# Patient Record
Sex: Female | Born: 1960 | Race: White | Hispanic: No | Marital: Married | Smoking: Former smoker
Health system: Southern US, Community
[De-identification: ages and names within clinical notes are randomized; demographics above are authoritative.]

## PROBLEM LIST (undated history)

## (undated) DIAGNOSIS — K219 Gastro-esophageal reflux disease without esophagitis: Secondary | ICD-10-CM

## (undated) DIAGNOSIS — F32A Depression, unspecified: Secondary | ICD-10-CM

## (undated) DIAGNOSIS — G250 Essential tremor: Secondary | ICD-10-CM

## (undated) DIAGNOSIS — R7303 Prediabetes: Secondary | ICD-10-CM

## (undated) DIAGNOSIS — G8921 Chronic pain due to trauma: Secondary | ICD-10-CM

## (undated) DIAGNOSIS — R569 Unspecified convulsions: Secondary | ICD-10-CM

## (undated) DIAGNOSIS — I10 Essential (primary) hypertension: Secondary | ICD-10-CM

## (undated) DIAGNOSIS — H53009 Unspecified amblyopia, unspecified eye: Secondary | ICD-10-CM

## (undated) DIAGNOSIS — D509 Iron deficiency anemia, unspecified: Secondary | ICD-10-CM

## (undated) DIAGNOSIS — R55 Syncope and collapse: Secondary | ICD-10-CM

## (undated) DIAGNOSIS — E785 Hyperlipidemia, unspecified: Secondary | ICD-10-CM

## (undated) DIAGNOSIS — R2689 Other abnormalities of gait and mobility: Secondary | ICD-10-CM

## (undated) DIAGNOSIS — Z8679 Personal history of other diseases of the circulatory system: Secondary | ICD-10-CM

## (undated) DIAGNOSIS — J101 Influenza due to other identified influenza virus with other respiratory manifestations: Secondary | ICD-10-CM

## (undated) DIAGNOSIS — E039 Hypothyroidism, unspecified: Secondary | ICD-10-CM

## (undated) DIAGNOSIS — M25571 Pain in right ankle and joints of right foot: Secondary | ICD-10-CM

## (undated) DIAGNOSIS — F329 Major depressive disorder, single episode, unspecified: Secondary | ICD-10-CM

## (undated) DIAGNOSIS — M12579 Traumatic arthropathy, unspecified ankle and foot: Secondary | ICD-10-CM

## (undated) DIAGNOSIS — F419 Anxiety disorder, unspecified: Secondary | ICD-10-CM

## (undated) DIAGNOSIS — R0600 Dyspnea, unspecified: Secondary | ICD-10-CM

## (undated) HISTORY — DX: Chronic pain due to trauma: G89.21

## (undated) HISTORY — DX: Essential tremor: G25.0

## (undated) HISTORY — DX: Unspecified convulsions: R56.9

## (undated) HISTORY — DX: Unspecified amblyopia, unspecified eye: H53.009

## (undated) HISTORY — DX: Personal history of other diseases of the circulatory system: Z86.79

## (undated) HISTORY — DX: Major depressive disorder, single episode, unspecified: F32.9

## (undated) HISTORY — DX: Other abnormalities of gait and mobility: R26.89

## (undated) HISTORY — DX: Depression, unspecified: F32.A

## (undated) HISTORY — DX: Hyperlipidemia, unspecified: E78.5

## (undated) HISTORY — DX: Pain in right ankle and joints of right foot: M25.571

---
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
## 1998-12-03 ENCOUNTER — Other Ambulatory Visit: Admission: RE | Admit: 1998-12-03 | Discharge: 1998-12-03 | Payer: Self-pay | Admitting: Family Medicine

## 2001-08-11 DIAGNOSIS — M25571 Pain in right ankle and joints of right foot: Secondary | ICD-10-CM

## 2001-08-11 HISTORY — DX: Pain in right ankle and joints of right foot: M25.571

## 2002-05-04 ENCOUNTER — Inpatient Hospital Stay (HOSPITAL_COMMUNITY): Admission: AC | Admit: 2002-05-04 | Discharge: 2002-05-09 | Payer: Self-pay

## 2002-05-04 ENCOUNTER — Encounter: Payer: Self-pay | Admitting: Emergency Medicine

## 2002-05-05 ENCOUNTER — Encounter: Payer: Self-pay | Admitting: Orthopedic Surgery

## 2003-06-12 HISTORY — PX: ORIF ANKLE FRACTURE: SUR919

## 2004-02-09 HISTORY — PX: ANKLE FUSION: SHX881

## 2004-09-10 ENCOUNTER — Ambulatory Visit: Payer: Self-pay | Admitting: Family Medicine

## 2004-09-19 ENCOUNTER — Ambulatory Visit: Payer: Self-pay | Admitting: Family Medicine

## 2004-09-19 ENCOUNTER — Other Ambulatory Visit: Admission: RE | Admit: 2004-09-19 | Discharge: 2004-09-19 | Payer: Self-pay | Admitting: Internal Medicine

## 2005-04-04 ENCOUNTER — Ambulatory Visit: Payer: Self-pay | Admitting: Family Medicine

## 2006-01-01 ENCOUNTER — Inpatient Hospital Stay (HOSPITAL_COMMUNITY): Admission: RE | Admit: 2006-01-01 | Discharge: 2006-01-02 | Payer: Self-pay | Admitting: Psychiatry

## 2006-01-02 ENCOUNTER — Ambulatory Visit: Payer: Self-pay | Admitting: Psychiatry

## 2006-03-24 ENCOUNTER — Ambulatory Visit: Payer: Self-pay | Admitting: Family Medicine

## 2006-05-04 ENCOUNTER — Encounter: Payer: Self-pay | Admitting: Family Medicine

## 2006-11-05 ENCOUNTER — Ambulatory Visit: Payer: Self-pay | Admitting: Family Medicine

## 2006-11-06 ENCOUNTER — Ambulatory Visit: Payer: Self-pay | Admitting: Family Medicine

## 2006-11-06 LAB — CONVERTED CEMR LAB
BUN: 3 mg/dL — ABNORMAL LOW (ref 6–23)
Basophils Relative: 0.4 % (ref 0.0–1.0)
Bilirubin, Direct: 0.1 mg/dL (ref 0.0–0.3)
CO2: 30 meq/L (ref 19–32)
Cholesterol: 243 mg/dL (ref 0–200)
Direct LDL: 171.8 mg/dL
Eosinophils Relative: 4 % (ref 0.0–5.0)
GFR calc Af Amer: 117 mL/min
Glucose, Bld: 54 mg/dL — ABNORMAL LOW (ref 70–99)
Hemoglobin: 12.7 g/dL (ref 12.0–15.0)
Lymphocytes Relative: 37.8 % (ref 12.0–46.0)
Monocytes Absolute: 0.4 10*3/uL (ref 0.2–0.7)
Monocytes Relative: 8 % (ref 3.0–11.0)
Neutro Abs: 2.4 10*3/uL (ref 1.4–7.7)
Neutrophils Relative %: 49.8 % (ref 43.0–77.0)
Potassium: 3.2 meq/L — ABNORMAL LOW (ref 3.5–5.1)
Sodium: 143 meq/L (ref 135–145)
TSH: 3.67 microintl units/mL (ref 0.35–5.50)
Total Protein: 6.3 g/dL (ref 6.0–8.3)
VLDL: 28 mg/dL (ref 0–40)

## 2006-12-18 ENCOUNTER — Ambulatory Visit: Payer: Self-pay | Admitting: Family Medicine

## 2006-12-18 DIAGNOSIS — E7800 Pure hypercholesterolemia, unspecified: Secondary | ICD-10-CM | POA: Insufficient documentation

## 2006-12-18 DIAGNOSIS — F331 Major depressive disorder, recurrent, moderate: Secondary | ICD-10-CM | POA: Insufficient documentation

## 2007-04-13 ENCOUNTER — Encounter (INDEPENDENT_AMBULATORY_CARE_PROVIDER_SITE_OTHER): Payer: Self-pay | Admitting: *Deleted

## 2007-08-23 ENCOUNTER — Ambulatory Visit: Payer: Self-pay | Admitting: Family Medicine

## 2008-02-17 ENCOUNTER — Ambulatory Visit: Payer: Self-pay | Admitting: Family Medicine

## 2008-02-17 DIAGNOSIS — N3000 Acute cystitis without hematuria: Secondary | ICD-10-CM | POA: Insufficient documentation

## 2008-02-17 LAB — CONVERTED CEMR LAB
Casts: 0 /lpf
Glucose, Urine, Semiquant: NEGATIVE
Mucus, UA: 0
Specific Gravity, Urine: 1.01
Urine crystals, microscopic: 0 /hpf
WBC Urine, dipstick: NEGATIVE
Yeast, UA: 0
pH: 6

## 2008-02-21 ENCOUNTER — Telehealth: Payer: Self-pay | Admitting: Family Medicine

## 2008-07-26 ENCOUNTER — Ambulatory Visit: Payer: Self-pay | Admitting: Family Medicine

## 2008-07-26 LAB — CONVERTED CEMR LAB
Glucose, Urine, Semiquant: NEGATIVE
Specific Gravity, Urine: 1.01
Urobilinogen, UA: 0.2
pH: 7.5

## 2009-06-28 ENCOUNTER — Ambulatory Visit: Payer: Self-pay | Admitting: Family Medicine

## 2009-06-28 ENCOUNTER — Other Ambulatory Visit: Admission: RE | Admit: 2009-06-28 | Discharge: 2009-06-28 | Payer: Self-pay | Admitting: Family Medicine

## 2009-07-04 ENCOUNTER — Encounter (INDEPENDENT_AMBULATORY_CARE_PROVIDER_SITE_OTHER): Payer: Self-pay | Admitting: *Deleted

## 2009-07-04 LAB — CONVERTED CEMR LAB
ALT: 15 units/L (ref 0–35)
AST: 20 units/L (ref 0–37)
Albumin: 4.4 g/dL (ref 3.5–5.2)
Alkaline Phosphatase: 89 units/L (ref 39–117)
Basophils Relative: 0.4 % (ref 0.0–3.0)
Bilirubin, Direct: 0 mg/dL (ref 0.0–0.3)
CO2: 29 meq/L (ref 19–32)
Calcium: 9.3 mg/dL (ref 8.4–10.5)
Creatinine, Ser: 0.7 mg/dL (ref 0.4–1.2)
Eosinophils Relative: 0.9 % (ref 0.0–5.0)
GFR calc non Af Amer: 95.12 mL/min (ref >60)
Hemoglobin: 12.2 g/dL (ref 12.0–15.0)
Lymphocytes Relative: 32.8 % (ref 12.0–46.0)
MCHC: 33.5 g/dL (ref 30.0–36.0)
Monocytes Relative: 6.6 % (ref 3.0–12.0)
Neutro Abs: 3.3 10*3/uL (ref 1.4–7.7)
Neutrophils Relative %: 59.3 % (ref 43.0–77.0)
RBC: 4.09 M/uL (ref 3.87–5.11)
Sodium: 141 meq/L (ref 135–145)
Total CHOL/HDL Ratio: 5
Total Protein: 6.9 g/dL (ref 6.0–8.3)
Triglycerides: 107 mg/dL (ref 0.0–149.0)
VLDL: 21.4 mg/dL (ref 0.0–40.0)
WBC: 5.5 10*3/uL (ref 4.5–10.5)

## 2009-07-09 ENCOUNTER — Encounter (INDEPENDENT_AMBULATORY_CARE_PROVIDER_SITE_OTHER): Payer: Self-pay | Admitting: Internal Medicine

## 2009-07-10 ENCOUNTER — Ambulatory Visit: Payer: Self-pay | Admitting: Family Medicine

## 2009-07-18 ENCOUNTER — Encounter (INDEPENDENT_AMBULATORY_CARE_PROVIDER_SITE_OTHER): Payer: Self-pay | Admitting: Internal Medicine

## 2009-07-18 ENCOUNTER — Encounter (INDEPENDENT_AMBULATORY_CARE_PROVIDER_SITE_OTHER): Payer: Self-pay | Admitting: *Deleted

## 2009-07-31 ENCOUNTER — Emergency Department (HOSPITAL_COMMUNITY): Admission: EM | Admit: 2009-07-31 | Discharge: 2009-07-31 | Payer: Self-pay | Admitting: Family Medicine

## 2009-08-07 DIAGNOSIS — M12579 Traumatic arthropathy, unspecified ankle and foot: Secondary | ICD-10-CM

## 2009-08-07 HISTORY — DX: Traumatic arthropathy, unspecified ankle and foot: M12.579

## 2009-08-09 ENCOUNTER — Ambulatory Visit: Payer: Self-pay | Admitting: Family Medicine

## 2009-08-14 LAB — CONVERTED CEMR LAB
AST: 30 units/L (ref 0–37)
Albumin: 3.7 g/dL (ref 3.5–5.2)
Alkaline Phosphatase: 74 units/L (ref 39–117)
Cholesterol: 224 mg/dL — ABNORMAL HIGH (ref 0–200)
Total Bilirubin: 0.5 mg/dL (ref 0.3–1.2)
Total CHOL/HDL Ratio: 3
VLDL: 21.6 mg/dL (ref 0.0–40.0)

## 2010-02-13 ENCOUNTER — Ambulatory Visit: Payer: Self-pay | Admitting: Family Medicine

## 2010-02-13 LAB — CONVERTED CEMR LAB
ALT: 12 units/L (ref 0–35)
HDL: 62.8 mg/dL (ref >39.00)
Total CHOL/HDL Ratio: 3
Triglycerides: 118 mg/dL (ref 0.0–149.0)
VLDL: 23.6 mg/dL (ref 0.0–40.0)

## 2010-06-25 ENCOUNTER — Ambulatory Visit: Payer: Self-pay | Admitting: Family Medicine

## 2010-06-25 DIAGNOSIS — R42 Dizziness and giddiness: Secondary | ICD-10-CM | POA: Insufficient documentation

## 2010-06-30 DIAGNOSIS — D509 Iron deficiency anemia, unspecified: Secondary | ICD-10-CM | POA: Insufficient documentation

## 2010-06-30 LAB — CONVERTED CEMR LAB
AST: 24 units/L (ref 0–37)
Albumin: 4.6 g/dL (ref 3.5–5.2)
Alkaline Phosphatase: 77 units/L (ref 39–117)
BUN: 8 mg/dL (ref 6–23)
Basophils Absolute: 0 10*3/uL (ref 0.0–0.1)
Chloride: 103 meq/L (ref 96–112)
Creatinine, Ser: 0.8 mg/dL (ref 0.4–1.2)
Eosinophils Relative: 1.4 % (ref 0.0–5.0)
Glucose, Bld: 73 mg/dL (ref 70–99)
Lymphocytes Relative: 35 % (ref 12.0–46.0)
Monocytes Relative: 9.7 % (ref 3.0–12.0)
Neutrophils Relative %: 53.5 % (ref 43.0–77.0)
Platelets: 281 10*3/uL (ref 150.0–400.0)
Potassium: 3.3 meq/L — ABNORMAL LOW (ref 3.5–5.1)
RDW: 18 % — ABNORMAL HIGH (ref 11.5–14.6)
Total Protein: 6.8 g/dL (ref 6.0–8.3)
WBC: 5.2 10*3/uL (ref 4.5–10.5)

## 2010-07-01 ENCOUNTER — Ambulatory Visit: Payer: Self-pay | Admitting: Internal Medicine

## 2010-07-01 LAB — CONVERTED CEMR LAB
Folate: 5.9 ng/mL
Iron: 17 ug/dL — ABNORMAL LOW (ref 42–145)
Potassium: 4.6 meq/L (ref 3.5–5.1)
Vitamin B-12: 210 pg/mL — ABNORMAL LOW (ref 211–911)

## 2010-07-12 ENCOUNTER — Encounter: Payer: Self-pay | Admitting: Family Medicine

## 2010-07-23 ENCOUNTER — Ambulatory Visit: Payer: Self-pay | Admitting: Family Medicine

## 2010-07-23 LAB — CONVERTED CEMR LAB
Ketones, urine, test strip: NEGATIVE
Nitrite: NEGATIVE
Urobilinogen, UA: 0.2

## 2010-07-24 ENCOUNTER — Encounter: Payer: Self-pay | Admitting: Family Medicine

## 2010-07-30 ENCOUNTER — Ambulatory Visit: Payer: Self-pay | Admitting: Family Medicine

## 2010-07-31 ENCOUNTER — Encounter: Payer: Self-pay | Admitting: Family Medicine

## 2010-07-31 LAB — CONVERTED CEMR LAB: Fecal Occult Bld: NEGATIVE

## 2010-08-28 ENCOUNTER — Encounter: Payer: Self-pay | Admitting: Family Medicine

## 2010-09-10 NOTE — Miscellaneous (Signed)
Summary: Plan/[facility]  Plan/[facility]   Imported By: Lester Haworth 07/18/2010 11:04:17  _____________________________________________________________________  External Attachment:    Type:   Image     Comment:   External Document

## 2010-09-10 NOTE — Consult Note (Signed)
Summary: Dr.Catherine [facility] Neurolog[facility],Note  Dr.Catherine [facility] Neurologic,Note   Imported By: Beau Fanny 07/01/2010 15:27:52  _____________________________________________________________________  External Attachment:    Type:   Image     Comment:   External Document

## 2010-09-10 NOTE — Assessment & Plan Note (Signed)
Summary: TRANSFER FROM BILLIE/DIZZY/CLE   Vital Signs:  Patient profile:   50 year old female Height:      65.5 inches Weight:      199.50 pounds BMI:     32.81 Temp:     98.3 degrees F oral Pulse rate:   84 / minute Pulse (ortho):   84 / minute Pulse rhythm:   regular BP sitting:   100 / 80  (left arm) BP standing:   100 / 80 Cuff size:   large  Vitals Entered By: Selena Batten Dance CMA Duncan Dull) (June 25, 2010 3:31 PM)  Serial Vital Signs/Assessments:  Time      Position  BP       Pulse  Resp  Temp     By 3:33 PM   Lying LA  110/82   84                    Kim Dance CMA (AAMA) 3:33 PM   Sitting   102/80   84                    Kim Dance CMA (AAMA) 3:33 PM   Standing  100/80   84                    Kim Dance CMA (AAMA)  CC: Balance issues Comments Falling and stumbling a lot per patient   History of Present Illness: CC: imbalance  dysequilibrium - leading to falls.  Losing balance.  has fallen 4 times in last week.  Today fell at home.  finished doing hair, turned around and lost balance.  no injury, no LOC.  No vertigo, lightheaded/passing out.  No HA.  No fevers/chills.  No abd pain, n/v/d, pain anywhere.  No CP/SOB.  h/o migraines, not anymore.  happened 4 years ago, similar episode, s/p MRIs which were normal at [facility] (not in [place].)  Saw neurologist and told all normal.  doesn't remember who.  h/o R trimalleolar fracture 2003, slightly unsteady since then.  Has been through physical therapy for ankle in past.  on disability for this.  psych - Dr. [name] in [place]. pain - Dr. Murray Hodgkins.  oxycontin 10mg  3 a day.  Current Medications (verified): 1)  Wellbutrin Sr 150 Mg Tb12 (Bupropion Hcl) .... Take 3 By Mouth Daily 2)  Valium 10 Mg Tabs (Diazepam) .... Up To 4  Per Day 3)  Oxycontin 10 Mg Tb12 (Oxycodone Hcl) .... 3 Per Day 4)  Lamictal 150 Mg Tabs (Lamotrigine) .... Take One By Mouth At Bedtime 5)  Calcium Carbonate 600 Mg Tabs (Calcium Carbonate) .Marland Kitchen.. 1 Twice A Day 6)  Aspirin  81 Mg Tbec (Aspirin) .Marland Kitchen.. 1 Daily 7)  Abilify 30 Mg Tabs (Aripiprazole) .... One Tablet At Bedtime 8)  Simvastatin 40 Mg Tabs (Simvastatin) .... Take 1 Each Night For Cholesterol 9)  Prozac 20 Mg Caps (Fluoxetine Hcl) .... 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  NAD Mouth:  Oral mucosa and oropharynx without lesions or exudates.  MMM Neck:  no masses, no thyromegaly. Lungs:  normal respiratory effort, no intercostal retractions, no accessory muscle use, and normal breath sounds.   Heart:  normal rate, regular rhythm, and no murmur.   Neurologic:  CN 2-12 intact, sensation, strength intact.  FTN and HTS intact.  brisk 2+ DTRs bilaterally.  imbalanced gait.  negative romberg, but mild unsteadiness. no pronater drift.  + significant unsteadiness with tandem gait, less with feet side by side but still somewhat present. Skin:  turgor normal, color normal, and no rashes.   Psych:  interactive.  blunted, flat affect.     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  check basic blood work.  given extensive w/u in past, doubt will need rpt MRI.  will try to review old chart ot see what has been done.  RTC 3 wks for f/u.  Discussed how on significant amt psychotropic medications which could  possibly cause dysequilibrium.  advised to call psych Dr. [name] to see if appropriate to try off some meds esp valium.  advised to minimize valium (anxiety) and oxycontin (ankle pain).  set up with balance training PT, ambulation device fitting to see if we can minimize falls while work on getting dysequilibrium improved.    Orders: Physical Therapy Referral (PT) TLB-BMP (Basic Metabolic Panel-BMET) (80048-METABOL) TLB-TSH (Thyroid Stimulating Hormone) (84443-TSH) TLB-Hepatic/Liver Function Pnl (80076-HEPATIC) TLB-CBC Platelet - w/Differential (85025-CBCD)  Complete Medication List: 1)  Wellbutrin Sr 150 Mg Tb12 (Bupropion hcl) .... Take 3 by mouth daily 2)  Valium 10 Mg Tabs (Diazepam) .... Up to 4  per day 3)  Oxycontin 10 Mg Tb12 (Oxycodone hcl) .... 3 per day 4)  Lamictal 150 Mg Tabs (Lamotrigine) .... Take one by mouth at bedtime 5)  Calcium Carbonate 600 Mg Tabs (Calcium carbonate) .Marland Kitchen.. 1 twice a day 6)  Aspirin 81 Mg Tbec (Aspirin) .Marland Kitchen.. 1 daily 7)  Abilify 30 Mg Tabs (Aripiprazole) .... One tablet at bedtime 8)  Simvastatin 40 Mg Tabs (Simvastatin) .... Take 1 each night for cholesterol 9)  Prozac 20 Mg Caps (Fluoxetine hcl) .... 3 by mouth every morning  Patient Instructions: 1)  I think your dysequilibrium is due partly to the ankles as well as some of the medicines you're on. 2)  We will refer you to physical therapy for balance training and to see if you'd benefit from any ambulation assistance device. 3)  Try to use minimal valium and oxycontin. 4)  Consider calling Dr. [name] for sooner appointment to see if there are any changes that can be made to your depression regimen to help with dysequilibrium.   Orders Added: 1)  Physical Therapy Referral [PT] 2)  TLB-BMP (Basic Metabolic Panel-BMET) [80048-METABOL] 3)  TLB-TSH (Thyroid Stimulating Hormone) [84443-TSH] 4)  TLB-Hepatic/Liver Function Pnl [80076-HEPATIC] 5)  TLB-CBC Platelet - w/Differential [85025-CBCD] 6)  Est.  Patient Level IV [[address]]    Current Allergies (reviewed today): No known allergies   Appended Document: old chart input reviewed old chart, C/S from neuro, asked to scan into system.  No records of MRI in system.  would want to obtain records from neuro.  will request records from neuro. [name]  MD  June 30, 2010 5:33 PM   Clinical Lists Changes  Observations: Added new observation of NEUROLOGY MD: Orlin Hilding (06/30/2010 17:15) Added new observation of PSYCHIATRYMD: Kaur (06/30/2010 17:15) Added  new observation of ORTHOPEDMD: Renae Fickle (06/30/2010 17:15)       Habits & Providers     Neurologist: Orlin Hilding     Orthopedist: Renae Fickle     Psychiatrist: [name]  Appended Document: TRANSFER FROM BILLIE/DIZZY/CLE sent for physical therapy and fall prevention program.

## 2010-09-12 NOTE — Miscellaneous (Signed)
Summary: iFOB entry  Clinical Lists Changes  Observations: Added new observation of HEMOCULTDUE: 08/2012 (07/31/2010 8:18) Added new observation of HEMOCCULT: negative (07/30/2010 8:19)      Preventive Care Screening  Hemoccult:    Date:  07/30/2010    Next Due:  08/2012    Results:  negative     Next due at 50 y/o (12-2011)

## 2010-09-12 NOTE — Assessment & Plan Note (Signed)
Summary: 4WK FOLLOW UP / LFW   Vital Signs:  Patient profile:   50 year old female Weight:      204.75 pounds Temp:     98.6 degrees F oral Pulse rate:   84 / minute Pulse rhythm:   regular BP sitting:   116 / 74  (left arm) Cuff size:   large  Vitals Entered By: Selena Batten Dance CMA Duncan Dull) (July 23, 2010 3:13 PM) CC: 4 week follow up and ? UTI Comments Cloudy urine with frequent urination   History of Present Illness: CC: ? UTI, f/u imbalance.  imbalance - PT helping some.  No more near falls or falls.  using cane constantly.  anemia - found to have Hgb 10.6, B12 210, iron low.  on iron and B12 supplements.  not noticing much improvement yet in fatigue.  Periods - since out of work, periods skip, some months heavy, other months light.  No other bleeding elsewhere.  No stool changes.  No weight changes (gained 2 lbs).  Appetite ok.  UTI - feels like starting to get UTI.  increased frequency, cloudy.  No dysuria or urgency.  No abd pain, f/c/n/v, back pain.    depression - hasn't seen Dr. [name] recently.  no recent changes in meds.  Current Medications (verified): 1)  Aspirin 81 Mg Tbec (Aspirin) .Marland Kitchen.. 1 Daily 2)  Oxycontin 10 Mg Tb12 (Oxycodone Hcl) .... 3 Per Day 3)  Wellbutrin Sr 150 Mg Tb12 (Bupropion Hcl) .... Take 3 By Mouth Daily 4)  Prozac 20 Mg Caps (Fluoxetine Hcl) .... 3 By Mouth Every Morning 5)  Abilify 30 Mg Tabs (Aripiprazole) .... One Tablet At Bedtime 6)  Valium 10 Mg Tabs (Diazepam) .... Up To 4  Per Day (Average 2) 7)  Lamictal 150 Mg Tabs (Lamotrigine) .... Take One By Mouth At Bedtime 8)  Simvastatin 40 Mg Tabs (Simvastatin) .... Take 1 Each Night For Cholesterol 9)  Vitamin B-12 1000 Mcg Tabs (Cyanocobalamin) .... Take One Daily 10)  Ferrous Sulfate 325 (65 Fe) Mg Tabs (Ferrous Sulfate) .... Take One Twice Daily 11)  Calcium Carbonate 600 Mg Tabs (Calcium Carbonate) .Marland Kitchen.. 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  1 sister with anemia  No CA, DM, CVA  Social History: no smoking, no EtOH, no rec drugs caffeine: no Marital Status: divorced Children: 1 Occupation: on Sports administrator at [employer]  Review of Systems       per HPI  Physical Exam  General:  alert, well-developed, well-nourished, well-hydrated, and overweight-appearing.  NAD Lungs:  normal respiratory effort, no intercostal retractions, no accessory muscle use, and normal breath sounds.   Heart:  normal rate, regular rhythm, and no murmur.   Abdomen:  Bowel sounds positive,abdomen soft and non-tender without masses, organomegaly or hernias noted.  no CVA tenderness Pulses:  2+ radpulses Extremities:  no pedal edema Psych:  interactive.  blunted, flat affect.     Impression & Recommendations:  Problem # 1:  CYSTITIS, ACUTE (ICD-595.0) UA and micro consistent with UTI.  per patient,  3 d courses not enough.  sent in 7 day course.  Encouraged to push clear liquids, get enough rest, and take acetaminophen as needed. To be seen in 10 days if no improvement, sooner if worse.  Her updated medication list for this problem includes:    Ciprofloxacin Hcl 500 Mg Tabs (Ciprofloxacin hcl) .Marland Kitchen... Take one twice daily for 7 days   Orders: UA Dipstick W/ Micro (manual) ([address]) Specimen Handling ([address]) T-Culture, Urine (60454-09811)  Problem # 2:  ANEMIA, IRON DEFICIENCY (ICD-280.9)  iFOB today.  likely from periods.  continue B12 and FeSO4.  RTC  1-2 mo for CBC recheck. Her updated medication list for this problem includes:    Vitamin B-12 1000 Mcg Tabs (Cyanocobalamin) .Marland Kitchen... Take one daily    Ferrous Sulfate 325 (65 Fe) Mg Tabs (Ferrous sulfate) .Marland Kitchen... 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  discussed with aptient 2 options of referral back to neuro for f/u vs watchful waiting.  likely multifactorial.  (h/o ankle fracture decreased proprioception, multiple psychotropics, narcotic, benzo, ).  reviewed old chart - scanned neuro note, no records of MRI.  could consider weaning off some meds esp valium.  advised to minimize valium (anxiety) and oxycontin (ankle pain).  Problem # 4:  HYPOKALEMIA (ICD-276.8) resolved.  Complete Medication List: 1)  Aspirin 81 Mg Tbec (Aspirin) .Marland Kitchen.. 1 daily 2)  Oxycontin 10 Mg Tb12 (Oxycodone hcl) .... 3 per day 3)  Wellbutrin Sr 150 Mg Tb12 (Bupropion hcl) .... Take 3 by mouth daily 4)  Prozac 20 Mg Caps (Fluoxetine hcl) .... 3 by mouth every morning 5)  Abilify 30 Mg Tabs (Aripiprazole) .... One tablet at bedtime 6)  Valium 10 Mg Tabs (Diazepam) .... Up to 4  per day (average 2) 7)  Lamictal 150 Mg Tabs (Lamotrigine) .... Take one by mouth at bedtime 8)  Simvastatin 40 Mg Tabs (Simvastatin) .... Take 1 each night for cholesterol 9)  Vitamin B-12 1000 Mcg Tabs (Cyanocobalamin) .... Take one daily 10)  Ferrous Sulfate 325 (65 Fe) Mg Tabs (Ferrous sulfate) .... Take one twice daily 11)  Calcium Carbonate 600 Mg Tabs (Calcium carbonate) .Marland Kitchen.. 1 twice a day 12)   Ciprofloxacin Hcl 500 Mg Tabs (Ciprofloxacin hcl) .... Take one twice daily for 7 days  Patient Instructions: 1)  Looks like urinary infection.  treat with cipro 500mg  twice daily for 3 days. 2)  Push fluids, cranberry juice. 3)  If not better, or if any fevers,chills, worsening, let us know. 4)  I'm glad the cane is helping you walk. 5)  Please return in 1 month for recheck blood work  CBC, 280.9 6)  complete stool kit to ensure no blood in stool. Prescriptions: CIPROFLOXACIN HCL 500 MG TABS (CIPROFLOXACIN HCL) take one twice daily for 7 days  #14 x 0   Entered and Authorized by:   [name]  MD   Signed by:   [name]  MD on 07/23/2010   Method used:   Electronically to        [facility]* (retail)  [address]       [place], [place]  [address]       Ph: [phone]       Fax: [phone]   RxID:   [id number] CIPROFLOXACIN HCL 500 MG TABS (CIPROFLOXACIN HCL) take one twice daily for 3 days  #6 x 0   Entered and Authorized by:   [name]  MD   Signed by:   [name]  MD on 07/23/2010   Method used:   Electronically to        [facility]* (retail)       [address]       [place], [place]  [address]       Ph: [phone]       Fax: [phone]   RxID:   [id number]    Orders Added: 1)  UA Dipstick W/ Micro (manual) [[address]] 2)  Specimen Handling [[address]] 3)  T-Culture, Urine [[id number]] 4)  Est. Patient Level IV [[address]]    Current Allergies (reviewed today): No known allergies   Laboratory Results   Urine Tests  Date/Time Received: July 23, 2010  3:21 PM  Date/Time Reported: July 23, 2010 3:21 PM   Routine Urinalysis   Color: straw Appearance: Cloudy Glucose: negative   (Normal Range: Negative) Bilirubin: negative   (Normal Range: Negative) Ketone: negative   (Normal Range: Negative) Spec. Gravity: 1.025   (Normal Range: 1.003-1.035) Blood: large   (Normal Range: Negative) pH: 5.0   (Normal Range:  5.0-8.0) Protein: 30   (Normal Range: Negative) Urobilinogen: 0.2   (Normal Range: 0-1) Nitrite: negative   (Normal Range: Negative) Leukocyte Esterace: large   (Normal Range: Negative)  Urine Microscopic WBC/HPF: TNTC RBC/HPF: 5-10 Bacteria/HPF: 1+ rods Mucous/HPF: no Epithelial/HPF: rare Crystals/HPF: no Casts/LPF: no Yeast/HPF: no    Comments: read by ....................[name]  MD  July 23, 2010 3:49 PM  UCx sent

## 2010-09-12 NOTE — Letter (Signed)
Summary: [facility]: Immunoassay Fecal Occult Blood (iFOB) Order Form  [place] [facility]  [address] [place], [place] [address]   Phone: [phone]  Fax: [phone]      [facility]: Immunoassay Fecal Occult Blood (iFOB) Order Form   July 23, 2010 MRN: [id number]   Jamie Patterson September 06, 1960   Physicican Name:_________________________  Diagnosis Code:________IDA__________________      [name]  MD

## 2010-09-18 NOTE — Miscellaneous (Signed)
Summary: PT [facility] D/C from balance training program - some improvement  Discharged from PT/[facility]   Imported By: Sherian Rein 09/10/2010 12:55:02  _____________________________________________________________________  External Attachment:    Type:   Image     Comment:   External Document  Appended Document: PT [facility] D/C from balance training program - some improvement    Clinical Lists Changes  Observations: Added new observation of PAST MED HX: Hyperlipidemia Depression chronic pain from ankle fracture Imbalance likely multifactorial      s/p balance training program [facility] 08/2010 (09/10/2010 13:06)        Past History:  Past Medical History: Hyperlipidemia Depression chronic pain from ankle fracture Imbalance likely multifactorial      s/p balance training program [facility] 08/2010

## 2010-12-12 ENCOUNTER — Other Ambulatory Visit: Payer: Self-pay

## 2010-12-12 MED ORDER — FERROUS SULFATE 325 (65 FE) MG PO TBEC: 325.0000 mg | DELAYED_RELEASE_TABLET | Freq: Two times a day (BID) | ORAL | Status: DC

## 2010-12-12 NOTE — Telephone Encounter (Signed)
Pt needs to call for appt. 

## 2010-12-24 ENCOUNTER — Encounter: Payer: Self-pay | Admitting: Family Medicine

## 2010-12-25 ENCOUNTER — Encounter: Payer: Self-pay | Admitting: Family Medicine

## 2010-12-25 ENCOUNTER — Ambulatory Visit (INDEPENDENT_AMBULATORY_CARE_PROVIDER_SITE_OTHER): Payer: Medicare Other | Admitting: Family Medicine

## 2010-12-25 VITALS — BP 124/80 | HR 76 | Temp 98.3°F | Wt 209.1 lb

## 2010-12-25 DIAGNOSIS — R350 Frequency of micturition: Secondary | ICD-10-CM

## 2010-12-25 DIAGNOSIS — D509 Iron deficiency anemia, unspecified: Secondary | ICD-10-CM

## 2010-12-25 DIAGNOSIS — N3000 Acute cystitis without hematuria: Secondary | ICD-10-CM

## 2010-12-25 DIAGNOSIS — R42 Dizziness and giddiness: Secondary | ICD-10-CM

## 2010-12-25 LAB — POCT URINALYSIS DIPSTICK
Bilirubin, UA: NEGATIVE
Ketones, UA: NEGATIVE
pH, UA: 6.5

## 2010-12-25 LAB — CBC WITH DIFFERENTIAL/PLATELET
Eosinophils Relative: 0 % (ref 0.0–5.0)
Monocytes Relative: 9.1 % (ref 3.0–12.0)
Neutrophils Relative %: 77 % (ref 43.0–77.0)
Platelets: 199 10*3/uL (ref 150.0–400.0)
WBC: 7.9 10*3/uL (ref 4.5–10.5)

## 2010-12-25 LAB — IBC PANEL
Iron: 41 ug/dL — ABNORMAL LOW (ref 42–145)
Saturation Ratios: 10.7 % — ABNORMAL LOW (ref 20.0–50.0)
Transferrin: 274.7 mg/dL (ref 212.0–360.0)

## 2010-12-25 LAB — FERRITIN: Ferritin: 44.9 ng/mL (ref 10.0–291.0)

## 2010-12-25 MED ORDER — CIPROFLOXACIN HCL 500 MG PO TABS: 500.0000 mg | ORAL_TABLET | Freq: Two times a day (BID) | ORAL | Status: AC

## 2010-12-25 NOTE — Patient Instructions (Signed)
Looks like UTI. Push fluids and rest. Take cipro twice daily for 7days. If not improving as expected give [place] a call. We have checked blood work today to follow iron and anemia.

## 2010-12-25 NOTE — Progress Notes (Signed)
  Subjective:    Patient ID: Jamie Patterson, female    DOB: 1961-03-12, 50 y.o.   MRN: [id number]  HPI CC: UTI?  5d h/o frequency, cloudy urine, some pain with urination but not necessarily dysuria, urgency.  No abd pain, back pain, f/c/n/v.  On azo and cranberry  Last UTI 08/2009 >100k Ecoli S bactrim, cipro.  Also h/o IDA - on iron takes regularly twice daily.  Wonders if needs to continue taking.   Also seen for dysequilibrium last year, sent to [facility] for PT, states significantly improved, using cane when feels imbalanced.  Falls decreased in frequency.  Review of Systems Per HPI    Objective:   Physical Exam  Vitals reviewed. Constitutional: She appears well-developed and well-nourished. No distress.  HENT:  Head: Normocephalic and atraumatic.  Mouth/Throat: Oropharynx is clear and moist. No oropharyngeal exudate.  Cardiovascular: Normal rate, regular rhythm, normal heart sounds and intact distal pulses.   No murmur heard. Pulmonary/Chest: Effort normal and breath sounds normal. No respiratory distress. She has no wheezes.  Abdominal: Soft. Bowel sounds are normal. She exhibits no distension. There is no tenderness.       No CVA tenderness  Musculoskeletal: She exhibits no edema.  Skin: Skin is warm and dry. No rash noted.  Psychiatric: She has a normal mood and affect.          Assessment & Plan:

## 2010-12-25 NOTE — Assessment & Plan Note (Addendum)
UA consistent with UTI. Treat with 7d course cipro (per pt needs longer course) Sent culture.

## 2010-12-25 NOTE — Assessment & Plan Note (Signed)
Found to have IDA.  Reports compliance with bid iron dosing. iFOB negative.  Not heavy bleeding from periods. Check blood work today, update on whether needs to continue iron supplements or not.

## 2010-12-25 NOTE — Assessment & Plan Note (Signed)
Much improved after [facility] PT therapy

## 2010-12-27 NOTE — Discharge Summary (Signed)
NAMESHENIAH, SUPAK                         ACCOUNT NO.:  [id number]   MEDICAL RECORD NO.:  [id number]                   PATIENT TYPE:  INP   LOCATION:  5010                                 FACILITY:  [facility]   PHYSICIAN:  Anise Salvo. Clark, P.A.-C.              DATE OF BIRTH:  1961-02-22   DATE OF ADMISSION:  05/04/2002  DATE OF DISCHARGE:                                 DISCHARGE SUMMARY   DISCHARGE DIAGNOSIS:  Bimalleolar right ankle fracture and right calcaneal  fracture status post fall.   DISCHARGE DIAGNOSIS:  Bimalleolar right ankle fracture and right calcaneal  fracture status post open reduction and internal fixation of the right ankle  plus a left foot third foot proximal phalanx fracture.   SUMMARY:  The patient suffered a fall while at work on May 04, 2002  and was brought to [facility] emergency room by EMS and was  admitted for pain control and stabilization prior to a planned open  reduction and internal fixation of her ankle on the following day,  On  May 05, 2002 she was taken to the operating room where her right  bimalleolar ankle fracture  was repaired.  The distal fibula was repaired  with a Synthes DCP plate and screws, a Dow Miles cable and some bone graft.  The tibial plafond was repaired with 4.5 cannulated screw.  The medial  malleolus was repaired with both 3.5 and 4.5 cannulated screws.  She had a  tourniquet time of 2 hours and 3 minutes; this was performed under general  anesthesia.  Estimated blood loss was 100 cc.  She was taken to the PACU in  stable condition.  On postoperative day one, May 06, 2002 she was in  moderate pain and the morphine was not working well.  She reported that the  Percocet was more effective.  She was having a lot of cramping and spasms in  her leg.  Her vital signs were stable and she was afebrile. The right leg  was intact neurovascularly.  The splint was clean, dry and intact.  The left  forefoot  third toe was ecchymotic over the MTP joint and proximal phalanx  and slightly tender.  Her left low/mid back and flank were ecchymotic and  tender.   LABORATORY DATA:  White count 8.2, hemoglobin 7.9, hematocrit 23.3, platelet  count 221,000.  Her chemistry panel was normal except for a calcium slightly  low at 7.9.  Potassium was borderline at 3.5.   X-rays were reviewed of the left middle toe and showed a proximal phalanx  fracture which was oblique an non-displaced.  On the thoracolumbar films  there was some slight spurring anterior and posterior of vertebral bodies,  but there was no evidence of acute fractures.   The plan was to start her on potassium chloride 20 mEq once a day, put her  on OxyContin 20 mg one q.12h,  discontinue the PCA, write for Dilaudid IV 2  to 3 mg for breakthrough. We wanted her working with the incentive device  and trying coughing and deep breathing.  She was typed and crossed and  transfused two units of blood.  She was given diazepam 5 mg q.8h for  spasming.  On May 07, 2002, postoperative day two, she was taking  fluids well.  The Foley was in place with good drainage.  She was feeling a  lot better after the transfusion and more energetic.  Her vital signs were  stable and she was afebrile.  The back bruise was starting to fade a little  bit.  The splint was intact.  The lower extremity was intact  neurovascularly.  She was allowed out of bed non-weight bearing on the right  foot.  On postoperative day three, May 08, 2002, she was having some  ankle pain.  She had been up in the chair the day before and she was  otherwise doing well. Her temperature maximum was 99.4.  The posterior  splint was intact.  She was neurovascularly intact.  The Foley was  discontinued, the IV was discontinued and she was placed on strictly p.o.  pain control.  The plan was for physical therapy with a plan for sending her  home.  Standard walker and a three in  one were ordered.  On May 09, 2002, postoperative day four, she was resting comfortably in a chair.  She  had been getting around well.  She had been non-weight bearing on her right  foot.  She had minimal pain from her left toe.  She felt ready to go home.  Her vital signs were stable.  Her temperature maximum was 99.4.  Her right  lower extremity is intact neurovascularly and the splint was intact, clean  and dry. The left toe was ecchymotic but still intact neurovascularly and  mildly tender.  The plan was to discharge her home.   ACTIVITY:  She was to continue non-weight bearing on her right foot,  ambulating with a standard walker, ice and elevation to the right foot and  the left foot and weight bearing on the left heel only.   DIET:  Regular.   WOUND CARE:  Keep the splint clean and dry, ice and elevate as needed for  pain or swelling.   SPECIAL INSTRUCTIONS:  Call for increasing pain more drainage or bleeding  from the right foot, any numbness or tingling, any coughing or shortness of  breath and if fever greater than 100.5 or chills.   DISCHARGE MEDICATIONS:  1. Atenolol 100 mg one p.o. q.d.  2. Wellbutrin 200 mg one p.o. b.i.d.  3. Effexor 75 mg two p.o. q.a.m. and one p.o. q.p.m.  4. OxyContin 30 mg one p.o. b.i.d., #10 with no refills, followed by     OxyContin 20 mg one p.o. b.i.d., #10 with no refills.  5. Percocet 325/5 mg one to two p.o. q.4 to 6h p.r.n. breakthrough pain, #40     with no refills.  6. Phenergan 25 mg one p.o. q.6h p.r.n. nausea, #30 with no refills.  7. Robaxin 750 mg one p.o. q.6h p.r.n. spasm, #40 with two refills.  8. Benadryl over the counter 25 mg one or two q.8h p.r.n. itching.  9. Laxative of choice as directed per label.   FOLLOW UP:  Follow-up on Friday, May 20, 2002.  She was instructed to  call for an appointment.   CONDITION ON  DISCHARGE:  Stable.                                              Anise Salvo. Chestine Spore, P.A.-C.     ARC/MEDQ  D:  05/09/2002  T:  05/11/2002  Job:  [id number]

## 2010-12-27 NOTE — H&P (Signed)
NAMEMIOSHA, BEHE                           ACCOUNT NO.:  [id number]   MEDICAL RECORD NO.:  [id number]                   PATIENT TYPE:   LOCATION:                                       FACILITY:  [facility]   PHYSICIAN:  Deidre Ala, M.D.                 DATE OF BIRTH:  1961-05-28   DATE OF ADMISSION:  05/04/2002  DATE OF DISCHARGE:                                HISTORY & PHYSICAL   HISTORY OF PRESENT ILLNESS:  The patient was brought to [facility] Emergency  Room on May 04, 2002, via EMS after suffering a fall at work.  She  fell down a flight of stairs suffering an injury to her right ankle.  She  had immediate pain, deformity, and inability to bear weight.  It started  swelling almost immediately.  Her ankle was splinted, she was stabilized and  brought to the emergency room in stable condition.   PAST AND PRESENT MEDICAL HISTORY:  1. Hypertension controlled with medication.  2. History of migraines.  3. History of depression.   PRESENT MEDICATIONS:  1. Atenolol 100 mg one p.o. q.d.  2. Wellbutrin 200 mg one p.o. b.i.d.  3. Effexor 75 mg two p.o. q.a.m. and one p.o. q.p.m.  4. Ambien 5 mg one-half p.o. q.h.s.   ALLERGIES:  No known drug allergies.   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   REVIEW OF SYSTEMS:  RESPIRATORY:  She denies any congestion, coughing,  shortness of breath.  CARDIOVASCULAR:  Denies chest pain.  GI:  Denies  nausea, vomiting or diarrhea.   PHYSICAL EXAMINATION:  GENERAL:  She is a well-developed, well nourished  adult female who appears to be in no acute distress.  VITAL SIGNS:  Pulse 113, respirations 18, blood pressure 136/98.  HEENT:  Head is normocephalic, atraumatic.  Pupils equal, round, reactive to  light and accommodation.  Extraocular muscles intact.  Mucous membranes pink  and moist.  NECK:  Supple without adenopathy.  CHEST:  Clear to  auscultation.  HEART:  Regular rhythm and rate.  Normal S1 and S2 without murmurs.  ABDOMEN:  Soft and nontender.  Normal active bowel sounds x4.  EXTREMITIES:  Right ankle was neurovascularly intact with a palpable  dorsalis pedal pulse.  Capillary refill at 2 seconds.  The ankle has been  reduced and is in a sugar tong splint at the time of this evaluation.   X-RAYS:  Shows a dislocated bimalleolar right ankle fracture and a tri  planar plafond fracture of the right os calcis.   IMPRESSION:  Bimalleolar right ankle fracture and right calcaneal fracture.   PLAN:  To admit her overnight for pain control and plan for open reduction  internal fixation of her right ankle fracture tomorrow.     Hessie Diener  Maia Breslow, P.A.-C.                    Deidre Ala, M.D.    ARC/MEDQ  D:  05/04/2002  T:  05/06/2002  Job:  [id number]

## 2010-12-27 NOTE — H&P (Signed)
   Jamie Patterson, Jamie Patterson                         ACCOUNT NO.:  [id number]   MEDICAL RECORD NO.:  [id number]                   PATIENT TYPE:  INP   LOCATION:  1824                                 FACILITY:  [facility]   PHYSICIAN:  Deidre Ala, M.D.                 DATE OF BIRTH:  [birth year]   DATE OF ADMISSION:  05/04/2002  DATE OF DISCHARGE:                                HISTORY & PHYSICAL   The patient was brought to [facility] emergency room on May 04, 2002 via EMS after suffering a fall down a flight of stairs at work.  She suffered an injury to her right ankle and had immediate, swelling,  deformity and inability to bear weight.  She was stabilized.  She was  brought to the emergency room in stable condition.     Anise Salvo. Chestine Spore, P.A.-C.                    Deidre Ala, M.D.    ARC/MEDQ  D:  05/04/2002  T:  05/06/2002  Job:  [id number]

## 2010-12-27 NOTE — Op Note (Signed)
Jamie Patterson, Jamie Patterson                         ACCOUNT NO.:  [id number]   MEDICAL RECORD NO.:  [id number]                   PATIENT TYPE:  INP   LOCATION:  5725                                 FACILITY:  [facility]   PHYSICIAN:  Deidre Ala, M.D.                 DATE OF BIRTH:  09-11-1960   DATE OF PROCEDURE:  05/05/2002  DATE OF DISCHARGE:                                 OPERATIVE REPORT   PREOPERATIVE DIAGNOSES:  1. Completely displaced right ankle bimalleolar fracture.  2. Tibial plafond fracture, displaced, triplane type.  3. Incidental tuberosity of os calcis fracture, nondisplaced.   POSTOPERATIVE DIAGNOSES:  1. Completely displaced right ankle bimalleolar fracture.  2. Tibial plafond fracture, displaced, triplane type.  3. Incidental tuberosity of os calcis fracture, nondisplaced.   PROCEDURES:  1. Open reduction and internal fixation of tibial plafond fracture.  2. Open reduction and internal fixation of bimalleolar ankle fracture with     allograft bone grafting of comminuted fibula and cerclage 1.6 Dall-Miles     cable.   SURGEON:  Bradley Ferris, M.D.   ASSISTANT:  Anise Salvo. Clark, P.A.-C.   ANESTHESIA:  General endotracheal.   CULTURES:  None.   DRAINS:  None.   ESTIMATED BLOOD LOSS:  350 cc.   REPLACEMENT:  Without.   TOURNIQUET TIME:  2 hours and 3 minutes.   PATHOLOGIC FINDINGS AND HISTORY:  The patient is a Diplomatic Services operational officer at [employer] who fell coming down some metal steps sustaining this eversion  injury.  The ankle was displaced but closed, somewhat tented medially.  We  reduced her in the ER in a Quigley type fashion with plantar flexion  inversion and placed her in well-padded splints.  Today in the operating  room, the ankle was moderately but not excessively swollen medially.  She  did have a fracture blister over the area of tenting.  The skin was not  broken down or compromised but there was a fracture blister there.  We were  able to  make our incision distal to the site.  We did a very meticulous  closure at the end somewhat loose and we then fixed her with three medial  malleolar type 4.5 cannulated cancellous screws and one 3.5 for the  posterior corner.  It was a very small fragment and had the ligament on it.  I then repaired the deltoid ligament also taking a tuck in it medially.  The  plafond fracture was affixed with two 4.5 cannulated cancellous screws  slightly obliquely and the fibula with a contour Recon-type DCP plate with  distal buttress with cancellous screws x two and the cerclage band at the  fracture site to hold bone fragments and comminution, spanning two holes  open at the fracture site there where she was comminuted, and bone grafted  there.  One fragment we could not additionally capture with an additional  Dall-Miles 1.6.  However the mortise was reconstituted.  The distal tib-fib  ligament was on the plafond fragment and that held the tibia and fibular  together distally.  Essentially anatomic reduction was obtained on mortise  and lateral view.   PROCEDURE:  With adequate anesthesia obtained, using endotracheal technique,  1 g Ancef given IV prophylaxis and another one at tourniquet let down.  The  right foot was prepped from the toes to the knee in a standard fashion.  After standard prepping and draping, Esmarch exsanguination was used and the  tourniquet was let up to 350 mmHg.  We lysed the fracture blister and then  made a crescentic incision distal with the flap totally proximal, medially,  transverse to the medial malleolus.  An incision was deepened sharply with a  knife.  Hemostasis obtained using the Bovie electrocoagulator.  We then  dissected down to the medial malleolar fracture, the small piece noted, but  it was also comminuted.  I removed clot and then irrigated the medial joint  line, took out infolding periosteum and then reduced the medial malleolus  and placed two guide  pins for the 4.5 cannulated cancellous screws and then  placed the cancellous screws in.  The posterior one then cracked through the  posterior portion.  It was comminuted when I tightened it down so I placed  another 3.5, shorter.  Eventually we had to shorten the more posterior of  the screws because it was a bit too long on the lateral view but this  adjusted it appropriately.  We then irrigated this wound and closed it with  2-0 Vicryl and interrupted 4-0 nylon.   Attention was then turned to the lateral side and a lateral incision was  made.  Dissection was carried down to the anterolateral tibia as well as the  fibula fracture.  Clot evacuated.  We then reduced and smoothed down the  triplane plafond-type fracture with two 4.5 cannulated cancellous screws.  I  then applied traction to the fibula and opened up the area of comminution  and lengthened it and then put on the contoured plate and sequentially  screwed on either side of it until we had four bicortical screws proximal,  two bicortical screws distal and two unicortical screws distal for buttress  all the way to the tip of the fibula and then we put a cerclage band around  the comminuted area with bone graft and tightened it down and crimped it.  I  tried to get a second one but I could not capture the additional fragment  medially.  C-arm fluoroscopy confirmed positioning on mortise and lateral  view.  The lateral wound was irrigated and closed meticulously in layers of  2-0 and 3-0 Vicryl and vertical mattress sutures of 4-0 nylon with simple  interrupteds.  The bulky sterile compressive dressing was applied with  multiple padding and U stirrups with Webril between each layer so they would  not stick, as well as a posterior splint in neutral position, all cradling  the foot and ankle in neutral position with Webril between the plaster layer so that it could expand, and an Ace wrap.  The tourniquet had been let down  prior to  final closure on the lateral side.  The patient then was awakened,  having tolerated the procedure well, and taken to the recovery room in  satisfactory condition.  She was given 10 mg of Decadron IV to reduce  swelling in the postoperative phase and  will be placed in a sling in  traction which will hold her toes higher than the knee and the knee higher  than the heart.  She is admitted then for routine postoperative care.                                               Deidre Ala, M.D.    VEP/MEDQ  D:  05/05/2002  T:  05/06/2002  Job:  [id number]   cc:   Deidre Ala, M.D.  [address]  [place], [place] [address]  Fax: [phone]

## 2010-12-28 LAB — URINE CULTURE

## 2011-02-03 ENCOUNTER — Other Ambulatory Visit: Payer: Self-pay | Admitting: *Deleted

## 2011-02-03 MED ORDER — FERROUS SULFATE 325 (65 FE) MG PO TBEC: 325.0000 mg | DELAYED_RELEASE_TABLET | Freq: Two times a day (BID) | ORAL | Status: DC

## 2011-02-03 NOTE — Telephone Encounter (Signed)
Error in documentation

## 2011-02-25 ENCOUNTER — Encounter: Payer: Self-pay | Admitting: Family Medicine

## 2011-02-25 ENCOUNTER — Ambulatory Visit (INDEPENDENT_AMBULATORY_CARE_PROVIDER_SITE_OTHER): Payer: Medicare Other | Admitting: Family Medicine

## 2011-02-25 VITALS — BP 124/72 | HR 84 | Temp 98.1°F | Wt 216.1 lb

## 2011-02-25 DIAGNOSIS — M545 Low back pain, unspecified: Secondary | ICD-10-CM

## 2011-02-25 MED ORDER — NAPROXEN 500 MG PO TABS: ORAL_TABLET | ORAL | Status: DC

## 2011-02-25 MED ORDER — CYCLOBENZAPRINE HCL 5 MG PO TABS: 5.0000 mg | ORAL_TABLET | Freq: Three times a day (TID) | ORAL | Status: DC | PRN

## 2011-02-25 NOTE — Patient Instructions (Signed)
Sounds like lumbar strain, stretching exercises provided. Treat with naprosyn with food twice daily for 1 wk then as needed. May use flexeril as well twice daily as needed, may make you sleepy.

## 2011-02-25 NOTE — Assessment & Plan Note (Signed)
Anticipate lumbar strain, likely pulled muscle when mowing lawn. Recommend NSAID, muscle relaxant, stretching exercises from SM pt advisor on lower back pain provided. If not improved, consider steroid course as pt states has tolerated well in past, held off this visit 2/2 h/o mood issues and mult psychotropics she's currently on. Discussed precautions with flexeril and oxycontin.

## 2011-02-25 NOTE — Progress Notes (Signed)
  Subjective:    Patient ID: Jamie Patterson, female    DOB: [birth year], 50 y.o.   MRN: [id number]  HPI CC:  Back pain, ST  3d h/o back pain, started when got off lawnmower.  Mid lower back pain travelling down left leg, then this am pain started on both sides.  Pain characterized as sharp pain.  Has tried heat to back.  Has tried oxycontin, didn't help.  Takes oxycontin for right ankle pain s/p trimalleolar fracture.  Usually doesn't need to take.  No h/o back issues in past.  No fevers/chills, saddle anestheisa, bowel/bladder accidents, denies inciting trauma to back.  No dysuria, urgency, abd pain, n/v.  Has also had ST.  No fevers/chills, coughing, RN, congestion, itchy eyes.  Wonders if could have allergies.  Review of Systems Per HPI    Objective:   Physical Exam  Vitals reviewed. Constitutional: She appears well-developed and well-nourished. No distress.  HENT:  Head: Normocephalic and atraumatic.  Musculoskeletal:       Lumbar back: She exhibits tenderness (midline upper lumbar).       + mild paraspinous tightness, + muscle spasm L hip on exam Neg SLR test. No pain with int/ext rotation at hips. + SIJ pain bilaterally, unable to check FABER 2/2 stiffness. No GTB pain.  Neurological: No sensory deficit.  Reflex Scores:      Patellar reflexes are 2+ on the right side and 2+ on the left side.      Achilles reflexes are 2+ on the right side and 2+ on the left side.      Assessment & Plan:

## 2011-03-04 ENCOUNTER — Other Ambulatory Visit: Payer: Self-pay | Admitting: *Deleted

## 2011-03-04 MED ORDER — FERROUS SULFATE 325 (65 FE) MG PO TBEC: 325.0000 mg | DELAYED_RELEASE_TABLET | Freq: Two times a day (BID) | ORAL | Status: DC

## 2011-04-16 ENCOUNTER — Other Ambulatory Visit: Payer: Self-pay | Admitting: *Deleted

## 2011-04-16 MED ORDER — SIMVASTATIN 40 MG PO TABS: 40.0000 mg | ORAL_TABLET | Freq: Every day | ORAL | Status: DC

## 2011-07-17 ENCOUNTER — Encounter: Payer: Self-pay | Admitting: Family Medicine

## 2011-07-17 ENCOUNTER — Ambulatory Visit (INDEPENDENT_AMBULATORY_CARE_PROVIDER_SITE_OTHER): Payer: Medicare Other | Admitting: Family Medicine

## 2011-07-17 DIAGNOSIS — F329 Major depressive disorder, single episode, unspecified: Secondary | ICD-10-CM

## 2011-07-17 DIAGNOSIS — M12579 Traumatic arthropathy, unspecified ankle and foot: Secondary | ICD-10-CM

## 2011-07-17 NOTE — Assessment & Plan Note (Addendum)
Self stopped narcotic (oxycontin 10mg  tid) and benzo (valium 10mg  qid). Main concern was stopping benzo cold [place] but this has been at least 2 1/2 wks ago, seems stable from medical perspective. No psychosis, no hallucinations, no seizures.  + anxiety, tremors but very intermittent per pt. I do not think she needs benzo taper currently. At all costs pt does not want to restart benzos or narcotics. Advised re establish with counselor, will see if she can get in with psych sooner than scheduled 08/2011. Followed by Dr. [name], psych.  Will fax note to her. I have placed a call into Dr. [name] to touch base with her re pt and plan A total of 25 minutes were spent face-to-face with the patient during this encounter and over half of that time was spent on counseling and coordination of care

## 2011-07-17 NOTE — Patient Instructions (Addendum)
Return at your convenience for physical Flu shot at pharmacy. I'm glad you're doing well today. The increased anxiety can be expected after coming off so many medicines. Let's give this more time. I do think we have given valium enough time to get out of your system. If you start having worsening temperature changes, fever, worsening tremor, please seek care. Otherwise re establish with counselor as well as I would call to see if we can get you in sooner with Dr. [name].  (I will try and touch base with her as well).

## 2011-07-17 NOTE — Progress Notes (Signed)
Subjective:    Patient ID: Jamie Patterson, female    DOB: 04-Mar-1961, 50 y.o.   MRN: [id number]  HPI CC: anxiety, meds  Pleasant 50 yo with h/o depression >anxiety, chronic right ankle pain s/p trimalleolar fx and fusion.  Presents with sister in law, [place].  3 wks ago thinks may have accidentally doubled up on oxycontin and flexeril, this led her to be out of comission for 6 days in bed.  Since Friday after thanksgiving, has had no narcotics or benzos.  Does not want to restart.  Worried about having been addicted.  Has significant family support - she has signed release form to have sister in law or son at next several office visits with her.  Self stopped previous valium 10mg  qid, oxycodone 10mg  tid and flexeril.  Taking other medicines except abilify which was too expensive.  Currently taking 2 alleve in am, 2 acetaminophen ES 500mg  at noon and 2 alleve in pm.  This is adequately controlling ankle pain.  No fevers/chills, no heat or cold intolerance, abd pain, n/v, bowel changes.    Feeling anxiety attack since 7am this morning, concerned about doctor appt, also went to [employer] and grocery store this morning.  Not used to going out of house.  Bonita Quin mentions Decarla has been like hermit at home for last year, "not participating in life".    Hasn't fallen since day after thanksgiving.  Family has built ramp for her which has helped.  Thinks imbalance may have slightly improved since off benzo/narcotic.  Brings HCPOA form designating Aileen Pilot (son) as HCPOA.  Have asked to scan into system.  Sees Dr. Milagros Evener (psych) for depression > anxiety pain - Dr. Murray Hodgkins. oxycontin 10mg  3 a day. Therapist in [facility], good relationship with him  Wt Readings from Last 3 Encounters:  07/17/11 185 lb 8 oz (84.142 kg)  02/25/11 216 lb 1.9 oz (98.031 kg)  12/25/10 209 lb 1.9 oz (94.856 kg)   Medications and allergies reviewed and updated in chart.  Past histories reviewed  and updated if relevant as below. Patient Active Problem List  Diagnoses  . HYPERCHOLESTEROLEMIA  . DEPRESSION  . CYSTITIS, ACUTE  . ARTHRITIS, TRAUMATIC, ANKLE  . DYSEQUILIBRIUM  . ANEMIA, IRON DEFICIENCY  . Lower back pain   Past Medical History  Diagnosis Date  . HLD (hyperlipidemia)   . Depression     followed by psych  . Chronic pain due to injury     at R ankle due to ankle fracture  . Imbalance     likely multifactorial (s/p balance training at [facility] 08/2010)   Past Surgical History  Procedure Date  . Ankle fusion 02/2004    Right-50% disability   . Orif ankle fracture 06/2003    Right   History  Substance Use Topics  . Smoking status: Former Smoker    Quit date: 08/12/1995  . Smokeless tobacco: Not on file  . Alcohol Use: Yes     Rare-social   Family History  Problem Relation Age of Onset  . Atrial fibrillation Father   . Hypertension Father   . Osteoarthritis Father   . Aortic aneurysm Father   . Depression Mother     V-tach; arrhythmia  . Hepatitis Brother     Hep. C  . Anemia Sister    No Known Allergies Current Outpatient Prescriptions on File Prior to Visit  Medication Sig Dispense Refill  . buPROPion (WELLBUTRIN SR) 150 MG 12 hr tablet  Take 450 mg by mouth daily.        . ferrous sulfate 325 (65 FE) MG EC tablet Take 1 tablet (325 mg total) by mouth 2 (two) times daily.  60 tablet  6  . FLUoxetine (PROZAC) 20 MG capsule Take 60 mg by mouth daily.        Marland Kitchen lamoTRIgine (LAMICTAL) 150 MG tablet Take 150 mg by mouth at bedtime.        . simvastatin (ZOCOR) 40 MG tablet Take 1 tablet (40 mg total) by mouth at bedtime.  30 tablet  3  . vitamin B-12 (CYANOCOBALAMIN) 1000 MCG tablet Take 1,000 mcg by mouth daily.        . ARIPiprazole (ABILIFY) 30 MG tablet Take 30 mg by mouth at bedtime.        Marland Kitchen aspirin 81 MG tablet Take 81 mg by mouth daily.        . calcium carbonate (OS-CAL) 600 MG TABS Take 600 mg by mouth 2 (two) times daily with a meal.        .  cyclobenzaprine (FLEXERIL) 5 MG tablet Take 1 tablet (5 mg total) by mouth every 8 (eight) hours as needed for muscle spasms.  30 tablet  1   Review of Systems Per HPI      Objective:   Physical Exam  Nursing note and vitals reviewed. Constitutional: She appears well-developed and well-nourished. No distress.  HENT:  Head: Normocephalic and atraumatic.  Right Ear: Hearing, tympanic membrane, external ear and ear canal normal.  Left Ear: Hearing, tympanic membrane, external ear and ear canal normal.  Mouth/Throat: Oropharynx is clear and moist. No oropharyngeal exudate.  Eyes: Conjunctivae and EOM are normal. Pupils are equal, round, and reactive to light. No scleral icterus.  Neck: Normal range of motion. Neck supple.  Cardiovascular: Normal rate, regular rhythm, normal heart sounds and intact distal pulses.   No murmur heard. Pulmonary/Chest: Effort normal and breath sounds normal. No respiratory distress. She has no wheezes. She has no rales.  Musculoskeletal: She exhibits no edema.  Lymphadenopathy:    She has no cervical adenopathy.  Neurological: Coordination abnormal.       Slight imbalance with stance/gait, longstanding Resting tremor present  Skin: Skin is warm and dry. No rash noted.  Psychiatric: She has a normal mood and affect.       Assessment & Plan:

## 2011-08-12 HISTORY — PX: OTHER SURGICAL HISTORY: SHX169

## 2011-08-19 ENCOUNTER — Emergency Department (HOSPITAL_COMMUNITY): Payer: Medicare Other

## 2011-08-19 ENCOUNTER — Encounter (HOSPITAL_COMMUNITY): Payer: Self-pay

## 2011-08-19 ENCOUNTER — Inpatient Hospital Stay (HOSPITAL_COMMUNITY): Payer: Medicare Other

## 2011-08-19 ENCOUNTER — Inpatient Hospital Stay (HOSPITAL_COMMUNITY)
Admission: EM | Admit: 2011-08-19 | Discharge: 2011-08-21 | DRG: 101 | Disposition: A | Discharge status: Home / Self Care | Payer: Medicare Other | Attending: Internal Medicine | Admitting: Internal Medicine

## 2011-08-19 DIAGNOSIS — R55 Syncope and collapse: Secondary | ICD-10-CM

## 2011-08-19 DIAGNOSIS — D509 Iron deficiency anemia, unspecified: Secondary | ICD-10-CM

## 2011-08-19 DIAGNOSIS — R42 Dizziness and giddiness: Secondary | ICD-10-CM | POA: Diagnosis present

## 2011-08-19 DIAGNOSIS — E785 Hyperlipidemia, unspecified: Secondary | ICD-10-CM | POA: Diagnosis present

## 2011-08-19 DIAGNOSIS — I10 Essential (primary) hypertension: Secondary | ICD-10-CM | POA: Diagnosis present

## 2011-08-19 DIAGNOSIS — F329 Major depressive disorder, single episode, unspecified: Secondary | ICD-10-CM

## 2011-08-19 DIAGNOSIS — G8929 Other chronic pain: Secondary | ICD-10-CM | POA: Diagnosis present

## 2011-08-19 DIAGNOSIS — Z87891 Personal history of nicotine dependence: Secondary | ICD-10-CM

## 2011-08-19 DIAGNOSIS — R002 Palpitations: Secondary | ICD-10-CM | POA: Diagnosis present

## 2011-08-19 DIAGNOSIS — E7800 Pure hypercholesterolemia, unspecified: Secondary | ICD-10-CM

## 2011-08-19 DIAGNOSIS — Z79899 Other long term (current) drug therapy: Secondary | ICD-10-CM

## 2011-08-19 DIAGNOSIS — M12579 Traumatic arthropathy, unspecified ankle and foot: Secondary | ICD-10-CM

## 2011-08-19 DIAGNOSIS — Z681 Body mass index (BMI) 19 or less, adult: Secondary | ICD-10-CM

## 2011-08-19 DIAGNOSIS — R000 Tachycardia, unspecified: Secondary | ICD-10-CM

## 2011-08-19 DIAGNOSIS — G40909 Epilepsy, unspecified, not intractable, without status epilepticus: Principal | ICD-10-CM | POA: Diagnosis present

## 2011-08-19 DIAGNOSIS — E860 Dehydration: Secondary | ICD-10-CM

## 2011-08-19 DIAGNOSIS — Z7982 Long term (current) use of aspirin: Secondary | ICD-10-CM

## 2011-08-19 DIAGNOSIS — F3289 Other specified depressive episodes: Secondary | ICD-10-CM | POA: Diagnosis present

## 2011-08-19 HISTORY — DX: Essential (primary) hypertension: I10

## 2011-08-19 HISTORY — DX: Syncope and collapse: R55

## 2011-08-19 HISTORY — DX: Iron deficiency anemia, unspecified: D50.9

## 2011-08-19 LAB — URINALYSIS, ROUTINE W REFLEX MICROSCOPIC
Hgb urine dipstick: NEGATIVE
Ketones, ur: NEGATIVE mg/dL
Protein, ur: NEGATIVE mg/dL
Urobilinogen, UA: 0.2 mg/dL (ref 0.0–1.0)

## 2011-08-19 LAB — DIFFERENTIAL
Basophils Absolute: 0 10*3/uL (ref 0.0–0.1)
Basophils Relative: 0 % (ref 0–1)
Eosinophils Absolute: 0.1 10*3/uL (ref 0.0–0.7)
Eosinophils Relative: 1 % (ref 0–5)
Lymphocytes Relative: 11 % — ABNORMAL LOW (ref 12–46)
Monocytes Absolute: 0.8 10*3/uL (ref 0.1–1.0)

## 2011-08-19 LAB — CBC
HCT: 43.4 % (ref 36.0–46.0)
MCHC: 35 g/dL (ref 30.0–36.0)
MCV: 97.1 fL (ref 78.0–100.0)
Platelets: 248 10*3/uL (ref 150–400)
RDW: 12.3 % (ref 11.5–15.5)
WBC: 9.9 10*3/uL (ref 4.0–10.5)

## 2011-08-19 LAB — POCT PREGNANCY, URINE: Preg Test, Ur: NEGATIVE

## 2011-08-19 LAB — BASIC METABOLIC PANEL
Calcium: 9.7 mg/dL (ref 8.4–10.5)
Creatinine, Ser: 0.53 mg/dL (ref 0.50–1.10)
GFR calc Af Amer: >90 mL/min (ref >90)
GFR calc non Af Amer: >90 mL/min (ref >90)
Sodium: 138 mEq/L (ref 135–145)

## 2011-08-19 MED ORDER — SODIUM CHLORIDE 0.9 % IV BOLUS (SEPSIS)
1000.0000 mL | Freq: Once | INTRAVENOUS | Status: AC
  Administered 2011-08-19: 1000 mL via INTRAVENOUS

## 2011-08-19 MED ORDER — METOCLOPRAMIDE HCL 5 MG/ML IJ SOLN
10.0000 mg | Freq: Once | INTRAMUSCULAR | Status: AC
  Administered 2011-08-19: 10 mg via INTRAVENOUS
  Filled 2011-08-19: qty 2

## 2011-08-19 MED ORDER — ASPIRIN EC 81 MG PO TBEC
81.0000 mg | DELAYED_RELEASE_TABLET | Freq: Every evening | ORAL | Status: DC
  Administered 2011-08-19: 81 mg via ORAL
  Filled 2011-08-19 (×3): qty 1

## 2011-08-19 MED ORDER — SODIUM CHLORIDE 0.9 % IV SOLN
INTRAVENOUS | Status: DC
  Administered 2011-08-19 – 2011-08-21 (×4): via INTRAVENOUS

## 2011-08-19 MED ORDER — SENNA 8.6 MG PO TABS
1.0000 | ORAL_TABLET | Freq: Two times a day (BID) | ORAL | Status: DC
  Administered 2011-08-19 – 2011-08-20 (×2): 8.6 mg via ORAL
  Filled 2011-08-19 (×5): qty 1

## 2011-08-19 MED ORDER — KETOROLAC TROMETHAMINE 30 MG/ML IJ SOLN
30.0000 mg | Freq: Once | INTRAMUSCULAR | Status: AC
  Administered 2011-08-19: 30 mg via INTRAVENOUS
  Filled 2011-08-19: qty 1

## 2011-08-19 MED ORDER — SODIUM CHLORIDE 0.9 % IV SOLN: INTRAVENOUS | Status: DC

## 2011-08-19 MED ORDER — BUPROPION HCL ER (XL) 300 MG PO TB24
450.0000 mg | ORAL_TABLET | ORAL | Status: DC
  Administered 2011-08-20 – 2011-08-21 (×2): 450 mg via ORAL
  Filled 2011-08-19 (×3): qty 1

## 2011-08-19 MED ORDER — ONDANSETRON HCL 4 MG PO TABS: 4.0000 mg | ORAL_TABLET | Freq: Four times a day (QID) | ORAL | Status: DC | PRN

## 2011-08-19 MED ORDER — ONDANSETRON HCL 4 MG/2ML IJ SOLN: 4.0000 mg | Freq: Four times a day (QID) | INTRAMUSCULAR | Status: DC | PRN

## 2011-08-19 MED ORDER — ACETAMINOPHEN 650 MG RE SUPP: 650.0000 mg | Freq: Four times a day (QID) | RECTAL | Status: DC | PRN

## 2011-08-19 MED ORDER — LAMOTRIGINE 150 MG PO TABS
150.0000 mg | ORAL_TABLET | ORAL | Status: DC
  Administered 2011-08-20 – 2011-08-21 (×2): 150 mg via ORAL
  Filled 2011-08-19 (×3): qty 1

## 2011-08-19 MED ORDER — ACETAMINOPHEN 325 MG PO TABS
650.0000 mg | ORAL_TABLET | Freq: Four times a day (QID) | ORAL | Status: DC | PRN
  Administered 2011-08-20 – 2011-08-21 (×4): 650 mg via ORAL
  Filled 2011-08-19 (×4): qty 2

## 2011-08-19 MED ORDER — FLUOXETINE HCL 20 MG PO CAPS
80.0000 mg | ORAL_CAPSULE | ORAL | Status: DC
  Administered 2011-08-20 – 2011-08-21 (×2): 80 mg via ORAL
  Filled 2011-08-19 (×3): qty 4

## 2011-08-19 MED ORDER — GADOBENATE DIMEGLUMINE 529 MG/ML IV SOLN
18.0000 mL | Freq: Once | INTRAVENOUS | Status: AC | PRN
  Administered 2011-08-19: 18 mL via INTRAVENOUS

## 2011-08-19 MED ORDER — DOCUSATE SODIUM 100 MG PO CAPS
100.0000 mg | ORAL_CAPSULE | Freq: Two times a day (BID) | ORAL | Status: DC
  Administered 2011-08-19 – 2011-08-20 (×2): 100 mg via ORAL
  Filled 2011-08-19 (×5): qty 1

## 2011-08-19 MED ORDER — SIMVASTATIN 40 MG PO TABS
40.0000 mg | ORAL_TABLET | Freq: Every day | ORAL | Status: DC
  Administered 2011-08-19 – 2011-08-20 (×2): 40 mg via ORAL
  Filled 2011-08-19 (×3): qty 1

## 2011-08-19 NOTE — ED Notes (Signed)
Pt presents with witnessed fall by son while pt was taking ornaments off of Christmas tree.  Pt has no recollection of event.  PIV to L hand is 20g angiocath which is saline locked.  CBG: 103.  Pt is fully immobilized.

## 2011-08-19 NOTE — ED Provider Notes (Signed)
  Physical Exam  BP 144/90  Pulse 149  Temp(Src) 98.9 F (37.2 C) (Oral)  Resp 18  Ht 5\' 7"  (1.702 m)  Wt 180 lb (81.647 kg)  BMI 28.19 kg/m2  SpO2 97%  LMP 08/14/2011  Physical Exam  ED Course  Procedures  MDM Pt received from [place], PA-C.  He would like pt to be hydrated, reassessed and d/c'd home if unremarkable CT head and nml VS.  Pt has received approx 750cc NS bolus.  She continues to be tachycardic at 120bpm.  She c/o right frontal headache and facial pain.   CT head shows advanced white matter changes; no hemorrhage.  30mg  IV toradol and second liter bolus ordered.  Will reassess shortly.  6:00 PM   Pt has had minimal improvement in headache and c/o feeling flushed and shaky.  Mucous membranes dry and HR continues to be 118-120bmp despite 2L bolus.  [facility] consulted for admission for syncope and dehydration.   Syncope may be secondary to cardiac arrhythmia or primary seizure (based on head CT findings).  Dr. Terrace Arabia has been consulted as well and will see pt in hospital.  IV reglan ordered for headache.       Otilio Miu, PA 08/20/11 1440

## 2011-08-19 NOTE — ED Notes (Signed)
[id number] Ready 

## 2011-08-19 NOTE — ED Notes (Signed)
Son at bedside, witnessed event.  He reports that pt was taking ornaments off Christmas tree, screamed out and fell face forward into tree.  He reports pt possible hit her head on window sill, pt fell onto carpet, and began shaking "like a seizure" x 30 seconds.  He reports pt was disoriented right after event.  Pt is alert, has amnesia to event.  Multiple abrasions noted to face and hematoma noted to R forehead.  Pt is not incontinent of urine or feces.  Pt remains on backboard with c-collar intact.

## 2011-08-19 NOTE — ED Notes (Signed)
Pt reports h/o depression, was on valium and oxycontin but stopped taking "cold [place]" around Thanksgiving.  Pt reports taking naproxen BID for pain.

## 2011-08-19 NOTE — H&P (Signed)
PCP:  [name], MD, MD  Confirmed with pt. Jamie Patterson Dr. Milagros Evener (psych) for depression, anxiety Pain - Dr. Murray Hodgkins Therapist in [facility]  Chief Complaint:  Syncope, sudden collapse   HPI: 50yoF with h/o IDA, depression, chronic pain due to R ankle fracture, dysequilibrium presents  with sudden onset syncope and collapse.   Pt can relate history, however son who is not currently available witnessed the event. She was in  her usual state of fairly good health, until noon today when she was taking down the Christmas  tree when she had witnessed collapse and fall into the tree. She denies any prodrome at all -- no  vagal symptoms, no SOB, CP, h/a's, dizziness, lightheadedness, tingling, numbness, focal neuro  deficits, slurred speech, facial droop. There were no sudden positional changes, and she had been  standing the whole time; she denies decreased PO intake or feeling dehydrated beforehand. Per  report, she went down and had jerking motions although this is not substantiated, however the son  who is a firefighter/EMS trained has said he thought it was a seizure. She remember waking up in  her home and talking to an EMS person, knew her son was there, but doesn't know how long she was  out. She denies any h/o seizure activity or anything like this before.   In the ED, pt was tachycardic from 112-140 with stable BP's. Labs significant for normal chem  panel including renal 6/0.53, normal CBC, negative pregnancy test, negative UA. CT head showed  advanced white matter changes -- non specific but can be seen with chronic microvascular  ischemia, demyelinating disease such as MS, vasculitis, complicated migraines or sequelae of  prior infxn or inflammation; mild generalized atrophy advanced for her age, otherwise no acute  abnml. Dr. Terrace Arabia in Neuro was consulted in the ED.   Since arrival to the ED, pt now having bilateral hand and feet tingling and  numbness, and shaking  that was not present before the ED. She is also feeling flushed and very thirsty, dehydrated, having a headache, and nauseous, all not present before ED. Denies abdominal pain, SOB, CP, dysuria. Was in her usual state of health before this. Does give history that in the week before thanksgiving, she possibly took too much of Oxycontin and valium and ended up being in bed for 6 days, after which she stopped taking both cold [place] which she had been taking 9 and 4 years respectively. ROS o/w negative.   Past Medical History  Diagnosis Date  . HLD (hyperlipidemia)   . Depression     followed by psych  . Chronic pain due to injury     at R ankle due to ankle fracture  . Imbalance     likely multifactorial (s/p balance training at [facility] 08/2010)  . Right ankle pain 2003    trimalleolar fx s/p fusion  . Hypertension   . Iron deficiency anemia     Past Surgical History  Procedure Date  . Ankle fusion 02/2004    Right-50% disability   . Orif ankle fracture 06/2003    Right  . Cesarean section    Medications: HOME MEDS: Reconciled with the patient  Prior to Admission medications   Medication Sig Start Date End Date Taking? Authorizing Provider  acetaminophen (TYLENOL) 500 MG tablet Take 500 mg by mouth as needed.    Yes Historical Provider, MD  aspirin EC 81 MG tablet Take 81 mg by mouth every evening.  Yes Historical Provider, MD  buPROPion (WELLBUTRIN XL) 150 MG 24 hr tablet Take 450 mg by mouth every morning.     Yes Historical Provider, MD  calcium-vitamin D (OSCAL WITH D) 500-200 MG-UNIT per tablet Take 1 tablet by mouth every evening.     Yes Historical Provider, MD  ferrous sulfate 325 (65 FE) MG EC tablet Take 1 tablet (325 mg total) by mouth 2 (two) times daily. 03/04/11 03/03/12 Yes [name], MD  FLUoxetine (PROZAC) 40 MG capsule Take 80 mg by mouth every morning.     Yes Historical Provider, MD  lamoTRIgine (LAMICTAL) 150 MG tablet Take 150 mg by  mouth every morning.    Yes Historical Provider, MD  naproxen sodium (ANAPROX) 220 MG tablet Take 440 mg by mouth 2 (two) times daily with a meal.     Yes Historical Provider, MD  OVER THE COUNTER MEDICATION Take 1 capsule by mouth daily. instaflex joint support    Yes Historical Provider, MD  simvastatin (ZOCOR) 40 MG tablet Take 1 tablet (40 mg total) by mouth at bedtime. 04/16/11  Yes [name], MD  vitamin B-12 (CYANOCOBALAMIN) 1000 MCG tablet Take 1,000 mcg by mouth daily.     Yes Historical Provider, MD   Allergies:  No Known Allergies  Social History:   reports that she quit smoking about 16 years ago. She has never used smokeless tobacco. She reports that she drinks alcohol. She reports that she does not use illicit drugs. No caffeine. Divorced. 1 child. 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, although endorses that she falls a lot   Family History: Family History  Problem Relation Age of Onset  . Atrial fibrillation Father   . Hypertension Father   . Osteoarthritis Father   . Aortic aneurysm Father   . Depression Mother     V-tach; arrhythmia  . Hepatitis Brother     Hep. C  . Anemia Sister   . Arrhythmia Mother     Dx with Kirby Funk after having sudden collapse, did NOT have h/o AMI's    Physical Exam: Filed Vitals:   08/19/11 1643 08/19/11 1833 08/19/11 1923 08/19/11 2021  BP: 144/90 126/83 130/87 128/59  Pulse:  112    Temp:    98 F (36.7 C)  TempSrc:      Resp:  16    Height:      Weight:      SpO2: 97% 97% 99% 99%   Blood pressure 128/59, pulse 112, temperature 98 F (36.7 C), temperature source Oral, resp. rate 16, height 5\' 7"  (1.702 m), weight 81.647 kg (180 lb), last menstrual period 08/14/2011, SpO2 99.00%. Gen: Moderately overweight F in ED stretcher with wet towel over head, able to relate her history  well, no distress. Is slightly tremulous appearing in her hands and feet.   HEENT:  PERRL ~3-59mm, constrict well with light, EOMI, sclera clear, no nystagmus. Tongue is quite  dry appearing but no oral lesions noted. Face diffusely flush and red, with abrasions on her  cheek. R upper forehead with hematoma and ecchymoses Lungs: CTAB no w/c/r, normal exam overall Heart: Slightly tachycardic but normal S1/2 without apparent m/g appreciated Abd: Overweight, but soft, NT ND, benign overall Extrem: Average bulk, no increased tone, warm and perfusing. Bilateral feet are in a flexed  position with difficulty extending them. Toes are curled up as well.  Neuro: Alert, conversant, pleasant, oriented. CN2-12 intact, tongue  midline and moves well, no  facial droop or slurred speech. Moving extremities spontaneously with no weakness or deficit  noted for proximal and distal muscle groups. No increased tone. Fine fasciculations noted in arch  of left foot.   Labs & Imaging Results for orders placed during the hospital encounter of 08/19/11 (from the past 48 hour(s))  URINALYSIS, ROUTINE W REFLEX MICROSCOPIC     Status: Normal   Collection Time   08/19/11  3:00 PM      Component Value Range Comment   Color, Urine YELLOW  YELLOW     APPearance CLEAR  CLEAR     Specific Gravity, Urine 1.008  1.005 - 1.030     pH 8.0  5.0 - 8.0     Glucose, UA NEGATIVE  NEGATIVE (mg/dL)    Hgb urine dipstick NEGATIVE  NEGATIVE     Bilirubin Urine NEGATIVE  NEGATIVE     Ketones, ur NEGATIVE  NEGATIVE (mg/dL)    Protein, ur NEGATIVE  NEGATIVE (mg/dL)    Urobilinogen, UA 0.2  0.0 - 1.0 (mg/dL)    Nitrite NEGATIVE  NEGATIVE     Leukocytes, UA NEGATIVE  NEGATIVE  MICROSCOPIC NOT DONE ON URINES WITH NEGATIVE PROTEIN, BLOOD, LEUKOCYTES, NITRITE, OR GLUCOSE <1000 mg/dL.  POCT PREGNANCY, URINE     Status: Normal   Collection Time   08/19/11  3:09 PM      Component Value Range Comment   Preg Test, Ur NEGATIVE     CBC     Status: Abnormal   Collection Time   08/19/11  3:27 PM      Component Value Range Comment     WBC 9.9  4.0 - 10.5 (K/uL)    RBC 4.47  3.87 - 5.11 (MIL/uL)    Hemoglobin 15.2 (*) 12.0 - 15.0 (g/dL)    HCT 47.8  29.5 - 62.1 (%)    MCV 97.1  78.0 - 100.0 (fL)    MCH 34.0  26.0 - 34.0 (pg)    MCHC 35.0  30.0 - 36.0 (g/dL)    RDW 30.8  65.7 - 84.6 (%)    Platelets 248  150 - 400 (K/uL)   DIFFERENTIAL     Status: Abnormal   Collection Time   08/19/11  3:27 PM      Component Value Range Comment   Neutrophils Relative 80 (*) 43 - 77 (%)    Neutro Abs 7.9 (*) 1.7 - 7.7 (K/uL)    Lymphocytes Relative 11 (*) 12 - 46 (%)    Lymphs Abs 1.1  0.7 - 4.0 (K/uL)    Monocytes Relative 8  3 - 12 (%)    Monocytes Absolute 0.8  0.1 - 1.0 (K/uL)    Eosinophils Relative 1  0 - 5 (%)    Eosinophils Absolute 0.1  0.0 - 0.7 (K/uL)    Basophils Relative 0  0 - 1 (%)    Basophils Absolute 0.0  0.0 - 0.1 (K/uL)   BASIC METABOLIC PANEL     Status: Abnormal   Collection Time   08/19/11  3:27 PM      Component Value Range Comment   Sodium 138  135 - 145 (mEq/L)    Potassium 3.5  3.5 - 5.1 (mEq/L)    Chloride 104  96 - 112 (mEq/L)    CO2 24  19 - 32 (mEq/L)    Glucose, Bld 105 (*) 70 - 99 (mg/dL)    BUN 6  6 - 23 (mg/dL)  Creatinine, Ser 0.53  0.50 - 1.10 (mg/dL)    Calcium 9.7  8.4 - 10.5 (mg/dL)    GFR calc non Af Amer >90  >90 (mL/min)    GFR calc Af Amer >90  >90 (mL/min)    Ct Head Wo Contrast  08/19/2011  *RADIOLOGY REPORT*  Clinical Data: Seizure or syncopal episode.  Fall striking head on window cell.  Numerous abrasions to the face.  Headache near the vertex.  CT HEAD WITHOUT CONTRAST  Technique:  Contiguous axial images were obtained from the base of the skull through the vertex without contrast.  Comparison: None.  Findings: Mild generalized atrophy is advanced for age.  There is moderate periventricular subcortical white matter hypoattenuation. There is asymmetric enlargement of the frontal horn the left lateral ventricle with associated white matter change.  No acute cortical infarct,  hemorrhage, or mass lesion is present.  The paranasal sinuses and mastoid air cells are clear.  The osseous skull is intact.  No significant extra cranial soft tissue injury is evident.  IMPRESSION:  1.  Advanced white matter changes. The finding is nonspecific but can be seen in the setting of chronic microvascular ischemia, a demyelinating process such as multiple sclerosis, vasculitis, complicated migraine headaches, or as the sequelae of a prior infectious or inflammatory process. 2.  Mild generalized atrophy is advanced for age. 3.  No acute intracranial abnormality.  Original Report Authenticated By: Jamesetta Orleans. MATTERN, M.D.   ECG: NSR 120 bpm, normal axis, P and PR normal, normal QRS's without Q waves, good RWP, no ST  deviations, flat T's in III/aVF. Overall fairly normal. No prior for comparison  Telemetry: Sinus tachycardia to 120 on arrival that has trended down to 100's -- all sinus.  Impression Present on Admission:  .Syncope and collapse .Tachycardia  50yoF with h/o IDA, depression, chronic pain due to R ankle fracture, dysequilibrium presents  with sudden onset syncope and collapse.   1. Syncopal episode: DDx includes seizure activity vs cardiac dysrhythmia. This seems less likely  to be benign vasovagal or hypovolemic given its sudden onset without any hint of prodrome.   Regarding seizures, she has an abnormal head CT in otherwise healthy F, with differential MS vs  vasculitis (she says she had "rheumatoid nodules" removed from her arm previously but no h/o  classic RA) vs migraines vs prior infxn/inflammation. Interestingly she states she stopped Valium  which she took for 4 yrs about a month ago, so perhaps that had been suppressing a tendency  towards seizures.   Regarding arrhythmias, she states her mom had sudden collapse in her 13's, and was diagnosed with  VTach with no h/o AMI's, thus possibly suggesting a polymorphic VTach vs long QTc syndrome, or  other  familial dysrhythmias in this arena, or even HOCM.   Finally, lamictal and wellbutrin are rarely reported to cause syncope (< 1%).   - Echocardiogram, keep on telemetry, screening Trop x1, would also get her set up with outpatient  cardiac monitoring - MR brain with/without contrast, appreciate Neuro consultation. Routine EEG. Neuro checks  - ANA, RF, ANCA  - Orthostatics and maintenance IVF's  - TSH   2. Tachycardia: Review of telemetry and ECG shows this is benign sinus tach. Likely anxiety  related due to the event. Monitor for now.   3. Psych: continue prozac, lamictal, wellbutrin 4. HTN/HL: continue home ASA 81, statin 5. Holding other non-essential meds   Telemetry, MC team 1 Full code, discussed with pt   Other  plans as per orders.  Osha Rane 08/19/2011, 9:41 PM

## 2011-08-19 NOTE — Consult Note (Signed)
Subjective: She is a 51 y.o. female I am consulted to evaluate  syncopy and abnormal MRI brain.  She has PMHx of depression, on polypharmacy, and disability for it, also with PMhx of HTn, HLD, today at noon, while undo Christmas tree with her son, with out warning signs, she fell face down into the tree, LOC for 10 minutes, with possible seizure like activity, lip injury, but no tongue biting, no incontinence, no similar history in the past.   Now complains of right ankle pain from previous surgery, no lateralized motor sensory deficit, facial bruise.  MRI brain showed multiple subcortical, periventricular white matter disease, some pericallosal involvement. She has hx of migraine, now improved.  Patient Active Problem List  Diagnoses Date Noted  . Syncope and collapse 08/19/2011  . Tachycardia 08/19/2011  . ANEMIA, IRON DEFICIENCY 06/30/2010  . DYSEQUILIBRIUM 06/25/2010  . ARTHRITIS, TRAUMATIC, ANKLE 08/07/2009  . HYPERCHOLESTEROLEMIA 12/18/2006  . DEPRESSION 12/18/2006   Past Medical History  Diagnosis Date  . HLD (hyperlipidemia)   . Depression     followed by psych  . Chronic pain due to injury     at R ankle due to ankle fracture  . Imbalance     likely multifactorial (s/p balance training at [facility] 08/2010)  . Right ankle pain 2003    trimalleolar fx s/p fusion  . Hypertension   . Iron deficiency anemia     Past Surgical History  Procedure Date  . Ankle fusion 02/2004    Right-50% disability   . Orif ankle fracture 06/2003    Right  . Cesarean section     Prescriptions prior to admission  Medication Sig Dispense Refill  . acetaminophen (TYLENOL) 500 MG tablet Take 500 mg by mouth as needed.       Marland Kitchen aspirin EC 81 MG tablet Take 81 mg by mouth every evening.        Marland Kitchen buPROPion (WELLBUTRIN XL) 150 MG 24 hr tablet Take 450 mg by mouth every morning.        . calcium-vitamin D (OSCAL WITH D) 500-200 MG-UNIT per tablet Take 1 tablet by mouth every evening.        . ferrous  sulfate 325 (65 FE) MG EC tablet Take 1 tablet (325 mg total) by mouth 2 (two) times daily.  60 tablet  6  . FLUoxetine (PROZAC) 40 MG capsule Take 80 mg by mouth every morning.        . lamoTRIgine (LAMICTAL) 150 MG tablet Take 150 mg by mouth every morning.       . naproxen sodium (ANAPROX) 220 MG tablet Take 440 mg by mouth 2 (two) times daily with a meal.        . OVER THE COUNTER MEDICATION Take 1 capsule by mouth daily. instaflex joint support       . simvastatin (ZOCOR) 40 MG tablet Take 1 tablet (40 mg total) by mouth at bedtime.  30 tablet  3  . vitamin B-12 (CYANOCOBALAMIN) 1000 MCG tablet Take 1,000 mcg by mouth daily.         No Known Allergies  History  Substance Use Topics  . Smoking status: Former Smoker -- 1.0 packs/day for 20 years    Quit date: 08/12/1995  . Smokeless tobacco: Never Used  . Alcohol Use: Yes     Rare-social    Family History  Problem Relation Age of Onset  . Atrial fibrillation Father   . Hypertension Father   . Osteoarthritis Father   .  Aortic aneurysm Father   . Depression Mother     V-tach; arrhythmia  . Hepatitis Brother     Hep. C  . Anemia Sister   . Arrhythmia Mother     Dx with Kirby Funk after having sudden collapse, did NOT have h/o AMI's      Medications:  Prior to Admission:  Prescriptions prior to admission  Medication Sig Dispense Refill  . acetaminophen (TYLENOL) 500 MG tablet Take 500 mg by mouth as needed.       Marland Kitchen aspirin EC 81 MG tablet Take 81 mg by mouth every evening.        Marland Kitchen buPROPion (WELLBUTRIN XL) 150 MG 24 hr tablet Take 450 mg by mouth every morning.        . calcium-vitamin D (OSCAL WITH D) 500-200 MG-UNIT per tablet Take 1 tablet by mouth every evening.        . ferrous sulfate 325 (65 FE) MG EC tablet Take 1 tablet (325 mg total) by mouth 2 (two) times daily.  60 tablet  6  . FLUoxetine (PROZAC) 40 MG capsule Take 80 mg by mouth every morning.        . lamoTRIgine (LAMICTAL) 150 MG tablet Take 150 mg by mouth every  morning.       . naproxen sodium (ANAPROX) 220 MG tablet Take 440 mg by mouth 2 (two) times daily with a meal.        . OVER THE COUNTER MEDICATION Take 1 capsule by mouth daily. instaflex joint support       . simvastatin (ZOCOR) 40 MG tablet Take 1 tablet (40 mg total) by mouth at bedtime.  30 tablet  3  . vitamin B-12 (CYANOCOBALAMIN) 1000 MCG tablet Take 1,000 mcg by mouth daily.          Review of Systems Complains of depression, no chest pain, no sob, no abdominal pain, mild gait difficulty due to right ankle fusion surgery  Objective: Vital signs in last 24 hours: Temp:  [97.2 F (36.2 C)-98.9 F (37.2 C)] 97.2 F (36.2 C) (01/08 2200) Pulse Rate:  [107-149] 137  (01/08 2211) Resp:  [16-18] 18  (01/08 2200) BP: (126-144)/(59-104) 136/91 mmHg (01/08 2211) SpO2:  [97 %-99 %] 97 % (01/08 2200) Weight:  [53.706 kg (118 lb 6.4 oz)-81.647 kg (180 lb)] 118 lb 6.4 oz (53.706 kg) (01/08 2300)  General Exam:  Cardiac: regular rate rhythm Pulmonary: Clear to ausculation bilaterally Neck: supple, no carotoid bruise  Neurological Exam:  Mentation: alert oriented to time and place, casual conversation and history taking.  CN II-XII: Pupils were  equal reactive to light. Extraocular movement were full. Visual field were full on confrontational test. Facial sensation and strength were normal, with bilateral facial bruise.  Hearing were intact to finger rubbing bilaterally.  Uvula tongue midline. Shoulder shrugging and head turning were normal and symmetric  Motor: normal tone bulk and strength, mild limited ROM of right ankle, and tenderness  Sensory: intact to light touch, pin prick, vibratory sensation and proporioceptions.  Cordination: normal finger to nose, heel to shin, no dysmetria noticed.  Gait: deferred.    Data Review  Lab Result:  Results for orders placed during the hospital encounter of 08/19/11 (from the past 48 hour(s))  URINALYSIS, ROUTINE W REFLEX MICROSCOPIC      Status: Normal   Collection Time   08/19/11  3:00 PM      Component Value Range Comment   Color, Urine YELLOW  YELLOW  APPearance CLEAR  CLEAR     Specific Gravity, Urine 1.008  1.005 - 1.030     pH 8.0  5.0 - 8.0     Glucose, UA NEGATIVE  NEGATIVE (mg/dL)    Hgb urine dipstick NEGATIVE  NEGATIVE     Bilirubin Urine NEGATIVE  NEGATIVE     Ketones, ur NEGATIVE  NEGATIVE (mg/dL)    Protein, ur NEGATIVE  NEGATIVE (mg/dL)    Urobilinogen, UA 0.2  0.0 - 1.0 (mg/dL)    Nitrite NEGATIVE  NEGATIVE     Leukocytes, UA NEGATIVE  NEGATIVE  MICROSCOPIC NOT DONE ON URINES WITH NEGATIVE PROTEIN, BLOOD, LEUKOCYTES, NITRITE, OR GLUCOSE <1000 mg/dL.  POCT PREGNANCY, URINE     Status: Normal   Collection Time   08/19/11  3:09 PM      Component Value Range Comment   Preg Test, Ur NEGATIVE     CBC     Status: Abnormal   Collection Time   08/19/11  3:27 PM      Component Value Range Comment   WBC 9.9  4.0 - 10.5 (K/uL)    RBC 4.47  3.87 - 5.11 (MIL/uL)    Hemoglobin 15.2 (*) 12.0 - 15.0 (g/dL)    HCT 16.1  09.6 - 04.5 (%)    MCV 97.1  78.0 - 100.0 (fL)    MCH 34.0  26.0 - 34.0 (pg)    MCHC 35.0  30.0 - 36.0 (g/dL)    RDW 40.9  81.1 - 91.4 (%)    Platelets 248  150 - 400 (K/uL)   DIFFERENTIAL     Status: Abnormal   Collection Time   08/19/11  3:27 PM      Component Value Range Comment   Neutrophils Relative 80 (*) 43 - 77 (%)    Neutro Abs 7.9 (*) 1.7 - 7.7 (K/uL)    Lymphocytes Relative 11 (*) 12 - 46 (%)    Lymphs Abs 1.1  0.7 - 4.0 (K/uL)    Monocytes Relative 8  3 - 12 (%)    Monocytes Absolute 0.8  0.1 - 1.0 (K/uL)    Eosinophils Relative 1  0 - 5 (%)    Eosinophils Absolute 0.1  0.0 - 0.7 (K/uL)    Basophils Relative 0  0 - 1 (%)    Basophils Absolute 0.0  0.0 - 0.1 (K/uL)   BASIC METABOLIC PANEL     Status: Abnormal   Collection Time   08/19/11  3:27 PM      Component Value Range Comment   Sodium 138  135 - 145 (mEq/L)    Potassium 3.5  3.5 - 5.1 (mEq/L)    Chloride 104  96 - 112  (mEq/L)    CO2 24  19 - 32 (mEq/L)    Glucose, Bld 105 (*) 70 - 99 (mg/dL)    BUN 6  6 - 23 (mg/dL)    Creatinine, Ser 7.82  0.50 - 1.10 (mg/dL)    Calcium 9.7  8.4 - 10.5 (mg/dL)    GFR calc non Af Amer >90  >90 (mL/min)    GFR calc Af Amer >90  >90 (mL/min)                               Radiology Results: Ct Head Wo Contrast  08/19/2011  CT HEAD WITHOUT CONTRAST .  Advanced white matter changes. The finding is nonspecific but can be seen in  the setting of chronic microvascular ischemia, a demyelinating process such as multiple sclerosis, vasculitis, complicated migraine headaches, or as the sequelae of a prior infectious or inflammatory process. 2.  Mild generalized atrophy is advanced for age. 3.  No acute intracranial abnormality.      Assessment/Plan: Jamie Patterson 51 y.o. female presenting with passing out episode, and abnormal MRI brain.  1. Passing out: syncopy, there was possible seizure like activity, EEG, but no anti-epileptic medication now. 2. Abnormal MRI brain: ddx, migraine related small vessel disease, vasculitis, inflammation, even multiple sclerosis, may consider LP.

## 2011-08-19 NOTE — ED Notes (Signed)
Resting quietly in bed. Resp even and unlabored. Complaining of headache 5/10. Family at bedside. Awaitign admission.

## 2011-08-19 NOTE — ED Provider Notes (Signed)
History     CSN: [id number]  Arrival date & time 08/19/11  1332   First MD Initiated Contact with Patient 08/19/11 1342      Chief Complaint  Patient presents with  . Loss of Consciousness    (Consider location/radiation/quality/duration/timing/severity/associated sxs/prior treatment) HPI Comments: Patient presents with witnessed loss of consciousness while taking down ornaments from xmas tree this afternoon.  She reports no prodrome but her son reports her falling face forward into the tree and hitting her head on a window sill before landing on the ground.  Once down she immediately began to shake in upper and lower extremities for 30 seconds.  She has no recollection of the events and began to respond to paramedics approx 5 mins after the syncopal episode.  No prev. hx of seizures or syncope.  No urinary incontinence or lateral tongue biting.  Reports hypertension but that she no longer takes medication.  Denies neck pain, numbness/tingling and weakness in all extremities, palpitations, chest pain, and SOB.  She reports pain from her superficial scrapes on her face/lip.          Patient is a 51 y.o. female presenting with syncope. The history is provided by the patient and a relative.  Loss of Consciousness This is a new problem. The current episode started today. The problem has been resolved. Pertinent negatives include no abdominal pain, chest pain, chills, congestion, coughing, diaphoresis, fever, headaches, myalgias, nausea, neck pain, numbness, rash, sore throat, vertigo, vomiting or weakness. The symptoms are aggravated by nothing. She has tried nothing for the symptoms.    Past Medical History  Diagnosis Date  . HLD (hyperlipidemia)   . Depression     followed by psych  . Chronic pain due to injury     at R ankle due to ankle fracture  . Imbalance     likely multifactorial (s/p balance training at [facility] 08/2010)  . Right ankle pain 2003    trimalleolar fx s/p fusion  .  Hypertension     Past Surgical History  Procedure Date  . Ankle fusion 02/2004    Right-50% disability   . Orif ankle fracture 06/2003    Right    Family History  Problem Relation Age of Onset  . Atrial fibrillation Father   . Hypertension Father   . Osteoarthritis Father   . Aortic aneurysm Father   . Depression Mother     V-tach; arrhythmia  . Hepatitis Brother     Hep. C  . Anemia Sister     History  Substance Use Topics  . Smoking status: Former Smoker    Quit date: 08/12/1995  . Smokeless tobacco: Not on file  . Alcohol Use: Yes     Rare-social    OB History    Grav Para Term Preterm Abortions TAB SAB Ect Mult Living                  Review of Systems  Constitutional: Negative for fever, chills and diaphoresis.  HENT: Negative for nosebleeds, congestion, sore throat, facial swelling, rhinorrhea, neck pain and neck stiffness.   Eyes: Negative for photophobia and discharge.  Respiratory: Negative for cough and shortness of breath.   Cardiovascular: Positive for syncope. Negative for chest pain, palpitations and leg swelling.  Gastrointestinal: Negative for nausea, vomiting, abdominal pain, diarrhea and constipation.  Genitourinary: Negative for dysuria.  Musculoskeletal: Negative for myalgias.  Skin: Negative for pallor and rash.  Neurological: Positive for syncope. Negative for dizziness, vertigo,  seizures, speech difficulty, weakness, numbness and headaches.  Psychiatric/Behavioral: Positive for confusion.    Allergies  Review of patient's allergies indicates no known allergies.  Home Medications   Current Outpatient Rx  Name Route Sig Dispense Refill  . ACETAMINOPHEN 500 MG PO TABS Oral Take 500 mg by mouth as needed.      . ARIPIPRAZOLE 30 MG PO TABS Oral Take 30 mg by mouth at bedtime.      . BUPROPION HCL ER (SR) 150 MG PO TB12 Oral Take 450 mg by mouth daily.      . CYCLOBENZAPRINE HCL 5 MG PO TABS Oral Take 1 tablet (5 mg total) by mouth every  8 (eight) hours as needed for muscle spasms. 30 tablet 1  . FERROUS SULFATE 325 (65 FE) MG PO TBEC Oral Take 1 tablet (325 mg total) by mouth 2 (two) times daily. 60 tablet 6    Pt needs to call for appt.  Marland Kitchen LAMOTRIGINE 150 MG PO TABS Oral Take 150 mg by mouth at bedtime.      Marland Kitchen NAPROXEN SODIUM 220 MG PO TABS Oral Take 440 mg by mouth 2 (two) times daily with a meal.      . SIMVASTATIN 40 MG PO TABS Oral Take 1 tablet (40 mg total) by mouth at bedtime. 30 tablet 3  . VITAMIN B-12 1000 MCG PO TABS Oral Take 1,000 mcg by mouth daily.        BP 140/100  Pulse 121  Temp(Src) 98.9 F (37.2 C) (Oral)  Resp 18  Ht 5\' 7"  (1.702 m)  Wt 180 lb (81.647 kg)  BMI 28.19 kg/m2  SpO2 97%  LMP 08/14/2011  Physical Exam  Nursing note and vitals reviewed. Constitutional: She is oriented to person, place, and time. She appears well-developed and well-nourished.  HENT:  Head: Normocephalic.    Right Ear: External ear normal.  Left Ear: External ear normal.  Mouth/Throat: Oropharynx is clear and moist.       Visible hematoma right frontal, multiple minor abrasions to face.   Eyes: EOM are normal. Pupils are equal, round, and reactive to light.  Neck: Normal range of motion. Neck supple.  Cardiovascular: Normal rate and regular rhythm.   No murmur heard. Pulmonary/Chest: Effort normal and breath sounds normal. She has no wheezes. She exhibits no tenderness.  Abdominal: Soft. Bowel sounds are normal.  Musculoskeletal: Normal range of motion. She exhibits no edema.  Neurological: She is alert and oriented to person, place, and time. No cranial nerve deficit or sensory deficit. She exhibits normal muscle tone. Coordination normal. GCS eye subscore is 4. GCS verbal subscore is 5. GCS motor subscore is 6.  Skin: Skin is warm and dry. No erythema.  Psychiatric: She has a normal mood and affect.    ED Course  Procedures (including critical care time)  Labs Reviewed  CBC - Abnormal; Notable for the  following:    Hemoglobin 15.2 (*)    All other components within normal limits  DIFFERENTIAL - Abnormal; Notable for the following:    Neutrophils Relative 80 (*)    Neutro Abs 7.9 (*)    Lymphocytes Relative 11 (*)    All other components within normal limits  BASIC METABOLIC PANEL - Abnormal; Notable for the following:    Glucose, Bld 105 (*)    All other components within normal limits  URINALYSIS, ROUTINE W REFLEX MICROSCOPIC  POCT PREGNANCY, URINE  POCT PREGNANCY, URINE   Ct Head Wo Contrast  08/19/2011  *  RADIOLOGY REPORT*  Clinical Data: Seizure or syncopal episode.  Fall striking head on window cell.  Numerous abrasions to the face.  Headache near the vertex.  CT HEAD WITHOUT CONTRAST  Technique:  Contiguous axial images were obtained from the base of the skull through the vertex without contrast.  Comparison: None.  Findings: Mild generalized atrophy is advanced for age.  There is moderate periventricular subcortical white matter hypoattenuation. There is asymmetric enlargement of the frontal horn the left lateral ventricle with associated white matter change.  No acute cortical infarct, hemorrhage, or mass lesion is present.  The paranasal sinuses and mastoid air cells are clear.  The osseous skull is intact.  No significant extra cranial soft tissue injury is evident.  IMPRESSION:  1.  Advanced white matter changes. The finding is nonspecific but can be seen in the setting of chronic microvascular ischemia, a demyelinating process such as multiple sclerosis, vasculitis, complicated migraine headaches, or as the sequelae of a prior infectious or inflammatory process. 2.  Mild generalized atrophy is advanced for age. 3.  No acute intracranial abnormality.  Original Report Authenticated By: Jamesetta Orleans. MATTERN, M.D.     No diagnosis found.  4:56 PM patient seen and examined. Discussed with Dr. Ranae Palms. CT ordered given question of seizure versus syncopal episode. Patient is  persistently tachycardic. Pt discussed with Schinlever PA-C and Dr. Judd Lien. Patient will be re-evaluated after fluid bolus. Consider admission if lightheaded/persistently tachycardic. Otherwise d/c to home with PCP follow-up if improved.   MDM  Patient with syncopal episode. Suspect shaking was likely 2/2 to syncope. Pt is tachycardic. Will give fluids and reassess.        Carolee Rota, [place] 08/19/11 1700

## 2011-08-19 NOTE — ED Notes (Signed)
Pt rolled, using spinal precautions with backboard removed.  C-collar removed by PA, pt tolerated well.

## 2011-08-19 NOTE — Progress Notes (Signed)
Patient arrived to unit per [employer]. Patient is Alert and Oriented X4, RA.  Patient ambulated from stretcher to bed with no signs of respiratory or cardiac distress.  Patient is unsteady on her feet however she does need an assist. Skin is intact however there are scattered bruises to lower and upper extremities.  Red marks are noted to her face and a discolored bruise to the left of her lower back.  Patient does not voice any verbal complaints or requests at this time. Medications have been tolerated thus far.  Will continue to monitor patient during the course of the shift.  Normal Saline infusing at this time.

## 2011-08-20 ENCOUNTER — Inpatient Hospital Stay (HOSPITAL_COMMUNITY): Payer: Medicare Other

## 2011-08-20 ENCOUNTER — Ambulatory Visit (HOSPITAL_COMMUNITY): Payer: Medicare Other

## 2011-08-20 DIAGNOSIS — G40909 Epilepsy, unspecified, not intractable, without status epilepticus: Secondary | ICD-10-CM | POA: Diagnosis present

## 2011-08-20 DIAGNOSIS — R55 Syncope and collapse: Secondary | ICD-10-CM

## 2011-08-20 LAB — CSF CULTURE W GRAM STAIN

## 2011-08-20 LAB — COMPREHENSIVE METABOLIC PANEL
ALT: 22 U/L (ref 0–35)
AST: 22 U/L (ref 0–37)
CO2: 23 mEq/L (ref 19–32)
Calcium: 8.6 mg/dL (ref 8.4–10.5)
Chloride: 108 mEq/L (ref 96–112)
GFR calc Af Amer: >90 mL/min (ref >90)
GFR calc non Af Amer: >90 mL/min (ref >90)
Glucose, Bld: 86 mg/dL (ref 70–99)
Sodium: 140 mEq/L (ref 135–145)
Total Bilirubin: 0.3 mg/dL (ref 0.3–1.2)

## 2011-08-20 LAB — RAPID URINE DRUG SCREEN, HOSP PERFORMED
Amphetamines: NOT DETECTED
Benzodiazepines: NOT DETECTED
Cocaine: NOT DETECTED
Opiates: NOT DETECTED
Tetrahydrocannabinol: NOT DETECTED

## 2011-08-20 LAB — CRYPTOCOCCAL ANTIGEN, CSF

## 2011-08-20 LAB — PROTIME-INR
INR: 1.02 (ref 0.00–1.49)
Prothrombin Time: 13.6 seconds (ref 11.6–15.2)

## 2011-08-20 LAB — CBC
Hemoglobin: 12.8 g/dL (ref 12.0–15.0)
MCH: 33 pg (ref 26.0–34.0)
MCHC: 33.3 g/dL (ref 30.0–36.0)
Platelets: 199 10*3/uL (ref 150–400)
RDW: 12.7 % (ref 11.5–15.5)

## 2011-08-20 LAB — PROTEIN AND GLUCOSE, CSF: Total  Protein, CSF: 19 mg/dL (ref 15–45)

## 2011-08-20 LAB — CSF CELL COUNT WITH DIFFERENTIAL: Tube #: 1

## 2011-08-20 LAB — GLUCOSE, CAPILLARY

## 2011-08-20 MED ORDER — LORAZEPAM 2 MG/ML IJ SOLN: 1.0000 mg | Freq: Three times a day (TID) | INTRAMUSCULAR | Status: DC | PRN

## 2011-08-20 MED ORDER — TRAMADOL HCL 50 MG PO TABS
50.0000 mg | ORAL_TABLET | Freq: Once | ORAL | Status: AC
  Administered 2011-08-20: 50 mg via ORAL
  Filled 2011-08-20: qty 1

## 2011-08-20 MED ORDER — ZOLPIDEM TARTRATE 5 MG PO TABS
5.0000 mg | ORAL_TABLET | Freq: Once | ORAL | Status: AC
  Administered 2011-08-20: 5 mg via ORAL
  Filled 2011-08-20: qty 1

## 2011-08-20 MED ORDER — POTASSIUM CHLORIDE CRYS ER 20 MEQ PO TBCR
40.0000 meq | EXTENDED_RELEASE_TABLET | Freq: Once | ORAL | Status: AC
  Administered 2011-08-20: 40 meq via ORAL
  Filled 2011-08-20: qty 2

## 2011-08-20 NOTE — Procedures (Signed)
Informed consent was obtained from the patient prior to the procedure, including potential complications of headache, allergy, and pain.   With the patient prone, the lower back was prepped with Betadine.  1% Lidocaine was used for local anesthesia.  Lumbar puncture was performed at the [L2-3] level using a [20] gauge needle with return of [clear] CSF with an opening pressure of [22] cm water.   [6-8] ml of CSF were obtained for laboratory studies.  The patient tolerated the procedure well and there were no apparent complications.

## 2011-08-20 NOTE — Progress Notes (Signed)
Medications tolerated during the course of the shift no signs of respiratory or cardiac distress.  Patient free from injury, room is intact and clutter free.  No other verbal complaints or requests.  Call bell in reach and bed alarm engaged.

## 2011-08-20 NOTE — Progress Notes (Addendum)
Patient ID: Jamie Patterson, female   DOB: 1960-11-16, 51 y.o.   MRN: [id number] Subjective: No events overnight. I was called by RN that patient had a tonic- clonic seizure which as per patient's son lasted less than a minute and patient was slightly confuse afterward. Patient does not recall what happened prior to seizure. Her son reports she took a bath prior to this event and patient does not recall it. At present patient is alert and awake and not confused. 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  Objective:  Vital signs in last 24 hours:  Filed Vitals:   08/19/11 2206 08/19/11 2211 08/19/11 2300 08/20/11 0500  BP: 127/84 136/91  100/64  Pulse: 130 137  88  Temp:    98.8 F (37.1 C)  TempSrc:      Resp:    20  Height:   5\' 7"  (1.702 m)   Weight:   53.706 kg (118 lb 6.4 oz)   SpO2:    98%    Intake/Output from previous day:  No intake or output data in the 24 hours ending 08/20/11 0913  Physical Exam: General: Alert, awake, oriented x3, in no acute distress. HEENT: No bruits, no goiter. Moist mucous membranes, no scleral icterus, no conjunctival pallor. Heart: Regular rate and rhythm, S1/S2 +, no murmurs, rubs, gallops. Lungs: Clear to auscultation bilaterally. No wheezing, no rhonchi, no rales.  Abdomen: Soft, nontender, nondistended, positive bowel sounds. Extremities: No clubbing or cyanosis, no pitting edema,  positive pedal pulses. Neuro: Grossly nonfocal.  Lab Results:  Basic Metabolic Panel:    Component Value Date/Time   NA 140 08/20/2011 0530   K 3.2* 08/20/2011 0530   CL 108 08/20/2011 0530   CO2 23 08/20/2011 0530   BUN 6 08/20/2011 0530   CREATININE 0.54 08/20/2011 0530   GLUCOSE 86 08/20/2011 0530   CALCIUM 8.6 08/20/2011 0530   CBC:    Component Value Date/Time   WBC 6.3 08/20/2011 0530   HGB 12.8 08/20/2011 0530   HCT 38.4 08/20/2011 0530   PLT 199 08/20/2011 0530   MCV 99.0 08/20/2011 0530   NEUTROABS 7.9*  08/19/2011 1527   LYMPHSABS 1.1 08/19/2011 1527   MONOABS 0.8 08/19/2011 1527   EOSABS 0.1 08/19/2011 1527   BASOSABS 0.0 08/19/2011 1527      Lab 08/20/11 0530 08/19/11 1527  WBC 6.3 9.9  HGB 12.8 15.2*  HCT 38.4 43.4  PLT 199 248  MCV 99.0 97.1  MCH 33.0 34.0  MCHC 33.3 35.0  RDW 12.7 12.3  LYMPHSABS -- 1.1  MONOABS -- 0.8  EOSABS -- 0.1  BASOSABS -- 0.0  BANDABS -- --    Lab 08/20/11 0530 08/19/11 1527  NA 140 138  K 3.2* 3.5  CL 108 104  CO2 23 24  GLUCOSE 86 105*  BUN 6 6  CREATININE 0.54 0.53  CALCIUM 8.6 9.7  MG -- --    Lab 08/20/11 0530  INR 1.02  PROTIME --   Cardiac markers:  Lab 08/20/11 0530  CKMB --  TROPONINI <0.30  MYOGLOBIN --   No components found with this basename: POCBNP:3 No results found for this or any previous visit (from the past 240 hour(s)).  Studies/Results: Ct Head Wo Contrast  08/19/2011  *RADIOLOGY REPORT*  Clinical Data: Seizure or syncopal episode.  Fall striking head on window cell.  Numerous abrasions to the face.  Headache near the vertex.  CT HEAD  WITHOUT CONTRAST  Technique:  Contiguous axial images were obtained from the base of the skull through the vertex without contrast.  Comparison: None.  Findings: Mild generalized atrophy is advanced for age.  There is moderate periventricular subcortical white matter hypoattenuation. There is asymmetric enlargement of the frontal horn the left lateral ventricle with associated white matter change.  No acute cortical infarct, hemorrhage, or mass lesion is present.  The paranasal sinuses and mastoid air cells are clear.  The osseous skull is intact.  No significant extra cranial soft tissue injury is evident.  IMPRESSION:  1.  Advanced white matter changes. The finding is nonspecific but can be seen in the setting of chronic microvascular ischemia, a demyelinating process such as multiple sclerosis, vasculitis, complicated migraine headaches, or as the sequelae of a prior infectious or  inflammatory process. 2.  Mild generalized atrophy is advanced for age. 3.  No acute intracranial abnormality.  Original Report Authenticated By: Jamesetta Orleans. MATTERN, M.D.   Mr Laqueta Jean ZO Contrast  08/20/2011  *RADIOLOGY REPORT*  Clinical Data: Syncope.  Possible seizure  MRI HEAD WITHOUT AND WITH CONTRAST  Technique:  Multiplanar, multiecho pulse sequences of the brain and surrounding structures were obtained according to standard protocol without and with intravenous contrast  Contrast: 18mL MULTIHANCE GADOBENATE DIMEGLUMINE 529 MG/ML IV SOLN  Comparison: CT head 08/19/2011  Findings: Negative for acute infarct.  Negative for mass lesion.  Patchy hypodensities are noted in the periventricular deep white matter bilaterally.  Hyperintensity is noted in the white matter at the callosal septal interface.  These findings may be due to demyelinating disease versus chronic microvascular ischemia.  Negative for intracranial hemorrhage or fluid collection. Postcontrast imaging of the brain reveals no enhancing lesion. Brainstem is normal.  Minimal mucosal thickening in the maxillary sinuses bilaterally.  IMPRESSION: No acute intracranial abnormality.  Hyperintensity in the cerebral white matter bilaterally is nonspecific.  This is most likely related to chronic microvascular ischemia however multiple sclerosis is in the differential.  Original Report Authenticated By: Camelia Phenes, M.D.    Medications: Scheduled Meds:   . aspirin EC  81 mg Oral QPM  . buPROPion  450 mg Oral Q0700  . docusate sodium  100 mg Oral BID  . FLUoxetine  80 mg Oral Q0700  . ketorolac  30 mg Intravenous Once  . lamoTRIgine  150 mg Oral Q0700  . metoCLOPramide (REGLAN) injection  10 mg Intravenous Once  . potassium chloride  40 mEq Oral Once  . senna  1 tablet Oral BID  . simvastatin  40 mg Oral QHS  . sodium chloride  1,000 mL Intravenous Once  . sodium chloride  1,000 mL Intravenous Once  . traMADol  50 mg Oral Once  .  DISCONTD: sodium chloride   Intravenous STAT   Continuous Infusions:   . sodium chloride 100 mL/hr at 08/19/11 2322   PRN Meds:.acetaminophen, acetaminophen, gadobenate dimeglumine, LORazepam, ondansetron (ZOFRAN) IV, ondansetron  Assessment/Plan:  Principal Problem:   *Seizure disorder - unclear etiology -  appreciate neurology  Input  Active Problems:   Syncope and collapse - based on patient's history of event on admission looks like it was secondary to sezire episode - we will get EEG and urine toxicology    Tachycardia - unclear etiology but resolved at present - as per patient her heart rate was 120 after seizure episode    EDUCATION - test results and diagnostic studies were discussed with patient and pt's family who was present  at the bedside - patient and family have verbalized the understanding - questions were answered at the bedside and contact information was provided for additional questions or concerns   LOS: 1 day   Cabrini Ruggieri 08/20/2011, 9:13 AM  [facility] Pager: [phone]

## 2011-08-20 NOTE — Progress Notes (Signed)
  Echocardiogram 2D Echocardiogram has been performed.  Jamie Patterson [facility] 08/20/2011, 3:37 PM

## 2011-08-20 NOTE — Progress Notes (Addendum)
                                       [facility] PROGRESS NOTE    SUBJECTIVE   Patient is alert and oriented.  Appears anxious.  No other complaints.  OBJECTIVE    Past Medical History  Diagnosis Date  . HLD (hyperlipidemia)   . Depression     followed by psych  . Chronic pain due to injury     at R ankle due to ankle fracture  . Imbalance     likely multifactorial (s/p balance training at [facility] 08/2010)  . Right ankle pain 2003    trimalleolar fx s/p fusion  . Hypertension   . Iron deficiency anemia     Neurologic Exam:  Mental Status: Alert, oriented, thought content appropriate.  Speech fluent without evidence of aphasia. Able to follow 3 step commands without difficulty. Cranial Nerves: II-Visual fields grossly intact. III/IV/VI-Extraocular movements intact.  Pupils reactive bilaterally. V/VII-Smile symmetric VIII-grossly intact IX/X-normal gag XI-bilateral shoulder shrug XII-midline tongue extension Motor: 5/5 bilaterally with normal tone and bulk Sensory: Pinprick and light touch intact throughout, bilaterally Deep Tendon Reflexes: 2+ and symmetric throughout Plantars: Downgoing bilaterally Cerebellar: Normal finger-to-nose, normal rapid alternating movements and normal heel-to-shin test.    Lab Results: Lab Results  Component Value Date/Time   CHOL 194 02/13/2010  8:50 AM   Lipid Panel No results found for this basename: CHOL,TRIG,HDL,CHOLHDL,VLDL,LDLCALC in the last 72 hours  Studies/Results:   Medications:     Scheduled:   . aspirin EC  81 mg Oral QPM  . buPROPion  450 mg Oral Q0700  . docusate sodium  100 mg Oral BID  . FLUoxetine  80 mg Oral Q0700  . ketorolac  30 mg Intravenous Once  . lamoTRIgine  150 mg Oral Q0700  . metoCLOPramide (REGLAN) injection  10 mg Intravenous Once  . potassium chloride  40 mEq Oral Once  . senna  1 tablet Oral BID  . simvastatin  40 mg Oral QHS  . sodium chloride  1,000 mL Intravenous Once  .  sodium chloride  1,000 mL Intravenous Once  . traMADol  50 mg Oral Once  . DISCONTD: sodium chloride   Intravenous STAT    Assessment/Plan:    Patient Active Hospital Problem List: Seizure disorder (08/20/2011)   Assessment: Patient is AOX3 at this time but per note had a seizure event this AM.  Still unclear of etiology of seizures.  Continues Lamictal 150 daily.  Would consider tapering off of Wellbutrin as this can lower seizure threshold.    Plan: I have ordered LP by flouro.  EEG is pending    Felicie Morn PA-C [facility] [phone]  08/20/2011, 1:06 PM

## 2011-08-20 NOTE — Progress Notes (Signed)
Utilization Review Completed.  Jamie Patterson T  08/20/2011 

## 2011-08-20 NOTE — Plan of Care (Signed)
Problem: Phase I Progression Outcomes Goal: Maintaining airway and VS stable Outcome: Progressing Patients has had a patent airway and vitals have been stable during the course of the shift Goal: Other Phase I Outcomes/Goals Outcome: Progressing Patient has remained free from injury room intact and clutter free during the course of the night  Problem: Phase II Progression Outcomes Goal: Pain controlled Outcome: Progressing Upon reassessment of pain patient was sleeping

## 2011-08-20 NOTE — Progress Notes (Addendum)
Called by RN to assist with patient with seizure activity.  RRT RN working with another patient and unable to respond at this point.  Asked RN to call MD now.  Talked with RN = guided assessment over phone - she reports patient now awake  talking - VSS - suction to room for safety - patients airway stable.  Went to see patient at 69 - alert = NAD - at baseline neuro - RR and unlabored - MD at bedside. Checked CBG 139.  Call for further assistance.

## 2011-08-20 NOTE — Progress Notes (Signed)
Pt was eating breakfast biscuit that son brought in when nurse tech noticed seizure like activity. Nurse was notified. Pt was witnessed having seizure activity by nurse. Pt began to dry heave and was suctioned to get remaining biscuit out of mouth. BP elevated in 170s/90s. HR increased to 150s. O2 sats remained 95-96%. Rapid response and MD were notified. When patient came to she was unable to remember what happened prior to episode. BP decreased to 140s/70s. HR decreased 120s ST. O2 sats 96% room air. CBG 139.Suction remains at bedside. MD currently at bedside with patient. Will continue to monitor closely.

## 2011-08-21 ENCOUNTER — Inpatient Hospital Stay (HOSPITAL_COMMUNITY): Payer: Medicare Other

## 2011-08-21 LAB — BASIC METABOLIC PANEL
BUN: 5 mg/dL — ABNORMAL LOW (ref 6–23)
Calcium: 9 mg/dL (ref 8.4–10.5)
Creatinine, Ser: 0.52 mg/dL (ref 0.50–1.10)
GFR calc non Af Amer: >90 mL/min (ref >90)
Glucose, Bld: 91 mg/dL (ref 70–99)
Potassium: 3.6 mEq/L (ref 3.5–5.1)

## 2011-08-21 LAB — CBC
HCT: 40.1 % (ref 36.0–46.0)
Hemoglobin: 13.4 g/dL (ref 12.0–15.0)
MCH: 33 pg (ref 26.0–34.0)
MCHC: 33.4 g/dL (ref 30.0–36.0)
MCV: 98.8 fL (ref 78.0–100.0)

## 2011-08-21 MED ORDER — LEVETIRACETAM 500 MG PO TABS: 500.0000 mg | ORAL_TABLET | Freq: Two times a day (BID) | ORAL | Status: DC

## 2011-08-21 MED ORDER — ENSURE CLINICAL ST REVIGOR PO LIQD: 237.0000 mL | Freq: Two times a day (BID) | ORAL | Status: DC

## 2011-08-21 MED ORDER — LEVETIRACETAM 500 MG PO TABS
500.0000 mg | ORAL_TABLET | Freq: Two times a day (BID) | ORAL | Status: DC
  Administered 2011-08-21: 500 mg via ORAL
  Filled 2011-08-21 (×2): qty 1

## 2011-08-21 NOTE — Discharge Summary (Signed)
Patient ID: Jamie Patterson MRN: [id number] DOB/AGE: 50-Jun-1962 57 y.o.  Admit date: 08/19/2011 Discharge date: 08/21/2011  Primary Care Physician:  [name], MD, MD  Discharge Diagnoses:    Present on Admission:  .Syncope and collapse .Tachycardia .Seizure disorder  Principal Problem:  *Seizure disorder Active Problems:  Syncope and collapse  Tachycardia   Current Discharge Medication List    START taking these medications   Details  levETIRAcetam (KEPPRA) 500 MG tablet Take 1 tablet (500 mg total) by mouth 2 (two) times daily. Qty: 60 tablet, Refills: 0      CONTINUE these medications which have NOT CHANGED   Details  acetaminophen (TYLENOL) 500 MG tablet Take 500 mg by mouth as needed.     aspirin EC 81 MG tablet Take 81 mg by mouth every evening.      buPROPion (WELLBUTRIN XL) 150 MG 24 hr tablet Take 450 mg by mouth every morning.      calcium-vitamin D (OSCAL WITH D) 500-200 MG-UNIT per tablet Take 1 tablet by mouth every evening.      ferrous sulfate 325 (65 FE) MG EC tablet Take 1 tablet (325 mg total) by mouth 2 (two) times daily. Qty: 60 tablet, Refills: 6    FLUoxetine (PROZAC) 40 MG capsule Take 80 mg by mouth every morning.      lamoTRIgine (LAMICTAL) 150 MG tablet Take 150 mg by mouth every morning.     naproxen sodium (ANAPROX) 220 MG tablet Take 440 mg by mouth 2 (two) times daily with a meal.      OVER THE COUNTER MEDICATION Take 1 capsule by mouth daily. instaflex joint support     simvastatin (ZOCOR) 40 MG tablet Take 1 tablet (40 mg total) by mouth at bedtime. Qty: 30 tablet, Refills: 3    vitamin B-12 (CYANOCOBALAMIN) 1000 MCG tablet Take 1,000 mcg by mouth daily.          Disposition and Follow-up: with PCP in 1 week  Consults:  neurology  Significant Diagnostic Studies:  Ct Head Wo Contrast  08/19/2011  *RADIOLOGY REPORT*  Clinical Data: Seizure or syncopal episode.  Fall striking head on window cell.  Numerous abrasions to the  face.  Headache near the vertex.  CT HEAD WITHOUT CONTRAST  Technique:  Contiguous axial images were obtained from the base of the skull through the vertex without contrast.  Comparison: None.  Findings: Mild generalized atrophy is advanced for age.  There is moderate periventricular subcortical white matter hypoattenuation. There is asymmetric enlargement of the frontal horn the left lateral ventricle with associated white matter change.  No acute cortical infarct, hemorrhage, or mass lesion is present.  The paranasal sinuses and mastoid air cells are clear.  The osseous skull is intact.  No significant extra cranial soft tissue injury is evident.  IMPRESSION:  1.  Advanced white matter changes. The finding is nonspecific but can be seen in the setting of chronic microvascular ischemia, a demyelinating process such as multiple sclerosis, vasculitis, complicated migraine headaches, or as the sequelae of a prior infectious or inflammatory process. 2.  Mild generalized atrophy is advanced for age. 3.  No acute intracranial abnormality.  Original Report Authenticated By: Jamesetta Orleans. MATTERN, M.D.   Mr Laqueta Jean HQ Contrast  08/20/2011  *RADIOLOGY REPORT*  Clinical Data: Syncope.  Possible seizure  MRI HEAD WITHOUT AND WITH CONTRAST  Technique:  Multiplanar, multiecho pulse sequences of the brain and surrounding structures were obtained according to standard protocol without and with intravenous  contrast  Contrast: 18mL MULTIHANCE GADOBENATE DIMEGLUMINE 529 MG/ML IV SOLN  Comparison: CT head 08/19/2011  Findings: Negative for acute infarct.  Negative for mass lesion.  Patchy hypodensities are noted in the periventricular deep white matter bilaterally.  Hyperintensity is noted in the white matter at the callosal septal interface.  These findings may be due to demyelinating disease versus chronic microvascular ischemia.  Negative for intracranial hemorrhage or fluid collection. Postcontrast imaging of the brain reveals  no enhancing lesion. Brainstem is normal.  Minimal mucosal thickening in the maxillary sinuses bilaterally.  IMPRESSION: No acute intracranial abnormality.  Hyperintensity in the cerebral white matter bilaterally is nonspecific.  This is most likely related to chronic microvascular ischemia however multiple sclerosis is in the differential.  Original Report Authenticated By: Camelia Phenes, M.D.   Dg Chest Port 1 View  08/20/2011  *RADIOLOGY REPORT*  Clinical Data: Seizure, vomiting, chest tightness, aspiration  PORTABLE CHEST - 1 VIEW  Comparison: Portable exam 1110 hours without priors for comparison.  Findings: Normal heart size, mediastinal contours, and pulmonary vascularity. Peribronchial thickening without infiltrate or effusion. No pneumothorax. Bones unremarkable.  IMPRESSION: Minimal bronchitic changes without infiltrate.  Original Report Authenticated By: Lollie Marrow, M.D.    Brief H and P:  50yoF with h/o IDA, depression, chronic pain due to R ankle fracture, dysequilibrium presented with sudden onset syncope and collapse. Patient denied any prodromal signs or symptoms,  no SOB, CP, h/a's, dizziness, lightheadedness, tingling, numbness, focal neuro  deficits, slurred speech, facial droop. There were no sudden positional changes; she denied decreased PO intake or feeling dehydrated beforehand. Per report, she went down and had jerking motions although this is not substantiated, however the son  who is a firefighter/EMS trained has said he thought it was a seizure.   Physical Exam on Discharge:  Filed Vitals:   08/20/11 1300 08/20/11 1555 08/20/11 2100 08/21/11 0500  BP: 116/78  123/83 125/73  Pulse: 107  90 83  Temp: 98.3 F (36.8 C) 98.4 F (36.9 C) 98.3 F (36.8 C) 97.8 F (36.6 C)  TempSrc: Oral  Oral Oral  Resp: 18  18 20   Height:      Weight:      SpO2: 95%  98% 98%     Intake/Output Summary (Last 24 hours) at 08/21/11 1115 Last data filed at 08/21/11 0900  Gross per  24 hour  Intake 3903.33 ml  Output      0 ml  Net 3903.33 ml    General: Alert, awake, oriented x3, in no acute distress. HEENT: No bruits, no goiter. Heart: Regular rate and rhythm, without murmurs, rubs, gallops. Lungs: Clear to auscultation bilaterally. Abdomen: Soft, nontender, nondistended, positive bowel sounds. Extremities: No clubbing cyanosis or edema with positive pedal pulses. Neuro: Grossly intact, nonfocal.  CBC:    Component Value Date/Time   WBC 6.4 08/21/2011 0545   HGB 13.4 08/21/2011 0545   HCT 40.1 08/21/2011 0545   PLT 204 08/21/2011 0545   MCV 98.8 08/21/2011 0545   NEUTROABS 7.9* 08/19/2011 1527   LYMPHSABS 1.1 08/19/2011 1527   MONOABS 0.8 08/19/2011 1527   EOSABS 0.1 08/19/2011 1527   BASOSABS 0.0 08/19/2011 1527    Basic Metabolic Panel:    Component Value Date/Time   NA 138 08/21/2011 0545   K 3.6 08/21/2011 0545   CL 106 08/21/2011 0545   CO2 25 08/21/2011 0545   BUN 5* 08/21/2011 0545   CREATININE 0.52 08/21/2011 0545   GLUCOSE 91 08/21/2011  0545   CALCIUM 9.0 08/21/2011 0545    Assessment/Plan:   Principal Problem:   *Seizure disorder  - unclear etiology  - as per neuro Keppra will be started  Active Problems:   Syncope and collapse  - based on patient's history of event on admission looks like it was secondary to sezire episode  - Keppra started today 500 mg BID  Tachycardia  - unclear etiology but resolved at present   EDUCATION  - test results and diagnostic studies were discussed with patient and pt's family who was present at the bedside  - patient and family have verbalized the understanding  - questions were answered at the bedside and contact information was provided for additional questions or concerns   DISPOSITION - patient is medically stable to be discharged home   Time spent on Discharge: Greater than 30 minutes  Signed: DEVINE, ALMA 08/21/2011, 11:15 AM

## 2011-08-21 NOTE — Progress Notes (Signed)
Subjective: Seizure yesterday morning. Total of 2 seizures. On Wellbutrin and Lamictal for about 4 years, same dose. No new complaints.  Objective: Current vital signs: BP 125/73  Pulse 83  Temp(Src) 97.8 F (36.6 C) (Oral)  Resp 20  Ht 5\' 7"  (1.702 m)  Wt 53.706 kg (118 lb 6.4 oz)  BMI 18.54 kg/m2  SpO2 98%  LMP 08/14/2011 Vital signs in last 24 hours: Temp:  [97.8 F (36.6 C)-98.6 F (37 C)] 97.8 F (36.6 C) (01/10 0500) Pulse Rate:  [83-126] 83  (01/10 0500) Resp:  [18-20] 20  (01/10 0500) BP: (116-152)/(73-89) 125/73 mmHg (01/10 0500) SpO2:  [95 %-98 %] 98 % (01/10 0500)   Neurologic Exam: Awake, alert, no distress. Facial, tongue and cheek abrasions noted.   Recent Results (from the past 240 hour(s))  CSF CULTURE     Status: Normal (Preliminary result)   Collection Time   08/20/11  5:15 PM      Component Value Range Status Comment   Specimen Description CSF   Final    Special Requests 1CC   Final    Gram Stain     Final    Value: RARE WBC PRESENT,BOTH PMN AND MONONUCLEAR     NO ORGANISMS SEEN     CYTOSPIN   Culture PENDING   Incomplete    Report Status PENDING   Incomplete     Studies/Results: Ct Head Wo Contrast  08/19/2011  *RADIOLOGY REPORT*  Clinical Data: Seizure or syncopal episode.  Fall striking head on window cell.  Numerous abrasions to the face.  Headache near the vertex.  CT HEAD WITHOUT CONTRAST  Technique:  Contiguous axial images were obtained from the base of the skull through the vertex without contrast.  Comparison: None.  Findings: Mild generalized atrophy is advanced for age.  There is moderate periventricular subcortical white matter hypoattenuation. There is asymmetric enlargement of the frontal horn the left lateral ventricle with associated white matter change.  No acute cortical infarct, hemorrhage, or mass lesion is present.  The paranasal sinuses and mastoid air cells are clear.  The osseous skull is intact.  No significant extra cranial  soft tissue injury is evident.  IMPRESSION:  1.  Advanced white matter changes. The finding is nonspecific but can be seen in the setting of chronic microvascular ischemia, a demyelinating process such as multiple sclerosis, vasculitis, complicated migraine headaches, or as the sequelae of a prior infectious or inflammatory process. 2.  Mild generalized atrophy is advanced for age. 3.  No acute intracranial abnormality.  Original Report Authenticated By: Jamesetta Orleans. MATTERN, M.D.   Mr Laqueta Jean ZO Contrast  08/20/2011  *RADIOLOGY REPORT*  Clinical Data: Syncope.  Possible seizure  MRI HEAD WITHOUT AND WITH CONTRAST  Technique:  Multiplanar, multiecho pulse sequences of the brain and surrounding structures were obtained according to standard protocol without and with intravenous contrast  Contrast: 18mL MULTIHANCE GADOBENATE DIMEGLUMINE 529 MG/ML IV SOLN  Comparison: CT head 08/19/2011  Findings: Negative for acute infarct.  Negative for mass lesion.  Patchy hypodensities are noted in the periventricular deep white matter bilaterally.  Hyperintensity is noted in the white matter at the callosal septal interface.  These findings may be due to demyelinating disease versus chronic microvascular ischemia.  Negative for intracranial hemorrhage or fluid collection. Postcontrast imaging of the brain reveals no enhancing lesion. Brainstem is normal.  Minimal mucosal thickening in the maxillary sinuses bilaterally.  IMPRESSION: No acute intracranial abnormality.  Hyperintensity in the cerebral white matter  bilaterally is nonspecific.  This is most likely related to chronic microvascular ischemia however multiple sclerosis is in the differential.  Original Report Authenticated By: Camelia Phenes, M.D.   Dg Chest Port 1 View  08/20/2011  *RADIOLOGY REPORT*  Clinical Data: Seizure, vomiting, chest tightness, aspiration  PORTABLE CHEST - 1 VIEW  Comparison: Portable exam 1110 hours without priors for comparison.  Findings:  Normal heart size, mediastinal contours, and pulmonary vascularity. Peribronchial thickening without infiltrate or effusion. No pneumothorax. Bones unremarkable.  IMPRESSION: Minimal bronchitic changes without infiltrate.  Original Report Authenticated By: Lollie Marrow, M.D.   Dg Fluoro Guide Ndl Plc/bx  08/20/2011  *RADIOLOGY REPORT*  Clinical Data:  Seizure.  Evaluate for possible meningitis.  DIAGNOSTIC LUMBAR PUNCTURE UNDER FLUOROSCOPIC GUIDANCE  Fluoroscopy time:  0.5 minutes.  Technique:  Informed consent was obtained from the patient prior to the procedure, including potential complications of headache, allergy, and pain.   With the patient prone, the lower back was prepped with Betadine.  1% Lidocaine was used for local anesthesia. Lumbar puncture was performed at the L2-3 level using a 20 gauge needle with return of clear CSF with an opening pressure of 22 cm water.   6-8 ml of CSF were obtained for laboratory studies.  The patient tolerated the procedure well and there were no apparent complications.  IMPRESSION: Technically successful lumbar puncture with fluoroscopic guidance as described above.  Original Report Authenticated By: Cyndie Chime, M.D.    Medications:  Scheduled:   . aspirin EC  81 mg Oral QPM  . buPROPion  450 mg Oral Q0700  . docusate sodium  100 mg Oral BID  . FLUoxetine  80 mg Oral Q0700  . lamoTRIgine  150 mg Oral Q0700  . potassium chloride  40 mEq Oral Once  . senna  1 tablet Oral BID  . simvastatin  40 mg Oral QHS  . zolpidem  5 mg Oral Once    Assessment/Plan: New onset seizure disorder. Doubt if Wellbutrin is the culprit. CSF was normal.  Plan: 1) No change in Wellbutrin and Lamictal at this point.           2) Will add Keppra 500 mg b.i.d.           3) EEG pending  C.R. Roseanne Reno, MD [facility]  08/21/2011  8:02 AM

## 2011-08-21 NOTE — Procedures (Signed)
EEG NUMBER:  REFERRING PHYSICIAN:  Carlota Raspberry, MD  HISTORY:  A 51 year old female with syncope evaluated to rule out seizure.  MEDICATIONS:  Aspirin, Wellbutrin, Coreg, Prozac, Lamictal, Keppra, Senokot, Zocor.  CONDITIONS OF RECORDING:  This is a 16 channel EEG carried out with the patient in the awake state.  DESCRIPTION:  The muscle and movement artifact are noted during the tracing.  The waking background activity consists of a low-voltage, symmetrical, fairly well-organized 10 Hz alpha activity seen from the parieto-occipital and posterotemporal regions.  Low-voltage, fast activity, poorly organized was seen anteriorly at times, superimposed on more posterior rhythms.  A mixture of theta and alpha was seen from the central and temporal regions.  The patient does not drowse or sleep. Hypoventilation was performed and produced a mild buildup, but failed to elicit any abnormalities.  Intermittent photic stimulation was performed, but failed to elicit any change in the tracing.  IMPRESSION:  This is a normal awake EEG.  COMMENT:  An EEG with the patient sleep deprived to elicit drowse and light sleep may be desirable to further elicit possible seizure disorder.          ______________________________ Thana Farr, MD    [id number] D:  08/21/2011 22:30:18  T:  08/21/2011 23:03:56  Job #:  [id number]

## 2011-08-21 NOTE — Progress Notes (Signed)
Pt and family were provided with discharge instructions. Pt was provided with new medication prescription and was educated on when to take next. No questions at this time. VSS. Pt left floor with family in wheel chair. Pt verbalized understanding to f/u with MD in 1 week. Ramond Craver, RN

## 2011-08-21 NOTE — Progress Notes (Signed)
INITIAL ADULT NUTRITION ASSESSMENT Date: 08/21/2011   Time: 10:23 AM  Reason for Assessment: Nutrition Risk Report  ASSESSMENT: Female 51 y.o.  Dx: Seizure disorder  Hx:  Past Medical History  Diagnosis Date  . HLD (hyperlipidemia)   . Depression     followed by psych  . Chronic pain due to injury     at R ankle due to ankle fracture  . Imbalance     likely multifactorial (s/p balance training at [facility] 08/2010)  . Right ankle pain 2003    trimalleolar fx s/p fusion  . Hypertension   . Iron deficiency anemia     Related Meds:     . aspirin EC  81 mg Oral QPM  . buPROPion  450 mg Oral Q0700  . docusate sodium  100 mg Oral BID  . FLUoxetine  80 mg Oral Q0700  . lamoTRIgine  150 mg Oral Q0700  . levETIRAcetam  500 mg Oral BID  . senna  1 tablet Oral BID  . simvastatin  40 mg Oral QHS  . zolpidem  5 mg Oral Once    Ht: 5\' 7"  (170.2 cm)  Wt: 180 lb (81.6 kg)  Ideal Wt: 61.3 kg % Ideal Wt: 133%  Usual Wt: ~ 216 lb  % Usual Wt: 83%  BMI = 28.3 kg/m2  Food/Nutrition Related Hx: unintentional weight loss > 10 lbs within the last month per nutrition screen  Labs:  CMP     Component Value Date/Time   NA 138 08/21/2011 0545   K 3.6 08/21/2011 0545   CL 106 08/21/2011 0545   CO2 25 08/21/2011 0545   GLUCOSE 91 08/21/2011 0545   BUN 5* 08/21/2011 0545   CREATININE 0.52 08/21/2011 0545   CALCIUM 9.0 08/21/2011 0545   PROT 5.5* 08/20/2011 0530   ALBUMIN 3.3* 08/20/2011 0530   AST 22 08/20/2011 0530   ALT 22 08/20/2011 0530   ALKPHOS 71 08/20/2011 0530   BILITOT 0.3 08/20/2011 0530   GFRNONAA >90 08/21/2011 0545   GFRAA >90 08/21/2011 0545    I/O last 3 completed shifts: In: 4023.3 [P.O.:960; I.V.:3063.3] Out: -  Total I/O In: 360 [P.O.:360] Out: -   Diet Order: Regular  Supplements/Tube Feeding: N/A  IVF:    sodium chloride Last Rate: 100 mL/hr at 08/21/11 0806    Estimated Nutritional Needs:   Kcal: 1800-2000 Protein: 90-100 gm Fluid: 1.8-2.0 L  RD spoke with  pt re: nutrition hx -- states her appetite is good; she's lost ~ 30 lbs since November due to medication withdrawal (ie Valium & Oxycontin) and poor appetite ; PO intake 80-90% per flowsheet records; no skin breakdown noted; drinks Boost supplements at home; would like Ensure during hospital stay -- RD to order.  NUTRITION DIAGNOSIS: -Malnutrition (NI-5.2).  Status: Ongoing  RELATED TO: inadequate oral intake, medication withdrawal  AS EVIDENCE BY: 17% weight loss, < 50% intake of estimated energy requirement for > 5 days  MONITORING/EVALUATION(Goals): Goal: meet 90-100% of estimated nutrition needs to promote protein & energy repletion Monitor: PO intake, labs, weight, I/O's  EDUCATION NEEDS: -No education needs identified at this time  INTERVENTION:  Add Ensure Clinical Strength PO BID (350 kcals, 13 gm protein per 8 fl oz bottle)  RD to follow for nutrition care plan  Dietitian #: [phone]  DOCUMENTATION CODES Per approved criteria  -Severe malnutrition in the context of acute illness or injury    Alger Memos 08/21/2011, 10:23 AM

## 2011-08-22 NOTE — ED Provider Notes (Signed)
Medical screening examination/treatment/procedure(s) were performed by non-physician practitioner and as supervising physician I was immediately available for consultation/collaboration.   Geoffery Lyons, MD 08/22/11 0157

## 2011-08-22 NOTE — ED Provider Notes (Signed)
History     CSN: [id number]  Arrival date & time 08/19/11  1332   First MD Initiated Contact with Patient 08/19/11 1342      Chief Complaint  Patient presents with  . Loss of Consciousness    (Consider location/radiation/quality/duration/timing/severity/associated sxs/prior treatment) HPI  Past Medical History  Diagnosis Date  . HLD (hyperlipidemia)   . Depression     followed by psych  . Chronic pain due to injury     at R ankle due to ankle fracture  . Imbalance     likely multifactorial (s/p balance training at [facility] 08/2010)  . Right ankle pain 2003    trimalleolar fx s/p fusion  . Hypertension   . Iron deficiency anemia     Past Surgical History  Procedure Date  . Ankle fusion 02/2004    Right-50% disability   . Orif ankle fracture 06/2003    Right  . Cesarean section     Family History  Problem Relation Age of Onset  . Atrial fibrillation Father   . Hypertension Father   . Osteoarthritis Father   . Aortic aneurysm Father   . Depression Mother     V-tach; arrhythmia  . Hepatitis Brother     Hep. C  . Anemia Sister   . Arrhythmia Mother     Dx with Kirby Funk after having sudden collapse, did NOT have h/o AMI's     History  Substance Use Topics  . Smoking status: Former Smoker -- 1.0 packs/day for 20 years    Quit date: 08/12/1995  . Smokeless tobacco: Never Used  . Alcohol Use: Yes     Rare-social    OB History    Grav Para Term Preterm Abortions TAB SAB Ect Mult Living                  Review of Systems  Allergies  Review of patient's allergies indicates no known allergies.  Home Medications   Current Outpatient Rx  Name Route Sig Dispense Refill  . ACETAMINOPHEN 500 MG PO TABS Oral Take 500 mg by mouth as needed.     . ASPIRIN EC 81 MG PO TBEC Oral Take 81 mg by mouth every evening.      Marland Kitchen BUPROPION HCL ER (XL) 150 MG PO TB24 Oral Take 450 mg by mouth every morning.      Marland Kitchen CALCIUM CARBONATE-VITAMIN D 500-200 MG-UNIT PO TABS Oral Take 1  tablet by mouth every evening.      Marland Kitchen FERROUS SULFATE 325 (65 FE) MG PO TBEC Oral Take 1 tablet (325 mg total) by mouth 2 (two) times daily. 60 tablet 6    Pt needs to call for appt.  Marland Kitchen FLUOXETINE HCL 40 MG PO CAPS Oral Take 80 mg by mouth every morning.      Marland Kitchen LAMOTRIGINE 150 MG PO TABS Oral Take 150 mg by mouth every morning.     Marland Kitchen NAPROXEN SODIUM 220 MG PO TABS Oral Take 440 mg by mouth 2 (two) times daily with a meal.      . OVER THE COUNTER MEDICATION Oral Take 1 capsule by mouth daily. instaflex joint support     . SIMVASTATIN 40 MG PO TABS Oral Take 1 tablet (40 mg total) by mouth at bedtime. 30 tablet 3  . VITAMIN B-12 1000 MCG PO TABS Oral Take 1,000 mcg by mouth daily.      Marland Kitchen LEVETIRACETAM 500 MG PO TABS Oral Take 1 tablet (500 mg total) by  mouth 2 (two) times daily. 60 tablet 0    BP 125/73  Pulse 83  Temp(Src) 97.8 F (36.6 C) (Oral)  Resp 20  Ht 5\' 7"  (1.702 m)  Wt 118 lb 6.4 oz (53.706 kg)  BMI 18.54 kg/m2  SpO2 98%  LMP 08/14/2011  Physical Exam  ED Course  Procedures (including critical care time)  Labs Reviewed  CBC - Abnormal; Notable for the following:    Hemoglobin 15.2 (*)    All other components within normal limits  DIFFERENTIAL - Abnormal; Notable for the following:    Neutrophils Relative 80 (*)    Neutro Abs 7.9 (*)    Lymphocytes Relative 11 (*)    All other components within normal limits  BASIC METABOLIC PANEL - Abnormal; Notable for the following:    Glucose, Bld 105 (*)    All other components within normal limits  COMPREHENSIVE METABOLIC PANEL - Abnormal; Notable for the following:    Potassium 3.2 (*)    Total Protein 5.5 (*)    Albumin 3.3 (*)    All other components within normal limits  TSH - Abnormal; Notable for the following:    TSH 4.623 (*)    All other components within normal limits  GLUCOSE, CAPILLARY - Abnormal; Notable for the following:    Glucose-Capillary 139 (*)    All other components within normal limits  CSF CELL  COUNT WITH DIFFERENTIAL - Abnormal; Notable for the following:    Color, CSF PINK (*)    Appearance, CSF CLEAR (*)    RBC Count, CSF 1800 (*)    All other components within normal limits  BASIC METABOLIC PANEL - Abnormal; Notable for the following:    BUN 5 (*)    All other components within normal limits  URINALYSIS, ROUTINE W REFLEX MICROSCOPIC  POCT PREGNANCY, URINE  CBC  PROTIME-INR  TROPONIN I  ANA  RHEUMATOID FACTOR  URINE RAPID DRUG SCREEN (HOSP PERFORMED)  PROTEIN AND GLUCOSE, CSF  CRYPTOCOCCAL ANTIGEN, CSF  CSF CULTURE  RPR  CBC  MAGNESIUM  PHOSPHORUS  FUNGUS CULTURE W SMEAR  POCT PREGNANCY, URINE  MPO/PR-3 (ANCA) ANTIBODIES  OLIGOCLONAL BANDS, CSF + SERM  CSF IGG INDEX  B. BURGDORFI ANTIBODIES, CSF  GRAM STAIN  FUNGAL STAIN   No results found.   1. Syncope   2. Dehydration   3. Syncope and collapse   4. Tachycardia   5. Seizure disorder   6. HYPERCHOLESTEROLEMIA   7. DEPRESSION   8. ARTHRITIS, TRAUMATIC, ANKLE   9. DYSEQUILIBRIUM   10. ANEMIA, IRON DEFICIENCY       MDM  Medical screening examination/treatment/procedure(s) were performed by non-physician practitioner and as supervising physician I was immediately available for consultation/collaboration.        Loren Racer, MD 08/22/11 2007

## 2011-08-23 NOTE — ED Provider Notes (Signed)
Medical screening examination/treatment/procedure(s) were performed by non-physician practitioner and as supervising physician I was immediately available for consultation/collaboration.  Bijal Siglin, MD 08/23/11 1627 

## 2011-08-24 ENCOUNTER — Encounter: Payer: Self-pay | Admitting: Family Medicine

## 2011-08-26 LAB — B. BURGDORFI ANTIBODIES, CSF

## 2011-08-28 ENCOUNTER — Telehealth: Payer: Self-pay | Admitting: *Deleted

## 2011-08-28 ENCOUNTER — Encounter: Payer: Self-pay | Admitting: Family Medicine

## 2011-08-28 ENCOUNTER — Ambulatory Visit (INDEPENDENT_AMBULATORY_CARE_PROVIDER_SITE_OTHER): Payer: Medicare Other | Admitting: Family Medicine

## 2011-08-28 VITALS — BP 138/100 | HR 88 | Temp 98.7°F | Wt 182.2 lb

## 2011-08-28 DIAGNOSIS — E538 Deficiency of other specified B group vitamins: Secondary | ICD-10-CM | POA: Insufficient documentation

## 2011-08-28 DIAGNOSIS — G40909 Epilepsy, unspecified, not intractable, without status epilepticus: Secondary | ICD-10-CM

## 2011-08-28 DIAGNOSIS — Z23 Encounter for immunization: Secondary | ICD-10-CM

## 2011-08-28 DIAGNOSIS — R251 Tremor, unspecified: Secondary | ICD-10-CM

## 2011-08-28 DIAGNOSIS — F329 Major depressive disorder, single episode, unspecified: Secondary | ICD-10-CM

## 2011-08-28 DIAGNOSIS — E7800 Pure hypercholesterolemia, unspecified: Secondary | ICD-10-CM

## 2011-08-28 DIAGNOSIS — R259 Unspecified abnormal involuntary movements: Secondary | ICD-10-CM

## 2011-08-28 DIAGNOSIS — D509 Iron deficiency anemia, unspecified: Secondary | ICD-10-CM

## 2011-08-28 MED ORDER — SIMVASTATIN 40 MG PO TABS: 40.0000 mg | ORAL_TABLET | Freq: Every day | ORAL | Status: DC

## 2011-08-28 MED ORDER — FERROUS SULFATE 325 (65 FE) MG PO TBEC: 325.0000 mg | DELAYED_RELEASE_TABLET | Freq: Two times a day (BID) | ORAL | Status: DC

## 2011-08-28 NOTE — Progress Notes (Signed)
Subjective:    Patient ID: Jamie Patterson, female    DOB: [birth year], 51 y.o.   MRN: [id number]  HPI CC: f/u hospitalization  Presents with son who is a firefighter/EMT.  Hospitalization 08/19/2011 to 08/21/2011 for syncope thought 2/2 seizure, MRI - Hyperintensity in the cerebral white matter bilaterally is nonspecific, started on Keppra.  Tolerating keppra well.  Reviewed  Blood work:  EEG - normal.  Echo done as well normal, EF 60%.  LP done as well - cultures no growth, no oligoclonal bands.  Lyme titers negative.  RF and ANA negative.  RPR negative.  Cryptococcal Ag negative. UDS negative.  First seizure activity occurred at home with son nearby - pt made noise then fell down, hitting head on windowsill, seizing according to son for ~30 sec arms shaking bilaterally, post ictal period.  Next morning had second seizure in hospital.  Son present for both seizures.  Endorsed tonic clonic activity.  No more seizure activity since hospitalization.  No appt with neurology scheduled.  Saw Dr. [name] psych 08/14/2011.  Stopped abilify.  No memory problems prior to this.  Has noticed some difficulty with memory over last week, staying at home but feels time has been "foggy" and days running together.  Staying with tremor - present at rest.  Anxiety/depression stable.  Prior on valium (40mg  daily) for several years, quit cold [place] week before Thanksgiving.  Also on oxycontin (30mg  total daily) and stopped this cold [place] as well.  BP Readings from Last 3 Encounters:  08/28/11 138/100  08/21/11 125/73  07/17/11 136/98    Wt Readings from Last 3 Encounters:  08/28/11 182 lb 4 oz (82.668 kg)  08/19/11 118 lb 6.4 oz (53.706 kg)  07/17/11 185 lb 8 oz (84.142 kg)   Review of Systems Per HPI    Objective:   Physical Exam  Nursing note and vitals reviewed. Constitutional: She appears well-developed. No distress.  HENT:  Head: Normocephalic and atraumatic.  Right Ear: Hearing, tympanic membrane,  external ear and ear canal normal.  Left Ear: Hearing, tympanic membrane, external ear and ear canal normal.  Nose: No mucosal edema or rhinorrhea.  Mouth/Throat: Uvula is midline, oropharynx is clear and moist and mucous membranes are normal. No oropharyngeal exudate, posterior oropharyngeal edema, posterior oropharyngeal erythema or tonsillar abscesses.  Eyes: Conjunctivae and EOM are normal. Pupils are equal, round, and reactive to light. No scleral icterus. Right eye exhibits no nystagmus. Left eye exhibits no nystagmus.  Neck: Normal range of motion. Neck supple. No thyromegaly present.  Cardiovascular: Normal rate, regular rhythm, normal heart sounds and intact distal pulses.   No murmur heard. Pulmonary/Chest: Effort normal and breath sounds normal. No respiratory distress. She has no wheezes. She has no rales.  Abdominal: Soft. Bowel sounds are normal. She exhibits no distension. There is no tenderness. There is no rebound and no guarding.  Musculoskeletal: She exhibits no edema.  Lymphadenopathy:    She has no cervical adenopathy.  Neurological: She has normal strength. No cranial nerve deficit or sensory deficit. She exhibits normal muscle tone. She displays a negative Romberg sign. Coordination normal.  Reflex Scores:      Bicep reflexes are 2+ on the right side and 2+ on the left side.      Brachioradialis reflexes are 2+ on the right side and 2+ on the left side.      Patellar reflexes are 2+ on the right side and 2+ on the left side.      Achilles  reflexes are 2+ on the right side and 2+ on the left side.      Postural and resting tremor present  Skin: Skin is warm and dry. No rash noted.  Psychiatric: She has a normal mood and affect.       Assessment & Plan:

## 2011-08-28 NOTE — Patient Instructions (Signed)
Continue keppra for now I do want to have you see neurologist. We will call you with referral. Tdap today. Return in 2-3 months for follow up and prior fasting for blood work. Good to see you today, call us with questions.

## 2011-08-28 NOTE — Telephone Encounter (Signed)
Patient asked after her visit today if she was cleared to resume driving. She was told it would be up to you.

## 2011-08-28 NOTE — Assessment & Plan Note (Addendum)
?   Resting and postural. ?essential, consider beta blocker, anticipate more augmented physiologic tremor Present for last several months but worsened since discharge from hospital I wonder if due to psychotropic meds she's been on and recently taken off. Recently stopped abilify, stopped narcotics and benzo (valium) 06/2011. Will ask for neurology input.

## 2011-08-28 NOTE — Assessment & Plan Note (Signed)
Reviewed hospitalization records as well as MRI report - no known vascular disease, no h/o demyelinating illness.  New onset of 2 idiopathic? seizures this month.  EEG normal, MRI unrevealing, blood work as well as LP overall normal as well. I would appreciate neurology input on new seizures as well as persistent tremors, and abnormal MRI results. Will continue Keppra for now. RTC 1-2 mo for f/u.

## 2011-08-29 ENCOUNTER — Encounter: Payer: Self-pay | Admitting: Neurology

## 2011-08-29 MED ORDER — SIMVASTATIN 40 MG PO TABS: 40.0000 mg | ORAL_TABLET | Freq: Every day | ORAL | Status: DC

## 2011-08-29 MED ORDER — FERROUS SULFATE 325 (65 FE) MG PO TBEC: 325.0000 mg | DELAYED_RELEASE_TABLET | Freq: Two times a day (BID) | ORAL | Status: DC

## 2011-08-29 NOTE — Telephone Encounter (Signed)
Patient notified and will await to hear from [facility] in regards to neuro referral. Patient also requested refills on simvastatin and iron. Both sent in to [facility] as requested.

## 2011-08-29 NOTE — Telephone Encounter (Signed)
I want to wait for neurology evaluation prior to clearing.

## 2011-09-01 ENCOUNTER — Telehealth: Payer: Self-pay | Admitting: Family Medicine

## 2011-09-01 NOTE — Telephone Encounter (Signed)
Message left for patient to return my call. Advised we could work her in this afternoon or tomorrow morning if she wanted to be seen here instead of the ER. Advised per Dr. Reece Agar, it didn't sound cardiac and more than likely she could avoid the ER if she felt comfortable enough to do that. Will await return call.

## 2011-09-01 NOTE — Telephone Encounter (Signed)
Noted. How long has L chest pain been going in?   Sounds pleuritic, not cardiac.   I don't think she needs ER evaluation, could see here today 2:45pm or tomorrow 9am unless pt more comfortable going to ER.

## 2011-09-01 NOTE — Telephone Encounter (Signed)
Forwarded to Dr. Reece Agar for any further advice.

## 2011-09-01 NOTE — Telephone Encounter (Signed)
Spoke with patient. She said it started Friday and felt exactly like it did in the hospital when they told her is was pleuritic pain. She said she has been using the heating pad and taking her aleve as scheduled and things seem to be easing up. She did say that she got about 1/2 way to the hospital and it had eased up so much that she came back home. She really doesn't feel that she needs to be evaluated since things are easing up. Right now she said she really has to take a very deep breath for it to bother her. She did say that if things got worse, she would go to ER, but for now she would monitor things and call back if she decides that she wants to be seen here.

## 2011-09-01 NOTE — Telephone Encounter (Signed)
Triage Record Num: [id number] Operator: Valene Bors Patient Name: Jamie Patterson Call Date & Time: 09/01/2011 9:52:55AM Patient Phone: [phone] PCP: [name] Patient Gender: Female PCP Fax : [phone] Patient DOB: 1961-06-30 Practice Name: [facility] Reason for Call: Caller: Gwyndolyn/Patient; PCP: [name]; CB#: [phone]; Call regarding being seen in the office on 08/28/11 for f/u appnt after [facility] ER visit s/p seizure. She had fallen [place] with seizure and scratched face and hit head. Admitted for 2 dayssecond seizure in hospital. Kyerra started with "catch" on L side after leaving appnt- pain on L side of chest// under ribs// back hurts. Hurts if she coughs, yawns, she is breathing shallow, using heating pad which helps. Care advice per Breathing Problems Protocol and ER Advised and to f/u with office. Protocol(s) Used: Back Symptoms Protocol(s) Used: Breathing Problems Recommended Outcome per Protocol: See ED Immediately Reason for Outcome: Back pain associated with any breathing symptoms (such as noisy breathing, struggling to breathe, sudden change in respiratory rate) New onset shortness of breath AND recent surgery or trauma (within 4 wks.), prolonged immobilization (bedrest, long travel) OR taking medication with estrogen Care Advice: ~ Another adult should drive. ~ Do not give the patient anything to eat or drink. If home oxygen has been prescribed, apply it at the recommended flow rate; DO NOT increase the flow rate before consulting provider. ~ Call EMS 911 if loss of consciousness, struggling to breathe, experiences new confusion or extreme drowsiness, change in skin color, or has chest pain or discomfort lasting 5 minutes or more. ~ ~ IMMEDIATE ACTION Write down provider's name. List or place the following in a bag for transport with the patient: current prescription and/or nonprescription medications; alternative  treatments, therapies and medications; and street drugs. ~ ~ Place person in a position of comfort and loosen tight clothing. 09/01/2011 10:32:28AM Page 1 of 1 [id number]

## 2011-09-01 NOTE — Telephone Encounter (Signed)
Noted. Thanks. Portable CXR at hospital with minimal bronchitic changes.

## 2011-09-17 ENCOUNTER — Telehealth: Payer: Self-pay | Admitting: Family Medicine

## 2011-09-17 LAB — FUNGUS CULTURE W SMEAR

## 2011-09-17 MED ORDER — LEVETIRACETAM 500 MG PO TABS: 500.0000 mg | ORAL_TABLET | Freq: Two times a day (BID) | ORAL | Status: DC

## 2011-09-17 NOTE — Telephone Encounter (Signed)
Please notify pt sent in.

## 2011-09-17 NOTE — Telephone Encounter (Signed)
Please see previous note.

## 2011-09-17 NOTE — Telephone Encounter (Signed)
Saw Dr. Sharen Hones on Jan. 17th. Patient had a seizure on Jan. 8 and was told to go to PCP and she can not be released to drive until Neurologist will release her to drive.  She was put on medication while in hospital by a doctor until Neurologist appt and needs a prescription called into pharmacy at [facility]. Generic is Lezetiracetam.  Patient has no refills and is requesting refills.  Please call patient to confirm and assist with refills.

## 2011-09-18 NOTE — Telephone Encounter (Signed)
Patient notified by Asher Muir that Rx had been sent in.

## 2011-10-06 ENCOUNTER — Ambulatory Visit (INDEPENDENT_AMBULATORY_CARE_PROVIDER_SITE_OTHER): Payer: Medicare Other | Admitting: Neurology

## 2011-10-06 ENCOUNTER — Encounter: Payer: Self-pay | Admitting: Neurology

## 2011-10-06 VITALS — BP 150/100 | HR 80 | Wt 179.8 lb

## 2011-10-06 DIAGNOSIS — R569 Unspecified convulsions: Secondary | ICD-10-CM

## 2011-10-06 MED ORDER — LAMOTRIGINE 100 MG PO TABS: ORAL_TABLET | ORAL | Status: DC

## 2011-10-06 NOTE — Patient Instructions (Addendum)
Your sleep deprived EEG is scheduled for Wednesday, March 6th at 8:00am.  Please arrive to first floor admitting at [facility] by 7:45am.  Do not sleep after midnight the night before.  [phone].  Add 100mg  Lamictal tablets to current dose.  If you take Lamictal in the am, then add doses in the evening, or vice versa.  For 2 weeks add 0.5 tabs  daily, then for 2 weeks add 1 tab  daily, then from then on add 1.5 tabs daily, so that you are taking 150mg  twice a day.

## 2011-10-06 NOTE — Progress Notes (Signed)
Dear Dr. Sharen Hones,  Thank you for having me see Jamie Patterson in consultation today at [facility] for her problem with seizures as well as tremor.  As you may recall, she is a 51 y.o. year old female with a history of severe anxiety, depression, anemia and hyperlipidemia who presents with 2 events of loss of consciousness that are described as being preceding by a vocalization, falling and diffuse shaking.  There was no bladder loss, but may have bitten her tongue.  The first occurred at home and was witness by her son.  The second occurred in hospital, where she did not fall as she was in bed.  There was no aura.  In hospital, she had a MRI brain which revealed significant periventricular white matter abnormalities, as well as unilateral left sided ventriculomegaly.  Given the suspicion of MS she had a lumbar puncture done. It was unremarkable, including oligoclonal bands, and Lyme titers.  She was discharged on Keppra 500 bid in addition to the Lamictal 150 bid she takes for her depression.  She denies changes of medication before the event.  She did withdrawal from oxycodone and valium in November.  Since that time she has had worsening tremor.  Her anxiety has been worse as well.  She feels like she is imbalanced as well.  Past Medical History  Diagnosis Date  . HLD (hyperlipidemia)   . Depression     followed by psych  . Chronic pain due to injury     at R ankle due to ankle fracture  . Imbalance     likely multifactorial (s/p balance training at [facility] 08/2010)  . Right ankle pain 2003    trimalleolar fx s/p fusion  . Hypertension   . Iron deficiency anemia   - denies previous seizures  Past Surgical History  Procedure Date  . Ankle fusion 02/2004    Right-50% disability   . Orif ankle fracture 06/2003    Right  . Cesarean section   . Hospitalization 08/2011    syncope thought 2/2 seizure, MRI - Hyperintensity in the cerebral white matter bilaterally is nonspecific, started on  Keppra    History   Social History  . Marital Status: Divorced    Spouse Name: N/A    Number of Children: 1  . Years of Education: N/A   Occupational History  . Disabled    Social History Main Topics  . Smoking status: Former Smoker -- 1.0 packs/day for 20 years    Quit date: 08/12/1995  . Smokeless tobacco: Never Used  . Alcohol Use: Yes     Rare-social  . Drug Use: No  . Sexually Active: None   Other Topics Concern  . None   Social History Narrative   No caffeine. Divorced. 1 child. 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, although endorses that she falls a lot     Family History  Problem Relation Age of Onset  . Atrial fibrillation Father   . Hypertension Father   . Osteoarthritis Father   . Aortic aneurysm Father   . Depression Mother     V-tach; arrhythmia  . Hepatitis Brother     Hep. C  . Anemia Sister   . Arrhythmia Mother     Dx with Kirby Funk after having sudden collapse, did NOT have h/o AMI's    - no family history of seizures, mother had high arches.  Current Outpatient Prescriptions on File Prior  to Visit  Medication Sig Dispense Refill  . aspirin EC 81 MG tablet Take 81 mg by mouth every evening.        Marland Kitchen buPROPion (WELLBUTRIN XL) 150 MG 24 hr tablet Take 450 mg by mouth every morning.        . calcium-vitamin D (OSCAL WITH D) 500-200 MG-UNIT per tablet Take 1 tablet by mouth every evening.        . ferrous sulfate 325 (65 FE) MG EC tablet Take 1 tablet (325 mg total) by mouth 2 (two) times daily.  60 tablet  6  . FLUoxetine (PROZAC) 40 MG capsule Take 80 mg by mouth every morning.        . lamoTRIgine (LAMICTAL) 150 MG tablet Take 150 mg by mouth every morning.       . levETIRAcetam (KEPPRA) 500 MG tablet Take 1 tablet (500 mg total) by mouth 2 (two) times daily.  60 tablet  6  . naproxen sodium (ANAPROX) 220 MG tablet Take 440 mg by mouth 2 (two) times daily with a meal.        . OVER  THE COUNTER MEDICATION Take 1 capsule by mouth daily. instaflex joint support       . simvastatin (ZOCOR) 40 MG tablet Take 1 tablet (40 mg total) by mouth at bedtime.  30 tablet  3  . vitamin B-12 (CYANOCOBALAMIN) 1000 MCG tablet Take 1,000 mcg by mouth daily.        Marland Kitchen acetaminophen (TYLENOL) 500 MG tablet Take 500 mg by mouth as needed.         No Known Allergies    ROS:  13 systems were reviewed and are notable for numbness in feet, difficulty with gait, has frequent headaches but these are not disabling, has double vision at times(had strabismus as a child with amblyopia).  All other review of systems are unremarkable.   Examination:  Filed Vitals:   10/06/11 1321  BP: 150/100  Pulse: 80  Weight: 179 lb 12.8 oz (81.557 kg)     In general, very anxious appearing women.  Cardiovascular: The patient has a regular rate and rhythm and no carotid bruits.  Fundoscopy:  Disks are flat. Vessel caliber within normal limits.  Mental status:   The patient is oriented to person, place and time. Recent and remote memory are intact. Attention span and concentration are normal. Language including repetition, naming, following commands are intact. Fund of knowledge of current and historical events, as well as vocabulary are normal.  Cranial Nerves: Pupils are equally round and reactive to light. Visual fields full to confrontation. Extraocular movements are intact without nystagmus. Facial sensation and muscles of mastication are intact. Muscles of facial expression are symmetric. Hearing intact to bilateral finger rub. Tongue protrusion, uvula, palate midline.  Shoulder shrug intact  Motor:  The patient has normal bulk and tone, no pronator drift.  Moderate to severe high frequency postural tremor.  Patient has hammer toes, very high arches.  5/5 muscle strength bilaterally.  Reflexes:  3= bilaterally, absent right ankle(ankle fusion), 1+ left ankle.  Toes down  Coordination:  Normal  finger to nose.  No dysdiadokinesia.  Sensation is length dependent sensory loss of temp and vibration.  Gait and Station are wide based.  Romberg is positive.  MRI brain was reviewed, significant WMD near corpus collosum, left sided ventriculomegaly, brain atrophy.  Impression:   1.  Seizures. 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  The MRI has the character of perinatal insult.  I would like to get a sleep deprived EEG and also increase her Lamictal to 150 bid with the goal of eliminating the Keppra at her next visit. 2.  Tremor.  Benign essential tremor, like exacerbated by anxiety.  Clearly the Valium was treating this before.  I have asked her to talk to her psychiatrist about other treatments for her anxiety.  Propranolol is not a good choice for her tremor given it can worsen depression.  I would rather not use primidone either at this juncture.  We may consider Topamax in the future. 3.  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  Will just monitor at this point.   Thank you for having [place] see Ellorie Peary in consultation.  Feel free to contact me with any questions.  [name], MD [facility], [place] [address] [place], [place] [address] Phone: [phone] Fax: [phone].

## 2011-10-07 ENCOUNTER — Encounter: Payer: Self-pay | Admitting: Family Medicine

## 2011-10-07 ENCOUNTER — Telehealth: Payer: Self-pay

## 2011-10-07 NOTE — Telephone Encounter (Signed)
Pt.notified

## 2011-10-07 NOTE — Telephone Encounter (Signed)
I knew she was taking 150mg  a day.  I wrote out how to increase it on her AVS.  If she is taking the 150 at night, then she should add the new meds in the a.m. and vice versa.    I prescribed 100mg  LTG tabs.  If she is taking 150mg  at night then she should add 50(1/2 tablet) in the a.m. for 2 weeks, then 100mg  in the a.m. for two weeks, then 150mg  in the a.m. from then on so should when then be at 150 bid.

## 2011-10-07 NOTE — Telephone Encounter (Signed)
Pt called stating she is actually taking lamictal 150mg  daily, not the 100mg  that she thought.  So she would like to know how you want her to increase it to the 150mg  bid.

## 2011-10-15 ENCOUNTER — Ambulatory Visit (HOSPITAL_COMMUNITY)
Admission: RE | Admit: 2011-10-15 | Discharge: 2011-10-15 | Disposition: A | Discharge status: Home / Self Care | Payer: Medicare Other | Source: Ambulatory Visit | Attending: Neurology | Admitting: Neurology

## 2011-10-15 DIAGNOSIS — G40909 Epilepsy, unspecified, not intractable, without status epilepticus: Secondary | ICD-10-CM

## 2011-10-15 DIAGNOSIS — R569 Unspecified convulsions: Secondary | ICD-10-CM

## 2011-10-15 DIAGNOSIS — Z0189 Encounter for other specified special examinations: Secondary | ICD-10-CM | POA: Insufficient documentation

## 2011-10-15 NOTE — Procedures (Signed)
EEG NUMBER:  [id number].  This sleep-deprived EEG was requested in this 51 year old with a history of possible seizures.  Medications include lamotrigine and levetiracetam as well as Prozac and bupropion.  This EEG was done with the patient awake.  During periods of maximal wakefulness, she had an 11 cycles per second posterior dominant rhythm that attenuated with eye opening and was symmetric.  Background activities were composed of low amplitude.  Alpha and beta activities that were symmetric.  There was noted to be increased amplitude of beta activities  over the vertex.  Photic stimulation produced a symmetric driving response. Hyperventilation for 3 minutes with good effort did not markedly change the tracing.  Patient did not fall asleep.  CLINICAL INTERPRETATION:  This sleep-deprived EEG done with the patient awake is essentially normal.  Increased beta activities that occurred at the vertex are of uncertain significance.  They may represent a technical artifact.  It is also possibly that represent a focal cortical abnormality.          ______________________________ Denton Meek, MD    NF:AOZH D:  10/15/2011 17:35:09  T:  10/15/2011 17:52:48  Job #:  [id number]

## 2011-10-15 NOTE — Progress Notes (Signed)
Sleep Deprived EEG w/ video completed as outpatient.

## 2011-12-31 ENCOUNTER — Other Ambulatory Visit: Payer: Self-pay | Admitting: *Deleted

## 2011-12-31 MED ORDER — SIMVASTATIN 40 MG PO TABS: 40.0000 mg | ORAL_TABLET | Freq: Every day | ORAL | Status: DC

## 2012-01-06 ENCOUNTER — Ambulatory Visit (INDEPENDENT_AMBULATORY_CARE_PROVIDER_SITE_OTHER): Payer: Medicare Other | Admitting: Neurology

## 2012-01-06 ENCOUNTER — Encounter: Payer: Self-pay | Admitting: Neurology

## 2012-01-06 ENCOUNTER — Other Ambulatory Visit: Payer: Self-pay | Admitting: Neurology

## 2012-01-06 ENCOUNTER — Other Ambulatory Visit (INDEPENDENT_AMBULATORY_CARE_PROVIDER_SITE_OTHER): Payer: Medicare Other

## 2012-01-06 VITALS — BP 120/78 | HR 88 | Wt 178.0 lb

## 2012-01-06 DIAGNOSIS — R7309 Other abnormal glucose: Secondary | ICD-10-CM

## 2012-01-06 DIAGNOSIS — G609 Hereditary and idiopathic neuropathy, unspecified: Secondary | ICD-10-CM

## 2012-01-06 DIAGNOSIS — R569 Unspecified convulsions: Secondary | ICD-10-CM

## 2012-01-06 LAB — CBC WITH DIFFERENTIAL/PLATELET
Basophils Absolute: 0.1 10*3/uL (ref 0.0–0.1)
HCT: 39 % (ref 36.0–46.0)
Lymphs Abs: 1.6 10*3/uL (ref 0.7–4.0)
Monocytes Relative: 7 % (ref 3.0–12.0)
Neutrophils Relative %: 63.2 % (ref 43.0–77.0)
Platelets: 213 10*3/uL (ref 150.0–400.0)
RDW: 12.6 % (ref 11.5–14.6)

## 2012-01-06 LAB — COMPREHENSIVE METABOLIC PANEL
ALT: 23 U/L (ref 0–35)
Alkaline Phosphatase: 66 U/L (ref 39–117)
CO2: 27 mEq/L (ref 19–32)
GFR: 106.29 mL/min (ref >60.00)
Potassium: 3.8 mEq/L (ref 3.5–5.1)
Sodium: 140 mEq/L (ref 135–145)
Total Bilirubin: 0.6 mg/dL (ref 0.3–1.2)
Total Protein: 6.7 g/dL (ref 6.0–8.3)

## 2012-01-06 LAB — SEDIMENTATION RATE: Sed Rate: 15 mm/hr (ref 0–22)

## 2012-01-06 LAB — TSH: TSH: 2.69 u[IU]/mL (ref 0.35–5.50)

## 2012-01-06 NOTE — Progress Notes (Signed)
- no new spells - increased lamictal - still having tremor - still having numbness in feet - pain in neck with headaches  - decrease keppra - emg/ncs - pn labs - ?topamax or propropranolol -   Dear Dr. Sharen Hones,   I saw  Jamie Patterson in follow up today at [facility] for her problem with seizures as well as tremor. As you may recall, she is a 51 y.o. year old female with a history of severe anxiety, depression, anemia and hyperlipidemia who presents with 2 events of loss of consciousness that are described as being preceding by a vocalization, falling and diffuse shaking. There was no bladder loss, but may have bitten her tongue. The first occurred at home and was witness by her son. The second occurred in hospital, where she did not fall as she was in bed. There was no aura.   In hospital, she had a MRI brain which revealed significant periventricular white matter abnormalities, as well as unilateral left sided ventriculomegaly. Given the suspicion of MS she had a lumbar puncture done. It was unremarkable, including oligoclonal bands, and Lyme titers. She was discharged on Keppra 500 bid in addition to the Lamictal 150 bid she takes for her depression.  She denies changes of medication before the event. She did withdrawal from oxycodone and valium in November. Since that time she has had worsening tremor. Her anxiety has been worse as well. She feels like she is imbalanced as well.  ----------------  When I first saw her I decided to increase her Lamictal to 150 bid. A sleep deprived EEG was normal.  She has had no further events of shaking with loss of consciousness.  She does complain that her tremor has gotten worse since being off the Valium which she stopped in November.  She also complains of numbness in the feet, and neck pain leading to headaches.  Her numbness in her feet also causes some unsteadiness of gait.  At her last visit I noted hammer toes and high arches and questioned  whether she might had a hereditary neuropathy.  She continues to see Dr. [name] for her anxiety and depression.  Medical history, social history, and family history were reviewed and have not changed since the last clinic visit.  Current Outpatient Prescriptions on File Prior to Visit  Medication Sig Dispense Refill  . acetaminophen (TYLENOL) 500 MG tablet Take 500 mg by mouth as needed.       Marland Kitchen aspirin EC 81 MG tablet Take 81 mg by mouth every evening.        Marland Kitchen buPROPion (WELLBUTRIN XL) 150 MG 24 hr tablet Take 450 mg by mouth every morning.        . calcium-vitamin D (OSCAL WITH D) 500-200 MG-UNIT per tablet Take 1 tablet by mouth every evening.        . ferrous sulfate 325 (65 FE) MG EC tablet Take 1 tablet (325 mg total) by mouth 2 (two) times daily.  60 tablet  6  . FLUoxetine (PROZAC) 40 MG capsule Take 80 mg by mouth every morning.        . lamoTRIgine (LAMICTAL) 100 MG tablet increase to 1.5 tabs once per day, in addition to 150mg  tab you take already.  45 tablet  5  . lamoTRIgine (LAMICTAL) 150 MG tablet Take 150 mg by mouth every morning.       . levETIRAcetam (KEPPRA) 500 MG tablet Take 1 tablet (500 mg total) by mouth 2 (two) times  daily.  60 tablet  6  . Melatonin 3 MG TABS Take 3 mg by mouth 3 times/day as needed-between meals & bedtime.      . naproxen sodium (ANAPROX) 220 MG tablet Take 440 mg by mouth 2 (two) times daily with a meal.        . OVER THE COUNTER MEDICATION Take 1 capsule by mouth daily. instaflex joint support      . simvastatin (ZOCOR) 40 MG tablet Take 1 tablet (40 mg total) by mouth at bedtime.  30 tablet  11  . vitamin B-12 (CYANOCOBALAMIN) 1000 MCG tablet Take 1,000 mcg by mouth daily.          No Known Allergies  ROS:  13 systems were reviewed and are unremarkable.  Exam: . Filed Vitals:   01/06/12 1431  BP: 120/78  Pulse: 88  Weight: 178 lb (80.74 kg)    In general, very anxious appearing women.  Cardiovascular:  The patient has a regular  rate and rhythm and no carotid bruits.  Fundoscopy:  Disks are flat. Vessel caliber within normal limits.  Mental status:  The patient is oriented to person, place and time. Recent and remote memory are intact. Attention span and concentration are normal. Language including repetition, naming, following commands are intact.  Fund of knowledge of current and historical events, as well as vocabulary are normal.  Cranial Nerves:  Pupils are equally round and reactive to light. Visual fields full to confrontation. Extraocular movements are intact without nystagmus. Facial sensation and muscles of mastication are intact. Muscles of facial expression are symmetric. Hearing intact to bilateral finger rub. Tongue protrusion, uvula, palate midline. Shoulder shrug intact  Motor: The patient has normal bulk and tone, no pronator drift. Moderate to severe high frequency postural tremor. Patient has hammer toes, very high arches.  5/5 muscle strength bilaterally.  Reflexes:  3= bilaterally, absent right ankle(ankle fusion), 1+ left ankle.  Toes down  Coordination: Normal finger to nose. No dysdiadokinesia.  Sensation is length dependent sensory loss of temp and vibration.  Gait and Station are wide based. Romberg is positive.   Impression/Recommendations:  1.  Spells - I am uncertain whether she did have seizures.  In any case I am going to try to wean her off the Keppra slowly, as I have increased her LTG to hopefully decreased her likelihood of a recurrent seizure.  She is not to drive until 6 months after her last event - this should be in July. 2.  Tremor - I would rather not start another medication at this time given her psychiatric comorbidities.  However, if Dr. [name] thinks it is appropriate Topamax or propranolol both can be useful for tremor and have a low likelihood of being abused. 3.  Numbness in feet - likely peripheral neuropathy.  I will do PN labs and get a NCS/EMG as well. ?CMT.  We will  see the patient back in 2 months.  [name], MD [facility], [employer]

## 2012-01-06 NOTE — Patient Instructions (Addendum)
decrease Keppra to 1/2 tab twice a day for 1 month then stop.   Go to the basement to have your labs drawn today.   Your NCV/EMG is scheduled at [facility] located at [address] in [place] on Monday, February 09, 2012 at 8:30.  Please arrive 15 minutes prior to your scheduled appointment.   [phone]

## 2012-01-08 LAB — SPEP & IFE WITH QIG
Beta 2: 4.5 % (ref 3.2–6.5)
Beta Globulin: 5.5 % (ref 4.7–7.2)
Gamma Globulin: 8.1 % — ABNORMAL LOW (ref 11.1–18.8)
IgA: 107 mg/dL (ref 69–380)
IgG (Immunoglobin G), Serum: 474 mg/dL — ABNORMAL LOW (ref 690–1700)

## 2012-01-09 NOTE — Progress Notes (Signed)
Normal results letter sent. 

## 2012-03-12 ENCOUNTER — Ambulatory Visit: Payer: Medicare Other | Admitting: Neurology

## 2012-03-22 ENCOUNTER — Encounter: Payer: Self-pay | Admitting: Neurology

## 2012-03-22 ENCOUNTER — Ambulatory Visit (INDEPENDENT_AMBULATORY_CARE_PROVIDER_SITE_OTHER): Payer: Medicare Other | Admitting: Neurology

## 2012-03-22 VITALS — BP 142/90 | HR 112 | Wt 173.0 lb

## 2012-03-22 DIAGNOSIS — R259 Unspecified abnormal involuntary movements: Secondary | ICD-10-CM

## 2012-03-22 DIAGNOSIS — R251 Tremor, unspecified: Secondary | ICD-10-CM

## 2012-03-22 MED ORDER — LAMOTRIGINE 150 MG PO TABS: 150.0000 mg | ORAL_TABLET | Freq: Two times a day (BID) | ORAL | Status: DC

## 2012-03-22 NOTE — Progress Notes (Signed)
Dear Dr. Sharen Hones,   I saw Jamie Patterson in follow up today at [facility] for her problem with seizures as well as tremor. As you may recall, she is a 51 y.o. year old female with a history of severe anxiety, depression, anemia and hyperlipidemia who presents with 2 events of loss of consciousness that are described as being preceding by a vocalization, falling and diffuse shaking. There was no bladder loss, but may have bitten her tongue. The first occurred at home and was witness by her son. The second occurred in hospital, where she did not fall as she was in bed. There was no aura.  In hospital, she had a MRI brain which revealed significant periventricular white matter abnormalities, as well as unilateral left sided ventriculomegaly. Given the suspicion of MS she had a lumbar puncture done. It was unremarkable, including oligoclonal bands, and Lyme titers. She was discharged on Keppra 500 bid in addition to the Lamictal 100 bid she takes for her depression.   She denies changes of medication before the event. She did withdrawal from oxycodone and valium in November. Since that time she has had worsening tremor. Her anxiety has been worse as well. She feels like she is imbalanced as well. ------------------------------- At a previous visit I had increased her Lamictal to 150 bid.  At her last visit we weaned her off the Keppra.  She is back to driving after 6 months of being seizure free.  She has had no other spells.  Notably a sleep deprived EEG was normal.  Also, we got a NCS for her hammer toes and numbness in her feet.  It did not show any signs of a PN.  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  Her biggest complaint is tremor.  Due to a mixup in communications with her psychiatrist, Dr. [name] a medication for tremor was never started.  I would like to try propranolol or Topamax.  I have a call in to Dr. [name] to discuss a medication choice.    Medical history, social history, and family  history were reviewed and have not changed since the last clinic visit.  Current Outpatient Prescriptions on File Prior to Visit  Medication Sig Dispense Refill  . acetaminophen (TYLENOL) 500 MG tablet Take 500 mg by mouth as needed.       Marland Kitchen aspirin EC 81 MG tablet Take 81 mg by mouth every evening.        Marland Kitchen buPROPion (WELLBUTRIN XL) 150 MG 24 hr tablet Take 450 mg by mouth every morning.        . calcium-vitamin D (OSCAL WITH D) 500-200 MG-UNIT per tablet Take 1 tablet by mouth every evening.        . ferrous sulfate 325 (65 FE) MG EC tablet Take 1 tablet (325 mg total) by mouth 2 (two) times daily.  60 tablet  6  . FLUoxetine (PROZAC) 40 MG capsule Take 80 mg by mouth every morning.        . Melatonin 3 MG TABS Take 3 mg by mouth 3 times/day as needed-between meals & bedtime.      . naproxen sodium (ANAPROX) 220 MG tablet Take 440 mg by mouth 2 (two) times daily with a meal.        . simvastatin (ZOCOR) 40 MG tablet Take 1 tablet (40 mg total) by mouth at bedtime.  30 tablet  11  . vitamin B-12 (CYANOCOBALAMIN) 1000 MCG tablet Take 1,000 mcg by mouth daily.        Marland Kitchen  DISCONTD: lamoTRIgine (LAMICTAL) 100 MG tablet increase to 1.5 tabs once per day, in addition to 150mg  tab you take already.  45 tablet  5  . DISCONTD: lamoTRIgine (LAMICTAL) 150 MG tablet Take 150 mg by mouth every morning.         No Known Allergies  ROS:  13 systems were reviewed and are notable for significant anxiety and depression.  All other review of systems are unremarkable.  Exam: . Filed Vitals:   03/22/12 1125  BP: 142/90  Pulse: 112  Weight: 173 lb (78.472 kg)    In general, angry appearing women.  Mental status:   The patient is oriented to person, place and time. Recent and remote memory are intact. Attention span and concentration are normal. Language including repetition, naming, following commands are intact. Fund of knowledge of current and historical events, as well as vocabulary are  normal.  Cranial Nerves: Pupils are equally round and reactive to light.  Extraocular movements are intact without nystagmus. . Muscles of facial expression are symmetric. Tongue protrusion, uvula, palate midline.  Shoulder shrug intact  Motor:  Normal bulk and tone, no drift and 5/5 muscle strength bilaterally.  Significant postural tremor, high frequency.  Reflexes:  3+ throughout, except absent the ankles.  Coordination:  Terminal tremor, mild action component.  Sensation:  About 3 s of vibration at the toes, temp normalizes just above ankle.  Position normal.  Gait:  Normal gait and station.  Mildly imbalanced romberg.  Impression/Recommendations:  1.  Spells of LOC - unknown etiology, may have been seizure, however, she can continue on LTG 150mg  bid. 2.  Essential Tremor - worsened by anxiety.  In the past, she had some problems with benzodiazepines, so I would suggest the use of propranolol or Topamax.  I am going to speak to Dr. [name] about the use of propranolol. 3.  ?Peripheral neuropathy - interesting that her NCS were normal.  In any case, appears to be just a mild irritant.  I am not keen about starting another medication for this given her polypharmacy already.  She is going to follow up with my colleague Dr. Arbutus Leas with respect to her essential tremor.  [name], MD [facility], [place]   ADDENDUM:  Spoke to psychiatrist Dr. [name].  She is ok with starting propranolol.  We will increase to 40 bid.  Patient informed.

## 2012-03-24 ENCOUNTER — Other Ambulatory Visit: Payer: Self-pay | Admitting: Neurology

## 2012-03-24 ENCOUNTER — Telehealth: Payer: Self-pay | Admitting: Neurology

## 2012-03-24 MED ORDER — PROPRANOLOL HCL 20 MG PO TABS: ORAL_TABLET | ORAL | Status: DC

## 2012-03-24 NOTE — Telephone Encounter (Signed)
Spoke with Jamie Patterson. Information provided as per Dr. [name] below. Will e-scribe to [facility]. Patient understands to watch for dizziness.

## 2012-03-24 NOTE — Telephone Encounter (Signed)
Pt went to [facility] to pick up RX and was told they never received it. Can we call pharmacy and see what the problem is or resend the prescription?

## 2012-03-24 NOTE — Telephone Encounter (Signed)
Message copied by Benay Spice on Wed Mar 24, 2012 10:55 AM ------      Message from: Milas Gain      Created: Tue Mar 23, 2012  3:13 PM       Let Ms. Rodwell know I spoke to Dr. [name].  She was ok with either of the medications I had in mind for her tremor.             I would try propranolol 20mg  tabs, dispense 120 tabs, 3 refills            take 1 tab in the a.m. for 2 weeks,      then 1 tab in the am and at dinner for 2 weeks      then 2 tabs in the a.m., 1 tab at dinner for 2 weeks      then 2 tabs in the a.m., 2 at dinner from then on.            She should watch for significant dizziness that last multiple days.            Matt

## 2012-03-24 NOTE — Telephone Encounter (Signed)
Called patient's pharmacy and gave script verbally.

## 2012-03-29 ENCOUNTER — Encounter: Payer: Self-pay | Admitting: Neurology

## 2012-05-03 ENCOUNTER — Telehealth: Payer: Self-pay | Admitting: Neurology

## 2012-05-03 NOTE — Telephone Encounter (Signed)
Pt would like Inderal refilled at [facility] in [place], [phone]. Medication was prescribed by Dr. [name]... Can we refill it?

## 2012-05-03 NOTE — Telephone Encounter (Signed)
Spoke with the [facility] pharmacist as well as the patient. There are 3 refills on the Inderal. No other issues/concerns voiced at this time. The patient will follow up with Dr. Arbutus Leas for her tremor.

## 2012-05-18 ENCOUNTER — Ambulatory Visit (INDEPENDENT_AMBULATORY_CARE_PROVIDER_SITE_OTHER): Payer: Medicare Other | Admitting: Neurology

## 2012-05-18 ENCOUNTER — Encounter: Payer: Self-pay | Admitting: Neurology

## 2012-05-18 VITALS — BP 130/70 | HR 80 | Temp 98.2°F | Resp 16 | Wt 184.0 lb

## 2012-05-18 DIAGNOSIS — R259 Unspecified abnormal involuntary movements: Secondary | ICD-10-CM

## 2012-05-18 DIAGNOSIS — G40909 Epilepsy, unspecified, not intractable, without status epilepticus: Secondary | ICD-10-CM

## 2012-05-18 DIAGNOSIS — R251 Tremor, unspecified: Secondary | ICD-10-CM

## 2012-05-18 NOTE — Progress Notes (Signed)
Jamie Patterson was seen today in neurologic consultation at the request of [name], MD and Dr. [name].  Since Dr. [name] is no longer with the practice, I will be resuming the patient's care.  I have reviewed the patient's prior medical records.    The patient has had 2 episodes of what sound like seizure.  These consisted of a vocalization, falling and generalized shaking.  An MRI of the brain was done and revealed multiple T2 hyperintensities, with some concern for demyelinating disease.  She subsequently had a lumbar puncture that was negative for oligoclonal bands.  She was placed on Keppra in addition to the Lamictal that she was already on for depression.  She subsequently had a sleep deprived EEG that was negative and therefore her Keppra was weaned and her Lamictal was increased, in hopes that that could be used as monotherapy for potential seizure and depression.  The patient also has tremor and after talking with Dr. [name], the patients psychiatrist, Dr. [name] started the patient on propranolol 20 mg, 2 tablets times a day.  The patient reports that it has helped dramatically, at least 75% helpful.  She reports that tremor began after a w/d from oxycontin and valium (she went off of it "cold [place]" and did not realize that she was "addicted" and should wean it).  She may have had tremor before then but worse after that.  She could feel the tremor inside and that is much better. She is no longer having trouble with activities of daily living like she was. She has noted HA and wondered if that is related to propranolol.  She has had some dizziness, most prominent if she bends over   PREVIOUS MEDICATIONS: n/a  ALLERGIES:  No Known Allergies  CURRENT MEDICATIONS:  Current Outpatient Prescriptions on File Prior to Visit  Medication Sig Dispense Refill  . acetaminophen (TYLENOL) 500 MG tablet Take 500 mg by mouth as needed.       Marland Kitchen aspirin EC 81 MG tablet Take 81 mg by mouth every evening.        Marland Kitchen  buPROPion (WELLBUTRIN XL) 150 MG 24 hr tablet Take 450 mg by mouth every morning.        . calcium-vitamin D (OSCAL WITH D) 500-200 MG-UNIT per tablet Take 1 tablet by mouth every evening.        . ferrous sulfate 325 (65 FE) MG EC tablet Take 1 tablet (325 mg total) by mouth 2 (two) times daily.  60 tablet  6  . FLUoxetine (PROZAC) 40 MG capsule Take 80 mg by mouth every morning.        . lamoTRIgine (LAMICTAL) 150 MG tablet Take 1 tablet (150 mg total) by mouth 2 (two) times daily.  60 tablet  11  . Melatonin 3 MG TABS Take 3 mg by mouth 3 times/day as needed-between meals & bedtime.      . naproxen sodium (ANAPROX) 220 MG tablet Take 440 mg by mouth 2 (two) times daily with a meal.        . propranolol (INDERAL) 20 MG tablet Take as directed: Take 1 q am x 2 wks;  then take 1 q am and 1 q 6pm x 2 weeks;  take 2 q am and 1 q 6 pm x 2 wks;  take 2 po q and q 6pm thereafter  120 tablet  3  . simvastatin (ZOCOR) 40 MG tablet Take 1 tablet (40 mg total) by mouth at bedtime.  30  tablet  11  . vitamin B-12 (CYANOCOBALAMIN) 1000 MCG tablet Take 1,000 mcg by mouth daily.          PAST MEDICAL HISTORY:   Past Medical History  Diagnosis Date  . HLD (hyperlipidemia)   . Depression     followed by psych  . Chronic pain due to injury     at R ankle due to ankle fracture  . Imbalance     likely multifactorial (s/p balance training at [facility] 08/2010) ?CMT  . Right ankle pain 2003    trimalleolar fx s/p fusion  . Hypertension   . Iron deficiency anemia   . Seizure     focal, ex vacuo ventriculomegaly?, pending neuro w/u with sleep deprived EEG  . Benign essential tremor     worsened by anxiety    PAST SURGICAL HISTORY:   Past Surgical History  Procedure Date  . Ankle fusion 02/2004    Right-50% disability   . Orif ankle fracture 06/2003    Right  . Cesarean section   . Hospitalization 08/2011    syncope thought 2/2 seizure, MRI - Hyperintensity in the cerebral white matter bilaterally is  nonspecific, started on Keppra    SOCIAL HISTORY:   History   Social History  . Marital Status: Divorced    Spouse Name: N/A    Number of Children: 1  . Years of Education: N/A   Occupational History  . Disabled     depression  . DISABILITY    Social History Main Topics  . Smoking status: Former Smoker -- 1.0 packs/day for 20 years    Quit date: 08/12/1995  . Smokeless tobacco: Never Used  . Alcohol Use: No     None for > 1 year  . Drug Use: No  . Sexually Active: Not on file   Other Topics Concern  . Not on file   Social History Narrative   No caffeine. Divorced. 1 child. 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, although endorses that she falls a lot     FAMILY HISTORY:   Family Status  Relation Status Death Age  . Mother Deceased 15    Died in her sleep. Had h/o Vtach   . Father Alive     a-fib  . Brother Alive     2, alive and well  . Sister Alive     alive and well  . Child Alive     alive and well    ROS:  A complete 10 system review of systems was obtained and was unremarkable apart from what is mentioned above.  PHYSICAL EXAMINATION:    VITALS:   Filed Vitals:   05/18/12 1235  BP: 130/70  Pulse: 80  Temp: 98.2 F (36.8 C)  Resp: 16  Weight: 184 lb (83.462 kg)    GEN:  Normal appears female in no acute distress.  Appears stated age.  Appears somewhat anxious. HEENT:  Normocephalic, atraumatic. The mucous membranes are moist. The superficial temporal arteries are without ropiness or tenderness. Cardiovascular: Regular rate and rhythm. Lungs: Clear to auscultation bilaterally. Neck/Heme: There are no carotid bruits noted bilaterally.  NEUROLOGICAL: Orientation:  The patient is alert and oriented x 3.  Fund of knowledge is appropriate.  Recent and remote memory intact.  Attention span and concentration normal.  Repeats and names without difficulty. Cranial nerves: There is good  facial symmetry. The pupils are equal round and reactive to  light bilaterally. Fundoscopic exam reveals clear disc margins bilaterally. Extraocular muscles are intact and visual fields are full to confrontational testing. Speech is fluent and clear. Soft palate rises symmetrically and there is no tongue deviation. Hearing is intact to conversational tone. Tone: Tone is good throughout. Sensation: Sensation is intact to light touch throughout (facial, trunk, extremities). Vibration is intact at the bilateral big toe. There is no extinction with double simultaneous stimulation. There is no sensory dermatomal level identified. Coordination:  The patient has no difficulty with RAM's or FNF bilaterally.   Abnormal Movements:  There is some tremor of the outstretched hands.  Archimedes spirals are drawn fairly well. Motor: Strength is 5/5 in the bilateral upper and lower extremities.  Shoulder shrug is equal and symmetric. There is no pronator drift.  There are no fasciculations noted. DTR's: Deep tendon reflexes are 3/4 at the bilateral biceps, triceps, brachioradialis, patella and achilles.  Plantar responses are downgoing bilaterally. Gait and Station: The patient is able to ambulate without difficulty.   Labs:  Lab Results  Component Value Date   VITAMINB12 777 01/06/2012      Chemistry      Component Value Date/Time   NA 140 01/06/2012 1531   K 3.8 01/06/2012 1531   CL 104 01/06/2012 1531   CO2 27 01/06/2012 1531   BUN 11 01/06/2012 1531   CREATININE 0.6 01/06/2012 1531      Component Value Date/Time   CALCIUM 9.6 01/06/2012 1531   ALKPHOS 66 01/06/2012 1531   AST 25 01/06/2012 1531   ALT 23 01/06/2012 1531   BILITOT 0.6 01/06/2012 1531     Lab Results  Component Value Date   WBC 5.8 01/06/2012   HGB 13.4 01/06/2012   HCT 39.0 01/06/2012   MCV 98.8 01/06/2012   PLT 213.0 01/06/2012   Lab Results  Component Value Date   TSH 2.69 01/06/2012      IMPRESSION:  1. Essential Tremor, likely  exacerbated by significant anxiety/depression. 2.  Possible seizure.  EEG has been normal, but clinically it sounds as if the patient had a seizure.  We are currently optimizing her antidepressant (Lamictal) 4 seizure control.  PLAN:  1. the propranolol is working well for tremor, but the dizziness may be a side effect from it.  Her vital signs were good today, including the pulse.  I gave her several options.  I told her that we could continue the medication as it is, change to a long-acting form or change to Mysoline.  Right now, she would like to continue with the same medication at the same dosage (propranolol 20 mg, 2 tablets twice a day).  She will let me know if dizziness increases or if she feels near syncopal. 2.  The patient seems to be doing well both from a depression standpoint and from a seizure standpoint with Lamictal.  She will remain on that medication.  Risks, benefits, side effects and alternative therapies were discussed.  The opportunity to ask questions was given and they were answered to the best of my ability.  The patient expressed understanding and willingness to follow the outlined treatment protocols.  I talked to her about Stevens-Johnson reaction and she will discontinue the medication immediately and let me know if she has any rash. 3.  I addressed the Wellbutrin with the patient, since it can lower seizure threshold.  She is going to address this further with her psychiatrist on Friday, but knowing all of this, she feels that the  benefits may outweigh the risks given the significant depression that she used to have.  I am not in disagreement, but if she should have another seizure, then I feel that we would need to discontinue this. 4.  Return in about 6 months (around 11/16/2012). 5. seizure and safety was discussed.  Face-to-face time in the room with the patient today was 35 minutes.

## 2012-08-12 ENCOUNTER — Other Ambulatory Visit: Payer: Self-pay

## 2012-08-12 MED ORDER — PROPRANOLOL HCL 20 MG PO TABS: 20.0000 mg | ORAL_TABLET | Freq: Two times a day (BID) | ORAL | Status: DC

## 2012-08-19 ENCOUNTER — Ambulatory Visit (INDEPENDENT_AMBULATORY_CARE_PROVIDER_SITE_OTHER): Payer: Medicare Other | Admitting: Family Medicine

## 2012-08-19 ENCOUNTER — Encounter: Payer: Self-pay | Admitting: Family Medicine

## 2012-08-19 VITALS — BP 142/100 | HR 68 | Temp 98.2°F | Wt 186.2 lb

## 2012-08-19 DIAGNOSIS — N390 Urinary tract infection, site not specified: Secondary | ICD-10-CM | POA: Insufficient documentation

## 2012-08-19 DIAGNOSIS — R350 Frequency of micturition: Secondary | ICD-10-CM

## 2012-08-19 DIAGNOSIS — Z23 Encounter for immunization: Secondary | ICD-10-CM

## 2012-08-19 LAB — POCT URINALYSIS DIPSTICK
Blood, UA: NEGATIVE
Protein, UA: UNDETERMINED

## 2012-08-19 MED ORDER — CIPROFLOXACIN HCL 500 MG PO TABS: 500.0000 mg | ORAL_TABLET | Freq: Two times a day (BID) | ORAL | Status: DC

## 2012-08-19 NOTE — Patient Instructions (Addendum)
Let's keep an eye on blood pressure at home - if consistently >140/90, let me know. For urinary infection - take cipro twice daily for 3 days.  Update [place] if symptoms not improved with this Push fluids and plenty of rest. Good to see you today, call us with questions. flu shot today

## 2012-08-19 NOTE — Progress Notes (Signed)
  Subjective:    Patient ID: Jamie Patterson, female    DOB: 28-Nov-1960, 52 y.o.   MRN: [id number]  HPI CC: UTI sxs  1 wk of dysuria, urgency, frequency, some nusea (attributes to propranolol).  No fevers/chills, nausea/vomiting, abd pain, back or flank pain.  No hematuria.    No recent urinary infection or abx use.  No recent seizures.  lamictal recently increased. Propranolol has helped tremor. Mood - about same.  Prior saw Dr. [name] neurologist, now sees Dr. Arbutus Leas.  Past Medical History  Diagnosis Date  . HLD (hyperlipidemia)   . Depression     followed by psych  . Chronic pain due to injury     at R ankle due to ankle fracture  . Imbalance     likely multifactorial (s/p balance training at [facility] 08/2010) ?CMT  . Right ankle pain 2003    trimalleolar fx s/p fusion  . Hypertension   . Iron deficiency anemia   . Seizure     focal, ex vacuo ventriculomegaly?, pending neuro w/u with sleep deprived EEG  . Benign essential tremor     worsened by anxiety  . Amblyopia      Review of Systems     Objective:   Physical Exam  Nursing note and vitals reviewed. Constitutional: She appears well-developed and well-nourished. No distress.  HENT:  Head: Normocephalic and atraumatic.  Mouth/Throat: Oropharynx is clear and moist. No oropharyngeal exudate.  Abdominal: Soft. Bowel sounds are normal. She exhibits no distension and no mass. There is no hepatosplenomegaly. There is no tenderness. There is no rebound, no guarding and no CVA tenderness.  Psychiatric: She has a normal mood and affect.          Assessment & Plan:

## 2012-08-19 NOTE — Assessment & Plan Note (Signed)
UA/micro consistent with UTI. Treat with cipro 3d course. UCx not sent. Update [place] if not improving a expected.

## 2012-11-09 ENCOUNTER — Other Ambulatory Visit: Payer: Self-pay | Admitting: Neurology

## 2012-11-09 ENCOUNTER — Telehealth: Payer: Self-pay | Admitting: Neurology

## 2012-11-09 MED ORDER — PROPRANOLOL HCL 20 MG PO TABS: ORAL_TABLET | ORAL | Status: DC

## 2012-11-09 NOTE — Telephone Encounter (Signed)
Called the patient and made aware of med refill (Propranolol) and the need for a follow up appointment. Scheduled the patient for next week with Dr. Arbutus Leas. No additional concerns at this time.

## 2012-11-09 NOTE — Telephone Encounter (Signed)
Patient aware of med refill. Scheduled f/u appointment for her with Dr. Arbutus Leas next Monday.

## 2012-11-09 NOTE — Telephone Encounter (Signed)
Got a refill request from pharmacy regarding propranolol 20 mg bid.  You may refill for one month but pt needs f/u for further refills.  Please let pt know.  Been 6 months since I last saw her.

## 2012-11-15 ENCOUNTER — Encounter: Payer: Self-pay | Admitting: Neurology

## 2012-11-15 ENCOUNTER — Ambulatory Visit (INDEPENDENT_AMBULATORY_CARE_PROVIDER_SITE_OTHER): Payer: Medicare PPO | Admitting: Neurology

## 2012-11-15 VITALS — BP 126/84 | HR 80 | Temp 97.7°F | Resp 20 | Wt 183.0 lb

## 2012-11-15 DIAGNOSIS — R251 Tremor, unspecified: Secondary | ICD-10-CM

## 2012-11-15 DIAGNOSIS — R259 Unspecified abnormal involuntary movements: Secondary | ICD-10-CM

## 2012-11-15 DIAGNOSIS — G40909 Epilepsy, unspecified, not intractable, without status epilepticus: Secondary | ICD-10-CM

## 2012-11-15 DIAGNOSIS — R519 Headache, unspecified: Secondary | ICD-10-CM | POA: Insufficient documentation

## 2012-11-15 DIAGNOSIS — R51 Headache: Secondary | ICD-10-CM

## 2012-11-15 MED ORDER — PROPRANOLOL HCL 20 MG PO TABS: ORAL_TABLET | ORAL | Status: DC

## 2012-11-15 NOTE — Progress Notes (Signed)
Jamie Patterson was seen today in neurologic consultation at the request of [name], MD and Dr. [name].  Since Dr. [name] is no longer with the practice, I will be resuming the patient's care.  I have reviewed the patient's prior medical records.    The patient has had 2 episodes of what sound like seizure.  These consisted of a vocalization, falling and generalized shaking.  An MRI of the brain was done and revealed multiple T2 hyperintensities, with some concern for demyelinating disease.  She subsequently had a lumbar puncture that was negative for oligoclonal bands.  She was placed on Keppra in addition to the Lamictal that she was already on for depression.  She subsequently had a sleep deprived EEG that was negative and therefore her Keppra was weaned and her Lamictal was increased, in hopes that that could be used as monotherapy for potential seizure and depression.  She remains on Wellbutrin, but she did talk with her psychiatrist about this.  They felt that the benefits outweigh the risks.  She was also placed on Neurontin since last visit.  She has only been on it since Friday.  She was on this for anxiety.  She does not know if it is helpful yet.  The patient also has tremor and after talking with Dr. [name], the patients psychiatrist, Dr. [name] started the patient on propranolol 20 mg, 2 tablets times a day.  The patient reports that it has helped dramatically, at least 75% helpful.  She reports that tremor began after a w/d from oxycontin and valium (she went off of it "cold [place]" and did not realize that she was "addicted" and should wean it).  She may have had tremor before then but worse after that.  She could feel the tremor inside and that is much better. She is no longer having trouble with activities of daily living like she was. She still has some dizziness if she stands up quickly, bends over or turns quickly.  She has adjusted to this fairly well.  She continues to complain of chronic daily  headache.  The headaches are holocephalic.  She has daily nausea and she is not sure if that is from the headache from the multiple medications.  She does not have vomiting.  There has been no photophobia.  She does have a history of migraine.   PREVIOUS MEDICATIONS: n/a  ALLERGIES:  No Known Allergies  CURRENT MEDICATIONS:  Current Outpatient Prescriptions on File Prior to Visit  Medication Sig Dispense Refill  . acetaminophen (TYLENOL) 500 MG tablet Take 500 mg by mouth as needed.       Marland Kitchen aspirin EC 81 MG tablet Take 81 mg by mouth every evening.        Marland Kitchen buPROPion (WELLBUTRIN XL) 150 MG 24 hr tablet Take 450 mg by mouth every morning.        . calcium-vitamin D (OSCAL WITH D) 500-200 MG-UNIT per tablet Take 1 tablet by mouth every evening.        . ferrous sulfate 325 (65 FE) MG EC tablet Take 1 tablet (325 mg total) by mouth 2 (two) times daily.  60 tablet  6  . FLUoxetine (PROZAC) 40 MG capsule Take 80 mg by mouth every morning.        . lamoTRIgine (LAMICTAL) 150 MG tablet Take 1 tablet (150 mg total) by mouth 2 (two) times daily.  60 tablet  11  . Melatonin 3 MG TABS Take 3 mg by mouth 3  times/day as needed-between meals & bedtime.      . naproxen sodium (ANAPROX) 220 MG tablet Take 440 mg by mouth 2 (two) times daily with a meal.        . propranolol (INDERAL) 20 MG tablet Take as directed: Take 1 q am x 2 wks;  then take 1 q am and 1 q 6pm x 2 weeks;  take 2 q am and 1 q 6 pm x 2 wks;  take 2 po q and q 6pm thereafter  120 tablet  3  . simvastatin (ZOCOR) 40 MG tablet Take 1 tablet (40 mg total) by mouth at bedtime.  30 tablet  11  . vitamin B-12 (CYANOCOBALAMIN) 1000 MCG tablet Take 1,000 mcg by mouth daily.          PAST MEDICAL HISTORY:   Past Medical History  Diagnosis Date  . HLD (hyperlipidemia)   . Depression     followed by psych  . Chronic pain due to injury     at R ankle due to ankle fracture  . Imbalance     likely multifactorial (s/p balance training at [facility]  08/2010) ?CMT  . Right ankle pain 2003    trimalleolar fx s/p fusion  . Hypertension   . Iron deficiency anemia   . Seizure     focal, ex vacuo ventriculomegaly?, pending neuro w/u with sleep deprived EEG  . Benign essential tremor     worsened by anxiety    PAST SURGICAL HISTORY:   Past Surgical History  Procedure Date  . Ankle fusion 02/2004    Right-50% disability   . Orif ankle fracture 06/2003    Right  . Cesarean section   . Hospitalization 08/2011    syncope thought 2/2 seizure, MRI - Hyperintensity in the cerebral white matter bilaterally is nonspecific, started on Keppra    SOCIAL HISTORY:   History   Social History  . Marital Status: Divorced    Spouse Name: N/A    Number of Children: 1  . Years of Education: N/A   Occupational History  . Disabled     depression  . DISABILITY    Social History Main Topics  . Smoking status: Former Smoker -- 1.0 packs/day for 20 years    Quit date: 08/12/1995  . Smokeless tobacco: Never Used  . Alcohol Use: No     None for > 1 year  . Drug Use: No  . Sexually Active: Not on file   Other Topics Concern  . Not on file   Social History Narrative   No caffeine. Divorced. 1 child. 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, although endorses that she falls a lot     FAMILY HISTORY:   Family Status  Relation Status Death Age  . Mother Deceased 59    Died in her sleep. Had h/o Vtach   . Father Alive     a-fib  . Brother Alive     2, alive and well  . Sister Alive     alive and well  . Child Alive     alive and well    ROS:  A complete 10 system review of systems was obtained and was unremarkable apart from what is mentioned above.  PHYSICAL EXAMINATION:    VITALS:   Filed Vitals:   05/18/12 1235  BP: 130/70  Pulse: 80  Temp: 98.2 F (36.8 C)  Resp: 16  Weight:  184 lb (83.462 kg)    GEN:  Normal appears female in no acute distress.  Appears  stated age.  Appears somewhat anxious. HEENT:  Normocephalic, atraumatic. The mucous membranes are moist. The superficial temporal arteries are without ropiness or tenderness. Cardiovascular: Regular rate and rhythm. Lungs: Clear to auscultation bilaterally. Neck/Heme: There are no carotid bruits noted bilaterally.  NEUROLOGICAL: Orientation:  The patient is alert and oriented x 3.  Fund of knowledge is appropriate.  Recent and remote memory intact.  Attention span and concentration normal.  Repeats and names without difficulty.  She is very nervous in the examination room.  She wrings her hands. Cranial nerves: There is good facial symmetry. The pupils are equal round and reactive to light bilaterally. Fundoscopic exam reveals clear disc margins bilaterally. Extraocular muscles are intact and visual fields are full to confrontational testing. Speech is fluent and clear. Soft palate rises symmetrically and there is no tongue deviation. Hearing is intact to conversational tone. Tone: Tone is good throughout. Sensation: Sensation is intact to light touch throughout (facial, trunk, extremities). Vibration is intact at the bilateral big toe. There is no extinction with double simultaneous stimulation. There is no sensory dermatomal level identified. Coordination:  The patient has no difficulty with RAM's or FNF bilaterally.   Abnormal Movements:  There is some tremor of the outstretched hands.  Archimedes spirals are drawn fairly well. Motor: Strength is 5/5 in the bilateral upper and lower extremities.  Shoulder shrug is equal and symmetric. There is no pronator drift.  There are no fasciculations noted. DTR's: Deep tendon reflexes are 3/4 at the bilateral biceps, triceps, brachioradialis, patella and achilles.  Plantar responses are downgoing bilaterally. Gait and Station: The patient is able to ambulate without difficulty.   Labs:  Lab Results  Component Value Date   VITAMINB12 777 01/06/2012       Chemistry      Component Value Date/Time   NA 140 01/06/2012 1531   K 3.8 01/06/2012 1531   CL 104 01/06/2012 1531   CO2 27 01/06/2012 1531   BUN 11 01/06/2012 1531   CREATININE 0.6 01/06/2012 1531      Component Value Date/Time   CALCIUM 9.6 01/06/2012 1531   ALKPHOS 66 01/06/2012 1531   AST 25 01/06/2012 1531   ALT 23 01/06/2012 1531   BILITOT 0.6 01/06/2012 1531     Lab Results  Component Value Date   WBC 5.8 01/06/2012   HGB 13.4 01/06/2012   HCT 39.0 01/06/2012   MCV 98.8 01/06/2012   PLT 213.0 01/06/2012   Lab Results  Component Value Date   TSH 2.69 01/06/2012   Wt Readings from Last 3 Encounters:  11/15/12 183 lb (83.008 kg)  08/19/12 186 lb 4 oz (84.482 kg)  05/18/12 184 lb (83.462 kg)     IMPRESSION:  1. Essential Tremor, likely exacerbated by significant anxiety/depression. 2.  Possible seizure.  EEG has been normal, but clinically it sounds as if the patient had a seizure.  We are currently optimizing her antidepressant (Lamictal) 4 seizure control. 3.  Chronic migraine/chronic daily headache.  PLAN:  1. the propranolol is working well for tremor, but the dizziness may be a side effect from it.  Her vital signs were good today, including the pulse.  I gave her several options.  I told her that we could continue the medication as it is, change to a long-acting form or change to Mysoline.  Right now, she would like to continue with the  same medication at the same dosage (propranolol 20 mg, 2 tablets twice a day).  She will let me know if dizziness increases or if she feels near syncopal. 2.  The patient will continue to follow with psychiatry. 3.  I again addressed the Wellbutrin with the patient, since it can lower seizure threshold.  She and her psychiatrist about benefits outweigh risks. 4.  Return in about 6 months (around 05/17/2013). 5. I encouraged her to discontinue the artificially sweetened diet soda and instead increase her water intake.  If that does not help,  and the newly added Neurontin does not help, then we can consider Botox for chronic migraine.  I asked her to keep a headache diary.

## 2013-01-04 ENCOUNTER — Encounter: Payer: Self-pay | Admitting: Family Medicine

## 2013-01-04 ENCOUNTER — Telehealth: Payer: Self-pay

## 2013-01-04 ENCOUNTER — Ambulatory Visit (INDEPENDENT_AMBULATORY_CARE_PROVIDER_SITE_OTHER): Payer: Medicare PPO | Admitting: Family Medicine

## 2013-01-04 VITALS — BP 156/100 | HR 84 | Temp 98.6°F | Wt 182.2 lb

## 2013-01-04 DIAGNOSIS — J019 Acute sinusitis, unspecified: Secondary | ICD-10-CM

## 2013-01-04 DIAGNOSIS — I10 Essential (primary) hypertension: Secondary | ICD-10-CM | POA: Insufficient documentation

## 2013-01-04 MED ORDER — AMOXICILLIN-POT CLAVULANATE 875-125 MG PO TABS: 1.0000 | ORAL_TABLET | Freq: Two times a day (BID) | ORAL | Status: DC

## 2013-01-04 MED ORDER — LISINOPRIL 10 MG PO TABS: 10.0000 mg | ORAL_TABLET | Freq: Every day | ORAL | Status: DC

## 2013-01-04 NOTE — Patient Instructions (Addendum)
Blood pressure is consistently staying elevated - start lisinopril 10mg  daily.  Return in 1 week to check blood work.  Return in 4-6 weeks for f/u office visit Work on low salt /sodium diet, more water and more potassium rich foods (fruits/vegetables). For lost voice - you have inflammation of vocal cords (laryngitis).  Treat with voice rest, hard candy or throat lozenge.   For sinusitis - treat with 10d course augmentin. May use plain mucinex with plenty of water to mobilize mucous out. Update [place] if symptoms persist.

## 2013-01-04 NOTE — Assessment & Plan Note (Signed)
Overall healthy lifestyle/diet, however strong fam hx of HTN - anticipate hereditary HTN component. Persistently elevated blood pressures - discussed this and recommended med to treat. Start ACEI and return 1 wk to check Cr. rtc 4-6 wks to office for f/u.  Pt agrees with plan.

## 2013-01-04 NOTE — Progress Notes (Signed)
  Subjective:    Patient ID: Jamie Patterson, female    DOB: September 09, 1960, 52 y.o.   MRN: [id number]  HPI CC: URI sxs  bp elevated today  Consistently elevated over last several visits.  Has been on bp meds in past, but not recently. interested in starting med. BP Readings from Last 3 Encounters:  01/04/13 156/100  11/15/12 126/84  08/19/12 142/100    10 d h/o cold sxs - sore throat, rhinorrhea, fever to 101, cough, headache, toothache, facial pain.  Significant nasal drainage, some PNDrainage.  Lethargic.  Deep hoarse cough, occasionally productive.  Body aches.  Persistent drainage and facial pain on left side of face. 3d h/o lost voice.  So far has tried corsedin and afrin nasal spray.  No rashes.  No sick contacts at home.  No smokers at home. No h/o asthma, COPD.  Past Medical History  Diagnosis Date  . HLD (hyperlipidemia)   . Depression     followed by psych  . Chronic pain due to injury     at R ankle due to ankle fracture  . Imbalance     likely multifactorial (s/p balance training at [facility] 08/2010) ?CMT  . Right ankle pain 2003    trimalleolar fx s/p fusion  . Hypertension   . Iron deficiency anemia   . Seizure     focal, ex vacuo ventriculomegaly?, pending neuro w/u with sleep deprived EEG  . Benign essential tremor     worsened by anxiety  . Amblyopia       Review of Systems Per HPI    Objective:   Physical Exam  Vitals reviewed. Constitutional: She appears well-developed and well-nourished. No distress.  HENT:  Head: Normocephalic and atraumatic.  Right Ear: Hearing, external ear and ear canal normal.  Left Ear: Hearing, tympanic membrane, external ear and ear canal normal.  Nose: Mucosal edema present. No rhinorrhea. Right sinus exhibits no maxillary sinus tenderness and no frontal sinus tenderness. Left sinus exhibits no maxillary sinus tenderness and no frontal sinus tenderness.  Mouth/Throat: Uvula is midline and mucous membranes are normal. Posterior  oropharyngeal erythema present. No oropharyngeal exudate, posterior oropharyngeal edema or tonsillar abscesses.  Retracted R TM Lost voice.  Eyes: Conjunctivae and EOM are normal. Pupils are equal, round, and reactive to light. No scleral icterus.  Neck: Normal range of motion. Neck supple. No thyromegaly present.  Cardiovascular: Normal rate, regular rhythm, normal heart sounds and intact distal pulses.   No murmur heard. Pulmonary/Chest: Effort normal and breath sounds normal. No respiratory distress. She has no wheezes. She has no rales.  Lymphadenopathy:    She has no cervical adenopathy.  Skin: Skin is warm and dry. No rash noted.       Assessment & Plan:

## 2013-01-04 NOTE — Addendum Note (Signed)
Addended by: [name] on: 01/04/2013 12:27 PM   Modules accepted: Orders

## 2013-01-04 NOTE — Telephone Encounter (Signed)
Gentleman(no name given) left v/m that Augmentin was not at [facility]. Spoke with April at [facility] and pt brought in Augmentin rx, was filled and pt had already left. Unable to reach pt by phone, spoke with pts son and wanted to make sure pt had everything she needed. Jill Alexanders will speak with mom if anything else needed will call back.

## 2013-01-04 NOTE — Assessment & Plan Note (Signed)
With concommitant laryngitis.  Treat with 10d augmentin course. Supportive care as per instructions.  Discussed voice rest

## 2013-01-11 ENCOUNTER — Other Ambulatory Visit (INDEPENDENT_AMBULATORY_CARE_PROVIDER_SITE_OTHER): Payer: Medicare PPO

## 2013-01-11 DIAGNOSIS — I10 Essential (primary) hypertension: Secondary | ICD-10-CM

## 2013-01-11 LAB — BASIC METABOLIC PANEL
BUN: 13 mg/dL (ref 6–23)
Calcium: 9.2 mg/dL (ref 8.4–10.5)
GFR: 105.86 mL/min (ref >60.00)
Glucose, Bld: 102 mg/dL — ABNORMAL HIGH (ref 70–99)
Potassium: 4.7 mEq/L (ref 3.5–5.1)

## 2013-01-12 ENCOUNTER — Encounter: Payer: Self-pay | Admitting: *Deleted

## 2013-01-13 ENCOUNTER — Other Ambulatory Visit: Payer: Self-pay | Admitting: Family Medicine

## 2013-02-15 ENCOUNTER — Ambulatory Visit: Payer: Medicare PPO | Admitting: Family Medicine

## 2013-02-18 ENCOUNTER — Ambulatory Visit (INDEPENDENT_AMBULATORY_CARE_PROVIDER_SITE_OTHER): Payer: Medicare PPO | Admitting: Family Medicine

## 2013-02-18 ENCOUNTER — Encounter: Payer: Self-pay | Admitting: Family Medicine

## 2013-02-18 VITALS — BP 114/76 | HR 81 | Temp 98.4°F | Wt 188.2 lb

## 2013-02-18 DIAGNOSIS — K5900 Constipation, unspecified: Secondary | ICD-10-CM

## 2013-02-18 DIAGNOSIS — R000 Tachycardia, unspecified: Secondary | ICD-10-CM

## 2013-02-18 DIAGNOSIS — I10 Essential (primary) hypertension: Secondary | ICD-10-CM

## 2013-02-18 MED ORDER — POLYETHYLENE GLYCOL 3350 17 GM/SCOOP PO POWD: 17.0000 g | Freq: Every day | ORAL | Status: DC

## 2013-02-18 MED ORDER — LISINOPRIL 10 MG PO TABS: 10.0000 mg | ORAL_TABLET | Freq: Every day | ORAL | Status: DC

## 2013-02-18 NOTE — Patient Instructions (Signed)
Continue lisinopril as it is working well. Try miralax for constipation.  1 capful in 8oz water or fluid daily, hold for diarrhea. Good to see you today, call us with questions.

## 2013-02-18 NOTE — Assessment & Plan Note (Addendum)
Benign exam today. Discussed further workup in form of EKG and possible holter. Pt declines currently, will continue to monitor and notify me if desires further evaluation.

## 2013-02-18 NOTE — Progress Notes (Signed)
  Subjective:    Patient ID: Jamie Patterson, female    DOB: 1960-12-03, 52 y.o.   MRN: [id number]  HPI CC: recheck HTN  Elevated blood pressures previously - started lisinopril 10mg  daily last visit.  Has been checking at home and bp running better controlled - 120/70s. No HA, vision changes, CP/tightness, SOB, leg swelling.   Having constipation - takes colace 2 pills nightly.  Takes generic laxatives and vegetable laxatives every 3 days.  Hard stools.  Good water and fiber in diet.  Palpitations - notes more at night time, some skipped beats when sitting and resting.  No chest pain or irregular heart beats.    Past Medical History  Diagnosis Date  . HLD (hyperlipidemia)   . Depression     followed by psych  . Chronic pain due to injury     at R ankle due to ankle fracture  . Imbalance     likely multifactorial (s/p balance training at [facility] 08/2010) ?CMT  . Right ankle pain 2003    trimalleolar fx s/p fusion  . Hypertension   . Iron deficiency anemia   . Seizure     focal, ex vacuo ventriculomegaly?, pending neuro w/u with sleep deprived EEG  . Benign essential tremor     worsened by anxiety  . Amblyopia     Review of Systems Per HPI    Objective:   Physical Exam  Nursing note and vitals reviewed. Constitutional: She appears well-developed and well-nourished. No distress.  Cardiovascular: Normal rate, regular rhythm, normal heart sounds and intact distal pulses.   No murmur heard. Pulmonary/Chest: Effort normal and breath sounds normal. No respiratory distress. She has no wheezes. She has no rales.  Musculoskeletal: She exhibits no edema.  Psychiatric: She has a normal mood and affect.       Assessment & Plan:

## 2013-02-18 NOTE — Assessment & Plan Note (Signed)
Significant improvement on lisinopril 10mg .  Continue. Pt agrees. Lab Results  Component Value Date   TSH 2.54 01/11/2013

## 2013-02-18 NOTE — Assessment & Plan Note (Signed)
Longstanding issue since started oxycodone, never resolved when stopped narcotics. On stool softener and regular use of laxatives. rec back off laxative, start miralax bulking agent.  If not improved, will recommend start amitiza or linzess.

## 2013-03-14 ENCOUNTER — Other Ambulatory Visit: Payer: Self-pay | Admitting: Family Medicine

## 2013-03-31 ENCOUNTER — Other Ambulatory Visit: Payer: Self-pay

## 2013-03-31 MED ORDER — LAMOTRIGINE 150 MG PO TABS: 150.0000 mg | ORAL_TABLET | Freq: Two times a day (BID) | ORAL | Status: DC

## 2013-03-31 NOTE — Telephone Encounter (Signed)
Faxed refill request for lamictal, pt current on her ov's til October, will refill until then.

## 2013-06-01 ENCOUNTER — Telehealth: Payer: Self-pay

## 2013-06-01 NOTE — Telephone Encounter (Signed)
Refill request for Lamotrigine

## 2013-06-02 ENCOUNTER — Other Ambulatory Visit: Payer: Self-pay

## 2013-06-02 MED ORDER — LAMOTRIGINE 150 MG PO TABS: 150.0000 mg | ORAL_TABLET | Freq: Two times a day (BID) | ORAL | Status: DC

## 2013-06-02 NOTE — Telephone Encounter (Signed)
Refilled Lamotrigine per your instructions.

## 2013-06-02 NOTE — Telephone Encounter (Signed)
There is a medication note that patient needs to be seen in the clinic prior to refills.  She is scheduled to see Dr. Arbutus Leas on 11/4.  If she will be out of lamotrigine before her next visit, you can refill two week supply (lamotrigine 150mg  twice daily #28, no refills).  Donika K. Allena Katz, DO

## 2013-06-14 ENCOUNTER — Ambulatory Visit (INDEPENDENT_AMBULATORY_CARE_PROVIDER_SITE_OTHER): Payer: Medicare PPO | Admitting: Neurology

## 2013-06-14 ENCOUNTER — Encounter: Payer: Self-pay | Admitting: Neurology

## 2013-06-14 VITALS — BP 126/86 | HR 60 | Temp 97.5°F | Resp 12 | Ht 67.0 in | Wt 194.3 lb

## 2013-06-14 DIAGNOSIS — R413 Other amnesia: Secondary | ICD-10-CM

## 2013-06-14 DIAGNOSIS — G250 Essential tremor: Secondary | ICD-10-CM

## 2013-06-14 DIAGNOSIS — F339 Major depressive disorder, recurrent, unspecified: Secondary | ICD-10-CM

## 2013-06-14 MED ORDER — PRIMIDONE 50 MG PO TABS: 50.0000 mg | ORAL_TABLET | Freq: Every day | ORAL | Status: DC

## 2013-06-14 MED ORDER — LAMOTRIGINE 150 MG PO TABS: 150.0000 mg | ORAL_TABLET | Freq: Two times a day (BID) | ORAL | Status: DC

## 2013-06-14 NOTE — Patient Instructions (Signed)
1.  Start primidone - 50 mg - 1/2 tablet nightly for 3 nights and then increase to one tablet at night.  Once you are on one tablet at night for a week, decrease propranolol to 1 tablet twice per day for a week, then 1 tablet daily for a week and then stop the medication

## 2013-06-14 NOTE — Progress Notes (Signed)
Jamie Patterson was seen today in neurologic consultation at the request of [name], MD and Dr. [name].  Since Dr. [name] is no longer with the practice, I will be resuming the patient's care.  I have reviewed the patient's prior medical records.    The patient has had 2 episodes of what sound like seizure.  These consisted of a vocalization, falling and generalized shaking.  An MRI of the brain was done and revealed multiple T2 hyperintensities, with some concern for demyelinating disease.  She subsequently had a lumbar puncture that was negative for oligoclonal bands.  She was placed on Keppra in addition to the Lamictal that she was already on for depression.  She subsequently had a sleep deprived EEG that was negative and therefore her Keppra was weaned and her Lamictal was increased, in hopes that that could be used as monotherapy for potential seizure and depression.  She remains on Wellbutrin, but she did talk with her psychiatrist about this.  They felt that the benefits outweigh the risks.  She was also placed on Neurontin since last visit.  She has only been on it since Friday.  She was on this for anxiety.  She does not know if it is helpful yet.  The patient also has tremor and after talking with Dr. [name], the patients psychiatrist, Dr. [name] started the patient on propranolol 20 mg, 2 tablets times a day.  The patient reports that it has helped dramatically, at least 75% helpful.  She reports that tremor began after a w/d from oxycontin and valium (she went off of it "cold [place]" and did not realize that she was "addicted" and should wean it).  She may have had tremor before then but worse after that.  She could feel the tremor inside and that is much better. She is no longer having trouble with activities of daily living like she was. She still has some dizziness if she stands up quickly, bends over or turns quickly.  She has adjusted to this fairly well.  She continues to complain of chronic daily  headache.  The headaches are holocephalic.  She has daily nausea and she is not sure if that is from the headache from the multiple medications.  She does not have vomiting.  There has been no photophobia.  She does have a history of migraine.  06/14/13 update:  The patient is following up today regarding her essential tremor.  She is currently on propranolol 40 mg twice per day.  She continues to have some dizziness if she stands up quickly, bends over or turns.  She has had no seizures since our last visit.  She does remain on Wellbutrin, and understands that this lower seizure threshold.  She has talked about this with her psychiatrist and they think that the benefits outweigh risks.  She is very concerned about memory changes.  She is having word finding difficulty.  She admits to continued difficulty with uncontrolled depression, but states that she is not suicidal or homicidal.   PREVIOUS MEDICATIONS: n/a  All:  No Known Allergies  Current Outpatient Prescriptions on File Prior to Visit  Medication Sig Dispense Refill  . acetaminophen (TYLENOL) 500 MG tablet Take 500 mg by mouth as needed.       Marland Kitchen aspirin EC 81 MG tablet Take 81 mg by mouth every evening.        Marland Kitchen buPROPion (WELLBUTRIN XL) 150 MG 24 hr tablet Take 450 mg by mouth every morning.        Marland Kitchen  calcium-vitamin D (OSCAL WITH D) 500-200 MG-UNIT per tablet Take 1 tablet by mouth every evening.        Marland Kitchen FLUoxetine (PROZAC) 40 MG capsule Take 80 mg by mouth daily.       Marland Kitchen gabapentin (NEURONTIN) 300 MG capsule Take 300 mg by mouth. 0ne po qid plus two qhs      . lisinopril (PRINIVIL,ZESTRIL) 10 MG tablet Take 1 tablet (10 mg total) by mouth daily.  90 tablet  3  . Melatonin 3 MG TABS Take 10 mg by mouth at bedtime.       . naproxen sodium (ANAPROX) 220 MG tablet Take 440 mg by mouth 2 (two) times daily with a meal.       . polyethylene glycol powder (MIRALAX) powder Take 17 g by mouth daily.  255 g  0  . propranolol (INDERAL) 20 MG  tablet Take 2 po (40 mg) BID.  120 tablet  5  . simvastatin (ZOCOR) 40 MG tablet TAKE ONE TABLET BY MOUTH EVERY NIGHT AT BEDTIME  30 tablet  3  . vitamin B-12 (CYANOCOBALAMIN) 1000 MCG tablet Take 1,000 mcg by mouth daily.         No current facility-administered medications on file prior to visit.   Past Medical History  Diagnosis Date  . HLD (hyperlipidemia)   . Depression     followed by psych  . Chronic pain due to injury     at R ankle due to ankle fracture  . Imbalance     likely multifactorial (s/p balance training at [facility] 08/2010) ?CMT  . Right ankle pain 2003    trimalleolar fx s/p fusion  . Hypertension   . Iron deficiency anemia   . Seizure     focal, ex vacuo ventriculomegaly?, pending neuro w/u with sleep deprived EEG  . Benign essential tremor     worsened by anxiety  . Amblyopia    Past Surgical History  Procedure Laterality Date  . Ankle fusion  02/2004    Right-50% disability   . Orif ankle fracture  06/2003    Right  . Cesarean section    . Hospitalization  08/2011    syncope thought 2/2 seizure, MRI - Hyperintensity in the cerebral white matter bilaterally is nonspecific, started on Keppra   History   Social History  . Marital Status: Divorced    Spouse Name: N/A    Number of Children: 1  . Years of Education: N/A   Occupational History  . Disabled     depression  . DISABILITY    Social History Main Topics  . Smoking status: Former Smoker -- 1.00 packs/day for 20 years    Quit date: 08/12/1995  . Smokeless tobacco: Never Used  . Alcohol Use: No     Comment: None for > 1 year  . Drug Use: No  . Sexual Activity: Not on file   Other Topics Concern  . Not on file   Social History Narrative   No caffeine. Divorced. 1 child. 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, although endorses that she falls a lot     ROS:  A complete 10 system review of systems was obtained and was  unremarkable apart from what is mentioned above.  PHYSICAL EXAMINATION:    Filed Vitals:   06/14/13 1300  BP: 126/86  Pulse: 60  Temp: 97.5 F (36.4 C)  Resp: 12    GEN:  Normal appears female in no acute distress.  Appears stated age.  Appears somewhat anxious. HEENT:  Normocephalic, atraumatic. The mucous membranes are moist. The superficial temporal arteries are without ropiness or tenderness. Cardiovascular: Regular rate and rhythm. Lungs: Clear to auscultation bilaterally. Neck/Heme: There are no carotid bruits noted bilaterally.  NEUROLOGICAL: Orientation:  The patient is alert and oriented x 3.  Fund of knowledge is appropriate.  Recent and remote memory intact.  Attention span and concentration normal.  Repeats and names without difficulty.  She is very nervous in the examination room.  She wrings her hands. Cranial nerves: There is good facial symmetry. The pupils are equal round and reactive to light bilaterally. Fundoscopic exam reveals clear disc margins bilaterally. Extraocular muscles are intact and visual fields are full to confrontational testing. Speech is fluent and clear. Soft palate rises symmetrically and there is no tongue deviation. Hearing is intact to conversational tone. Tone: Tone is good throughout. Sensation: Sensation is intact to light touch throughout (facial, trunk, extremities). Vibration is intact at the bilateral big toe. There is no extinction with double simultaneous stimulation. There is no sensory dermatomal level identified. Coordination:  The patient has no difficulty with RAM's or FNF bilaterally.   Abnormal Movements:  There is some tremor of the outstretched hands.  Archimedes spirals are drawn fairly well. Motor: Strength is 5/5 in the bilateral upper and lower extremities.  Shoulder shrug is equal and symmetric. There is no pronator drift.  There are no fasciculations noted. DTR's: Deep tendon reflexes are 3/4 at the bilateral biceps, triceps,  brachioradialis, patella and achilles.  Plantar responses are downgoing bilaterally. Gait and Station: The patient is able to ambulate without difficulty.   Labs:   Wt Readings from Last 3 Encounters:  06/14/13 194 lb 4.8 oz (88.134 kg)  02/18/13 188 lb 4 oz (85.39 kg)  01/04/13 182 lb 4 oz (82.668 kg)   Lab Results  Component Value Date   VITAMINB12 777 01/06/2012   Lab Results  Component Value Date   TSH 2.54 01/11/2013     IMPRESSION:  1. Essential Tremor, likely exacerbated by significant anxiety/depression. 2.  Possible seizure.  EEG has been normal, but clinically it sounds as if the patient had a seizure.  We are currently optimizing her antidepressant (Lamictal) for seizure control.  She understands that I do not necessarily like her being on Wellbutrin. 3.  Chronic migraine/chronic daily headache.  This has improved some 4.  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  PLAN:  1. the propranolol is working well for tremor, but the dizziness may be a side effect from it.  Her vital signs were good today, including the pulse.  I gave her several options.  I told her that we could continue the medication as it is, change to a long-acting form or change to Mysoline.  Today, she decided that she would like to change to Mysoline and we will slowly wean her off of the propranolol, 40 mg twice per day.   2.  The patient will continue to follow with psychiatry.  We will order neuropsychometric testing for the memory changes. 3.  I again addressed the Wellbutrin with the patient, since it can lower seizure threshold.  She and her psychiatrist think benefits outweigh risks. 4.  Return in about 6 months (around 12/12/2013). 5. I encouraged her to discontinue the artificially sweetened diet soda and instead increase her water intake.  If that does not help, and the newly added Neurontin  does not help, then we can consider Botox for chronic migraine.  I asked her to keep a  headache diary.

## 2013-06-20 ENCOUNTER — Other Ambulatory Visit: Payer: Self-pay

## 2013-06-20 MED ORDER — PROPRANOLOL HCL 20 MG PO TABS: ORAL_TABLET | ORAL | Status: DC

## 2013-06-20 NOTE — Telephone Encounter (Signed)
Find out if pt actually needs refills as I was tapering her off that med when I saw her last visit

## 2013-06-20 NOTE — Telephone Encounter (Signed)
Pt called and explained that she needs about 30 pills because she thought she had enough to taper off drug but it turns out she doesn't.

## 2013-07-12 ENCOUNTER — Telehealth: Payer: Self-pay

## 2013-07-12 MED ORDER — PROPRANOLOL HCL 40 MG PO TABS: 40.0000 mg | ORAL_TABLET | Freq: Every day | ORAL | Status: DC

## 2013-07-12 NOTE — Telephone Encounter (Signed)
Pt called today to inform Dr.Tat that her tremor came back today all of the sudden and she is having trouble talking, writing, eating, etc. She took her last dose of Propanolol on 11/26 and started Primidone on 11/4. She wants to know if she should come in to the office, she is miserable. Please advise.

## 2013-07-12 NOTE — Telephone Encounter (Signed)
Called pt and informed her that her rx was sent to pharmacy.

## 2013-07-12 NOTE — Telephone Encounter (Signed)
I called pt and she agreed to your instructions. She needs a new rx sent to pharmacy for Propranolol.

## 2013-07-12 NOTE — Telephone Encounter (Signed)
I sent RX.  Please let the pt know.

## 2013-07-12 NOTE — Telephone Encounter (Signed)
Lets try having her just take the propranolol once per day, 40 mg, instead of the twice she was doing.  Have her let me know if the dizziness comes back

## 2013-07-20 ENCOUNTER — Other Ambulatory Visit: Payer: Self-pay | Admitting: Family Medicine

## 2013-08-03 ENCOUNTER — Encounter: Payer: Self-pay | Admitting: Family Medicine

## 2013-08-03 ENCOUNTER — Ambulatory Visit (INDEPENDENT_AMBULATORY_CARE_PROVIDER_SITE_OTHER): Payer: Medicare PPO | Admitting: Family Medicine

## 2013-08-03 VITALS — BP 130/74 | HR 84 | Temp 98.0°F | Wt 188.5 lb

## 2013-08-03 DIAGNOSIS — J019 Acute sinusitis, unspecified: Secondary | ICD-10-CM | POA: Insufficient documentation

## 2013-08-03 MED ORDER — AMOXICILLIN-POT CLAVULANATE 875-125 MG PO TABS: 1.0000 | ORAL_TABLET | Freq: Two times a day (BID) | ORAL | Status: AC

## 2013-08-03 MED ORDER — HYDROCODONE-HOMATROPINE 5-1.5 MG/5ML PO SYRP: 5.0000 mL | ORAL_SOLUTION | Freq: Two times a day (BID) | ORAL | Status: DC | PRN

## 2013-08-03 NOTE — Patient Instructions (Signed)
You have a sinus infection. Take medicine as prescribed: augmentin 10 day course and hycodan mainly for night time. Push fluids and plenty of rest. Nasal saline irrigation or neti pot to help drain sinuses. May use simple mucinex with plenty of fluid to help mobilize mucous. Let us know if fever >101.5, trouble opening/closing mouth, difficulty swallowing, or worsening - you may need to be seen again.

## 2013-08-03 NOTE — Progress Notes (Signed)
   Subjective:    Patient ID: Jamie Patterson, female    DOB: 07/11/1961, 52 y.o.   MRN: [id number]  HPI CC: cough  1+ wk h/o cold sxs (ST, RN, congestion, sneezing).  Cough getting worse.  Lost voice for last 4-5 days.  Bloody mucous from nose.  R ear congested, popping.  Main discomfort is at right ear.  Significant pressure with sharp pains with coughing.  bodyaches worse at night and feels completely exhausted.  Decreased appetite and decreased taste.  Some dizziness comes and goes.  So far has tried [facility] nyquil/dayquil cold and flu.   Husband sick 2 wks ago, now feeling better. No smokers at home.  Former smoker, quit 1997. No h/o asthma,COPD.  Wt Readings from Last 3 Encounters:  08/03/13 188 lb 8 oz (85.503 kg)  06/14/13 194 lb 4.8 oz (88.134 kg)  02/18/13 188 lb 4 oz (85.39 kg)    Past Medical History  Diagnosis Date  . HLD (hyperlipidemia)   . Depression     followed by psych  . Chronic pain due to injury     at R ankle due to ankle fracture  . Imbalance     likely multifactorial (s/p balance training at [facility] 08/2010) ?CMT  . Right ankle pain 2003    trimalleolar fx s/p fusion  . Hypertension   . Iron deficiency anemia   . Seizure     focal, ex vacuo ventriculomegaly?, pending neuro w/u with sleep deprived EEG  . Benign essential tremor     worsened by anxiety  . Amblyopia     Review of Systems Per HPI    Objective:   Physical Exam  Nursing note and vitals reviewed. Constitutional: She appears well-developed and well-nourished. No distress.  HENT:  Head: Normocephalic and atraumatic.  Right Ear: External ear and ear canal normal. Decreased hearing is noted.  Left Ear: Hearing, tympanic membrane, external ear and ear canal normal.  Ears:  Nose: Mucosal edema present. No rhinorrhea. Right sinus exhibits no maxillary sinus tenderness and no frontal sinus tenderness. Left sinus exhibits no maxillary sinus tenderness and no frontal sinus tenderness.  Mouth/Throat: Uvula  is midline and mucous membranes are normal. Posterior oropharyngeal erythema present. No oropharyngeal exudate, posterior oropharyngeal edema or tonsillar abscesses.  Collection of fluid behind R ear, but no erythema or bulge  Eyes: Conjunctivae and EOM are normal. Pupils are equal, round, and reactive to light. No scleral icterus.  Neck: Normal range of motion. Neck supple.  Cardiovascular: Normal rate, regular rhythm, normal heart sounds and intact distal pulses.   No murmur heard. Pulmonary/Chest: Effort normal and breath sounds normal. No respiratory distress. She has no wheezes. She has no rales.  Musculoskeletal: She exhibits no edema.  Lymphadenopathy:    She has no cervical adenopathy.  Skin: Skin is warm and dry. No rash noted.       Assessment & Plan:

## 2013-08-03 NOTE — Progress Notes (Signed)
Pre-visit discussion using our clinic review tool. No additional management support is needed unless otherwise documented below in the visit note.  

## 2013-08-03 NOTE — Assessment & Plan Note (Signed)
Anticipate acute bacterial sinusitis with acute serous otitis of right ear. Will treat with augmentin and recommended start nasal saline irrigation. Deferred INS 2/2 recent epistaxis. Update if sxs persist or worsen.

## 2013-08-16 ENCOUNTER — Other Ambulatory Visit: Payer: Self-pay | Admitting: Family Medicine

## 2013-08-16 ENCOUNTER — Ambulatory Visit (INDEPENDENT_AMBULATORY_CARE_PROVIDER_SITE_OTHER): Payer: Medicare PPO | Admitting: Internal Medicine

## 2013-08-16 VITALS — BP 126/82 | HR 97 | Temp 98.0°F | Wt 188.5 lb

## 2013-08-16 DIAGNOSIS — R059 Cough, unspecified: Secondary | ICD-10-CM

## 2013-08-16 DIAGNOSIS — R05 Cough: Secondary | ICD-10-CM

## 2013-08-16 MED ORDER — HYDROCODONE-HOMATROPINE 5-1.5 MG/5ML PO SYRP: 5.0000 mL | ORAL_SOLUTION | Freq: Two times a day (BID) | ORAL | Status: DC | PRN

## 2013-08-16 NOTE — Progress Notes (Signed)
HPI  Pt presents to the clinic today with c/o cough. She reports that it is lingering and non productive. She was treated for a sinus infection about 2 weeks ago. She has finished her abx. The cough is only bothersome at night and keeping her awake. She does not cough much during the day. She denies fever, chills or body aches.  Review of Systems      Past Medical History  Diagnosis Date  . HLD (hyperlipidemia)   . Depression     followed by psych  . Chronic pain due to injury     at R ankle due to ankle fracture  . Imbalance     likely multifactorial (s/p [facility]/2010) ?CMT  . Right ankle pain 2003    trimalleolar fx s/p fusion  . Hypertension   . Iron deficiency anemia   . Seizure     focal, ex vacuo ventriculomegaly?, pending neuro w/u with sleep deprived EEG  . Benign essential tremor     worsened by anxiety  . Amblyopia     Family History  Problem Relation Age of Onset  . Atrial fibrillation Father   . Hypertension Father   . Osteoarthritis Father   . Aortic aneurysm Father   . Depression Mother   . Hepatitis Brother     Hep. C  . Anemia Sister   . Arrhythmia Mother [id number]    Dx with VTach after having sudden collapse, did NOT have h/o AMI's     History   Social History  . Marital Status: Divorced    Spouse Name: N/A    Number of Children: 1  . Years of Education: N/A   Occupational History  . Disabled     depression  . DISABILITY    Social History Main Topics  . Smoking status: Former Smoker -- 1.00 packs/day for 20 years    Quit date: 08/12/1995  . Smokeless tobacco: Never Used  . Alcohol Use: No     Comment: None for > 1 year  . Drug Use: No  . Sexual Activity: Not on file   Other Topics Concern  . Not on file   Social History Narrative   No caffeine. Divorced. 1 child. 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 Has a cane but hasn't needed it recently, although endorses that she falls a lot     No  Known Allergies   Constitutional:  Denies headache, fatigue, fever or abrupt weight changes.  HEENT:  Denies eye redness, eye pain, pressure behind the eyes, facial pain, nasal congestion, ear pain, ringing in the ears, wax buildup, runny nose or bloody nose. Respiratory: Positive cough. Denies difficulty breathing or shortness of breath.  Cardiovascular: Denies chest pain, chest tightness, palpitations or swelling in the hands or feet.   No other specific complaints in a complete review of systems (except as listed in HPI above).  Objective:   BP 126/82  Pulse 97  Temp(Src) 98 F (36.7 C) (Oral)  Wt 188 lb 8 oz (85.503 kg)  SpO2 98%  LMP 06/01/2013 Wt Readings from Last 3 Encounters:  08/16/13 188 lb 8 oz (85.503 kg)  08/03/13 188 lb 8 oz (85.503 kg)  06/14/13 194 lb 4.8 oz (88.134 kg)     General: Appears her stated age, well developed, well nourished in NAD. HEENT: Head: normal shape and size; Eyes: sclera white, no icterus, conjunctiva pink, PERRLA and EOMs intact; Ears: Tm's gray and intact, normal light reflex;  Nose: mucosa pink and moist, septum midline; Throat/Mouth: + PND. Teeth present, mucosa erythematous and moist, no exudate noted, no lesions or ulcerations noted.  Neck: Mild cervical lymphadenopathy. Neck supple, trachea midline. No massses, lumps or thyromegaly present.  Cardiovascular: Normal rate and rhythm. S1,S2 noted.  No murmur, rubs or gallops noted. No JVD or BLE edema. No carotid bruits noted. Pulmonary/Chest: Normal effort and positive vesicular breath sounds. No respiratory distress. No wheezes, rales or ronchi noted.      Assessment & Plan:   Cough s/p acute sinusitis  Get some rest and drink plenty of water No indication for repeat abx at this time eRx for Hycodan cough syrup  RTC as needed or if symptoms persist.

## 2013-08-16 NOTE — Patient Instructions (Signed)

## 2013-08-16 NOTE — Progress Notes (Signed)
Pre-visit discussion using our clinic review tool. No additional management support is needed unless otherwise documented below in the visit note.  

## 2013-09-13 ENCOUNTER — Other Ambulatory Visit: Payer: Self-pay | Admitting: Family Medicine

## 2013-09-13 ENCOUNTER — Other Ambulatory Visit (INDEPENDENT_AMBULATORY_CARE_PROVIDER_SITE_OTHER): Payer: Medicare PPO

## 2013-09-13 DIAGNOSIS — E7800 Pure hypercholesterolemia, unspecified: Secondary | ICD-10-CM

## 2013-09-13 DIAGNOSIS — I10 Essential (primary) hypertension: Secondary | ICD-10-CM

## 2013-09-13 LAB — BASIC METABOLIC PANEL
BUN: 13 mg/dL (ref 6–23)
CHLORIDE: 104 meq/L (ref 96–112)
CO2: 25 meq/L (ref 19–32)
Calcium: 9.5 mg/dL (ref 8.4–10.5)
Creatinine, Ser: 0.7 mg/dL (ref 0.4–1.2)
GFR: 98.34 mL/min (ref >60.00)
Glucose, Bld: 85 mg/dL (ref 70–99)
Potassium: 5.1 mEq/L (ref 3.5–5.1)
SODIUM: 135 meq/L (ref 135–145)

## 2013-09-13 LAB — LIPID PANEL
CHOL/HDL RATIO: 2
Cholesterol: 174 mg/dL (ref 0–200)
HDL: 73.5 mg/dL (ref >39.00)
LDL Cholesterol: 92 mg/dL (ref 0–99)
Triglycerides: 42 mg/dL (ref 0.0–149.0)
VLDL: 8.4 mg/dL (ref 0.0–40.0)

## 2013-09-13 LAB — TSH: TSH: 4.33 u[IU]/mL (ref 0.35–5.50)

## 2013-09-14 ENCOUNTER — Telehealth: Payer: Self-pay | Admitting: Family Medicine

## 2013-09-14 NOTE — Telephone Encounter (Signed)
Relevant patient education assigned to patient using Emmi. ° °

## 2013-09-16 ENCOUNTER — Telehealth: Payer: Self-pay

## 2013-09-16 ENCOUNTER — Other Ambulatory Visit: Payer: Self-pay | Admitting: Family Medicine

## 2013-09-16 MED ORDER — SIMVASTATIN 40 MG PO TABS: ORAL_TABLET | ORAL | Status: DC

## 2013-09-16 NOTE — Telephone Encounter (Signed)
plz notify blood work stable. Continue simvastatin - sent [facility] to [facility].

## 2013-09-16 NOTE — Telephone Encounter (Signed)
Patient notified

## 2013-09-16 NOTE — Telephone Encounter (Signed)
Pt left v/m pt request lab results of labs done on 09/13/13 and if lab ok pt request [facility]; lab results note; will discuss at [facility] but pt does not have future appt scheduled.Please advise.

## 2013-12-13 ENCOUNTER — Ambulatory Visit: Payer: Medicare PPO | Admitting: Neurology

## 2013-12-19 ENCOUNTER — Encounter: Payer: Self-pay | Admitting: Neurology

## 2013-12-19 ENCOUNTER — Ambulatory Visit (INDEPENDENT_AMBULATORY_CARE_PROVIDER_SITE_OTHER): Payer: Medicare PPO | Admitting: Neurology

## 2013-12-19 VITALS — BP 142/80 | HR 88 | Resp 18 | Ht 67.0 in | Wt 194.0 lb

## 2013-12-19 DIAGNOSIS — G252 Other specified forms of tremor: Secondary | ICD-10-CM

## 2013-12-19 DIAGNOSIS — G40909 Epilepsy, unspecified, not intractable, without status epilepticus: Secondary | ICD-10-CM

## 2013-12-19 DIAGNOSIS — R413 Other amnesia: Secondary | ICD-10-CM

## 2013-12-19 DIAGNOSIS — G250 Essential tremor: Secondary | ICD-10-CM

## 2013-12-19 MED ORDER — PROPRANOLOL HCL 40 MG PO TABS: 40.0000 mg | ORAL_TABLET | Freq: Every day | ORAL | Status: DC

## 2013-12-19 MED ORDER — LAMOTRIGINE 150 MG PO TABS: 150.0000 mg | ORAL_TABLET | Freq: Two times a day (BID) | ORAL | Status: DC

## 2013-12-19 MED ORDER — PRIMIDONE 50 MG PO TABS: 50.0000 mg | ORAL_TABLET | Freq: Every day | ORAL | Status: DC

## 2013-12-19 NOTE — Patient Instructions (Addendum)
1. You have been referred to Dr Karen Sullivan for Neuro Psych testing. They will call you directly to schedule an appointment. Please call [phone] if you do not hear from them.    

## 2013-12-19 NOTE — Progress Notes (Signed)
Jamie Patterson was seen today in neurologic consultation at the request of [name], MD and Dr. [name].  Since Dr. [name] is no longer with the practice, I will be resuming the patient's care.  I have reviewed the patient's prior medical records.    The patient has had 2 episodes of what sound like seizure.  These consisted of a vocalization, falling and generalized shaking.  An MRI of the brain was done and revealed multiple T2 hyperintensities, with some concern for demyelinating disease.  She subsequently had a lumbar puncture that was negative for oligoclonal bands.  She was placed on Keppra in addition to the Lamictal that she was already on for depression.  She subsequently had a sleep deprived EEG that was negative and therefore her Keppra was weaned and her Lamictal was increased, in hopes that that could be used as monotherapy for potential seizure and depression.  She remains on Wellbutrin, but she did talk with her psychiatrist about this.  They felt that the benefits outweigh the risks.  She was also placed on Neurontin since last visit.  She has only been on it since Friday.  She was on this for anxiety.  She does not know if it is helpful yet.  The patient also has tremor and after talking with Dr. [name], the patients psychiatrist, Dr. [name] started the patient on propranolol 20 mg, 2 tablets times a day.  The patient reports that it has helped dramatically, at least 75% helpful.  She reports that tremor began after a w/d from oxycontin and valium (she went off of it "cold [place]" and did not realize that she was "addicted" and should wean it).  She may have had tremor before then but worse after that.  She could feel the tremor inside and that is much better. She is no longer having trouble with activities of daily living like she was. She still has some dizziness if she stands up quickly, bends over or turns quickly.  She has adjusted to this fairly well.  She continues to complain of chronic daily  headache.  The headaches are holocephalic.  She has daily nausea and she is not sure if that is from the headache from the multiple medications.  She does not have vomiting.  There has been no photophobia.  She does have a history of migraine.  06/14/13 update:  The patient is following up today regarding her essential tremor.  She is currently on propranolol 40 mg twice per day.  She continues to have some dizziness if she stands up quickly, bends over or turns.  She has had no seizures since our last visit.  She does remain on Wellbutrin, and understands that this lower seizure threshold.  She has talked about this with her psychiatrist and they think that the benefits outweigh risks.  She is very concerned about memory changes.  She is having word finding difficulty.  She admits to continued difficulty with uncontrolled depression, but states that she is not suicidal or homicidal.  12/19/12 update:  The patient is following up on essential tremor.  She is currently on Mysoline, and last visit we tried to stop the propranolol because of dizziness.  Unfortunately, tremor increased and we had to restart the propranolol but restarted it at daily dosing instead of twice a day dosing.  She is no longer dizzy and the tremor is better.  She has some "jerking" and twitching spells; is just one single jerk of an arm or leg.  She is on gabapentin, 300 mg, 1 in the AM and 2 at night.  She is on that for psychiatric reasons.  Not sure it is helping.  Is seeing psychiatry but insurance will no longer pay for counseling, and she cannot afford it, although she thought it was beneficial.  She also has a history of seizure and is on Lamictal.  Unfortunately, she remains on Wellbutrin for depression.  She has been seizure free, however.  She still complains of memory issues.  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 She was supposed to have neuropsych testing completed before this  visit but it was never scheduled.     PREVIOUS MEDICATIONS: n/a  All:  No Known Allergies  Current Outpatient Prescriptions on File Prior to Visit  Medication Sig Dispense Refill  . acetaminophen (TYLENOL) 500 MG tablet Take 500 mg by mouth as needed.       Marland Kitchen. aspirin EC 81 MG tablet Take 81 mg by mouth every evening.        Marland Kitchen. buPROPion (WELLBUTRIN XL) 150 MG 24 hr tablet Take 450 mg by mouth every morning.        . calcium-vitamin D (OSCAL WITH D) 500-200 MG-UNIT per tablet Take 1 tablet by mouth every evening.        Marland Kitchen. FLUoxetine (PROZAC) 40 MG capsule Take 80 mg by mouth daily.       Marland Kitchen. gabapentin (NEURONTIN) 300 MG capsule Take 300 mg by mouth. 0ne po qid plus two qhs      . lisinopril (PRINIVIL,ZESTRIL) 10 MG tablet Take 1 tablet (10 mg total) by mouth daily.  90 tablet  3  . Melatonin 3 MG TABS Take 10 mg by mouth at bedtime.       . naproxen sodium (ANAPROX) 220 MG tablet Take 440 mg by mouth 2 (two) times daily with a meal.       . simvastatin (ZOCOR) 40 MG tablet TAKE ONE (1) TABLET BY MOUTH AT BEDTIME  30 tablet  11  . vitamin B-12 (CYANOCOBALAMIN) 1000 MCG tablet Take 1,000 mcg by mouth daily.         No current facility-administered medications on file prior to visit.   Past Medical History  Diagnosis Date  . HLD (hyperlipidemia)   . Depression     followed by psych  . Chronic pain due to injury     at R ankle due to ankle fracture  . Imbalance     likely multifactorial (s/p balance [facility]/2010) ?CMT  . Right ankle pain 2003    trimalleolar fx s/p fusion  . Hypertension   . Iron deficiency anemia   . Seizure     focal, ex vacuo ventriculomegaly?, pending neuro w/u with sleep deprived EEG  . Benign essential tremor     worsened by anxiety  . Amblyopia    Past Surgical History  Procedure Laterality Date  . Ankle fusion  02/2004    Right-50% disability   . Orif ankle fracture  06/2003    Right  . Cesarean section    . Hospitalization  08/2011     syncope thought 2/2 seizure, MRI - Hyperintensity in the cerebral white matter bilaterally is nonspecific, started on Keppra   History   Social History  . Marital Status: Divorced    Spouse Name: N/A    Number of Children: 1  . Years of Education: N/A   Occupational History  .  Disabled     depression  . DISABILITY    Social History Main Topics  . Smoking status: Former Smoker -- 1.00 packs/day for 20 years    Quit date: 08/12/1995  . Smokeless tobacco: Never Used  . Alcohol Use: No     Comment: None for > 1 year  . Drug Use: No  . Sexual Activity: Not on file   Other Topics Concern  . Not on file   Social History Narrative   No caffeine. Divorced. 1 child. 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 Has a cane but hasn't needed it recently, although endorses that she falls a lot     ROS:  A complete 10 system review of systems was obtained and was unremarkable apart from what is mentioned above.  PHYSICAL EXAMINATION:    Filed Vitals:   12/19/13 0859  BP: 142/80  Pulse: 88  Resp: 18    GEN:  Normal appears female in no acute distress.  Appears stated age.  Appears somewhat anxious. HEENT:  Normocephalic, atraumatic. The mucous membranes are moist. The superficial temporal arteries are without ropiness or tenderness. Cardiovascular: Regular rate and rhythm. Lungs: Clear to auscultation bilaterally. Neck/Heme: There are no carotid bruits noted bilaterally.  NEUROLOGICAL: Orientation:  The patient is alert and oriented x 3.  Fund of knowledge is appropriate.  Recent and remote memory intact.  Attention span and concentration normal.  Repeats and names without difficulty.   Cranial nerves: There is good facial symmetry.  Extraocular muscles are intact and visual fields are full to confrontational testing. Speech is fluent and clear. Soft palate rises symmetrically and there is no tongue deviation. Hearing is intact to conversational tone. Tone: Tone  is good throughout. Sensation: Sensation is intact to light touch throughout (facial, trunk, extremities). Coordination:  The patient has no difficulty with RAM's or FNF bilaterally.   Abnormal Movements:  There is some tremor of the outstretched hands.  Archimedes spirals are drawn fairly well. Motor: Strength is 5/5 in the bilateral upper and lower extremities.  Shoulder shrug is equal and symmetric. There is no pronator drift.  There are no fasciculations noted. DTR's: Deep tendon reflexes are 3/4 at the bilateral biceps, triceps, brachioradialis, patella and achilles.  Plantar responses are downgoing bilaterally. Gait and Station: The patient has an antalgic, waddling gait today.  States that previous surgeries prevent her from bending her ankles to walk.  Labs:   Wt Readings from Last 3 Encounters:  12/19/13 194 lb (87.998 kg)  08/16/13 188 lb 8 oz (85.503 kg)  08/03/13 188 lb 8 oz (85.503 kg)   Lab Results  Component Value Date   VITAMINB12 777 01/06/2012   Lab Results  Component Value Date   TSH 4.33 09/13/2013     Chemistry      Component Value Date/Time   NA 135 09/13/2013 1422   K 5.1 09/13/2013 1422   CL 104 09/13/2013 1422   CO2 25 09/13/2013 1422   BUN 13 09/13/2013 1422   CREATININE 0.7 09/13/2013 1422      Component Value Date/Time   CALCIUM 9.5 09/13/2013 1422   ALKPHOS 66 01/06/2012 1531   AST 25 01/06/2012 1531   ALT 23 01/06/2012 1531   BILITOT 0.6 01/06/2012 1531        IMPRESSION:  1. Essential Tremor, likely exacerbated by significant anxiety/depression. 2.  Possible seizure.  EEG has been normal, but clinically it sounds as if the patient had a seizure.  We  are currently optimizing her antidepressant (Lamictal) for seizure control.  She understands that I do not necessarily like her being on Wellbutrin. 3.  Chronic migraine/chronic daily headache.  This has improved some 4.  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 5.  ?  Myoclonus  PLAN:  1. the propranolol is working well for tremor, but she had dizziness at twice a day dosing, but did not do well and we discontinued it.  She seems to be at a good balance with propranolol, 40 mg once daily and primidone 50 mg once daily. 2.  The patient will continue to follow with psychiatry.  We will order neuropsychometric testing for the memory changes with Dr. Lendell Caprice.  3.  I again addressed the Wellbutrin with the patient, since it can lower seizure threshold.  She and her psychiatrist think benefits outweigh risks.  She will remain on Lamictal. 4.  I think she may be experiencing some myoclonus from Neurontin.  I did not see this today, but she was describing it.  She is on the Neurontin for psychiatric reasons.  When this happens, it can be a dose-dependent issue.  I told her to talk to her psychiatrist about the potential side effect and potentially they can just slightly lower the dose. 4.  Return in about 6 months (around 06/21/2014).

## 2013-12-20 ENCOUNTER — Telehealth: Payer: Self-pay | Admitting: Neurology

## 2013-12-20 NOTE — Telephone Encounter (Signed)
Referral faxed to Dr Karen Sullivan at [facility] at [phone] with confirmation received.  

## 2014-02-04 ENCOUNTER — Telehealth: Payer: Self-pay | Admitting: Neurology

## 2014-02-04 DIAGNOSIS — F329 Major depressive disorder, single episode, unspecified: Secondary | ICD-10-CM

## 2014-02-04 DIAGNOSIS — F32A Depression, unspecified: Secondary | ICD-10-CM

## 2014-02-04 NOTE — Telephone Encounter (Signed)
Jade, I reviewed neuropsych testing results.  She already went over results with Dr. Lendell CapriceSullivan per Dr. Bayard BeaverSullivans notes.  If patient willing and will sign release, I would like to send a copy of this to patients psychiatrist to see if they can help getting her some affordable counseling options.  You can also see what cost would be for her to see Dr Dellia CloudGutterman or one of his associates and we could provide that referral.

## 2014-02-06 NOTE — Telephone Encounter (Signed)
Left message on machine for patient to call back.

## 2014-02-08 NOTE — Telephone Encounter (Signed)
Spoke with patient and she would like us to send results to Dr Evelene CroonKaur. I will fax results once they are scanned into our system. She would also like a referral to Dr Dawayne CirriGutterman's group, but she prefers to see a woman. They have to make appts with patient directly so number given to patient and referral placed in our system.

## 2014-04-06 ENCOUNTER — Ambulatory Visit (INDEPENDENT_AMBULATORY_CARE_PROVIDER_SITE_OTHER): Payer: Medicare PPO | Admitting: Psychology

## 2014-04-06 DIAGNOSIS — F4323 Adjustment disorder with mixed anxiety and depressed mood: Secondary | ICD-10-CM

## 2014-04-20 ENCOUNTER — Ambulatory Visit: Payer: Medicare PPO | Admitting: Psychology

## 2014-04-27 ENCOUNTER — Other Ambulatory Visit: Payer: Self-pay | Admitting: Family Medicine

## 2014-06-21 ENCOUNTER — Encounter: Payer: Self-pay | Admitting: Neurology

## 2014-06-21 ENCOUNTER — Ambulatory Visit (INDEPENDENT_AMBULATORY_CARE_PROVIDER_SITE_OTHER): Payer: Medicare PPO | Admitting: Neurology

## 2014-06-21 VITALS — BP 105/70 | HR 74 | Ht 67.0 in | Wt 209.7 lb

## 2014-06-21 DIAGNOSIS — F331 Major depressive disorder, recurrent, moderate: Secondary | ICD-10-CM

## 2014-06-21 DIAGNOSIS — G250 Essential tremor: Secondary | ICD-10-CM

## 2014-06-21 DIAGNOSIS — G253 Myoclonus: Secondary | ICD-10-CM

## 2014-06-21 NOTE — Patient Instructions (Signed)
1.  You can try Irven CoeLisa Holbrook at [phone].  She is a Journalist, newspaperlicensed counselor. 2.  I will see you in 6 months.  You look great!

## 2014-06-21 NOTE — Progress Notes (Signed)
Jamie Patterson was seen today in neurologic consultation at the request of Jamie BoydenJavier Gutierrez, MD and Dr. Modesto Patterson.  Since Dr. Modesto Patterson is no longer with the practice, I will be resuming the patient's care.  I have reviewed the patient's prior medical records.    The patient has had 2 episodes of what sound like seizure.  These consisted of a vocalization, falling and generalized shaking.  An MRI of the brain was done and revealed multiple T2 hyperintensities, with some concern for demyelinating disease.  She subsequently had a lumbar puncture that was negative for oligoclonal bands.  She was placed on Keppra in addition to the Lamictal that she was already on for depression.  She subsequently had a sleep deprived EEG that was negative and therefore her Keppra was weaned and her Lamictal was increased, in hopes that that could be used as monotherapy for potential seizure and depression.  She remains on Wellbutrin, but she did talk with her psychiatrist about this.  They felt that the benefits outweigh the risks.  She was also placed on Neurontin since last visit.  She has only been on it since Friday.  She was on this for anxiety.  She does not know if it is helpful yet.  The patient also has tremor and after talking with Dr. Evelene Patterson, the patients psychiatrist, Dr. Modesto Patterson started the patient on propranolol 20 mg, 2 tablets times a day.  The patient reports that it has helped dramatically, at least 75% helpful.  She reports that tremor began after a w/d from oxycontin and valium (she went off of it "cold Jamie Patterson" and did not realize that she was "addicted" and should wean it).  She may have had tremor before then but worse after that.  She could feel the tremor inside and that is much better. She is no longer having trouble with activities of daily living like she was. She still has some dizziness if she stands up quickly, bends over or turns quickly.  She has adjusted to this fairly well.  She continues to complain of chronic daily  headache.  The headaches are holocephalic.  She has daily nausea and she is not sure if that is from the headache from the multiple medications.  She does not have vomiting.  There has been no photophobia.  She does have a history of migraine.  06/14/13 update:  The patient is following up today regarding her essential tremor.  She is currently on propranolol 40 mg twice per day.  She continues to have some dizziness if she stands up quickly, bends over or turns.  She has had no seizures since our last visit.  She does remain on Wellbutrin, and understands that this lower seizure threshold.  She has talked about this with her psychiatrist and they think that the benefits outweigh risks.  She is very concerned about memory changes.  She is having word finding difficulty.  She admits to continued difficulty with uncontrolled depression, but states that she is not suicidal or homicidal.  12/19/13 update:  The patient is following up on essential tremor.  She is currently on Mysoline, and last visit we tried to stop the propranolol because of dizziness.  Unfortunately, tremor increased and we had to restart the propranolol but restarted it at daily dosing instead of twice a day dosing.  She is no longer dizzy and the tremor is better.  She has some "jerking" and twitching spells; is just one single jerk of an arm or leg.  She is on gabapentin, 300 mg, 1 in the AM and 2 at night.  She is on that for psychiatric reasons.  Not sure it is helping.  Is seeing psychiatry but insurance will no longer pay for counseling, and she cannot afford it, although she thought it was beneficial.  She also has a history of seizure and is on Lamictal.  Unfortunately, she remains on Wellbutrin for depression.  She has been seizure free, however.  She still complains of memory issues.  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 She was supposed to have neuropsych testing completed before this  visit but it was never scheduled.    06/21/14 update:  Pt returns today for follow-up regarding tremor.  She is on a combination of low-dose propranolol, 40 mg daily, and primidone 50 mg nightly.  Last visit, the patient was describing what sounded like myoclonus.  I thought that perhaps it was secondary to her Neurontin, which is being prescribed by psychiatry.  It was decreased and the jerking went away.  She went off of the Wellbutrin, changed to cymbalta and increased it a week ago.  She is still depressed and asks about recommendations for counselors.  She had great luck with Jamie Patterson but it was just too far to travel for routine f/u.  She did have neuropsych testing with Jamie Patterson that demonstrated major depression, social phobia.  All memory testing was normal and no evidence of amnestic memory d/o.  No evidence of neurodegenerative process.   PREVIOUS MEDICATIONS: n/a  All:  No Known Allergies  Current Outpatient Prescriptions on File Prior to Visit  Medication Sig Dispense Refill  . acetaminophen (TYLENOL) 500 MG tablet Take 500 mg by mouth as needed.     Marland Kitchen aspirin EC 81 MG tablet Take 81 mg by mouth every evening.      . calcium-vitamin D (OSCAL WITH D) 500-200 MG-UNIT per tablet Take 1 tablet by mouth every evening.      Marland Kitchen FLUoxetine (PROZAC) 40 MG capsule Take 40 mg by mouth daily.     Marland Kitchen gabapentin (NEURONTIN) 300 MG capsule Take 300 mg by mouth. 0ne po bid    . lamoTRIgine (LAMICTAL) 150 MG tablet Take 1 tablet (150 mg total) by mouth 2 (two) times daily. 180 tablet 3  . lisinopril (PRINIVIL,ZESTRIL) 10 MG tablet TAKE 1 TABLET BY MOUTH DAILY 90 tablet 0  . Melatonin 3 MG TABS Take 10 mg by mouth at bedtime.     . naproxen sodium (ANAPROX) 220 MG tablet Take 440 mg by mouth 2 (two) times daily with a meal.     . primidone (MYSOLINE) 50 MG tablet Take 1 tablet (50 mg total) by mouth at bedtime. 90 tablet 3  . propranolol (INDERAL) 40 MG tablet Take 1 tablet (40 mg total) by  mouth daily. 90 tablet 3  . simvastatin (ZOCOR) 40 MG tablet TAKE ONE (1) TABLET BY MOUTH AT BEDTIME 30 tablet 11  . vitamin B-12 (CYANOCOBALAMIN) 1000 MCG tablet Take 1,000 mcg by mouth daily.       No current facility-administered medications on file prior to visit.   Past Medical History  Diagnosis Date  . HLD (hyperlipidemia)   . Depression     followed by psych  . Chronic pain due to injury     at R ankle due to ankle fracture  . Imbalance     likely multifactorial (s/p balance training at [facility] 08/2010) ?CMT  .  Right ankle pain 2003    trimalleolar fx s/p fusion  . Hypertension   . Iron deficiency anemia   . Seizure     focal, ex vacuo ventriculomegaly?, pending neuro w/u with sleep deprived EEG  . Benign essential tremor     worsened by anxiety  . Amblyopia    Past Surgical History  Procedure Laterality Date  . Ankle fusion  02/2004    Right-50% disability   . Orif ankle fracture  06/2003    Right  . Cesarean section    . Hospitalization  08/2011    syncope thought 2/2 seizure, MRI - Hyperintensity in the cerebral white matter bilaterally is nonspecific, started on Keppra   History   Social History  . Marital Status: Divorced    Spouse Name: N/A    Number of Children: 1  . Years of Education: N/A   Occupational History  . Disabled     depression  . DISABILITY    Social History Main Topics  . Smoking status: Former Smoker -- 1.00 packs/day for 20 years    Quit date: 08/12/1995  . Smokeless tobacco: Never Used  . Alcohol Use: No     Comment: None for > 1 year  . Drug Use: No  . Sexual Activity: Not on file   Other Topics Concern  . Not on file   Social History Narrative   No caffeine. Divorced. 1 child. 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, although endorses that she falls a lot     ROS:  A complete 10 system review of systems was obtained and was unremarkable apart from what is  mentioned above.  PHYSICAL EXAMINATION:    Filed Vitals:   06/21/14 0908  BP: 105/70  Pulse: 74    GEN:  Normal appears female in no acute distress.  Appears stated age.  Appears somewhat anxious. HEENT:  Normocephalic, atraumatic. The mucous membranes are moist. The superficial temporal arteries are without ropiness or tenderness. Cardiovascular: Regular rate and rhythm. Lungs: Clear to auscultation bilaterally. Neck/Heme: There are no carotid bruits noted bilaterally.  NEUROLOGICAL: Orientation:  The patient is alert and oriented x 3.  Fund of knowledge is appropriate.  Recent and remote memory intact.  Attention span and concentration normal.  Repeats and names without difficulty.   Cranial nerves: There is good facial symmetry.  Extraocular muscles are intact and visual fields are full to confrontational testing. Speech is fluent and clear. Soft palate rises symmetrically and there is no tongue deviation. Hearing is intact to conversational tone. Tone: Tone is good throughout. Sensation: Sensation is intact to light touch throughout (facial, trunk, extremities). Coordination:  The patient has no difficulty with RAM's or FNF bilaterally.   Abnormal Movements:  There is some tremor of the outstretched hands.  Archimedes spirals are drawn fairly well. Motor: Strength is 5/5 in the bilateral upper and lower extremities.  Shoulder shrug is equal and symmetric. There is no pronator drift.  There are no fasciculations noted. Gait and Station: The patient has an antalgic, waddling gait today.  States that previous surgeries prevent her from bending her ankles to walk.  Labs:   Wt Readings from Last 3 Encounters:  06/21/14 209 lb 11.2 oz (95.119 kg)  12/19/13 194 lb (87.998 kg)  08/16/13 188 lb 8 oz (85.503 kg)   Lab Results  Component Value Date   VITAMINB12 777 01/06/2012   Lab Results  Component Value  Date   TSH 4.33 09/13/2013     Chemistry      Component Value Date/Time    NA 135 09/13/2013 1422   K 5.1 09/13/2013 1422   CL 104 09/13/2013 1422   CO2 25 09/13/2013 1422   BUN 13 09/13/2013 1422   CREATININE 0.7 09/13/2013 1422      Component Value Date/Time   CALCIUM 9.5 09/13/2013 1422   ALKPHOS 66 01/06/2012 1531   AST 25 01/06/2012 1531   ALT 23 01/06/2012 1531   BILITOT 0.6 01/06/2012 1531        IMPRESSION:  1. Essential Tremor, likely exacerbated by significant anxiety/depression. 2.  Possible seizure.  EEG has been normal, but clinically it sounds as if the patient had a seizure.  We are currently optimizing her antidepressant (Lamictal) for seizure control.  She is off of wellbutrin for depression 3.  Chronic migraine/chronic daily headache.  This has improved some 4.  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  Neuropsych testing was negative in this regard, demonstrating only major depression 5.  ? Myoclonus - resolved with decreased gabapentin dose  PLAN:  1. the propranolol is working well for tremor, but she had dizziness at twice a day dosing, but did not do well and we discontinued it.  She seems to be at a good balance with propranolol, 40 mg once daily and primidone 50 mg once daily. 2.  Continue with Dr. Evelene Patterson.  Given name of Irven CoeLisa Holbrook, [facility] for counseling 3.  F/u 6 months

## 2014-07-27 ENCOUNTER — Other Ambulatory Visit: Payer: Self-pay | Admitting: Family Medicine

## 2014-09-20 ENCOUNTER — Other Ambulatory Visit: Payer: Self-pay | Admitting: Family Medicine

## 2014-12-19 ENCOUNTER — Encounter: Payer: Self-pay | Admitting: Neurology

## 2014-12-19 ENCOUNTER — Telehealth: Payer: Self-pay | Admitting: Neurology

## 2014-12-19 ENCOUNTER — Ambulatory Visit (INDEPENDENT_AMBULATORY_CARE_PROVIDER_SITE_OTHER): Payer: Medicare PPO | Admitting: Neurology

## 2014-12-19 ENCOUNTER — Ambulatory Visit: Payer: Medicare PPO | Admitting: Neurology

## 2014-12-19 VITALS — BP 90/68 | HR 72 | Ht 66.0 in | Wt 213.0 lb

## 2014-12-19 DIAGNOSIS — F411 Generalized anxiety disorder: Secondary | ICD-10-CM

## 2014-12-19 DIAGNOSIS — F331 Major depressive disorder, recurrent, moderate: Secondary | ICD-10-CM

## 2014-12-19 DIAGNOSIS — G250 Essential tremor: Secondary | ICD-10-CM | POA: Diagnosis not present

## 2014-12-19 NOTE — Telephone Encounter (Signed)
Patient made aware she could call Dr Dan HumphreysWalker and schedule a new patient appt.

## 2014-12-19 NOTE — Progress Notes (Signed)
Jamie Patterson was seen today in neurologic consultation at the request of Jamie BoydenJavier Gutierrez, MD and [name].  Since [name] is no longer with the practice, I will be resuming the patient's care.  I have reviewed the patient's prior medical records.    The patient has had 2 episodes of what sound like seizure.  These consisted of a vocalization, falling and generalized shaking.  An MRI of the brain was done and revealed multiple T2 hyperintensities, with some concern for demyelinating disease.  She subsequently had a lumbar puncture that was negative for oligoclonal bands.  She was placed on Keppra in addition to the Lamictal that she was already on for depression.  She subsequently had a sleep deprived EEG that was negative and therefore her Keppra was weaned and her Lamictal was increased, in hopes that that could be used as monotherapy for potential seizure and depression.  She remains on Wellbutrin, but she did talk with her psychiatrist about this.  They felt that the benefits outweigh the risks.  She was also placed on Neurontin since last visit.  She has only been on it since Friday.  She was on this for anxiety.  She does not know if it is helpful yet.  The patient also has tremor and after talking with [name], the patients psychiatrist, [name] started the patient on propranolol 20 mg, 2 tablets times a day.  The patient reports that it has helped dramatically, at least 75% helpful.  She reports that tremor began after a w/d from oxycontin and valium (she went off of it "cold Jamie Patterson" and did not realize that she was "addicted" and should wean it).  She may have had tremor before then but worse after that.  She could feel the tremor inside and that is much better. She is no longer having trouble with activities of daily living like she was. She still has some dizziness if she stands up quickly, bends over or turns quickly.  She has adjusted to this fairly well.  She continues to complain of chronic daily  headache.  The headaches are holocephalic.  She has daily nausea and she is not sure if that is from the headache from the multiple medications.  She does not have vomiting.  There has been no photophobia.  She does have a history of migraine.  06/14/13 update:  The patient is following up today regarding her essential tremor.  She is currently on propranolol 40 mg twice per day.  She continues to have some dizziness if she stands up quickly, bends over or turns.  She has had no seizures since our last visit.  She does remain on Wellbutrin, and understands that this lower seizure threshold.  She has talked about this with her psychiatrist and they think that the benefits outweigh risks.  She is very concerned about memory changes.  She is having word finding difficulty.  She admits to continued difficulty with uncontrolled depression, but states that she is not suicidal or homicidal.  12/19/13 update:  The patient is following up on essential tremor.  She is currently on Mysoline, and last visit we tried to stop the propranolol because of dizziness.  Unfortunately, tremor increased and we had to restart the propranolol but restarted it at daily dosing instead of twice a day dosing.  She is no longer dizzy and the tremor is better.  She has some "jerking" and twitching spells; is just one single jerk of an arm or leg.  She is on gabapentin, 300 mg, 1 in the AM and 2 at night.  She is on that for psychiatric reasons.  Not sure it is helping.  Is seeing psychiatry but insurance will no longer pay for counseling, and she cannot afford it, although she thought it was beneficial.  She also has a history of seizure and is on Lamictal.  Unfortunately, she remains on Wellbutrin for depression.  She has been seizure free, however.  She still complains of memory issues.  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 She was supposed to have neuropsych testing completed before this  visit but it was never scheduled.    06/21/14 update:  Pt returns today for follow-up regarding tremor.  She is on a combination of low-dose propranolol, 40 mg daily, and primidone 50 mg nightly.  Last visit, the patient was describing what sounded like myoclonus.  I thought that perhaps it was secondary to her Neurontin, which is being prescribed by psychiatry.  It was decreased and the jerking went away.  She went off of the Wellbutrin, changed to cymbalta and increased it a week ago.  She is still depressed and asks about recommendations for counselors.  She had great luck with Dr. Lendell Caprice but it was just too far to travel for routine f/u.  She did have neuropsych testing with Dr. Lendell Caprice that demonstrated major depression, social phobia.  All memory testing was normal and no evidence of amnestic memory d/o.  No evidence of neurodegenerative process.  12/19/14 update:  Pt returns for follow up.  She is on propranolol 40 mg and propranolol 50 q hs.  Tremor has perhaps increased some, but she thinks maybe that is due to increased anxiety.  She really does not want to change her medications.  She does have a hx of depression.  Dr. [name] just doubled her cymbalta to 60 mg and her prozac was d/c.  It has only been 2 weeks.  She admits that her anxiety and depression have been fairly disabling.  She is not suicidal or homicidal.  She has not even been back to her primary care physician in over a year and a half.  She likes him but overall would rather have a female provider because of things that she has experienced in the past, and because she would rather have 1 provider who did her female exams.  She asks me about this today.  She d/c her BP med and her cholesterol med because she knew she had to go back to the dr for a refill and she was anxious about that.  She still does not have a Veterinary surgeon.   PREVIOUS MEDICATIONS: n/a  All:  No Known Allergies  Current Outpatient Prescriptions on File Prior to Visit   Medication Sig Dispense Refill  . acetaminophen (TYLENOL) 500 MG tablet Take 500 mg by mouth as needed.     Marland Kitchen aspirin EC 81 MG tablet Take 81 mg by mouth every evening.      . calcium-vitamin D (OSCAL WITH D) 500-200 MG-UNIT per tablet Take 1 tablet by mouth every evening.      . DULoxetine (CYMBALTA) 60 MG capsule Take 60 mg by mouth 2 (two) times daily.     Marland Kitchen gabapentin (NEURONTIN) 300 MG capsule Take 300 mg by mouth. 0ne po bid    . lamoTRIgine (LAMICTAL) 150 MG tablet Take 1 tablet (150 mg total) by mouth 2 (two) times daily. 180 tablet 3  .  Melatonin 3 MG TABS Take 10 mg by mouth at bedtime.     . naproxen sodium (ANAPROX) 220 MG tablet Take 440 mg by mouth 2 (two) times daily with a meal.     . primidone (MYSOLINE) 50 MG tablet Take 1 tablet (50 mg total) by mouth at bedtime. 90 tablet 3  . propranolol (INDERAL) 40 MG tablet Take 1 tablet (40 mg total) by mouth daily. 90 tablet 3  . vitamin B-12 (CYANOCOBALAMIN) 1000 MCG tablet Take 1,000 mcg by mouth daily.       No current facility-administered medications on file prior to visit.   Past Medical History  Diagnosis Date  . HLD (hyperlipidemia)   . Depression     followed by psych  . Chronic pain due to injury     at R ankle due to ankle fracture  . Imbalance     likely multifactorial (s/p [facility] 08/2010) ?CMT  . Right ankle pain 2003    trimalleolar fx s/p fusion  . Hypertension   . Iron deficiency anemia   . Seizure     focal, ex vacuo ventriculomegaly?, pending neuro w/u with sleep deprived EEG  . Benign essential tremor     worsened by anxiety  . Amblyopia    Past Surgical History  Procedure Laterality Date  . Ankle fusion  02/2004    Right-50% disability   . Orif ankle fracture  06/2003    Right  . Cesarean section    . Hospitalization  08/2011    syncope thought 2/2 seizure, MRI - Hyperintensity in the cerebral white matter bilaterally is nonspecific, started on Keppra   History   Social  History  . Marital Status: Divorced    Spouse Name: N/A  . Number of Children: 1  . Years of Education: N/A   Occupational History  . Disabled     depression  . DISABILITY    Social History Main Topics  . Smoking status: Former Smoker -- 1.00 packs/day for 20 years    Quit date: 08/12/1995  . Smokeless tobacco: Never Used  . Alcohol Use: No     Comment: None for > 1 year  . Drug Use: No  . Sexual Activity: Not on file   Other Topics Concern  . Not on file   Social History Narrative   No caffeine. Divorced. 1 child. 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 Has a cane but hasn't needed it recently, although endorses that she falls a lot     ROS:  A complete 10 system review of systems was obtained and was unremarkable apart from what is mentioned above.  PHYSICAL EXAMINATION:    Filed Vitals:   12/19/14 1120  BP: 90/68  Pulse: 72    GEN:  Normal appears female in no acute distress.  Appears stated age.  Appears somewhat anxious and teary. HEENT:  Normocephalic, atraumatic. The mucous membranes are moist. The superficial temporal arteries are without ropiness or tenderness.   NEUROLOGICAL: Orientation:  The patient is alert and oriented x 3.   Cranial nerves: There is good facial symmetry.  Extraocular muscles are intact and visual fields are full to confrontational testing. Speech is fluent and clear. Soft palate rises symmetrically and there is no tongue deviation. Hearing is intact to conversational tone. Tone: Tone is good throughout. Sensation: Sensation is intact to light touch throughout (facial, trunk, extremities). Coordination:  The patient has no difficulty with RAM's or FNF bilaterally.  Abnormal Movements:  There is some tremor of the outstretched hands, left more than right Motor: Strength is 5/5 in the bilateral upper and lower extremities.  Shoulder shrug is equal and symmetric. There is no pronator drift.  There are no  fasciculations noted. Gait and Station: The patient has an antalgic gait today.  States that previous surgeries prevent her from bending her ankles to walk.  Labs:   Wt Readings from Last 3 Encounters:  12/19/14 213 lb (96.616 kg)  06/21/14 209 lb 11.2 oz (95.119 kg)  12/19/13 194 lb (87.998 kg)   Lab Results  Component Value Date   VITAMINB12 777 01/06/2012   Lab Results  Component Value Date   TSH 4.33 09/13/2013     Chemistry      Component Value Date/Time   NA 135 09/13/2013 1422   K 5.1 09/13/2013 1422   CL 104 09/13/2013 1422   CO2 25 09/13/2013 1422   BUN 13 09/13/2013 1422   CREATININE 0.7 09/13/2013 1422      Component Value Date/Time   CALCIUM 9.5 09/13/2013 1422   ALKPHOS 66 01/06/2012 1531   AST 25 01/06/2012 1531   ALT 23 01/06/2012 1531   BILITOT 0.6 01/06/2012 1531        IMPRESSION:  1. Essential Tremor, likely exacerbated by significant anxiety/depression. 2.  Possible seizure.  EEG has been normal, but clinically it sounds as if the patient had a seizure.  We are currently optimizing her antidepressant (Lamictal) for seizure control.  She is off of wellbutrin for depression 3.  Chronic migraine/chronic daily headache.  This has improved some 4.  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  Neuropsych testing was negative in this regard, demonstrating only major depression 5.  ? Myoclonus - resolved with decreased gabapentin dose  PLAN:  1. She seems to be at a good balance with propranolol, 40 mg once daily and primidone 50 mg once daily.  BP was a bit low and she has even d/c her BP on her own.  She is monitoring at home 2.  Continue with [name].  Long discussion about mental health and the importance of physical exercise in mental health.  Again, told her that I think she needs a Veterinary surgeoncounselor.  She really has completely avoided much of her physical health as well.  She has not been back to her primary care physician in over 15  months.  She expresses that she really needs/wants a female physician (although she likes her current one), partly because of her anxiety and comfort level, but also because she would like a primary care physician in [place] who could take care of her breast exam/female exams as well.  I talked to her about various options but especially talked with her about not avoiding medical care all together.  Much greater than 50% of 25 min in counseling. 3.  F/u 6 months

## 2014-12-19 NOTE — Patient Instructions (Signed)
6 month follow up  Dr Ronna PolioJennifer Walker MD Address: [address], [place], [place] [address]  [phone]

## 2014-12-28 ENCOUNTER — Other Ambulatory Visit: Payer: Self-pay | Admitting: Family Medicine

## 2014-12-28 MED ORDER — LAMOTRIGINE 150 MG PO TABS: 150.0000 mg | ORAL_TABLET | Freq: Two times a day (BID) | ORAL | Status: DC

## 2014-12-28 NOTE — Telephone Encounter (Signed)
Received faxed 90 day refill request from pharmacy.

## 2015-01-02 ENCOUNTER — Telehealth: Payer: Self-pay | Admitting: Neurology

## 2015-01-02 NOTE — Telephone Encounter (Signed)
Called Dr Theodoro ParmaJennifer Patterson's office and made appt for patient to establish care. February 20, 2015 at 9:15 am. Patient made aware. Will call with any questions.

## 2015-01-02 NOTE — Telephone Encounter (Signed)
Pt called and wanted to let Dr Tat know that the referral she gave her for Dr Tilman NeatWalker's office was not taking any new patients, cb# [phone]/Dawn

## 2015-01-19 ENCOUNTER — Telehealth: Payer: Self-pay | Admitting: Neurology

## 2015-01-19 MED ORDER — PROPRANOLOL HCL 40 MG PO TABS: 40.0000 mg | ORAL_TABLET | Freq: Every day | ORAL | Status: DC

## 2015-01-19 MED ORDER — PRIMIDONE 50 MG PO TABS: 50.0000 mg | ORAL_TABLET | Freq: Every day | ORAL | Status: DC

## 2015-01-19 NOTE — Telephone Encounter (Signed)
Patient made aware this was e-scribed on 12/20/2014. Called the pharmacy and they state they never received it. Tried to call it in, but pharmacist unavailable. RX faxed to [facility] at [phone] with confirmation received.

## 2015-01-19 NOTE — Telephone Encounter (Signed)
Pt called stating her meds have not been called in. Says at her last visit she was told by Dr. Arbutus Leas that she was sending her refills in as they spoke. She went to the pharmacy and was told nothing had been sent in. Pt isn't at home so she wasn't sure what was being sent it. Please call  [phone] / Sherri S.

## 2015-02-20 ENCOUNTER — Ambulatory Visit (INDEPENDENT_AMBULATORY_CARE_PROVIDER_SITE_OTHER): Payer: Medicare PPO | Admitting: Internal Medicine

## 2015-02-20 ENCOUNTER — Encounter (INDEPENDENT_AMBULATORY_CARE_PROVIDER_SITE_OTHER): Payer: Self-pay

## 2015-02-20 DIAGNOSIS — Z01419 Encounter for gynecological examination (general) (routine) without abnormal findings: Secondary | ICD-10-CM

## 2015-02-20 NOTE — Progress Notes (Deleted)
Pre visit review using our clinic review tool, if applicable. No additional management support is needed unless otherwise documented below in the visit note. 

## 2015-02-20 NOTE — Progress Notes (Signed)
   Subjective:    Patient ID: Jamie BangAmy C Votta, female    DOB: 10/19/1960, 54 y.o.   MRN: [id number]  HPI  No Charge. Visit made in error.  Past medical, surgical, family and social history per today's encounter.  Review of Systems     Objective:    There were no vitals taken for this visit. Physical Exam        Assessment & Plan:   Problem List Items Addressed This Visit    None       No Follow-up on file.

## 2015-03-21 ENCOUNTER — Ambulatory Visit (INDEPENDENT_AMBULATORY_CARE_PROVIDER_SITE_OTHER): Payer: Medicare PPO | Admitting: Primary Care

## 2015-03-21 ENCOUNTER — Encounter: Payer: Self-pay | Admitting: Primary Care

## 2015-03-21 VITALS — BP 110/72 | HR 94 | Temp 98.1°F | Ht 67.0 in | Wt 206.0 lb

## 2015-03-21 DIAGNOSIS — R21 Rash and other nonspecific skin eruption: Secondary | ICD-10-CM

## 2015-03-21 MED ORDER — CLOTRIMAZOLE 1 % EX CREA: 1.0000 "application " | TOPICAL_CREAM | Freq: Two times a day (BID) | CUTANEOUS | Status: DC

## 2015-03-21 NOTE — Patient Instructions (Signed)
Apply Clotrimazole cream twice daily to affected areas. Keep the skin dry as moisture causes irritation.  Follow up if no improvement in the next 2 weeks.  It was nice to meet you!

## 2015-03-21 NOTE — Progress Notes (Signed)
Subjective:    Patient ID: Jamie Patterson, female    DOB: 02/26/1961, 54 y.o.   MRN: [id number]  HPI  Jamie Patterson is a 54 year old female who presents today with a chief complaint of rash. Her rash is present under bilateral breasts and bilateral groin. Her rash has been present for the past week and has been worsening over the past several days. She's tried using hydrocortisone, cornstarch, polysporin without relief. She reports itching and is painful when scratching. Denies fevers.  Review of Systems  Constitutional: Negative for fever.  Respiratory: Negative for shortness of breath.   Cardiovascular: Negative for chest pain.  Genitourinary: Negative for vaginal discharge.  Musculoskeletal: Negative for myalgias.  Skin: Positive for rash.       Past Medical History  Diagnosis Date  . HLD (hyperlipidemia)   . Depression     followed by psych  . Chronic pain due to injury     at R ankle due to ankle fracture  . Imbalance     likely multifactorial (s/p balance training at [facility] 08/2010) ?CMT  . Right ankle pain 2003    trimalleolar fx s/p fusion  . Hypertension   . Iron deficiency anemia   . Seizure     focal, ex vacuo ventriculomegaly?, pending neuro w/u with sleep deprived EEG  . Benign essential tremor     worsened by anxiety  . Amblyopia     Social History   Social History  . Marital Status: Divorced    Spouse Name: N/A  . Number of Children: 1  . Years of Education: N/A   Occupational History  . Disabled     depression  . DISABILITY    Social History Main Topics  . Smoking status: Former Smoker -- 1.00 packs/day for 20 years    Quit date: 08/12/1995  . Smokeless tobacco: Never Used  . Alcohol Use: No     Comment: None for > 1 year  . Drug Use: No  . Sexual Activity: Not on file   Other Topics Concern  . Not on file   Social History Narrative   No caffeine. Divorced. 1 child. 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 but  hasn't needed it recently, although endorses that she falls a lot     Past Surgical History  Procedure Laterality Date  . Ankle fusion  02/2004    Right-50% disability   . Orif ankle fracture  06/2003    Right  . Cesarean section    . Hospitalization  08/2011    syncope thought 2/2 seizure, MRI - Hyperintensity in the cerebral white matter bilaterally is nonspecific, started on Keppra    Family History  Problem Relation Age of Onset  . Atrial fibrillation Father   . Hypertension Father   . Osteoarthritis Father   . Aortic aneurysm Father   . Depression Mother   . Hepatitis Brother     Hep. C  . Anemia Sister   . Arrhythmia Mother 74    Dx with VTach after having sudden collapse, did NOT have h/o AMI's     No Known Allergies  Current Outpatient Prescriptions on File Prior to Visit  Medication Sig Dispense Refill  . acetaminophen (TYLENOL) 500 MG tablet Take 500 mg by mouth as needed.     Marland Kitchen aspirin EC 81 MG tablet Take 81 mg by mouth every evening.      . calcium-vitamin D (OSCAL WITH D) 500-200  MG-UNIT per tablet Take 1 tablet by mouth every evening.      . DULoxetine (CYMBALTA) 60 MG capsule Take 60 mg by mouth 2 (two) times daily.     Marland Kitchen gabapentin (NEURONTIN) 300 MG capsule Take 300 mg by mouth 4 (four) times daily. 0ne po bid    . lamoTRIgine (LAMICTAL) 150 MG tablet Take 1 tablet (150 mg total) by mouth 2 (two) times daily. 180 tablet 1  . Melatonin 3 MG TABS Take 10 mg by mouth at bedtime.     . Multiple Vitamin (MULTIVITAMIN) tablet Take 1 tablet by mouth daily.    . naproxen sodium (ANAPROX) 220 MG tablet Take 440 mg by mouth 2 (two) times daily with a meal.     . primidone (MYSOLINE) 50 MG tablet Take 1 tablet (50 mg total) by mouth at bedtime. 90 tablet 3  . propranolol (INDERAL) 40 MG tablet Take 1 tablet (40 mg total) by mouth daily. 90 tablet 3  . vitamin B-12 (CYANOCOBALAMIN) 1000 MCG tablet Take 500 mcg by mouth daily.      No current facility-administered  medications on file prior to visit.    BP 110/72 mmHg  Pulse 94  Temp(Src) 98.1 F (36.7 C) (Oral)  Ht 5\' 7"  (1.702 m)  Wt 206 lb (93.441 kg)  BMI 32.26 kg/m2  SpO2 98%    Objective:   Physical Exam  Constitutional: She appears well-nourished.  Cardiovascular: Normal rate and regular rhythm.   Pulmonary/Chest: Effort normal and breath sounds normal.  Skin: Skin is warm and dry. Rash noted.  Fungal appearing rash under bilateral breasts (moderate) and to bilateral groin (mild). No bleeding or drainage.          Assessment & Plan:  Rash:  Appears to be fungal in nature. Present to bilateral breasts and groin. RX for clotrimazole cream to apply BID. Keep area dry. Follow up PRN

## 2015-03-21 NOTE — Progress Notes (Signed)
Pre visit review using our clinic review tool, if applicable. No additional management support is needed unless otherwise documented below in the visit note. 

## 2015-03-26 ENCOUNTER — Other Ambulatory Visit: Payer: Self-pay | Admitting: Primary Care

## 2015-03-26 NOTE — Telephone Encounter (Signed)
Electronically refill request Last prescribed for acute visit on 03/21/2015 clotrimazole (LOTRIMIN) 1 % cream  Dispense: 30 g   Refills: 0  Patient was seen on 03/21/2015.

## 2015-04-30 ENCOUNTER — Encounter: Payer: Self-pay | Admitting: Family Medicine

## 2015-04-30 ENCOUNTER — Ambulatory Visit (INDEPENDENT_AMBULATORY_CARE_PROVIDER_SITE_OTHER): Payer: Medicare PPO | Admitting: Family Medicine

## 2015-04-30 VITALS — BP 126/84 | HR 76 | Temp 98.2°F | Wt 193.0 lb

## 2015-04-30 DIAGNOSIS — E669 Obesity, unspecified: Secondary | ICD-10-CM | POA: Diagnosis not present

## 2015-04-30 DIAGNOSIS — I10 Essential (primary) hypertension: Secondary | ICD-10-CM

## 2015-04-30 DIAGNOSIS — J209 Acute bronchitis, unspecified: Secondary | ICD-10-CM

## 2015-04-30 DIAGNOSIS — R053 Chronic cough: Secondary | ICD-10-CM | POA: Insufficient documentation

## 2015-04-30 DIAGNOSIS — R05 Cough: Secondary | ICD-10-CM | POA: Insufficient documentation

## 2015-04-30 MED ORDER — GUAIFENESIN-CODEINE 100-10 MG/5ML PO SYRP: 5.0000 mL | ORAL_SOLUTION | Freq: Every evening | ORAL | Status: DC | PRN

## 2015-04-30 MED ORDER — AZITHROMYCIN 250 MG PO TABS: ORAL_TABLET | ORAL | Status: DC

## 2015-04-30 NOTE — Progress Notes (Signed)
Pre visit review using our clinic review tool, if applicable. No additional management support is needed unless otherwise documented below in the visit note. 

## 2015-04-30 NOTE — Assessment & Plan Note (Signed)
Resolved with weight loss.   

## 2015-04-30 NOTE — Assessment & Plan Note (Signed)
Given duration and progression of sxs will treat for acute bacterial bronchitis, cover for atypicals with zpack. cheratussin for night time cough. NSAID for bronchial inflammation. See pt instructions.

## 2015-04-30 NOTE — Patient Instructions (Addendum)
I think you have developed bronchitis. Treat with zpack and cough syrup for night time.  Push fluids and rest. Let us know if not improving as expected or persistent cough despite this.  Try ibuprofen for lung inflammation - 400-600mg  with food.  Acute Bronchitis Bronchitis is inflammation of the airways that extend from the windpipe into the lungs (bronchi). The inflammation often causes mucus to develop. This leads to a cough, which is the most common symptom of bronchitis.  In acute bronchitis, the condition usually develops suddenly and goes away over time, usually in a couple weeks. Smoking, allergies, and asthma can make bronchitis worse. Repeated episodes of bronchitis may cause further lung problems.  CAUSES Acute bronchitis is most often caused by the same virus that causes a cold. The virus can spread from person to person (contagious) through coughing, sneezing, and touching contaminated objects. SIGNS AND SYMPTOMS   Cough.   Fever.   Coughing up mucus.   Body aches.   Chest congestion.   Chills.   Shortness of breath.   Sore throat.  DIAGNOSIS  Acute bronchitis is usually diagnosed through a physical exam. Your health care Chip Canepa will also ask you questions about your medical history. Tests, such as chest X-rays, are sometimes done to rule out other conditions.  TREATMENT  Acute bronchitis usually goes away in a couple weeks. Oftentimes, no medical treatment is necessary. Medicines are sometimes given for relief of fever or cough. Antibiotic medicines are usually not needed but may be prescribed in certain situations. In some cases, an inhaler may be recommended to help reduce shortness of breath and control the cough. A cool mist vaporizer may also be used to help thin bronchial secretions and make it easier to clear the chest.  HOME CARE INSTRUCTIONS  Get plenty of rest.   Drink enough fluids to keep your urine clear or pale yellow (unless you have a  medical condition that requires fluid restriction). Increasing fluids may help thin your respiratory secretions (sputum) and reduce chest congestion, and it will prevent dehydration.   Take medicines only as directed by your health care Dayshaun Whobrey.  If you were prescribed an antibiotic medicine, finish it all even if you start to feel better.  Avoid smoking and secondhand smoke. Exposure to cigarette smoke or irritating chemicals will make bronchitis worse. If you are a smoker, consider using nicotine gum or skin patches to help control withdrawal symptoms. Quitting smoking will help your lungs heal faster.   Reduce the chances of another bout of acute bronchitis by washing your hands frequently, avoiding people with cold symptoms, and trying not to touch your hands to your mouth, nose, or eyes.   Keep all follow-up visits as directed by your health care Kasumi Ditullio.  SEEK MEDICAL CARE IF: Your symptoms do not improve after 1 week of treatment.  SEEK IMMEDIATE MEDICAL CARE IF:  You develop an increased fever or chills.   You have chest pain.   You have severe shortness of breath.  You have bloody sputum.   You develop dehydration.  You faint or repeatedly feel like you are going to pass out.  You develop repeated vomiting.  You develop a severe headache. MAKE SURE YOU:   Understand these instructions.  Will watch your condition.  Will get help right away if you are not doing well or get worse. Document Released: 09/04/2004 Document Revised: 12/12/2013 Document Reviewed: 01/18/2013 [facility] Patient Information 2015 [place], [place]. This information is not intended to replace advice given  to you by your health care Chanley Mcenery. Make sure you discuss any questions you have with your health care Yoshiye Kraft.  

## 2015-04-30 NOTE — Progress Notes (Signed)
BP 126/84 mmHg  Pulse 76  Temp(Src) 98.2 F (36.8 C) (Oral)  Wt 193 lb (87.544 kg)  SpO2 97%  LMP 06/01/2013   CC: cough  Subjective:    Patient ID: Jamie Patterson, female    DOB: April 19, 1961, 54 y.o.   MRN: [id number]  HPI: Jamie Patterson is a 54 y.o. female presenting on 04/30/2015 for Cough   Not seen by myself since 2014.   4 wk h/o cough. Started with URI sxs, laryngitis. Initial fever, congestion and PNdrainage, ST. Most sxs have resolved but cough has remained. Denies dyspnea or wheezing. Cough mildly productive of mucous. Cough worse at night time, keeping her up. + coughing fits. Sleeping in recliner. + diaphoresis with cough. Initially with L maxillary pain/pressure, now resolved  Has tried dayquil, nyquil, robitussin, tussin. Nothing has helped. No sick contacts at home. No smokers at home. No h/o asthma or COPD  LMP >1 yr ago. On [employer] weight loss diet plan - lost 27 lbs. No longer needs antihypertensives.  Relevant past medical, surgical, family and social history reviewed and updated as indicated. Interim medical history since our last visit reviewed. Allergies and medications reviewed and updated. Current Outpatient Prescriptions on File Prior to Visit  Medication Sig  . acetaminophen (TYLENOL) 500 MG tablet Take 500 mg by mouth as needed.   Marland Kitchen aspirin EC 81 MG tablet Take 81 mg by mouth every evening.    . calcium-vitamin D (OSCAL WITH D) 500-200 MG-UNIT per tablet Take 1 tablet by mouth every evening.    . DULoxetine (CYMBALTA) 60 MG capsule Take 60 mg by mouth 2 (two) times daily.   Marland Kitchen gabapentin (NEURONTIN) 300 MG capsule Take 300 mg by mouth 4 (four) times daily. 0ne po bid  . lamoTRIgine (LAMICTAL) 150 MG tablet Take 1 tablet (150 mg total) by mouth 2 (two) times daily.  . Melatonin 3 MG TABS Take 10 mg by mouth at bedtime.   . Multiple Vitamin (MULTIVITAMIN) tablet Take 1 tablet by mouth daily.  . naproxen sodium (ANAPROX) 220 MG tablet Take 440 mg by mouth as  needed.   . primidone (MYSOLINE) 50 MG tablet Take 1 tablet (50 mg total) by mouth at bedtime.  . propranolol (INDERAL) 40 MG tablet Take 1 tablet (40 mg total) by mouth daily.  . vitamin B-12 (CYANOCOBALAMIN) 1000 MCG tablet Take 500 mcg by mouth daily.    No current facility-administered medications on file prior to visit.    Review of Systems Per HPI unless specifically indicated above     Objective:    BP 126/84 mmHg  Pulse 76  Temp(Src) 98.2 F (36.8 C) (Oral)  Wt 193 lb (87.544 kg)  SpO2 97%  LMP 06/01/2013  Wt Readings from Last 3 Encounters:  04/30/15 193 lb (87.544 kg)  03/21/15 206 lb (93.441 kg)  12/19/14 213 lb (96.616 kg)   Body mass index is 30.22 kg/(m^2).  Physical Exam  Constitutional: She appears well-developed and well-nourished. No distress.  HENT:  Head: Normocephalic and atraumatic.  Right Ear: Hearing, tympanic membrane, external ear and ear canal normal.  Left Ear: Hearing, tympanic membrane, external ear and ear canal normal.  Nose: No mucosal edema or rhinorrhea. Right sinus exhibits no maxillary sinus tenderness and no frontal sinus tenderness. Left sinus exhibits maxillary sinus tenderness (mild). Left sinus exhibits no frontal sinus tenderness.  Mouth/Throat: Uvula is midline, oropharynx is clear and moist and mucous membranes are normal. No oropharyngeal exudate, posterior oropharyngeal edema, posterior  oropharyngeal erythema or tonsillar abscesses.  Eyes: Conjunctivae and EOM are normal. Pupils are equal, round, and reactive to light. No scleral icterus.  Neck: Normal range of motion. Neck supple.  Cardiovascular: Normal rate, regular rhythm, normal heart sounds and intact distal pulses.   No murmur heard. Pulmonary/Chest: Effort normal and breath sounds normal. No respiratory distress. She has no wheezes. She has no rales.  Lymphadenopathy:    She has no cervical adenopathy.  Skin: Skin is warm and dry. No rash noted.  Nursing note and vitals  reviewed.     Assessment & Plan:  Pt overdue for CPE, but prefers female provider. Last saw OBGYN. Agrees to schedule upcoming CPE with Mayra Reel. I touched base with Jae Dire regarding patient.  Problem List Items Addressed This Visit    Obesity, Class I, BMI 30-34.9    Congratulated on weight loss to date, discussed recent healthy diet changes. Pt motivated to continue      Acute bronchitis - Primary    Given duration and progression of sxs will treat for acute bacterial bronchitis, cover for atypicals with zpack. cheratussin for night time cough. NSAID for bronchial inflammation. See pt instructions.      RESOLVED: HTN (hypertension)    Resolved with weight loss.          Follow up plan: Return if symptoms worsen or fail to improve.

## 2015-04-30 NOTE — Assessment & Plan Note (Signed)
Congratulated on weight loss to date, discussed recent healthy diet changes. Pt motivated to continue

## 2015-05-04 ENCOUNTER — Encounter: Payer: Self-pay | Admitting: Primary Care

## 2015-05-04 ENCOUNTER — Other Ambulatory Visit (HOSPITAL_COMMUNITY)
Admission: RE | Admit: 2015-05-04 | Discharge: 2015-05-04 | Disposition: A | Discharge status: Home / Self Care | Payer: Medicare PPO | Source: Ambulatory Visit | Attending: Primary Care | Admitting: Primary Care

## 2015-05-04 ENCOUNTER — Ambulatory Visit (INDEPENDENT_AMBULATORY_CARE_PROVIDER_SITE_OTHER): Payer: Medicare PPO | Admitting: Primary Care

## 2015-05-04 VITALS — BP 112/72 | HR 72 | Temp 97.6°F | Ht 67.0 in | Wt 191.8 lb

## 2015-05-04 DIAGNOSIS — Z124 Encounter for screening for malignant neoplasm of cervix: Secondary | ICD-10-CM | POA: Diagnosis not present

## 2015-05-04 DIAGNOSIS — Z23 Encounter for immunization: Secondary | ICD-10-CM | POA: Diagnosis not present

## 2015-05-04 DIAGNOSIS — E7800 Pure hypercholesterolemia, unspecified: Secondary | ICD-10-CM

## 2015-05-04 DIAGNOSIS — Z862 Personal history of diseases of the blood and blood-forming organs and certain disorders involving the immune mechanism: Secondary | ICD-10-CM

## 2015-05-04 DIAGNOSIS — Z0000 Encounter for general adult medical examination without abnormal findings: Secondary | ICD-10-CM | POA: Diagnosis not present

## 2015-05-04 DIAGNOSIS — Z1151 Encounter for screening for human papillomavirus (HPV): Secondary | ICD-10-CM | POA: Insufficient documentation

## 2015-05-04 DIAGNOSIS — Z1211 Encounter for screening for malignant neoplasm of colon: Secondary | ICD-10-CM

## 2015-05-04 LAB — COMPREHENSIVE METABOLIC PANEL
ALBUMIN: 4.4 g/dL (ref 3.5–5.2)
ALT: 15 U/L (ref 0–35)
AST: 27 U/L (ref 0–37)
Alkaline Phosphatase: 91 U/L (ref 39–117)
BUN: 11 mg/dL (ref 6–23)
CO2: 28 mEq/L (ref 19–32)
CREATININE: 0.76 mg/dL (ref 0.40–1.20)
Calcium: 9.9 mg/dL (ref 8.4–10.5)
Chloride: 99 mEq/L (ref 96–112)
GFR: 84.49 mL/min (ref >60.00)
Glucose, Bld: 98 mg/dL (ref 70–99)
Potassium: 4.6 mEq/L (ref 3.5–5.1)
Sodium: 133 mEq/L — ABNORMAL LOW (ref 135–145)
Total Bilirubin: 0.4 mg/dL (ref 0.2–1.2)
Total Protein: 6.8 g/dL (ref 6.0–8.3)

## 2015-05-04 LAB — LIPID PANEL
CHOLESTEROL: 213 mg/dL — AB (ref 0–200)
HDL: 55.7 mg/dL (ref >39.00)
LDL Cholesterol: 142 mg/dL — ABNORMAL HIGH (ref 0–99)
NonHDL: 156.99
TRIGLYCERIDES: 73 mg/dL (ref 0.0–149.0)
Total CHOL/HDL Ratio: 4
VLDL: 14.6 mg/dL (ref 0.0–40.0)

## 2015-05-04 LAB — CBC
HCT: 37.8 % (ref 36.0–46.0)
Hemoglobin: 12.5 g/dL (ref 12.0–15.0)
MCHC: 33.1 g/dL (ref 30.0–36.0)
MCV: 86.4 fl (ref 78.0–100.0)
Platelets: 289 10*3/uL (ref 150.0–400.0)
RBC: 4.38 Mil/uL (ref 3.87–5.11)
RDW: 16.5 % — AB (ref 11.5–15.5)
WBC: 5.8 10*3/uL (ref 4.0–10.5)

## 2015-05-04 NOTE — Progress Notes (Signed)
Pre visit review using our clinic review tool, if applicable. No additional management support is needed unless otherwise documented below in the visit note. 

## 2015-05-04 NOTE — Progress Notes (Signed)
Subjective:    Patient ID: Jamie Patterson, female    DOB: May 09, 1961, 54 y.o.   MRN: [id number]  HPI  Jamie Patterson is a 54 year old female who presents today for complete physical.  Immunizations: -Tetanus: Completed in 2013 -Influenza: Has not completed this season, will provide. -Pneumonia: Would like today.    Diet: Endorses a healthy diet which consists of: Breakfast: egg and toast Lunch: chicken, veggies, fruit. Dinner: chicken, veggies, fruit. Snack: Berries, veggies. Beverages: Water, coffee with stevia. Exercise: She is currently not exercising. Eye exam: Completes annually Dental exam: Completes semi-annually. Colonoscopy: Never completed, will schedule today. Pap Smear: Completed over 4 years ago, due today. Mammogram: Completed over 4 years ago   Review of Systems  Respiratory: Positive for cough. Negative for shortness of breath.   Cardiovascular: Negative for chest pain.  Gastrointestinal: Negative for diarrhea and constipation.       Takes herbal supplement for bowel movements  Genitourinary: Negative for difficulty urinating.  Musculoskeletal: Negative for myalgias and arthralgias.  Skin: Negative for rash.  Neurological: Negative for numbness and headaches.       Occasional dizziness with position changes, currently following with neurologist.  Psychiatric/Behavioral:       Currently being treated, follows with psychiatry.        Past Medical History  Diagnosis Date  . HLD (hyperlipidemia)   . Depression     followed by psych  . Chronic pain due to injury     at R ankle due to ankle fracture  . Imbalance     likely multifactorial (s/p balance training at [facility] 08/2010) ?CMT  . Right ankle pain 2003    trimalleolar fx s/p fusion  . History of hypertension   . Iron deficiency anemia   . Seizure     focal, ex vacuo ventriculomegaly?, pending neuro w/u with sleep deprived EEG  . Benign essential tremor     worsened by anxiety  . Amblyopia     Social  History   Social History  . Marital Status: Divorced    Spouse Name: N/A  . Number of Children: 1  . Years of Education: N/A   Occupational History  . Disabled     depression  . DISABILITY    Social History Main Topics  . Smoking status: Former Smoker -- 1.00 packs/day for 20 years    Quit date: 08/12/1995  . Smokeless tobacco: Never Used  . Alcohol Use: No     Comment: None for > 1 year  . Drug Use: No  . Sexual Activity: Not on file   Other Topics Concern  . Not on file   Social History Narrative   No caffeine. Divorced. 1 child. 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 Ankle fusion  02/2004    Right-50% disability   . Orif ankle fracture  06/2003    Right  . Cesarean section    . Hospitalization  08/2011    syncope thought 2/2 seizure, MRI - Hyperintensity in the cerebral white matter bilaterally is nonspecific, started on Keppra    Family History  Problem Relation Age of Onset  . Atrial fibrillation Father   . Hypertension Father   . Osteoarthritis Father   . Aortic aneurysm Father   . Depression Mother   . Hepatitis Brother  Hep. C  . Anemia Sister   . Arrhythmia Mother 49    Dx with VTach after having sudden collapse, did NOT have h/o AMI's     No Known Allergies  Current Outpatient Prescriptions on File Prior to Visit  Medication Sig Dispense Refill  . acetaminophen (TYLENOL) 500 MG tablet Take 500 mg by mouth as needed.     Marland Kitchen aspirin EC 81 MG tablet Take 81 mg by mouth every evening.      Marland Kitchen azithromycin (ZITHROMAX) 250 MG tablet Take two tablets on day one followed by one tablet on days 2-5 6 each 0  . calcium-vitamin D (OSCAL WITH D) 500-200 MG-UNIT per tablet Take 1 tablet by mouth every evening.      . DULoxetine (CYMBALTA) 60 MG capsule Take 60 mg by mouth 2 (two) times daily.     Marland Kitchen  gabapentin (NEURONTIN) 300 MG capsule Take 300 mg by mouth 4 (four) times daily. 0ne po bid    . guaiFENesin-codeine (ROBITUSSIN AC) 100-10 MG/5ML syrup Take 5 mLs by mouth at bedtime as needed. 160 mL 0  . lamoTRIgine (LAMICTAL) 150 MG tablet Take 1 tablet (150 mg total) by mouth 2 (two) times daily. 180 tablet 1  . Melatonin 3 MG TABS Take 10 mg by mouth at bedtime.     . Multiple Vitamin (MULTIVITAMIN) tablet Take 1 tablet by mouth daily.    . naproxen sodium (ANAPROX) 220 MG tablet Take 440 mg by mouth as needed.     . primidone (MYSOLINE) 50 MG tablet Take 1 tablet (50 mg total) by mouth at bedtime. 90 tablet 3  . propranolol (INDERAL) 40 MG tablet Take 1 tablet (40 mg total) by mouth daily. 90 tablet 3  . vitamin B-12 (CYANOCOBALAMIN) 1000 MCG tablet Take 500 mcg by mouth daily.      No current facility-administered medications on file prior to visit.    BP 112/72 mmHg  Pulse 72  Temp(Src) 97.6 F (36.4 C) (Oral)  Ht  (1.702 m)  Wt 191 lb 12.8 oz (87 kg)  BMI 30.03 kg/m2  SpO2 98%  LMP 06/01/2013     Objective:   Physical Exam  Constitutional: She is oriented to person, place, and time. She appears well-nourished.  HENT:  Right Ear: Tympanic membrane and ear canal normal.  Left Ear: Tympanic membrane and ear canal normal.  Nose: Nose normal.  Mouth/Throat: Oropharynx is clear and moist.  Eyes: Conjunctivae and EOM are normal. Pupils are equal, round, and reactive to light.  Neck: Neck supple. No thyromegaly present.  Cardiovascular: Normal rate.   Pulmonary/Chest: Effort normal and breath sounds normal. She has no rales. Right breast exhibits no mass, no skin change and no tenderness. Left breast exhibits no mass, no skin change and no tenderness.  Abdominal: Bowel sounds are normal. She exhibits no mass. There is no tenderness.  Genitourinary: Vagina normal. Cervix exhibits no motion tenderness and no discharge. Right adnexum displays no tenderness. Left adnexum  displays no tenderness. No vaginal discharge found.  Musculoskeletal: Normal range of motion.  Lymphadenopathy:    She has no cervical adenopathy.  Neurological: She is alert and oriented to person, place, and time. No cranial nerve deficit. Coordination normal.  Reflex Scores:      Patellar reflexes are 3+ on the right side and 3+ on the left side. Skin: Skin is warm and dry.  Psychiatric: She has a normal mood and affect.  Assessment & Plan:

## 2015-05-04 NOTE — Assessment & Plan Note (Signed)
Tetanus UTD. Influenza and pneumonia provided today. She will schedule mammogram, information provided. Pap and breast exam completed today. Discussed healthy diet and exercise, commended her on her recent weight loss. Referral made for screening colonoscopy. Exam unremarkable, labs pending (lipid panel, CMP). Follow up in 1 year for repeat physical.

## 2015-05-04 NOTE — Patient Instructions (Addendum)
Complete lab work prior to leaving today. I will notify you of your results.  We will be in contact regarding your pap results.  You've been provided with an influenza and pneumonia vaccination today.  You will be contacted regarding your referral to Gi for your colonoscopy.  Please let us know if you have not heard back within one week.   Continue your efforts towards weight loss. Start exercising if possible. You need 1 hour of moderate intensity exercise 5 days weekly.  It was a pleasure to see you today!

## 2015-05-08 LAB — CYTOLOGY - PAP

## 2015-06-21 ENCOUNTER — Encounter: Payer: Self-pay | Admitting: Neurology

## 2015-06-21 ENCOUNTER — Ambulatory Visit (INDEPENDENT_AMBULATORY_CARE_PROVIDER_SITE_OTHER): Payer: Medicare PPO | Admitting: Neurology

## 2015-06-21 VITALS — BP 104/72 | HR 72 | Ht 66.0 in | Wt 189.0 lb

## 2015-06-21 DIAGNOSIS — G43709 Chronic migraine without aura, not intractable, without status migrainosus: Secondary | ICD-10-CM | POA: Diagnosis not present

## 2015-06-21 DIAGNOSIS — G250 Essential tremor: Secondary | ICD-10-CM

## 2015-06-21 DIAGNOSIS — IMO0002 Reserved for concepts with insufficient information to code with codable children: Secondary | ICD-10-CM

## 2015-06-21 MED ORDER — METHYLPREDNISOLONE 4 MG PO TBPK: ORAL_TABLET | ORAL | Status: DC

## 2015-06-21 NOTE — Progress Notes (Signed)
Jamie Patterson was seen today in neurologic consultation at the request of [name], MD and [name].  Since [name] is no longer with the practice, I will be resuming the patient's care.  I have reviewed the patient's prior medical records.    The patient has had 2 episodes of what sound like seizure.  These consisted of a vocalization, falling and generalized shaking.  An MRI of the brain was done and revealed multiple T2 hyperintensities, with some concern for demyelinating disease.  She subsequently had a lumbar puncture that was negative for oligoclonal bands.  She was placed on Keppra in addition to the Lamictal that she was already on for depression.  She subsequently had a sleep deprived EEG that was negative and therefore her Keppra was weaned and her Lamictal was increased, in hopes that that could be used as monotherapy for potential seizure and depression.  She remains on Wellbutrin, but she did talk with her psychiatrist about this.  They felt that the benefits outweigh the risks.  She was also placed on Neurontin since last visit.  She has only been on it since Friday.  She was on this for anxiety.  She does not know if it is helpful yet.  The patient also has tremor and after talking with [name], the patients psychiatrist, [name] started the patient on propranolol 20 mg, 2 tablets times a day.  The patient reports that it has helped dramatically, at least 75% helpful.  She reports that tremor began after a w/d from oxycontin and valium (she went off of it "cold [place]" and did not realize that she was "addicted" and should wean it).  She may have had tremor before then but worse after that.  She could feel the tremor inside and that is much better. She is no longer having trouble with activities of daily living like she was. She still has some dizziness if she stands up quickly, bends over or turns quickly.  She has adjusted to this fairly well.  She continues to complain of chronic daily  headache.  The headaches are holocephalic.  She has daily nausea and she is not sure if that is from the headache from the multiple medications.  She does not have vomiting.  There has been no photophobia.  She does have a history of migraine.  06/14/13 update:  The patient is following up today regarding her essential tremor.  She is currently on propranolol 40 mg twice per day.  She continues to have some dizziness if she stands up quickly, bends over or turns.  She has had no seizures since our last visit.  She does remain on Wellbutrin, and understands that this lower seizure threshold.  She has talked about this with her psychiatrist and they think that the benefits outweigh risks.  She is very concerned about memory changes.  She is having word finding difficulty.  She admits to continued difficulty with uncontrolled depression, but states that she is not suicidal or homicidal.  12/19/13 update:  The patient is following up on essential tremor.  She is currently on Mysoline, and last visit we tried to stop the propranolol because of dizziness.  Unfortunately, tremor increased and we had to restart the propranolol but restarted it at daily dosing instead of twice a day dosing.  She is no longer dizzy and the tremor is better.  She has some "jerking" and twitching spells; is just one single jerk of an arm or leg.  She is on gabapentin, 300 mg, 1 in the AM and 2 at night.  She is on that for psychiatric reasons.  Not sure it is helping.  Is seeing psychiatry but insurance will no longer pay for counseling, and she cannot afford it, although she thought it was beneficial.  She also has a history of seizure and is on Lamictal.  Unfortunately, she remains on Wellbutrin for depression.  She has been seizure free, however.  She still complains of memory issues.  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 She was supposed to have neuropsych testing completed before this  visit but it was never scheduled.    06/21/14 update:  Pt returns today for follow-up regarding tremor.  She is on a combination of low-dose propranolol, 40 mg daily, and primidone 50 mg nightly.  Last visit, the patient was describing what sounded like myoclonus.  I thought that perhaps it was secondary to her Neurontin, which is being prescribed by psychiatry.  It was decreased and the jerking went away.  She went off of the Wellbutrin, changed to cymbalta and increased it a week ago.  She is still depressed and asks about recommendations for counselors.  She had great luck with Dr. Lendell Caprice but it was just too far to travel for routine f/u.  She did have neuropsych testing with Dr. Lendell Caprice that demonstrated major depression, social phobia.  All memory testing was normal and no evidence of amnestic memory d/o.  No evidence of neurodegenerative process.  12/19/14 update:  Pt returns for follow up.  She is on propranolol 40 mg and propranolol 50 q hs.  Tremor has perhaps increased some, but she thinks maybe that is due to increased anxiety.  She really does not want to change her medications.  She does have a hx of depression.  Dr. [name] just doubled her cymbalta to 60 mg and her prozac was d/c.  It has only been 2 weeks.  She admits that her anxiety and depression have been fairly disabling.  She is not suicidal or homicidal.  She has not even been back to her primary care physician in over a year and a half.  She likes him but overall would rather have a female provider because of things that she has experienced in the past, and because she would rather have 1 provider who did her female exams.  She asks me about this today.  She d/c her BP med and her cholesterol med because she knew she had to go back to the dr for a refill and she was anxious about that.  She still does not have a Veterinary surgeon.  06/21/15 update:  The patient returns today for follow-up.  She has a history of essential tremor and is on  propranolol, 40 mg and primidone, 50 mg daily.  Tremor may be a little worse but "not bad like it was."  She reports that she has had more headaches; has a long hx of such.  Always R sided.  Is same headache she reports she saw Dr. Scarlette Shorts years ago.  She states that is pounding in nature.  No n/v.  She has photophobia but no phonophobia.  She takes aleve or advil.  She has noted that she has had to take that 4-5 days/week over the last few weeks.  States that it has been increased over the last month or so.  She continues to see Dr. [name] for her depression.  PREVIOUS MEDICATIONS: n/a  All:  No Known Allergies  Current Outpatient Prescriptions on File Prior to Visit  Medication Sig Dispense Refill  . acetaminophen (TYLENOL) 500 MG tablet Take 500 mg by mouth as needed.     Marland Kitchen aspirin EC 81 MG tablet Take 81 mg by mouth every evening.      . calcium-vitamin D (OSCAL WITH D) 500-200 MG-UNIT per tablet Take 1 tablet by mouth every evening.      . DULoxetine (CYMBALTA) 60 MG capsule Take 60 mg by mouth 2 (two) times daily.     Marland Kitchen gabapentin (NEURONTIN) 300 MG capsule Take 300 mg by mouth 2 (two) times daily.     Marland Kitchen lamoTRIgine (LAMICTAL) 150 MG tablet Take 1 tablet (150 mg total) by mouth 2 (two) times daily. 180 tablet 1  . Melatonin 3 MG TABS Take 10 mg by mouth at bedtime.     . Multiple Vitamin (MULTIVITAMIN) tablet Take 1 tablet by mouth daily.    . naproxen sodium (ANAPROX) 220 MG tablet Take 440 mg by mouth as needed.     . primidone (MYSOLINE) 50 MG tablet Take 1 tablet (50 mg total) by mouth at bedtime. 90 tablet 3  . propranolol (INDERAL) 40 MG tablet Take 1 tablet (40 mg total) by mouth daily. 90 tablet 3  . vitamin B-12 (CYANOCOBALAMIN) 1000 MCG tablet Take 500 mcg by mouth daily.      No current facility-administered medications on file prior to visit.   Past Medical History  Diagnosis Date  . HLD (hyperlipidemia)   . Depression     followed by psych  . Chronic pain due to injury      at R ankle due to ankle fracture  . Imbalance     likely multifactorial (s/p balance training at [facility] 08/2010) ?CMT  . Right ankle pain 2003    trimalleolar fx s/p fusion  . History of hypertension   . Iron deficiency anemia   . Seizure     focal, ex vacuo ventriculomegaly?, pending neuro w/u with sleep deprived EEG  . Benign essential tremor     worsened by anxiety  . Amblyopia    Past Surgical History  Procedure Laterality Date  . Ankle fusion  02/2004    Right-50% disability   . Orif ankle fracture  06/2003    Right  . Cesarean section    . Hospitalization  08/2011    syncope thought 2/2 seizure, MRI - Hyperintensity in the cerebral white matter bilaterally is nonspecific, started on Keppra   Social History   Social History  . Marital Status: Divorced    Spouse Name: N/A  . Number of Children: 1  . Years of Education: N/A   Occupational History  . Disabled     depression  . DISABILITY    Social History Main Topics  . Smoking status: Former Smoker -- 1.00 packs/day for 20 years    Quit date: 08/12/1995  . Smokeless tobacco: Never Used  . Alcohol Use: No     Comment: None for > 1 year  . Drug Use: No  . Sexual Activity: Not on file   Other Topics Concern  . Not on file   Social History Narrative   No caffeine. Divorced. 1 child. 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, although endorses that she falls a lot     ROS:  A complete 10 system review of systems was  obtained and was unremarkable apart from what is mentioned above.  PHYSICAL EXAMINATION:    Filed Vitals:   06/21/15 1037  BP: 104/72  Pulse: 72    GEN:  Normal appears female in no acute distress.  Appears stated age.  Appears somewhat anxious and teary. HEENT:  Normocephalic, atraumatic. The mucous membranes are moist. The superficial temporal arteries are without ropiness or tenderness.  No occipital notch tenderness CV:   RRR Lungs:  CTAB Neck:  No bruits   NEUROLOGICAL: Orientation:  The patient is alert and oriented x 3.   Cranial nerves: There is good facial symmetry.  Extraocular muscles are intact and visual fields are full to confrontational testing. Speech is fluent and clear. Soft palate rises symmetrically and there is no tongue deviation. Hearing is intact to conversational tone. Tone: Tone is good throughout. Sensation: Sensation is intact to light touch throughout (facial, trunk, extremities). Coordination:  The patient has no difficulty with RAM's or FNF bilaterally.   Abnormal Movements:  There is some tremor of the outstretched hands, left more than right Motor: Strength is 5/5 in the bilateral upper and lower extremities.  Shoulder shrug is equal and symmetric. There is no pronator drift.  There are no fasciculations noted. Gait and Station: The patient is steady  Labs:   Wt Readings from Last 3 Encounters:  06/21/15 189 lb (85.73 kg)  05/04/15 191 lb 12.8 oz (87 kg)  04/30/15 193 lb (87.544 kg)   Lab Results  Component Value Date   VITAMINB12 777 01/06/2012   Lab Results  Component Value Date   TSH 4.33 09/13/2013     Chemistry      Component Value Date/Time   NA 133* 05/04/2015 1111   K 4.6 05/04/2015 1111   CL 99 05/04/2015 1111   CO2 28 05/04/2015 1111   BUN 11 05/04/2015 1111   CREATININE 0.76 05/04/2015 1111      Component Value Date/Time   CALCIUM 9.9 05/04/2015 1111   ALKPHOS 91 05/04/2015 1111   AST 27 05/04/2015 1111   ALT 15 05/04/2015 1111   BILITOT 0.4 05/04/2015 1111        IMPRESSION:  1. Essential Tremor, likely exacerbated by significant anxiety/depression. 2.  Possible seizure.  EEG has been normal, but clinically it sounds as if the patient had a seizure.  We are currently optimizing her antidepressant (Lamictal) for seizure control.  She is off of wellbutrin for depression 3.  Chronic migraine/chronic daily headache.  This has recently gotten  worse.  Wanting to hold on prophylaxis 4.  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  Neuropsych testing was negative in this regard, demonstrating only major depression 5.  ? Myoclonus - resolved with decreased gabapentin dose   PLAN:  1. She seems to be at a good balance with propranolol, 40 mg once daily and primidone 50 mg once daily.  2.  Try medrol to see if will break headache cycle.  Risks, benefits, side effects and alternative therapies were discussed.  The opportunity to ask questions was given and they were answered to the best of my ability.  The patient expressed understanding and willingness to follow the outlined treatment protocols. 3.  Continue with [name].  Long discussion about mental health and the importance of physical exercise in mental health.   4.  F/u 4-6 months.  Stressed that she needed to call me if the medrol didn't break her current headache cycle.  Much greater than 50% of this  visit was spent in counseling with the patient and the family.  Total face to face time:  25 min

## 2015-07-02 ENCOUNTER — Other Ambulatory Visit: Payer: Self-pay | Admitting: Neurology

## 2015-07-02 MED ORDER — LAMOTRIGINE 150 MG PO TABS: 150.0000 mg | ORAL_TABLET | Freq: Two times a day (BID) | ORAL | Status: DC

## 2015-07-02 NOTE — Telephone Encounter (Signed)
Lamictal refill requested. Per last office note- patient to remain on medication. Refill approved and sent to patient's pharmacy.   

## 2015-07-04 ENCOUNTER — Ambulatory Visit: Payer: Medicare PPO | Admitting: Family Medicine

## 2015-10-19 ENCOUNTER — Encounter: Payer: Self-pay | Admitting: Neurology

## 2015-10-19 ENCOUNTER — Ambulatory Visit (INDEPENDENT_AMBULATORY_CARE_PROVIDER_SITE_OTHER): Payer: Medicare Other | Admitting: Neurology

## 2015-10-19 VITALS — BP 118/84 | HR 65 | Ht 67.0 in | Wt 192.0 lb

## 2015-10-19 DIAGNOSIS — G250 Essential tremor: Secondary | ICD-10-CM

## 2015-10-19 DIAGNOSIS — G43709 Chronic migraine without aura, not intractable, without status migrainosus: Secondary | ICD-10-CM | POA: Diagnosis not present

## 2015-10-19 DIAGNOSIS — F411 Generalized anxiety disorder: Secondary | ICD-10-CM | POA: Diagnosis not present

## 2015-10-19 DIAGNOSIS — IMO0002 Reserved for concepts with insufficient information to code with codable children: Secondary | ICD-10-CM

## 2015-10-19 NOTE — Progress Notes (Signed)
Jamie Patterson was seen today in neurologic consultation at the request of [name], MD and [name].  Since [name] is no longer with the practice, I will be resuming the patient's care.  I have reviewed the patient's prior medical records.    The patient has had 2 episodes of what sound like seizure.  These consisted of a vocalization, falling and generalized shaking.  An MRI of the brain was done and revealed multiple T2 hyperintensities, with some concern for demyelinating disease.  She subsequently had a lumbar puncture that was negative for oligoclonal bands.  She was placed on Keppra in addition to the Lamictal that she was already on for depression.  She subsequently had a sleep deprived EEG that was negative and therefore her Keppra was weaned and her Lamictal was increased, in hopes that that could be used as monotherapy for potential seizure and depression.  She remains on Wellbutrin, but she did talk with her psychiatrist about this.  They felt that the benefits outweigh the risks.  She was also placed on Neurontin since last visit.  She has only been on it since Friday.  She was on this for anxiety.  She does not know if it is helpful yet.  The patient also has tremor and after talking with [name], the patients psychiatrist, [name] started the patient on propranolol 20 mg, 2 tablets times a day.  The patient reports that it has helped dramatically, at least 75% helpful.  She reports that tremor began after a w/d from oxycontin and valium (she went off of it "cold [place]" and did not realize that she was "addicted" and should wean it).  She may have had tremor before then but worse after that.  She could feel the tremor inside and that is much better. She is no longer having trouble with activities of daily living like she was. She still has some dizziness if she stands up quickly, bends over or turns quickly.  She has adjusted to this fairly well.  She continues to complain of chronic daily  headache.  The headaches are holocephalic.  She has daily nausea and she is not sure if that is from the headache from the multiple medications.  She does not have vomiting.  There has been no photophobia.  She does have a history of migraine.  06/14/13 update:  The patient is following up today regarding her essential tremor.  She is currently on propranolol 40 mg twice per day.  She continues to have some dizziness if she stands up quickly, bends over or turns.  She has had no seizures since our last visit.  She does remain on Wellbutrin, and understands that this lower seizure threshold.  She has talked about this with her psychiatrist and they think that the benefits outweigh risks.  She is very concerned about memory changes.  She is having word finding difficulty.  She admits to continued difficulty with uncontrolled depression, but states that she is not suicidal or homicidal.  12/19/13 update:  The patient is following up on essential tremor.  She is currently on Mysoline, and last visit we tried to stop the propranolol because of dizziness.  Unfortunately, tremor increased and we had to restart the propranolol but restarted it at daily dosing instead of twice a day dosing.  She is no longer dizzy and the tremor is better.  She has some "jerking" and twitching spells; is just one single jerk of an arm or leg.  She is on gabapentin, 300 mg, 1 in the AM and 2 at night.  She is on that for psychiatric reasons.  Not sure it is helping.  Is seeing psychiatry but insurance will no longer pay for counseling, and she cannot afford it, although she thought it was beneficial.  She also has a history of seizure and is on Lamictal.  Unfortunately, she remains on Wellbutrin for depression.  She has been seizure free, however.  She still complains of memory issues.  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 She was supposed to have neuropsych testing completed before this  visit but it was never scheduled.    06/21/14 update:  Pt returns today for follow-up regarding tremor.  She is on a combination of low-dose propranolol, 40 mg daily, and primidone 50 mg nightly.  Last visit, the patient was describing what sounded like myoclonus.  I thought that perhaps it was secondary to her Neurontin, which is being prescribed by psychiatry.  It was decreased and the jerking went away.  She went off of the Wellbutrin, changed to cymbalta and increased it a week ago.  She is still depressed and asks about recommendations for counselors.  She had great luck with Dr. Lendell CapriceSullivan but it was just too far to travel for routine f/u.  She did have neuropsych testing with Dr. Lendell CapriceSullivan that demonstrated major depression, social phobia.  All memory testing was normal and no evidence of amnestic memory d/o.  No evidence of neurodegenerative process.  12/19/14 update:  Pt returns for follow up.  She is on propranolol 40 mg and propranolol 50 q hs.  Tremor has perhaps increased some, but she thinks maybe that is due to increased anxiety.  She really does not want to change her medications.  She does have a hx of depression.  [name] just doubled her cymbalta to 60 mg and her prozac was d/c.  It has only been 2 weeks.  She admits that her anxiety and depression have been fairly disabling.  She is not suicidal or homicidal.  She has not even been back to her primary care physician in over a year and a half.  She likes him but overall would rather have a female provider because of things that she has experienced in the past, and because she would rather have 1 provider who did her female exams.  She asks me about this today.  She d/c her BP med and her cholesterol med because she knew she had to go back to the dr for a refill and she was anxious about that.  She still does not have a Veterinary surgeoncounselor.  06/21/15 update:  The patient returns today for follow-up.  She has a history of essential tremor and is on  propranolol, 40 mg and primidone, 50 mg daily.  Tremor may be a little worse but "not bad like it was."  She reports that she has had more headaches; has a long hx of such.  Always R sided.  Is same headache she reports she saw Dr. Scarlette ShortsAdleman years ago.  She states that is pounding in nature.  No n/v.  She has photophobia but no phonophobia.  She takes aleve or advil.  She has noted that she has had to take that 4-5 days/week over the last few weeks.  States that it has been increased over the last month or so.  She continues to see [name] for her depression.  10/19/15  update:  The patient returns today for follow-up.  She has a history of essential tremor and is on propranolol, 40 mg and primidone, 50 mg daily.  She continues to see Dr. [name] for her depression.  Last visit, she was complaining about increased headache, but she did refuse a daily prophylactic medication even though she likely needed one.  I did give her a course of Medrol to see if I could break up the acute overlying headache that she was having.  She does state that the headaches had gone away for a while but are back again over the last month and they aren't as bad as it was.  She states that it made her feel anxious and just "weird."   She still doesn't want a preventative.  She states that these may be more stress as are in the bilateral frontal region and worse in the evening.  She is taking excessive advil on a daily basis.   She has had lexapro added to her lamictal and cymbalta for depression.  She is also on gabapentin but is only on per day.     PREVIOUS MEDICATIONS: n/a  All:  No Known Allergies  Current Outpatient Prescriptions on File Prior to Visit  Medication Sig Dispense Refill  . acetaminophen (TYLENOL) 500 MG tablet Take 500 mg by mouth as needed.     Marland Kitchen aspirin EC 81 MG tablet Take 81 mg by mouth every evening.      . calcium-vitamin D (OSCAL WITH D) 500-200 MG-UNIT per tablet Take 1 tablet by mouth every evening.       . DULoxetine (CYMBALTA) 60 MG capsule Take 60 mg by mouth 2 (two) times daily.     Marland Kitchen gabapentin (NEURONTIN) 300 MG capsule Take 600 mg by mouth 2 (two) times daily.     Marland Kitchen lamoTRIgine (LAMICTAL) 150 MG tablet Take 1 tablet (150 mg total) by mouth 2 (two) times daily. 180 tablet 1  . Melatonin 3 MG TABS Take 10 mg by mouth at bedtime.     . Multiple Vitamin (MULTIVITAMIN) tablet Take 1 tablet by mouth daily.    . naproxen sodium (ANAPROX) 220 MG tablet Take 440 mg by mouth as needed.     . primidone (MYSOLINE) 50 MG tablet Take 1 tablet (50 mg total) by mouth at bedtime. 90 tablet 3  . propranolol (INDERAL) 40 MG tablet Take 1 tablet (40 mg total) by mouth daily. 90 tablet 3  . vitamin B-12 (CYANOCOBALAMIN) 1000 MCG tablet Take 500 mcg by mouth daily.      No current facility-administered medications on file prior to visit.   Past Medical History  Diagnosis Date  . HLD (hyperlipidemia)   . Depression     followed by psych  . Chronic pain due to injury     at R ankle due to ankle fracture  . Imbalance     likely multifactorial (s/p balance training at [facility] 08/2010) ?CMT  . Right ankle pain 2003    trimalleolar fx s/p fusion  . History of hypertension   . Iron deficiency anemia   . Seizure (HCC)     focal, ex vacuo ventriculomegaly?, pending neuro w/u with sleep deprived EEG  . Benign essential tremor     worsened by anxiety  . Amblyopia    Past Surgical History  Procedure Laterality Date  . Ankle fusion  02/2004    Right-50% disability   . Orif ankle fracture  06/2003    Right  .  Cesarean section    . Hospitalization  08/2011    syncope thought 2/2 seizure, MRI - Hyperintensity in the cerebral white matter bilaterally is nonspecific, started on Keppra   Social History   Social History  . Marital Status: Divorced    Spouse Name: N/A  . Number of Children: 1  . Years of Education: N/A   Occupational History  . Disabled     depression  . DISABILITY    Social History  Main Topics  . Smoking status: Former Smoker -- 1.00 packs/day for 20 years    Quit date: 08/12/1995  . Smokeless tobacco: Never Used  . Alcohol Use: No     Comment: None for > 1 year  . Drug Use: No  . Sexual Activity: Not on file   Other Topics Concern  . Not on file   Social History Narrative   No caffeine. Divorced. 1 child. 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, although endorses that she falls a lot     ROS:  A complete 10 system review of systems was obtained and was unremarkable apart from what is mentioned above.  PHYSICAL EXAMINATION:    Filed Vitals:   10/19/15 1109  BP: 118/84  Pulse: 65    GEN:  Normal appears female in no acute distress.  Appears stated age.  Appears somewhat anxious and teary. HEENT:  Normocephalic, atraumatic. The mucous membranes are moist. The superficial temporal arteries are without ropiness or tenderness.  No occipital notch tenderness CV:  RRR Lungs:  CTAB Neck:  No bruits   NEUROLOGICAL: Orientation:  The patient is alert and oriented x 3.   Cranial nerves: There is good facial symmetry.  Extraocular muscles are intact and visual fields are full to confrontational testing. Speech is fluent and clear. Soft palate rises symmetrically and there is no tongue deviation. Hearing is intact to conversational tone. Tone: Tone is good throughout. Sensation: Sensation is intact to light touch throughout (facial, trunk, extremities). Coordination:  The patient has no difficulty with RAM's or FNF bilaterally.   Abnormal Movements:  There is some minimal tremor of the outstretched hands, left more than right.  She has only minimal trouble with archimedes spirals Motor: Strength is 5/5 in the bilateral upper and lower extremities.  Shoulder shrug is equal and symmetric. There is no pronator drift.  There are no fasciculations noted. Gait and Station: The patient is  steady  Labs:   Wt Readings from Last 3 Encounters:  10/19/15 192 lb (87.091 kg)  06/21/15 189 lb (85.73 kg)  05/04/15 191 lb 12.8 oz (87 kg)   Lab Results  Component Value Date   VITAMINB12 777 01/06/2012   Lab Results  Component Value Date   TSH 4.33 09/13/2013     Chemistry      Component Value Date/Time   NA 133* 05/04/2015 1111   K 4.6 05/04/2015 1111   CL 99 05/04/2015 1111   CO2 28 05/04/2015 1111   BUN 11 05/04/2015 1111   CREATININE 0.76 05/04/2015 1111      Component Value Date/Time   CALCIUM 9.9 05/04/2015 1111   ALKPHOS 91 05/04/2015 1111   AST 27 05/04/2015 1111   ALT 15 05/04/2015 1111   BILITOT 0.4 05/04/2015 1111        IMPRESSION:  1. Essential Tremor, likely exacerbated by significant anxiety/depression. 2.  Possible seizure.  EEG has been normal, but clinically it sounds as  if the patient had a seizure.  We are currently optimizing her antidepressant (Lamictal) for seizure control.  She is off of wellbutrin for depression 3.  Chronic migraine/chronic daily headache.  This has recently gotten worse but think that recent headache is tension and stress related.  Also concerned about rebound.  Wanting to hold on prophylaxis 4.  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  Neuropsych testing was negative in this regard, demonstrating only major depression 5.  ? Myoclonus - resolved with decreased gabapentin dose   PLAN:  1. She seems to be at a good balance with propranolol, 40 mg once daily and primidone 50 mg once daily.  2.  Discussed rebound phenomenon.  Limit abortive to no more than 2-3 days per week.   3.  Continue with Dr. [name]. Talked to her about counseling.  Both Dr. [name] and I discussed to her about seeing [facility] 4.  F/u 4-6 months.  Much greater than 50% of this visit was spent in counseling with the patient and the family.  Total face to face time:  25 min

## 2015-12-27 ENCOUNTER — Other Ambulatory Visit: Payer: Self-pay | Admitting: Neurology

## 2015-12-27 NOTE — Telephone Encounter (Signed)
Patient states she has always been prescribed this by Dr Tat. Refill sent to pharmacy.   She also wanted to make us aware she saw Cristin Saffo and [facility] because she focused more on her area of need. She will call with any other questions.

## 2015-12-27 NOTE — Telephone Encounter (Signed)
Per my 06/2015, was using for depression and apparently using for possible seizure.  She does have psychiatrist that I think may be managing this.  Ask her about that ([name] is psych) before I RX

## 2015-12-27 NOTE — Telephone Encounter (Signed)
Looks like this was last prescribed by you, but not mentioned in last note summary. Do you prescribe this medication?

## 2016-01-17 ENCOUNTER — Encounter: Payer: Self-pay | Admitting: Primary Care

## 2016-01-17 ENCOUNTER — Ambulatory Visit (INDEPENDENT_AMBULATORY_CARE_PROVIDER_SITE_OTHER): Payer: Medicare Other | Admitting: Primary Care

## 2016-01-17 VITALS — BP 120/80 | HR 83 | Temp 98.0°F | Ht 67.0 in | Wt 192.8 lb

## 2016-01-17 DIAGNOSIS — R059 Cough, unspecified: Secondary | ICD-10-CM

## 2016-01-17 DIAGNOSIS — R05 Cough: Secondary | ICD-10-CM | POA: Diagnosis not present

## 2016-01-17 MED ORDER — AZITHROMYCIN 250 MG PO TABS: ORAL_TABLET | ORAL | Status: DC

## 2016-01-17 MED ORDER — HYDROCODONE-HOMATROPINE 5-1.5 MG/5ML PO SYRP: 5.0000 mL | ORAL_SOLUTION | Freq: Three times a day (TID) | ORAL | Status: DC | PRN

## 2016-01-17 NOTE — Patient Instructions (Signed)
Start Azithromycin antibiotics. Take 2 tablets by mouth today, then 1 tablet daily for 4 additional days.  You may take the Hycodan cough suppressant every 8 hours as needed for cough and rest. Caution this medication contains codeine and will make you feel drowsy.  Ensure you are staying hydrated with water and rest.  Your cough may linger for 1-2 weeks after completion of antibiotics, but you should notice gradual improvement.  It was a pleasure to see you today!

## 2016-01-17 NOTE — Progress Notes (Signed)
Subjective:    Patient ID: Jamie Patterson, female    DOB: 1961/03/24, 55 y.o.   MRN: [id number]  HPI  Jamie Patterson is a 55 year old female with a history of acute bronchitis who presents today with a chief complaint of cough. She also reports headache, sore throat, chest congestion, fatigue. Her symptoms have been present for the past 2 weeks. She denies fevers, nausea, sick contacts. Her cough is mildly productive with yellow/green sputum. She's taken Delsym OTC for her cough without much improvement. Her cough is worse at night.   Review of Systems  Constitutional: Positive for fatigue. Negative for fever.  HENT: Positive for congestion and sore throat.   Respiratory: Positive for cough. Negative for shortness of breath.   Cardiovascular: Negative for chest pain.  Gastrointestinal: Negative for nausea.  Musculoskeletal: Negative for myalgias.       Past Medical History  Diagnosis Date  . HLD (hyperlipidemia)   . Depression     followed by psych  . Chronic pain due to injury     at R ankle due to ankle fracture  . Imbalance     likely multifactorial (s/p balance training at [facility] 08/2010) ?CMT  . Right ankle pain 2003    trimalleolar fx s/p fusion  . History of hypertension   . Iron deficiency anemia   . Seizure (HCC)     focal, ex vacuo ventriculomegaly?, pending neuro w/u with sleep deprived EEG  . Benign essential tremor     worsened by anxiety  . Amblyopia      Social History   Social History  . Marital Status: Divorced    Spouse Name: N/A  . Number of Children: 1  . Years of Education: N/A   Occupational History  . Disabled     depression  . DISABILITY    Social History Main Topics  . Smoking status: Former Smoker -- 1.00 packs/day for 20 years    Quit date: 08/12/1995  . Smokeless tobacco: Never Used  . Alcohol Use: No     Comment: None for > 1 year  . Drug Use: No  . Sexual Activity: Not on file   Other Topics Concern  . Not on file   Social History  Narrative   No caffeine. Divorced. 1 child. 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 Ankle fusion  02/2004    Right-50% disability   . Orif ankle fracture  06/2003    Right  . Cesarean section    . Hospitalization  08/2011    syncope thought 2/2 seizure, MRI - Hyperintensity in the cerebral white matter bilaterally is nonspecific, started on Keppra    Family History  Problem Relation Age of Onset  . Atrial fibrillation Father   . Hypertension Father   . Osteoarthritis Father   . Aortic aneurysm Father   . Depression Mother   . Hepatitis Brother     Hep. C  . Anemia Sister   . Arrhythmia Mother 45    Dx with VTach after having sudden collapse, did NOT have h/o AMI's     No Known Allergies  Current Outpatient Prescriptions on File Prior to Visit  Medication Sig Dispense Refill  . acetaminophen (TYLENOL) 500 MG tablet Take 500 mg by mouth as needed.     . Ascorbic  Acid (VITAMIN C) 1000 MG tablet Take 1,000 mg by mouth daily.    Marland Kitchen. aspirin EC 81 MG tablet Take 81 mg by mouth every evening.      . calcium-vitamin D (OSCAL WITH D) 500-200 MG-UNIT per tablet Take 1 tablet by mouth every evening.      . DULoxetine (CYMBALTA) 60 MG capsule Take 60 mg by mouth 2 (two) times daily.     Marland Kitchen. escitalopram (LEXAPRO) 10 MG tablet Take 10 mg by mouth daily.     Marland Kitchen. gabapentin (NEURONTIN) 300 MG capsule Take 600 mg by mouth 2 (two) times daily.     . Ginkgo Biloba 40 MG TABS Take by mouth daily.    Marland Kitchen. lamoTRIgine (LAMICTAL) 150 MG tablet TAKE 1 TABLET BY MOUTH TWICE A DAY 180 tablet 1  . MAGNESIUM PO Take by mouth daily.    . Melatonin 3 MG TABS Take 10 mg by mouth at bedtime.     . Multiple Vitamin (MULTIVITAMIN) tablet Take 1 tablet by mouth daily.    . naproxen sodium (ANAPROX) 220 MG tablet Take 440 mg by mouth as needed.     Marland Kitchen.  POTASSIUM PO Take by mouth daily.    . primidone (MYSOLINE) 50 MG tablet Take 1 tablet (50 mg total) by mouth at bedtime. 90 tablet 3  . propranolol (INDERAL) 40 MG tablet Take 1 tablet (40 mg total) by mouth daily. 90 tablet 3  . vitamin B-12 (CYANOCOBALAMIN) 1000 MCG tablet Take 500 mcg by mouth daily.      No current facility-administered medications on file prior to visit.    BP 120/80 mmHg  Pulse 83  Temp(Src) 98 F (36.7 C) (Oral)  Ht 5\' 7"  (1.702 m)  Wt 192 lb 12.8 oz (87.454 kg)  BMI 30.19 kg/m2  SpO2 98%  LMP 06/01/2013    Objective:   Physical Exam  Constitutional: She appears well-nourished.  HENT:  Right Ear: Tympanic membrane and ear canal normal.  Left Ear: Tympanic membrane and ear canal normal.  Nose: Right sinus exhibits no maxillary sinus tenderness and no frontal sinus tenderness. Left sinus exhibits no maxillary sinus tenderness and no frontal sinus tenderness.  Mouth/Throat: Oropharynx is clear and moist.  Eyes: Conjunctivae are normal.  Neck: Neck supple.  Cardiovascular: Normal rate and regular rhythm.   Pulmonary/Chest: Effort normal. She has no decreased breath sounds. She has no wheezes. She has rales in the left lower field.  Lymphadenopathy:    She has no cervical adenopathy.  Skin: Skin is warm and dry.          Assessment & Plan:  Acute Bronchitis:  Cough, congestion, fatigue x 2+ weeks. Mildly productive with yellow/green sputum. Persistent cough noted during exam today. Lungs with rales to bases, more so to left base. Does appear ill. Given duration, examination, and presentation will treat for presumed bacterial involvement. Rx for Zpak and Hycodan provided. Drowsiness precautions provided. Fluids, rest, return precautions provided.

## 2016-01-17 NOTE — Progress Notes (Signed)
Pre visit review using our clinic review tool, if applicable. No additional management support is needed unless otherwise documented below in the visit note. 

## 2016-01-28 ENCOUNTER — Other Ambulatory Visit: Payer: Self-pay | Admitting: Neurology

## 2016-01-28 NOTE — Telephone Encounter (Signed)
Propranolol refill requested. Per last office note- patient to remain on medication. Refill approved and sent to patient's pharmacy.   

## 2016-02-18 ENCOUNTER — Ambulatory Visit: Payer: Medicare Other | Admitting: Neurology

## 2016-02-27 ENCOUNTER — Other Ambulatory Visit: Payer: Self-pay | Admitting: Neurology

## 2016-02-27 NOTE — Telephone Encounter (Signed)
Rx sent 

## 2016-03-13 NOTE — Progress Notes (Addendum)
Jamie Patterson was seen today in neurologic consultation at the request of Jamie BoydenJavier Gutierrez, MD and Dr. Modesto Patterson.  Since Dr. Modesto Patterson is no longer with the practice, I will be resuming the patient's care.  I have reviewed the patient's prior medical records.    The patient has had 2 episodes of what sound like seizure.  These consisted of a vocalization, falling and generalized shaking.  An MRI of the brain was done and revealed multiple T2 hyperintensities, with some concern for demyelinating disease.  She subsequently had a lumbar puncture that was negative for oligoclonal bands.  She was placed on Keppra in addition to the Lamictal that she was already on for depression.  She subsequently had a sleep deprived EEG that was negative and therefore her Keppra was weaned and her Lamictal was increased, in hopes that that could be used as monotherapy for potential seizure and depression.  She remains on Wellbutrin, but she did talk with her psychiatrist about this.  They felt that the benefits outweigh the risks.  She was also placed on Neurontin since last visit.  She has only been on it since Friday.  She was on this for anxiety.  She does not know if it is helpful yet.  The patient also has tremor and after talking with Dr. Evelene Patterson, the patients psychiatrist, Dr. Modesto Patterson started the patient on propranolol 20 mg, 2 tablets times a day.  The patient reports that it has helped dramatically, at least 75% helpful.  She reports that tremor began after a w/d from oxycontin and valium (she went off of it "cold [place]" and did not realize that she was "addicted" and should wean it).  She may have had tremor before then but worse after that.  She could feel the tremor inside and that is much better. She is no longer having trouble with activities of daily living like she was. She still has some dizziness if she stands up quickly, bends over or turns quickly.  She has adjusted to this fairly well.  She continues to complain of chronic daily  headache.  The headaches are holocephalic.  She has daily nausea and she is not sure if that is from the headache from the multiple medications.  She does not have vomiting.  There has been no photophobia.  She does have a history of migraine.  06/14/13 update:  The patient is following up today regarding her essential tremor.  She is currently on propranolol 40 mg twice per day.  She continues to have some dizziness if she stands up quickly, bends over or turns.  She has had no seizures since our last visit.  She does remain on Wellbutrin, and understands that this lower seizure threshold.  She has talked about this with her psychiatrist and they think that the benefits outweigh risks.  She is very concerned about memory changes.  She is having word finding difficulty.  She admits to continued difficulty with uncontrolled depression, but states that she is not suicidal or homicidal.  12/19/13 update:  The patient is following up on essential tremor.  She is currently on Mysoline, and last visit we tried to stop the propranolol because of dizziness.  Unfortunately, tremor increased and we had to restart the propranolol but restarted it at daily dosing instead of twice a day dosing.  She is no longer dizzy and the tremor is better.  She has some "jerking" and twitching spells; is just one single jerk of an arm or leg.  She is on gabapentin, 300 mg, 1 in the AM and 2 at night.  She is on that for psychiatric reasons.  Not sure it is helping.  Is seeing psychiatry but insurance will no longer pay for counseling, and she cannot afford it, although she thought it was beneficial.  She also has a history of seizure and is on Lamictal.  Unfortunately, she remains on Wellbutrin for depression.  She has been seizure free, however.  She still complains of memory issues.  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 She was supposed to have neuropsych testing completed before this  visit but it was never scheduled.    06/21/14 update:  Pt returns today for follow-up regarding tremor.  She is on a combination of low-dose propranolol, 40 mg daily, and primidone 50 mg nightly.  Last visit, the patient was describing what sounded like myoclonus.  I thought that perhaps it was secondary to her Neurontin, which is being prescribed by psychiatry.  It was decreased and the jerking went away.  She went off of the Wellbutrin, changed to cymbalta and increased it a week ago.  She is still depressed and asks about recommendations for counselors.  She had great luck with Dr. Lendell Patterson but it was just too far to travel for routine f/u.  She did have neuropsych testing with Dr. Lendell Patterson that demonstrated major depression, social phobia.  All memory testing was normal and no evidence of amnestic memory d/o.  No evidence of neurodegenerative process.  12/19/14 update:  Pt returns for follow up.  She is on propranolol 40 mg and propranolol 50 q hs.  Tremor has perhaps increased some, but she thinks maybe that is due to increased anxiety.  She really does not want to change her medications.  She does have a hx of depression.  Dr. Evelene Patterson just doubled her cymbalta to 60 mg and her prozac was d/c.  It has only been 2 weeks.  She admits that her anxiety and depression have been fairly disabling.  She is not suicidal or homicidal.  She has not even been back to her primary care physician in over a year and a half.  She likes him but overall would rather have a female provider because of things that she has experienced in the past, and because she would rather have 1 provider who did her female exams.  She asks me about this today.  She d/c her BP med and her cholesterol med because she knew she had to go back to the dr for a refill and she was anxious about that.  She still does not have a Veterinary surgeoncounselor.  06/21/15 update:  The patient returns today for follow-up.  She has a history of essential tremor and is on  propranolol, 40 mg and primidone, 50 mg daily.  Tremor may be a little worse but "not bad like it was."  She reports that she has had more headaches; has a long hx of such.  Always R sided.  Is same headache she reports she saw Dr. Scarlette ShortsAdleman years ago.  She states that is pounding in nature.  No n/v.  She has photophobia but no phonophobia.  She takes aleve or advil.  She has noted that she has had to take that 4-5 days/week over the last few weeks.  States that it has been increased over the last month or so.  She continues to see Dr. Evelene Patterson for her depression.  10/19/15  update:  The patient returns today for follow-up.  She has a history of essential tremor and is on propranolol, 40 mg and primidone, 50 mg daily.  She continues to see Dr. [name] for her depression.  Last visit, she was complaining about increased headache, but she did refuse a daily prophylactic medication even though she likely needed one.  I did give her a course of Medrol to see if I could break up the acute overlying headache that she was having.  She does state that the headaches had gone away for a while but are back again over the last month and they aren't as bad as it was.  She states that it made her feel anxious and just "weird."   She still doesn't want a preventative.  She states that these may be more stress as are in the bilateral frontal region and worse in the evening.  She is taking excessive advil on a daily basis.   She has had lexapro added to her lamictal and cymbalta for depression.  She is also on gabapentin but is only on per day.    03/14/16 update:  The patient returns today for follow-up.  She remains on propranolol, 40 mg daily and primidone, 50 mg nightly for essential tremor.   She has noted a bit more tremor but isn't sure she really wants to go up on medication.  She does have a history of chronic migraine, but has not wanted preventative medication for this.  These have been better since she quit taking advil daily.   She did end up going to see Cristen Saffo but she felt that she wasn't the right person for her and she cannot find someone now who takes her insurance.     PREVIOUS MEDICATIONS: n/a  All:  No Known Allergies  Current Outpatient Prescriptions on File Prior to Visit  Medication Sig Dispense Refill  . acetaminophen (TYLENOL) 500 MG tablet Take 500 mg by mouth as needed.     . Ascorbic Acid (VITAMIN C) 1000 MG tablet Take 1,000 mg by mouth daily.    Marland Kitchen aspirin EC 81 MG tablet Take 81 mg by mouth every evening.      . calcium-vitamin D (OSCAL WITH D) 500-200 MG-UNIT per tablet Take 1 tablet by mouth every evening.      . DULoxetine (CYMBALTA) 60 MG capsule Take 60 mg by mouth 2 (two) times daily.     Marland Kitchen escitalopram (LEXAPRO) 10 MG tablet Take 10 mg by mouth daily.     Marland Kitchen gabapentin (NEURONTIN) 300 MG capsule Take 600 mg by mouth 2 (two) times daily.     . Ginkgo Biloba 40 MG TABS Take by mouth daily.    Marland Kitchen lamoTRIgine (LAMICTAL) 150 MG tablet TAKE 1 TABLET BY MOUTH TWICE A DAY 180 tablet 1  . MAGNESIUM PO Take by mouth daily.    . Melatonin 3 MG TABS Take 10 mg by mouth at bedtime.     . Multiple Vitamin (MULTIVITAMIN) tablet Take 1 tablet by mouth daily.    . naproxen sodium (ANAPROX) 220 MG tablet Take 440 mg by mouth as needed.     Marland Kitchen POTASSIUM PO Take by mouth daily.    . primidone (MYSOLINE) 50 MG tablet TAKE ONE TABLET BY MOUTH EVERY NIGHT AT BEDTIME 90 tablet 3  . propranolol (INDERAL) 40 MG tablet TAKE 1 TABLET BY MOUTH DAILY 90 tablet 1  . vitamin B-12 (CYANOCOBALAMIN) 1000 MCG tablet Take 500 mcg by mouth  daily.      No current facility-administered medications on file prior to visit.    Past Medical History:  Diagnosis Date  . Amblyopia   . Benign essential tremor    worsened by anxiety  . Chronic pain due to injury    at R ankle due to ankle fracture  . Depression    followed by psych  . History of hypertension   . HLD (hyperlipidemia)   . Imbalance    likely  multifactorial (s/p balance training at [facility] 08/2010) ?CMT  . Iron deficiency anemia   . Right ankle pain 2003   trimalleolar fx s/p fusion  . Seizure (HCC)    focal, ex vacuo ventriculomegaly?, pending neuro w/u with sleep deprived EEG   Past Surgical History:  Procedure Laterality Date  . ANKLE FUSION  02/2004   Right-50% disability   . CESAREAN SECTION    . hospitalization  08/2011   syncope thought 2/2 seizure, MRI - Hyperintensity in the cerebral white matter bilaterally is nonspecific, started on Keppra  . ORIF ANKLE FRACTURE  06/2003   Right   Social History   Social History  . Marital status: Divorced    Spouse name: N/A  . Number of children: 1  . Years of education: N/A   Occupational History  . Disabled     depression  . DISABILITY Unemployed   Social History Main Topics  . Smoking status: Former Smoker    Packs/day: 1.00    Years: 20.00    Quit date: 08/12/1995  . Smokeless tobacco: Never Used  . Alcohol use No     Comment: None for > 1 year  . Drug use: No  . Sexual activity: Not on file   Other Topics Concern  . Not on file   Social History Narrative   No caffeine. Divorced. 1 child. 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, although endorses that she falls a lot     ROS:  A complete 10 system review of systems was obtained and was unremarkable apart from what is mentioned above.  PHYSICAL EXAMINATION:    Vitals:   03/14/16 0837  BP: 120/84  Pulse: 90    GEN:  Normal appears female in no acute distress.  Appears stated age.  Appears somewhat anxious and teary. HEENT:  Normocephalic, atraumatic. The mucous membranes are moist. The superficial temporal arteries are without ropiness or tenderness.  No occipital notch tenderness CV:  RRR Lungs:  CTAB Neck:  No bruits   NEUROLOGICAL: Orientation:  The patient is alert and oriented x 3.   Cranial nerves: There is good facial symmetry.   Extraocular muscles are intact and visual fields are full to confrontational testing. Speech is fluent and clear. Soft palate rises symmetrically and there is no tongue deviation. Hearing is intact to conversational tone. Tone: Tone is good throughout. Sensation: Sensation is intact to light touch throughout (facial, trunk, extremities). Coordination:  The patient has no difficulty with RAM's or FNF bilaterally.   Abnormal Movements:  There is some minimal tremor of the outstretched hands, left more than right.  She has only minimal trouble with archimedes spirals on the left.   Motor: Strength is 5/5 in the bilateral upper and lower extremities.  Shoulder shrug is equal and symmetric. There is no pronator drift.  There are no fasciculations noted. Gait and Station: The patient ambulates without difficulty  Labs:   Wt Readings from  Last 3 Encounters:  03/14/16 196 lb (88.9 kg)  01/17/16 192 lb 12.8 oz (87.5 kg)  10/19/15 192 lb (87.1 kg)   Lab Results  Component Value Date   VITAMINB12 777 01/06/2012   Lab Results  Component Value Date   TSH 4.33 09/13/2013     Chemistry      Component Value Date/Time   NA 133 (L) 05/04/2015 1111   K 4.6 05/04/2015 1111   CL 99 05/04/2015 1111   CO2 28 05/04/2015 1111   BUN 11 05/04/2015 1111   CREATININE 0.76 05/04/2015 1111      Component Value Date/Time   CALCIUM 9.9 05/04/2015 1111   ALKPHOS 91 05/04/2015 1111   AST 27 05/04/2015 1111   ALT 15 05/04/2015 1111   BILITOT 0.4 05/04/2015 1111        IMPRESSION:  1. Essential Tremor, likely exacerbated by significant anxiety/depression. 2.  Possible seizure.  EEG has been normal, but clinically it sounds as if the patient had a seizure.  We are currently optimizing her antidepressant (Lamictal) for seizure control.  She is off of wellbutrin for depression 3.  Chronic migraine/chronic daily headache.  Better after we discussed rebound and avoiding daily use of analgesics 4.  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  Neuropsych testing was negative in this regard, demonstrating only major depression 5.  ? Myoclonus - resolved with decreased gabapentin dose   PLAN:  1. Talked about whether wants to increase primidone but decided to stay on 50 mg along with propranolol 40 mg.  Don't want to increase propranolol due to depression 2.  Continue with Dr. [name]. Talked to her about counseling.  She has had trouble finding someone.  Will email PCP and ask if he knows someone.  Liked Cristen Saffo but provider didn't feel that she was best for her.  Gave her info to sign up for my chart. 3.  F/u 4-6 months.  Much greater than 50% of this visit was spent in counseling with the patient.  Total face to face time:  25 min

## 2016-03-14 ENCOUNTER — Ambulatory Visit (INDEPENDENT_AMBULATORY_CARE_PROVIDER_SITE_OTHER): Payer: Medicare Other | Admitting: Neurology

## 2016-03-14 ENCOUNTER — Encounter: Payer: Self-pay | Admitting: Neurology

## 2016-03-14 VITALS — BP 120/84 | HR 90 | Ht 67.0 in | Wt 196.0 lb

## 2016-03-14 DIAGNOSIS — G250 Essential tremor: Secondary | ICD-10-CM | POA: Diagnosis not present

## 2016-03-14 DIAGNOSIS — F331 Major depressive disorder, recurrent, moderate: Secondary | ICD-10-CM | POA: Diagnosis not present

## 2016-04-01 ENCOUNTER — Encounter: Payer: Self-pay | Admitting: Family Medicine

## 2016-04-24 ENCOUNTER — Telehealth: Payer: Self-pay | Admitting: Family Medicine

## 2016-04-24 DIAGNOSIS — F331 Major depressive disorder, recurrent, moderate: Secondary | ICD-10-CM

## 2016-04-24 NOTE — Telephone Encounter (Signed)
Referral placed  Thanks!

## 2016-04-24 NOTE — Telephone Encounter (Signed)
Pt called she would like to have referral to see Dr Laymond PurserPerrin.   cb number is [phone] Thanks

## 2016-05-09 ENCOUNTER — Ambulatory Visit (INDEPENDENT_AMBULATORY_CARE_PROVIDER_SITE_OTHER): Payer: 59 | Admitting: Psychology

## 2016-05-09 DIAGNOSIS — F331 Major depressive disorder, recurrent, moderate: Secondary | ICD-10-CM

## 2016-05-16 ENCOUNTER — Ambulatory Visit (INDEPENDENT_AMBULATORY_CARE_PROVIDER_SITE_OTHER): Payer: 59 | Admitting: Psychology

## 2016-05-16 DIAGNOSIS — F331 Major depressive disorder, recurrent, moderate: Secondary | ICD-10-CM

## 2016-05-28 ENCOUNTER — Ambulatory Visit (INDEPENDENT_AMBULATORY_CARE_PROVIDER_SITE_OTHER): Payer: 59 | Admitting: Psychology

## 2016-05-28 DIAGNOSIS — F331 Major depressive disorder, recurrent, moderate: Secondary | ICD-10-CM | POA: Diagnosis not present

## 2016-06-18 ENCOUNTER — Ambulatory Visit (INDEPENDENT_AMBULATORY_CARE_PROVIDER_SITE_OTHER): Payer: 59 | Admitting: Psychology

## 2016-06-18 DIAGNOSIS — F331 Major depressive disorder, recurrent, moderate: Secondary | ICD-10-CM

## 2016-07-01 ENCOUNTER — Other Ambulatory Visit: Payer: Self-pay | Admitting: Neurology

## 2016-07-15 ENCOUNTER — Encounter: Payer: Self-pay | Admitting: Primary Care

## 2016-07-15 ENCOUNTER — Ambulatory Visit (INDEPENDENT_AMBULATORY_CARE_PROVIDER_SITE_OTHER): Payer: Medicare Other | Admitting: Primary Care

## 2016-07-15 VITALS — BP 130/84 | HR 83 | Temp 98.1°F | Ht 67.0 in | Wt 212.8 lb

## 2016-07-15 DIAGNOSIS — J0100 Acute maxillary sinusitis, unspecified: Secondary | ICD-10-CM | POA: Diagnosis not present

## 2016-07-15 MED ORDER — HYDROCODONE-HOMATROPINE 5-1.5 MG/5ML PO SYRP: 5.0000 mL | ORAL_SOLUTION | Freq: Every evening | ORAL | 0 refills | Status: DC | PRN

## 2016-07-15 MED ORDER — AMOXICILLIN-POT CLAVULANATE 875-125 MG PO TABS: 1.0000 | ORAL_TABLET | Freq: Two times a day (BID) | ORAL | 0 refills | Status: DC

## 2016-07-15 NOTE — Progress Notes (Signed)
Subjective:    Patient ID: Jamie Patterson, female    DOB: 06-20-1961, 55 y.o.   MRN: [id number]  HPI  Jamie Patterson is a 55 year old female who presents today with a chief complaint of nasal congestion. Jamie Patterson also reports hoarse voice, cough, chest congestion, right sided sinus pressure, fatigue. Her symptoms have been present for the past 5 weeks.   Jamie Patterson's blowing out dark green mucous from her nasal cavity. Jamie Patterson denies fevers, chills, nausea, sore throat, esophageal burning. Jamie Patterson's taken Delsym, Mucinex-D, Mucinex-DM, Nyquil, cough drops without much improvement. Three weeks ago Jamie Patterson took a six day course of cephalexin that was left over from her boyfriends prescription. Jamie Patterson did experience temporary improvement for 1 week until symptoms returned. Over the past several days Jamie Patterson's started to feel worse.  Review of Systems  Constitutional: Positive for fatigue. Negative for chills and fever.  HENT: Positive for congestion, postnasal drip and sinus pressure. Negative for sore throat.   Respiratory: Positive for cough. Negative for shortness of breath.   Cardiovascular: Negative for chest pain.       Past Medical History:  Diagnosis Date  . Amblyopia   . Benign essential tremor    worsened by anxiety  . Chronic pain due to injury    at R ankle due to ankle fracture  . Depression    followed by psych  . History of hypertension   . HLD (hyperlipidemia)   . Imbalance    likely multifactorial (s/p balance training at [facility] 08/2010) ?CMT  . Iron deficiency anemia   . Right ankle pain 2003   trimalleolar fx s/p fusion  . Seizure (HCC)    focal, ex vacuo ventriculomegaly?, pending neuro w/u with sleep deprived EEG     Social History   Social History  . Marital status: Divorced    Spouse name: N/A  . Number of children: 1  . Years of education: N/A   Occupational History  . Disabled     depression  . DISABILITY Unemployed   Social History Main Topics  . Smoking status: Former Smoker   Packs/day: 1.00    Years: 20.00    Quit date: 08/12/1995  . Smokeless tobacco: Never Used  . Alcohol use No     Comment: None for > 1 year  . Drug use: No  . Sexual activity: Not on file   Other Topics Concern  . Not on file   Social History Narrative   No caffeine. Divorced. 1 child. 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 ANKLE FUSION  02/2004   Right-50% disability   . CESAREAN SECTION    . hospitalization  08/2011   syncope thought 2/2 seizure, MRI - Hyperintensity in the cerebral white matter bilaterally is nonspecific, started on Keppra  . ORIF ANKLE FRACTURE  06/2003   Right    Family History  Problem Relation Age of Onset  . Atrial fibrillation Father   . Hypertension Father   . Osteoarthritis Father   . Aortic aneurysm Father   . Depression Mother   . Hepatitis Brother     Hep. C  . Anemia Sister   . Arrhythmia Mother 28    Dx with VTach after having sudden collapse, did NOT have h/o AMI's     No Known Allergies  Current Outpatient  Prescriptions on File Prior to Visit  Medication Sig Dispense Refill  . acetaminophen (TYLENOL) 500 MG tablet Take 500 mg by mouth as needed.     . Ascorbic Acid (VITAMIN C) 1000 MG tablet Take 1,000 mg by mouth daily.    Marland Kitchen aspirin EC 81 MG tablet Take 81 mg by mouth every evening.      . calcium-vitamin D (OSCAL WITH D) 500-200 MG-UNIT per tablet Take 1 tablet by mouth every evening.      . DULoxetine (CYMBALTA) 60 MG capsule Take 60 mg by mouth 2 (two) times daily.     Marland Kitchen escitalopram (LEXAPRO) 10 MG tablet Take 10 mg by mouth daily.     Marland Kitchen gabapentin (NEURONTIN) 300 MG capsule Take 600 mg by mouth 2 (two) times daily.     Marland Kitchen lamoTRIgine (LAMICTAL) 150 MG tablet TAKE 1 TABLET BY MOUTH TWICE A DAY 180 tablet 0  . MAGNESIUM PO Take by mouth daily.    . Melatonin 3 MG TABS  Take 10 mg by mouth at bedtime.     . Multiple Vitamin (MULTIVITAMIN) tablet Take 1 tablet by mouth daily.    . naproxen sodium (ANAPROX) 220 MG tablet Take 440 mg by mouth as needed.     Marland Kitchen POTASSIUM PO Take by mouth daily.    . primidone (MYSOLINE) 50 MG tablet TAKE ONE TABLET BY MOUTH EVERY NIGHT AT BEDTIME 90 tablet 3  . propranolol (INDERAL) 40 MG tablet TAKE 1 TABLET BY MOUTH DAILY 90 tablet 1  . vitamin B-12 (CYANOCOBALAMIN) 1000 MCG tablet Take 500 mcg by mouth daily.      No current facility-administered medications on file prior to visit.     BP 130/84   Pulse 83   Temp 98.1 F (36.7 C) (Oral)   Ht 5\' 7"  (1.702 m)   Wt 212 lb 12.8 oz (96.5 kg)   LMP 06/01/2013   SpO2 99%   BMI 33.33 kg/m    Objective:   Physical Exam  Constitutional: Jamie Patterson appears well-nourished.  HENT:  Right Ear: Tympanic membrane and ear canal normal.  Left Ear: Tympanic membrane and ear canal normal.  Nose: Mucosal edema present. Right sinus exhibits maxillary sinus tenderness. Right sinus exhibits no frontal sinus tenderness. Left sinus exhibits no maxillary sinus tenderness and no frontal sinus tenderness.  Mouth/Throat: Oropharynx is clear and moist.  Eyes: Conjunctivae are normal.  Neck: Neck supple.  Cardiovascular: Normal rate and regular rhythm.   Pulmonary/Chest: Effort normal and breath sounds normal. Jamie Patterson has no wheezes. Jamie Patterson has no rales.  Lymphadenopathy:    Jamie Patterson has no cervical adenopathy.  Skin: Skin is warm and dry.          Assessment & Plan:  Acute Sinusitis:  Nasal congestin, sinus pressure x 5 weeks. Temporary improvement with old cephalexin prescription Jamie Patterson took 3 weeks ago. Increased sinus pressure with expulsion of green, thick mucous from nasal cavity. Given duration of symptoms, examination, and presents of purulent mucous will treat for presumed bacterial involvement. Rx for Augmentin sent to pharmacy. Dicussed Neti Pot Rinses, Flonase, fluids. Follow up  PRN.  Morrie Sheldon, NP

## 2016-07-15 NOTE — Progress Notes (Signed)
Pre visit review using our clinic review tool, if applicable. No additional management support is needed unless otherwise documented below in the visit note. 

## 2016-07-15 NOTE — Patient Instructions (Signed)
Start Augmentin antibiotics. Take 1 tablet by mouth twice daily for 10 days.  You may take the Hycodan cough suppressant at bedtime as needed for cough and rest. Caution this medication contains codeine and will make you feel drowsy.  Nasal Congestion/Sinus Pressure: Try using Flonase (fluticasone) nasal spray. Instill 1 spray in each nostril twice daily.   Ensure you are staying hydrated with water and rest.  It was a pleasure to see you today!

## 2016-07-24 ENCOUNTER — Ambulatory Visit (INDEPENDENT_AMBULATORY_CARE_PROVIDER_SITE_OTHER): Payer: 59 | Admitting: Psychology

## 2016-07-24 DIAGNOSIS — F4481 Dissociative identity disorder: Secondary | ICD-10-CM

## 2016-07-24 DIAGNOSIS — F331 Major depressive disorder, recurrent, moderate: Secondary | ICD-10-CM

## 2016-08-07 ENCOUNTER — Other Ambulatory Visit: Payer: Self-pay | Admitting: Neurology

## 2016-08-12 NOTE — Progress Notes (Signed)
Jamie Patterson was seen today in neurologic consultation at the request of Jamie BoydenJavier Gutierrez, MD and [name].  Since [name] is no longer with the practice, I will be resuming the patient's care.  I have reviewed the patient's prior medical records.    The patient has had 2 episodes of what sound like seizure.  These consisted of a vocalization, falling and generalized shaking.  An MRI of the brain was done and revealed multiple T2 hyperintensities, with some concern for demyelinating disease.  She subsequently had a lumbar puncture that was negative for oligoclonal bands.  She was placed on Keppra in addition to the Lamictal that she was already on for depression.  She subsequently had a sleep deprived EEG that was negative and therefore her Keppra was weaned and her Lamictal was increased, in hopes that that could be used as monotherapy for potential seizure and depression.  She remains on Wellbutrin, but she did talk with her psychiatrist about this.  They felt that the benefits outweigh the risks.  She was also placed on Neurontin since last visit.  She has only been on it since Friday.  She was on this for anxiety.  She does not know if it is helpful yet.  The patient also has tremor and after talking with [name], the patients psychiatrist, [name] started the patient on propranolol 20 mg, 2 tablets times a day.  The patient reports that it has helped dramatically, at least 75% helpful.  She reports that tremor began after a w/d from oxycontin and valium (she went off of it "cold Jamie Patterson" and did not realize that she was "addicted" and should wean it).  She may have had tremor before then but worse after that.  She could feel the tremor inside and that is much better. She is no longer having trouble with activities of daily living like she was. She still has some dizziness if she stands up quickly, bends over or turns quickly.  She has adjusted to this fairly well.  She continues to complain of chronic daily  headache.  The headaches are holocephalic.  She has daily nausea and she is not sure if that is from the headache from the multiple medications.  She does not have vomiting.  There has been no photophobia.  She does have a history of migraine.  06/14/13 update:  The patient is following up today regarding her essential tremor.  She is currently on propranolol 40 mg twice per day.  She continues to have some dizziness if she stands up quickly, bends over or turns.  She has had no seizures since our last visit.  She does remain on Wellbutrin, and understands that this lower seizure threshold.  She has talked about this with her psychiatrist and they think that the benefits outweigh risks.  She is very concerned about memory changes.  She is having word finding difficulty.  She admits to continued difficulty with uncontrolled depression, but states that she is not suicidal or homicidal.  12/19/13 update:  The patient is following up on essential tremor.  She is currently on Mysoline, and last visit we tried to stop the propranolol because of dizziness.  Unfortunately, tremor increased and we had to restart the propranolol but restarted it at daily dosing instead of twice a day dosing.  She is no longer dizzy and the tremor is better.  She has some "jerking" and twitching spells; is just one single jerk of an arm or leg.  She is on gabapentin, 300 mg, 1 in the AM and 2 at night.  She is on that for psychiatric reasons.  Not sure it is helping.  Is seeing psychiatry but insurance will no longer pay for counseling, and she cannot afford it, although she thought it was beneficial.  She also has a history of seizure and is on Lamictal.  Unfortunately, she remains on Wellbutrin for depression.  She has been seizure free, however.  She still complains of memory issues.  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 She was supposed to have neuropsych testing completed before this  visit but it was never scheduled.    06/21/14 update:  Pt returns today for follow-up regarding tremor.  She is on a combination of low-dose propranolol, 40 mg daily, and primidone 50 mg nightly.  Last visit, the patient was describing what sounded like myoclonus.  I thought that perhaps it was secondary to her Neurontin, which is being prescribed by psychiatry.  It was decreased and the jerking went away.  She went off of the Wellbutrin, changed to cymbalta and increased it a week ago.  She is still depressed and asks about recommendations for counselors.  She had great luck with Dr. Lendell CapriceSullivan but it was just too far to travel for routine f/u.  She did have neuropsych testing with Dr. Lendell CapriceSullivan that demonstrated major depression, social phobia.  All memory testing was normal and no evidence of amnestic memory d/o.  No evidence of neurodegenerative process.  12/19/14 update:  Pt returns for follow up.  She is on propranolol 40 mg and propranolol 50 q hs.  Tremor has perhaps increased some, but she thinks maybe that is due to increased anxiety.  She really does not want to change her medications.  She does have a hx of depression.  [name] just doubled her cymbalta to 60 mg and her prozac was d/c.  It has only been 2 weeks.  She admits that her anxiety and depression have been fairly disabling.  She is not suicidal or homicidal.  She has not even been back to her primary care physician in over a year and a half.  She likes him but overall would rather have a female provider because of things that she has experienced in the past, and because she would rather have 1 provider who did her female exams.  She asks me about this today.  She d/c her BP med and her cholesterol med because she knew she had to go back to the dr for a refill and she was anxious about that.  She still does not have a Veterinary surgeoncounselor.  06/21/15 update:  The patient returns today for follow-up.  She has a history of essential tremor and is on  propranolol, 40 mg and primidone, 50 mg daily.  Tremor may be a little worse but "not bad like it was."  She reports that she has had more headaches; has a long hx of such.  Always R sided.  Is same headache she reports she saw Dr. Scarlette ShortsAdleman years ago.  She states that is pounding in nature.  No n/v.  She has photophobia but no phonophobia.  She takes aleve or advil.  She has noted that she has had to take that 4-5 days/week over the last few weeks.  States that it has been increased over the last month or so.  She continues to see [name] for her depression.  10/19/15  update:  The patient returns today for follow-up.  She has a history of essential tremor and is on propranolol, 40 mg and primidone, 50 mg daily.  She continues to see [name] for her depression.  Last visit, she was complaining about increased headache, but she did refuse a daily prophylactic medication even though she likely needed one.  I did give her a course of Medrol to see if I could break up the acute overlying headache that she was having.  She does state that the headaches had gone away for a while but are back again over the last month and they aren't as bad as it was.  She states that it made her feel anxious and just "weird."   She still doesn't want a preventative.  She states that these may be more stress as are in the bilateral frontal region and worse in the evening.  She is taking excessive advil on a daily basis.   She has had lexapro added to her lamictal and cymbalta for depression.  She is also on gabapentin but is only on per day.    03/14/16 update:  The patient returns today for follow-up.  She remains on propranolol, 40 mg daily and primidone, 50 mg nightly for essential tremor.   She has noted a bit more tremor but isn't sure she really wants to go up on medication.  She does have a history of chronic migraine, but has not wanted preventative medication for this.  These have been better since she quit taking advil daily.   She did end up going to see Cristen Saffo but she felt that she wasn't the right person for her and she cannot find someone now who takes her insurance.    08/14/16 update:  The patient returns today for follow-up.  She is still on primidone, 50 mg daily for essential tremor.  She is also on propranolol, 40 mg daily.  Tremor has been about the same.  C/O cramping in the legs and feet.  States that having some restlessness in the feet and legs but is intermittent.  On gabapentin 600 mg bid for psychiatric reasons.    She is still seeing [name] for her depression.  She denies suicidal or homicidal ideation.  She is now seeing Dr. Evalina FieldJane Perrin and really likes her and is pleased with that.     PREVIOUS MEDICATIONS: n/a  All:  No Known Allergies  Current Outpatient Prescriptions on File Prior to Visit  Medication Sig Dispense Refill  . acetaminophen (TYLENOL) 500 MG tablet Take 500 mg by mouth as needed.     . Ascorbic Acid (VITAMIN C) 1000 MG tablet Take 1,000 mg by mouth daily.    Marland Kitchen. aspirin EC 81 MG tablet Take 81 mg by mouth every evening.      . calcium-vitamin D (OSCAL WITH D) 500-200 MG-UNIT per tablet Take 1 tablet by mouth every evening.      . DULoxetine (CYMBALTA) 60 MG capsule Take 60 mg by mouth 2 (two) times daily.     Marland Kitchen. escitalopram (LEXAPRO) 10 MG tablet Take 10 mg by mouth daily.     Marland Kitchen. gabapentin (NEURONTIN) 300 MG capsule Take 600 mg by mouth 2 (two) times daily.     Marland Kitchen. lamoTRIgine (LAMICTAL) 150 MG tablet TAKE 1 TABLET BY MOUTH TWICE A DAY 180 tablet 0  . MAGNESIUM PO Take by mouth daily.    . Melatonin 3 MG TABS Take 10 mg by mouth at  bedtime.     . Multiple Vitamin (MULTIVITAMIN) tablet Take 1 tablet by mouth daily.    . naproxen sodium (ANAPROX) 220 MG tablet Take 440 mg by mouth as needed.     Marland Kitchen POTASSIUM PO Take by mouth daily.    . primidone (MYSOLINE) 50 MG tablet TAKE ONE TABLET BY MOUTH EVERY NIGHT AT BEDTIME 90 tablet 3  . propranolol (INDERAL) 40 MG tablet TAKE 1  TABLET BY MOUTH DAILY 90 tablet 0  . vitamin B-12 (CYANOCOBALAMIN) 1000 MCG tablet Take 500 mcg by mouth daily.      No current facility-administered medications on file prior to visit.    Past Medical History:  Diagnosis Date  . Amblyopia   . Benign essential tremor    worsened by anxiety  . Chronic pain due to injury    at R ankle due to ankle fracture  . Depression    followed by psych  . History of hypertension   . HLD (hyperlipidemia)   . Imbalance    likely multifactorial (s/p balance training at [facility] 08/2010) ?CMT  . Iron deficiency anemia   . Right ankle pain 2003   trimalleolar fx s/p fusion  . Seizure (HCC)    focal, ex vacuo ventriculomegaly?, pending neuro w/u with sleep deprived EEG   Past Surgical History:  Procedure Laterality Date  . ANKLE FUSION  02/2004   Right-50% disability   . CESAREAN SECTION    . hospitalization  08/2011   syncope thought 2/2 seizure, MRI - Hyperintensity in the cerebral white matter bilaterally is nonspecific, started on Keppra  . ORIF ANKLE FRACTURE  06/2003   Right   Social History   Social History  . Marital status: Divorced    Spouse name: N/A  . Number of children: 1  . Years of education: N/A   Occupational History  . Disabled     depression  . DISABILITY Unemployed   Social History Main Topics  . Smoking status: Former Smoker    Packs/day: 1.00    Years: 20.00    Quit date: 08/12/1995  . Smokeless tobacco: Never Used  . Alcohol use No     Comment: None for > 1 year  . Drug use: No  . Sexual activity: Not on file   Other Topics Concern  . Not on file   Social History Narrative   No caffeine. Divorced. 1 child. 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, although endorses that she falls a lot     ROS:  A complete 10 system review of systems was obtained and was unremarkable apart from what is mentioned above.  PHYSICAL EXAMINATION:    Vitals:    08/14/16 1253  BP: 122/68  Pulse: 80    GEN:  Normal appears female in no acute distress.  Appears stated age.  Appears somewhat anxious and teary. HEENT:  Normocephalic, atraumatic. The mucous membranes are moist. The superficial temporal arteries are without ropiness or tenderness.  No occipital notch tenderness CV:  RRR Lungs:  CTAB Neck:  No bruits   NEUROLOGICAL: Orientation:  The patient is alert and oriented x 3.   Cranial nerves: There is good facial symmetry.  Extraocular muscles are intact and visual fields are full to confrontational testing. Speech is fluent and clear. Soft palate rises symmetrically and there is no tongue deviation. Hearing is intact to conversational tone. Tone: Tone is good throughout. Sensation: Sensation is intact to  light touch throughout (facial, trunk, extremities). Coordination:  The patient has no difficulty with RAM's or FNF bilaterally.   Abnormal Movements:  There is some minimal tremor of the outstretched hands, left more than right.  She has only minimal trouble with archimedes spirals on the left.   Motor: Strength is 5/5 in the bilateral upper and lower extremities.  Shoulder shrug is equal and symmetric. There is no pronator drift.  There are no fasciculations noted. Gait and Station: The patient ambulates without difficulty  Labs:   Wt Readings from Last 3 Encounters:  08/14/16 219 lb (99.3 kg)  07/15/16 212 lb 12.8 oz (96.5 kg)  03/14/16 196 lb (88.9 kg)   Lab Results  Component Value Date   VITAMINB12 777 01/06/2012   Lab Results  Component Value Date   TSH 4.33 09/13/2013     Chemistry      Component Value Date/Time   NA 133 (L) 05/04/2015 1111   K 4.6 05/04/2015 1111   CL 99 05/04/2015 1111   CO2 28 05/04/2015 1111   BUN 11 05/04/2015 1111   CREATININE 0.76 05/04/2015 1111      Component Value Date/Time   CALCIUM 9.9 05/04/2015 1111   ALKPHOS 91 05/04/2015 1111   AST 27 05/04/2015 1111   ALT 15 05/04/2015 1111    BILITOT 0.4 05/04/2015 1111     Lab Results  Component Value Date   WBC 5.8 05/04/2015   HGB 12.5 05/04/2015   HCT 37.8 05/04/2015   MCV 86.4 05/04/2015   PLT 289.0 05/04/2015      IMPRESSION:  1. Essential Tremor, likely exacerbated by significant anxiety/depression. 2.  Possible seizure.  EEG has been normal, but clinically it sounds as if the patient had a seizure.  We are currently optimizing her antidepressant (Lamictal) for seizure control.  She is off of wellbutrin for depression 3.  Chronic migraine/chronic daily headache.  Better after we discussed rebound and avoiding daily use of analgesics 4.  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  Neuropsych testing was negative in this regard, demonstrating only major depression 5.  ? Myoclonus - resolved with decreased gabapentin dose 6.  RLS and LE cramping   PLAN:  1. Talked about whether wants to increase primidone but decided to stay on 50 mg along with propranolol 40 mg.  Don't want to increase propranolol due to depression 2.  Continue with Dr. [name].  3.  CBC, chem, iron, ferritin, TIBC 4.  Start tonic water for cramping of legs  5.  F/u 6 months.  Much greater than 50% of this visit was spent in counseling with the patient.  Total face to face time:  25 min

## 2016-08-14 ENCOUNTER — Encounter: Payer: Self-pay | Admitting: Neurology

## 2016-08-14 ENCOUNTER — Ambulatory Visit (INDEPENDENT_AMBULATORY_CARE_PROVIDER_SITE_OTHER): Payer: Medicare Other | Admitting: Neurology

## 2016-08-14 ENCOUNTER — Ambulatory Visit (INDEPENDENT_AMBULATORY_CARE_PROVIDER_SITE_OTHER): Payer: 59 | Admitting: Psychology

## 2016-08-14 ENCOUNTER — Other Ambulatory Visit: Payer: Medicare Other

## 2016-08-14 VITALS — BP 122/68 | HR 80 | Ht 67.0 in | Wt 219.0 lb

## 2016-08-14 DIAGNOSIS — G2581 Restless legs syndrome: Secondary | ICD-10-CM

## 2016-08-14 DIAGNOSIS — D509 Iron deficiency anemia, unspecified: Secondary | ICD-10-CM

## 2016-08-14 DIAGNOSIS — G250 Essential tremor: Secondary | ICD-10-CM

## 2016-08-14 DIAGNOSIS — F4310 Post-traumatic stress disorder, unspecified: Secondary | ICD-10-CM

## 2016-08-14 DIAGNOSIS — Z5181 Encounter for therapeutic drug level monitoring: Secondary | ICD-10-CM

## 2016-08-14 LAB — CBC WITH DIFFERENTIAL/PLATELET
BASOS PCT: 0 %
Basophils Absolute: 0 cells/uL (ref 0–200)
EOS ABS: 96 {cells}/uL (ref 15–500)
Eosinophils Relative: 2 %
HCT: 35.5 % (ref 35.0–45.0)
Hemoglobin: 11.4 g/dL — ABNORMAL LOW (ref 11.7–15.5)
LYMPHS PCT: 30 %
Lymphs Abs: 1440 cells/uL (ref 850–3900)
MCH: 28.2 pg (ref 27.0–33.0)
MCHC: 32.1 g/dL (ref 32.0–36.0)
MCV: 87.9 fL (ref 80.0–100.0)
MONOS PCT: 10 %
MPV: 9.4 fL (ref 7.5–12.5)
Monocytes Absolute: 480 cells/uL (ref 200–950)
Neutro Abs: 2784 cells/uL (ref 1500–7800)
Neutrophils Relative %: 58 %
PLATELETS: 262 10*3/uL (ref 140–400)
RBC: 4.04 MIL/uL (ref 3.80–5.10)
RDW: 15.4 % — AB (ref 11.0–15.0)
WBC: 4.8 10*3/uL (ref 3.8–10.8)

## 2016-08-14 LAB — FERRITIN: FERRITIN: 7 ng/mL — AB (ref 10–232)

## 2016-08-14 NOTE — Patient Instructions (Signed)
1. Try a shot glass of tonic water at bedtime to help with cramping.   2. Your provider has requested that you have labwork completed today. Please go to [facility] ([id number]) on the second floor of this building before leaving the office today. You do not need to check in. If you are not called within 15 minutes please check with the front desk.

## 2016-08-18 LAB — COMPREHENSIVE METABOLIC PANEL
ALK PHOS: 101 U/L (ref 33–130)
ALT: 29 U/L (ref 6–29)
AST: 32 U/L (ref 10–35)
Albumin: 4.1 g/dL (ref 3.6–5.1)
BUN: 8 mg/dL (ref 7–25)
CO2: 23 mmol/L (ref 20–31)
CREATININE: 0.62 mg/dL (ref 0.50–1.05)
Calcium: 8.8 mg/dL (ref 8.6–10.4)
Chloride: 103 mmol/L (ref 98–110)
GLUCOSE: 97 mg/dL (ref 65–99)
Potassium: 4.2 mmol/L (ref 3.5–5.3)
SODIUM: 137 mmol/L (ref 135–146)
TOTAL PROTEIN: 6.2 g/dL (ref 6.1–8.1)
Total Bilirubin: 0.4 mg/dL (ref 0.2–1.2)

## 2016-08-18 LAB — IBC PANEL
%SAT: 14 % (ref 11–50)
TIBC: 468 ug/dL — ABNORMAL HIGH (ref 250–450)
UIBC: 404 ug/dL — ABNORMAL HIGH (ref 125–400)

## 2016-08-18 LAB — IRON: Iron: 64 ug/dL (ref 45–160)

## 2016-08-19 ENCOUNTER — Telehealth: Payer: Self-pay | Admitting: Neurology

## 2016-08-19 NOTE — Telephone Encounter (Signed)
Lab results sent to PCP

## 2016-08-19 NOTE — Telephone Encounter (Signed)
Patient made aware.

## 2016-08-19 NOTE — Telephone Encounter (Signed)
-----   Message from Octaviano Battyebecca S Tat, DO sent at 08/18/2016  4:30 PM EST ----- Let pt know that she has iron deficiency anemia which is likely the cause of the restless leg.  May want to f/u with the PCP to see if they can figure out why iron deficient but taking iron should help.

## 2016-08-22 ENCOUNTER — Telehealth: Payer: Self-pay | Admitting: Family Medicine

## 2016-08-25 ENCOUNTER — Ambulatory Visit (INDEPENDENT_AMBULATORY_CARE_PROVIDER_SITE_OTHER): Payer: Medicare Other | Admitting: Family Medicine

## 2016-08-25 ENCOUNTER — Encounter: Payer: Self-pay | Admitting: Family Medicine

## 2016-08-25 VITALS — BP 136/86 | HR 80 | Temp 98.3°F | Wt 221.2 lb

## 2016-08-25 DIAGNOSIS — J069 Acute upper respiratory infection, unspecified: Secondary | ICD-10-CM | POA: Insufficient documentation

## 2016-08-25 DIAGNOSIS — D509 Iron deficiency anemia, unspecified: Secondary | ICD-10-CM

## 2016-08-25 DIAGNOSIS — J040 Acute laryngitis: Secondary | ICD-10-CM | POA: Diagnosis not present

## 2016-08-25 MED ORDER — HYDROCOD POLST-CPM POLST ER 10-8 MG/5ML PO SUER: 5.0000 mL | Freq: Every evening | ORAL | 0 refills | Status: DC | PRN

## 2016-08-25 MED ORDER — AZITHROMYCIN 250 MG PO TABS: ORAL_TABLET | ORAL | 0 refills | Status: DC

## 2016-08-25 MED ORDER — PREDNISONE 20 MG PO TABS: ORAL_TABLET | ORAL | 0 refills | Status: DC

## 2016-08-25 NOTE — Assessment & Plan Note (Signed)
Discussed ferrous sulfate dosing and optimal administration. Pt denies any recent bleeding. Pt has not completed colon cancer screening - update iFOB.

## 2016-08-25 NOTE — Patient Instructions (Addendum)
Start ferrous sulfate 325mg (65FE) one daily. Pass by lab to pick up stool kit. You have bronchitis with laryngitis. Treat with zpack and prednisone course for inflammation. Use tussionex for cough suppressant as needed (caution it may make you sleepy).  

## 2016-08-25 NOTE — Progress Notes (Signed)
BP 136/86   Pulse 80   Temp 98.3 F (36.8 C) (Oral)   Wt 221 lb 4 oz (100.4 kg)   LMP 06/01/2013   SpO2 98%   BMI 34.65 kg/m    CC: cough Subjective:    Patient ID: Jamie Patterson, female    DOB: April 28, 1961, 56 y.o.   MRN: [id number]  HPI: Jamie Patterson is a 56 y.o. female presenting on 08/25/2016 for Cough (treated with amoxicillin in December and is worse now; also has laryngitis )   Seen 07/2016 by Anda Kraft with 5wk h/o sinus congestion and cough, dx sinusitis, treated with augmentin and flonase. Symptoms improved but never fully better. 2 wks ago symptoms again worsened. Body aches from coughing. Lost voice last week as well. Dark green mucous with cough. Swollen glands present as well. Dyspnea and wheezing present.   No fevers, ear or tooth pain.  Grand daughter sick at home.  No h/o asthma.  Not around smokers.   Has tried delsym, robitussin, mucinex, cough drops.   Recent dx mild iron deficiency anemia.   Relevant past medical, surgical, family and social history reviewed and updated as indicated. Interim medical history since our last visit reviewed. Allergies and medications reviewed and updated. Current Outpatient Prescriptions on File Prior to Visit  Medication Sig  . acetaminophen (TYLENOL) 500 MG tablet Take 500 mg by mouth as needed.   . Ascorbic Acid (VITAMIN C) 1000 MG tablet Take 1,000 mg by mouth daily.  Marland Kitchen aspirin EC 81 MG tablet Take 81 mg by mouth every evening.    . DULoxetine (CYMBALTA) 60 MG capsule Take 60 mg by mouth 2 (two) times daily.   Marland Kitchen escitalopram (LEXAPRO) 10 MG tablet Take 10 mg by mouth daily.   Marland Kitchen gabapentin (NEURONTIN) 300 MG capsule Take 600 mg by mouth 2 (two) times daily.   Marland Kitchen lamoTRIgine (LAMICTAL) 150 MG tablet TAKE 1 TABLET BY MOUTH TWICE A DAY  . MAGNESIUM PO Take by mouth daily.  . Melatonin 3 MG TABS Take 10 mg by mouth at bedtime.   . Multiple Vitamin (MULTIVITAMIN) tablet Take 1 tablet by mouth daily.  . naproxen sodium (ANAPROX) 220 MG  tablet Take 440 mg by mouth as needed.   Marland Kitchen POTASSIUM PO Take by mouth daily.  . primidone (MYSOLINE) 50 MG tablet TAKE ONE TABLET BY MOUTH EVERY NIGHT AT BEDTIME  . propranolol (INDERAL) 40 MG tablet TAKE 1 TABLET BY MOUTH DAILY  . vitamin B-12 (CYANOCOBALAMIN) 1000 MCG tablet Take 500 mcg by mouth daily.    No current facility-administered medications on file prior to visit.     Review of Systems Per HPI unless specifically indicated in ROS section     Objective:    BP 136/86   Pulse 80   Temp 98.3 F (36.8 C) (Oral)   Wt 221 lb 4 oz (100.4 kg)   LMP 06/01/2013   SpO2 98%   BMI 34.65 kg/m   Wt Readings from Last 3 Encounters:  08/25/16 221 lb 4 oz (100.4 kg)  08/14/16 219 lb (99.3 kg)  07/15/16 212 lb 12.8 oz (96.5 kg)    Physical Exam  Constitutional: She appears well-developed and well-nourished. No distress.  HENT:  Head: Normocephalic and atraumatic.  Right Ear: Hearing, tympanic membrane, external ear and ear canal normal.  Left Ear: Hearing, tympanic membrane, external ear and ear canal normal.  Nose: No mucosal edema or rhinorrhea. Right sinus exhibits no maxillary sinus tenderness and no frontal  sinus tenderness. Left sinus exhibits no maxillary sinus tenderness and no frontal sinus tenderness.  Mouth/Throat: Uvula is midline and mucous membranes are normal. Posterior oropharyngeal erythema present. No oropharyngeal exudate, posterior oropharyngeal edema or tonsillar abscesses.  Eyes: Conjunctivae and EOM are normal. Pupils are equal, round, and reactive to light. No scleral icterus.  Neck: Normal range of motion. Neck supple.  Cardiovascular: Normal rate, regular rhythm, normal heart sounds and intact distal pulses.   No murmur heard. Pulmonary/Chest: Effort normal and breath sounds normal. No respiratory distress. She has no wheezes. She has no rales.  Dry hacking cough present but lungs overall clear  Lymphadenopathy:    She has no cervical adenopathy.  Skin:  Skin is warm and dry. No rash noted.  Nursing note and vitals reviewed.  Results for orders placed or performed in visit on 08/14/16  CBC with Differential/Platelet  Result Value Ref Range   WBC 4.8 3.8 - 10.8 K/uL   RBC 4.04 3.80 - 5.10 MIL/uL   Hemoglobin 11.4 (L) 11.7 - 15.5 g/dL   HCT 35.5 35.0 - 45.0 %   MCV 87.9 80.0 - 100.0 fL   MCH 28.2 27.0 - 33.0 pg   MCHC 32.1 32.0 - 36.0 g/dL   RDW 15.4 (H) 11.0 - 15.0 %   Platelets 262 140 - 400 K/uL   MPV 9.4 7.5 - 12.5 fL   Neutro Abs 2,784 1,500 - 7,800 cells/uL   Lymphs Abs 1,440 850 - 3,900 cells/uL   Monocytes Absolute 480 200 - 950 cells/uL   Eosinophils Absolute 96 15 - 500 cells/uL   Basophils Absolute 0 0 - 200 cells/uL   Neutrophils Relative % 58 %   Lymphocytes Relative 30 %   Monocytes Relative 10 %   Eosinophils Relative 2 %   Basophils Relative 0 %   Smear Review Criteria for review not met   Comprehensive metabolic panel  Result Value Ref Range   Sodium 137 135 - 146 mmol/L   Potassium 4.2 3.5 - 5.3 mmol/L   Chloride 103 98 - 110 mmol/L   CO2 23 20 - 31 mmol/L   Glucose, Bld 97 65 - 99 mg/dL   BUN 8 7 - 25 mg/dL   Creat 0.62 0.50 - 1.05 mg/dL   Total Bilirubin 0.4 0.2 - 1.2 mg/dL   Alkaline Phosphatase 101 33 - 130 U/L   AST 32 10 - 35 U/L   ALT 29 6 - 29 U/L   Total Protein 6.2 6.1 - 8.1 g/dL   Albumin 4.1 3.6 - 5.1 g/dL   Calcium 8.8 8.6 - 10.4 mg/dL  Ferritin  Result Value Ref Range   Ferritin 7 (L) 10 - 232 ng/mL  IBC panel  Result Value Ref Range   UIBC 404 (H) 125 - 400 ug/dL   TIBC 468 (H) 250 - 450 ug/dL   %SAT 14 11 - 50 %  Iron  Result Value Ref Range   Iron 64 45 - 160 ug/dL      Assessment & Plan:   Problem List Items Addressed This Visit    Acute upper respiratory infection - Primary    Anticipate acute bronchitis with laryngitis. Given progression and worsening, cover for atypical infection with zpack. Prednisone/tussionex Rx provided as well. Update if not improving with  treatment. Pt agrees with plan.       Relevant Medications   azithromycin (ZITHROMAX) 250 MG tablet   Iron deficiency anemia    Discussed ferrous sulfate dosing and  optimal administration. Pt denies any recent bleeding. Pt has not completed colon cancer screening - update iFOB.       Relevant Medications   ferrous sulfate 325 (65 FE) MG tablet   Other Relevant Orders   Fecal occult blood, imunochemical    Other Visit Diagnoses    Acute laryngitis           Follow up plan: Return if symptoms worsen or fail to improve.  Ria Bush, MD

## 2016-08-25 NOTE — Progress Notes (Signed)
Pre visit review using our clinic review tool, if applicable. No additional management support is needed unless otherwise documented below in the visit note. 

## 2016-08-25 NOTE — Assessment & Plan Note (Signed)
Anticipate acute bronchitis with laryngitis. Given progression and worsening, cover for atypical infection with zpack. Prednisone/tussionex Rx provided as well. Update if not improving with treatment. Pt agrees with plan.

## 2016-09-22 ENCOUNTER — Ambulatory Visit (INDEPENDENT_AMBULATORY_CARE_PROVIDER_SITE_OTHER): Payer: 59 | Admitting: Psychology

## 2016-09-22 DIAGNOSIS — F4323 Adjustment disorder with mixed anxiety and depressed mood: Secondary | ICD-10-CM | POA: Diagnosis not present

## 2016-09-28 ENCOUNTER — Other Ambulatory Visit: Payer: Self-pay | Admitting: Neurology

## 2016-10-06 ENCOUNTER — Ambulatory Visit (INDEPENDENT_AMBULATORY_CARE_PROVIDER_SITE_OTHER): Payer: 59 | Admitting: Psychology

## 2016-10-06 DIAGNOSIS — F331 Major depressive disorder, recurrent, moderate: Secondary | ICD-10-CM | POA: Diagnosis not present

## 2016-10-22 ENCOUNTER — Ambulatory Visit (INDEPENDENT_AMBULATORY_CARE_PROVIDER_SITE_OTHER): Payer: 59 | Admitting: Psychology

## 2016-10-22 DIAGNOSIS — F449 Dissociative and conversion disorder, unspecified: Secondary | ICD-10-CM

## 2016-10-28 ENCOUNTER — Ambulatory Visit (INDEPENDENT_AMBULATORY_CARE_PROVIDER_SITE_OTHER)
Admission: RE | Admit: 2016-10-28 | Discharge: 2016-10-28 | Disposition: A | Discharge status: Home / Self Care | Payer: Medicare Other | Source: Ambulatory Visit | Attending: Primary Care | Admitting: Primary Care

## 2016-10-28 ENCOUNTER — Encounter: Payer: Self-pay | Admitting: Primary Care

## 2016-10-28 ENCOUNTER — Ambulatory Visit (INDEPENDENT_AMBULATORY_CARE_PROVIDER_SITE_OTHER): Payer: Medicare Other | Admitting: Primary Care

## 2016-10-28 VITALS — BP 142/92 | HR 74 | Temp 98.2°F | Ht 67.0 in | Wt 216.8 lb

## 2016-10-28 DIAGNOSIS — J069 Acute upper respiratory infection, unspecified: Secondary | ICD-10-CM

## 2016-10-28 MED ORDER — ALBUTEROL SULFATE HFA 108 (90 BASE) MCG/ACT IN AERS: 2.0000 | INHALATION_SPRAY | Freq: Four times a day (QID) | RESPIRATORY_TRACT | 0 refills | Status: DC | PRN

## 2016-10-28 MED ORDER — BENZONATATE 200 MG PO CAPS: 200.0000 mg | ORAL_CAPSULE | Freq: Three times a day (TID) | ORAL | 0 refills | Status: DC | PRN

## 2016-10-28 MED ORDER — AMOXICILLIN-POT CLAVULANATE 875-125 MG PO TABS: 1.0000 | ORAL_TABLET | Freq: Two times a day (BID) | ORAL | 0 refills | Status: DC

## 2016-10-28 NOTE — Patient Instructions (Addendum)
Complete xray(s) prior to leaving today. I will notify you of your results once received.  Shortness of breath/Wheezing/Cough: Use the albuterol inhaler. Inhale 2 puffs into the lungs every 6 to 8 hours as needed for wheezing and/or shortness of breath.   You may take Benzonatate capsules for cough. Take 1 capsule by mouth three times daily as needed for cough.  Start Augmentin antibiotics. Take 1 tablet by mouth twice daily for 10 days.  Ensure you are staying hydrated and rest.  It was a pleasure to see you today!

## 2016-10-28 NOTE — Progress Notes (Signed)
Subjective:    Patient ID: Bretta Bang, female    DOB: [birth year], 56 y.o.   MRN: [id number]  HPI  Ms. Klar is a 56 year old female who presents today with a chief complaint of cough. She also reports chest congestion, shortness of breath, sinus pressure, wheezing. Her symptoms began 3 weeks ago. Her cough is productive with dark gold color. She denies fevers, body aches. She was last treated in January 2018 for acute bronchitis with antibiotics and steroids with complete resolve. She's taken Mucinex and Delsym OTC for her symptoms without improvement. Overall she's feeling worse.   Review of Systems  Constitutional: Negative for chills and fever.  HENT: Positive for congestion and sinus pressure.   Respiratory: Positive for cough, shortness of breath and wheezing.   Cardiovascular: Negative for chest pain.       Past Medical History:  Diagnosis Date  . Amblyopia   . Benign essential tremor    worsened by anxiety  . Chronic pain due to injury    at R ankle due to ankle fracture  . Depression    followed by psych  . History of hypertension   . HLD (hyperlipidemia)   . Imbalance    likely multifactorial (s/p balance training at [facility] 08/2010) ?CMT  . Iron deficiency anemia   . Right ankle pain 2003   trimalleolar fx s/p fusion  . Seizure (HCC)    focal, ex vacuo ventriculomegaly?, pending neuro w/u with sleep deprived EEG     Social History   Social History  . Marital status: Divorced    Spouse name: N/A  . Number of children: 1  . Years of education: N/A   Occupational History  . Disabled     depression  . DISABILITY Unemployed   Social History Main Topics  . Smoking status: Former Smoker    Packs/day: 1.00    Years: 20.00    Quit date: 08/12/1995  . Smokeless tobacco: Never Used  . Alcohol use No     Comment: None for > 1 year  . Drug use: No  . Sexual activity: Not on file   Other Topics Concern  . Not on file   Social History Narrative   No caffeine.  Divorced. 1 child. 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, although endorses that she falls a lot     Past Surgical History:  Procedure Laterality Date  . ANKLE FUSION  02/2004   Right-50% disability   . CESAREAN SECTION    . hospitalization  08/2011   syncope thought 2/2 seizure, MRI - Hyperintensity in the cerebral white matter bilaterally is nonspecific, started on Keppra  . ORIF ANKLE FRACTURE  06/2003   Right    Family History  Problem Relation Age of Onset  . Atrial fibrillation Father   . Hypertension Father   . Osteoarthritis Father   . Aortic aneurysm Father   . Depression Mother   . Hepatitis Brother     Hep. C  . Anemia Sister   . Arrhythmia Mother 37    Dx with VTach after having sudden collapse, did NOT have h/o AMI's     No Known Allergies  Current Outpatient Prescriptions on File Prior to Visit  Medication Sig Dispense Refill  . acetaminophen (TYLENOL) 500 MG tablet Take 500 mg by mouth as needed.     . Ascorbic Acid (VITAMIN C) 1000 MG tablet Take 1,000 mg  by mouth daily.    Marland Kitchen aspirin EC 81 MG tablet Take 81 mg by mouth every evening.      . DULoxetine (CYMBALTA) 60 MG capsule Take 60 mg by mouth 2 (two) times daily.     Marland Kitchen escitalopram (LEXAPRO) 10 MG tablet Take 10 mg by mouth daily.     . ferrous sulfate 325 (65 FE) MG tablet Take 325 mg by mouth daily with breakfast.    . gabapentin (NEURONTIN) 300 MG capsule Take 600 mg by mouth 2 (two) times daily.     Marland Kitchen lamoTRIgine (LAMICTAL) 150 MG tablet TAKE 1 TABLET BY MOUTH TWICE A DAY 180 tablet 1  . MAGNESIUM PO Take by mouth daily.    . Melatonin 3 MG TABS Take 10 mg by mouth at bedtime.     . Multiple Vitamin (MULTIVITAMIN) tablet Take 1 tablet by mouth daily.    . naproxen sodium (ANAPROX) 220 MG tablet Take 440 mg by mouth as needed.     Marland Kitchen POTASSIUM PO Take by mouth daily.    . primidone (MYSOLINE) 50 MG tablet TAKE ONE TABLET BY MOUTH EVERY  NIGHT AT BEDTIME 90 tablet 3  . propranolol (INDERAL) 40 MG tablet TAKE 1 TABLET BY MOUTH DAILY 90 tablet 0  . vitamin B-12 (CYANOCOBALAMIN) 1000 MCG tablet Take 500 mcg by mouth daily.      No current facility-administered medications on file prior to visit.     BP (!) 142/92   Pulse 74   Temp 98.2 F (36.8 C) (Oral)   Ht 5\' 7"  (1.702 m)   Wt 216 lb 12.8 oz (98.3 kg)   LMP 06/01/2013   SpO2 98%   BMI 33.96 kg/m    Objective:   Physical Exam  Constitutional: She appears well-nourished. She appears ill.  HENT:  Right Ear: Tympanic membrane and ear canal normal.  Left Ear: Tympanic membrane and ear canal normal.  Nose: Mucosal edema present. Right sinus exhibits no maxillary sinus tenderness and no frontal sinus tenderness. Left sinus exhibits no maxillary sinus tenderness and no frontal sinus tenderness.  Mouth/Throat: Oropharynx is clear and moist.  Eyes: Conjunctivae are normal.  Neck: Neck supple.  Cardiovascular: Normal rate and regular rhythm.   Pulmonary/Chest: Effort normal. She has wheezes in the right upper field and the left upper field. She has rhonchi in the right upper field and the left upper field. She has no rales.  Lymphadenopathy:    She has no cervical adenopathy.  Skin: Skin is warm and dry.          Assessment & Plan:  Acute Bronchitis:  Cough, congestion, fatigue x 3 weeks. Treated for same symptoms in January 2018. Exam today suggestive of bronchitis, does appear acutely ill. Check chest xray today given recurrent symptoms. Rx for Augmentin, tessalon pearls, albuterol inhaler sent to pharmacy. Consider further work up if symptoms continue to reoccur.   Morrie Sheldon, NP

## 2016-10-28 NOTE — Progress Notes (Signed)
Pre visit review using our clinic review tool, if applicable. No additional management support is needed unless otherwise documented below in the visit note. 

## 2016-11-03 ENCOUNTER — Other Ambulatory Visit: Payer: Self-pay | Admitting: Neurology

## 2016-11-21 ENCOUNTER — Ambulatory Visit (INDEPENDENT_AMBULATORY_CARE_PROVIDER_SITE_OTHER): Payer: 59 | Admitting: Psychology

## 2016-11-21 DIAGNOSIS — F331 Major depressive disorder, recurrent, moderate: Secondary | ICD-10-CM

## 2016-12-05 ENCOUNTER — Ambulatory Visit (INDEPENDENT_AMBULATORY_CARE_PROVIDER_SITE_OTHER): Payer: 59 | Admitting: Psychology

## 2016-12-05 DIAGNOSIS — F331 Major depressive disorder, recurrent, moderate: Secondary | ICD-10-CM

## 2016-12-24 ENCOUNTER — Ambulatory Visit (INDEPENDENT_AMBULATORY_CARE_PROVIDER_SITE_OTHER): Payer: 59 | Admitting: Psychology

## 2016-12-24 DIAGNOSIS — F331 Major depressive disorder, recurrent, moderate: Secondary | ICD-10-CM | POA: Diagnosis not present

## 2016-12-24 DIAGNOSIS — F449 Dissociative and conversion disorder, unspecified: Secondary | ICD-10-CM | POA: Diagnosis not present

## 2017-01-21 ENCOUNTER — Ambulatory Visit (INDEPENDENT_AMBULATORY_CARE_PROVIDER_SITE_OTHER): Payer: 59 | Admitting: Psychology

## 2017-01-21 DIAGNOSIS — F4481 Dissociative identity disorder: Secondary | ICD-10-CM

## 2017-01-21 DIAGNOSIS — F4310 Post-traumatic stress disorder, unspecified: Secondary | ICD-10-CM | POA: Diagnosis not present

## 2017-01-26 ENCOUNTER — Encounter: Payer: Self-pay | Admitting: Family Medicine

## 2017-01-26 ENCOUNTER — Ambulatory Visit (INDEPENDENT_AMBULATORY_CARE_PROVIDER_SITE_OTHER): Payer: Medicare Other | Admitting: Family Medicine

## 2017-01-26 VITALS — BP 128/82 | HR 70 | Temp 98.2°F | Ht 67.0 in | Wt 211.5 lb

## 2017-01-26 DIAGNOSIS — J019 Acute sinusitis, unspecified: Secondary | ICD-10-CM

## 2017-01-26 DIAGNOSIS — H6692 Otitis media, unspecified, left ear: Secondary | ICD-10-CM | POA: Insufficient documentation

## 2017-01-26 MED ORDER — GUAIFENESIN-CODEINE 100-10 MG/5ML PO SYRP: 5.0000 mL | ORAL_SOLUTION | Freq: Two times a day (BID) | ORAL | 0 refills | Status: DC | PRN

## 2017-01-26 MED ORDER — FLUTICASONE PROPIONATE 50 MCG/ACT NA SUSP: 2.0000 | Freq: Every day | NASAL | 1 refills | Status: DC

## 2017-01-26 MED ORDER — AMOXICILLIN-POT CLAVULANATE 875-125 MG PO TABS: 1.0000 | ORAL_TABLET | Freq: Two times a day (BID) | ORAL | 0 refills | Status: AC

## 2017-01-26 NOTE — Patient Instructions (Addendum)
Return at your convenience for physical. I do think you have sinus infection with left ear infection. Treat with augmentin antibiotic twice daily with food.  Push fluids and rest. Try flonase for nasal congestion. May try neti pot.

## 2017-01-26 NOTE — Assessment & Plan Note (Signed)
Anticipate bacterial with concomitant AOM given clinical signs. Treat with augmentin 10 d course. cheratussin for cough. Update if not improving with treatment.

## 2017-01-26 NOTE — Assessment & Plan Note (Signed)
Treat with augmentin course (to cover sinusitis component as well). Pt agrees with plan.

## 2017-01-26 NOTE — Progress Notes (Signed)
BP 128/82   Pulse 70   Temp 98.2 F (36.8 C)   Ht 5\' 7"  (1.702 m)   Wt 211 lb 8 oz (95.9 kg)   LMP 06/01/2013   SpO2 97%   BMI 33.13 kg/m    CC: facial pain ?sinusitis Subjective:    Patient ID: Jamie Patterson, female    DOB: 1961/07/31, 56 y.o.   MRN: [id number]  HPI: Jamie Patterson is a 56 y.o. female presenting on 01/26/2017 for Acute Visit (face pain--teeth pain--left ear--cough x 5 days)   5-6 d h/o L facial pain, tooth pain, muffled hearing, earache all on left side. Initial neck pain but that has improved. Chills yesterday.   No fevers,  Has tried pseudophed, nyquil, mucinex D.  No sick contacts at home.  No h/o asthma or pneumonia.   Father currently at [facility] - recovering from urosepsis.   Due for colon cancer screen Last pap smear 04/2015.  Relevant past medical, surgical, family and social history reviewed and updated as indicated. Interim medical history since our last visit reviewed. Allergies and medications reviewed and updated. Outpatient Medications Prior to Visit  Medication Sig Dispense Refill  . acetaminophen (TYLENOL) 500 MG tablet Take 500 mg by mouth as needed.     . Ascorbic Acid (VITAMIN C) 1000 MG tablet Take 1,000 mg by mouth daily.    Marland Kitchen aspirin EC 81 MG tablet Take 81 mg by mouth every evening.      . DULoxetine (CYMBALTA) 60 MG capsule Take 60 mg by mouth 2 (two) times daily.     Marland Kitchen escitalopram (LEXAPRO) 10 MG tablet Take 10 mg by mouth daily.     . ferrous sulfate 325 (65 FE) MG tablet Take 325 mg by mouth daily with breakfast.    . gabapentin (NEURONTIN) 300 MG capsule Take 600 mg by mouth 2 (two) times daily.     Marland Kitchen lamoTRIgine (LAMICTAL) 150 MG tablet TAKE 1 TABLET BY MOUTH TWICE A DAY 180 tablet 1  . MAGNESIUM PO Take by mouth daily.    . Melatonin 3 MG TABS Take 10 mg by mouth at bedtime.     . Multiple Vitamin (MULTIVITAMIN) tablet Take 1 tablet by mouth daily.    . naproxen sodium (ANAPROX) 220 MG tablet Take 440 mg by mouth as needed.      Marland Kitchen POTASSIUM PO Take by mouth daily.    . primidone (MYSOLINE) 50 MG tablet TAKE ONE TABLET BY MOUTH EVERY NIGHT AT BEDTIME 90 tablet 3  . propranolol (INDERAL) 40 MG tablet TAKE 1 TABLET BY MOUTH DAILY 90 tablet 1  . vitamin B-12 (CYANOCOBALAMIN) 1000 MCG tablet Take 500 mcg by mouth daily.     Marland Kitchen albuterol (PROVENTIL HFA;VENTOLIN HFA) 108 (90 Base) MCG/ACT inhaler Inhale 2 puffs into the lungs every 6 (six) hours as needed for wheezing or shortness of breath. 1 Inhaler 0  . amoxicillin-clavulanate (AUGMENTIN) 875-125 MG tablet Take 1 tablet by mouth 2 (two) times daily. 20 tablet 0  . benzonatate (TESSALON) 200 MG capsule Take 1 capsule (200 mg total) by mouth 3 (three) times daily as needed for cough. 30 capsule 0   No facility-administered medications prior to visit.      Per HPI unless specifically indicated in ROS section below Review of Systems     Objective:    BP 128/82   Pulse 70   Temp 98.2 F (36.8 C)   Ht 5\' 7"  (1.702 m)   Wt  211 lb 8 oz (95.9 kg)   LMP 06/01/2013   SpO2 97%   BMI 33.13 kg/m   Wt Readings from Last 3 Encounters:  01/26/17 211 lb 8 oz (95.9 kg)  10/28/16 216 lb 12.8 oz (98.3 kg)  08/25/16 221 lb 4 oz (100.4 kg)    Physical Exam  Constitutional: She appears well-developed and well-nourished. No distress.  HENT:  Head: Normocephalic and atraumatic.  Right Ear: Hearing, tympanic membrane, external ear and ear canal normal.  Left Ear: Hearing, external ear and ear canal normal. Tympanic membrane is erythematous and bulging.  Nose: Mucosal edema present. No rhinorrhea. Right sinus exhibits no maxillary sinus tenderness and no frontal sinus tenderness. Left sinus exhibits no maxillary sinus tenderness and no frontal sinus tenderness.  Mouth/Throat: Uvula is midline and mucous membranes are normal. Posterior oropharyngeal edema and posterior oropharyngeal erythema present. No oropharyngeal exudate or tonsillar abscesses.  L TM erythematous and bulging  with preserved light reflex Purulent nasal mucous present bilateral nares  Eyes: Conjunctivae and EOM are normal. Pupils are equal, round, and reactive to light. No scleral icterus.  Neck: Normal range of motion. Neck supple.  Cardiovascular: Normal rate, regular rhythm, normal heart sounds and intact distal pulses.   No murmur heard. Pulmonary/Chest: Effort normal and breath sounds normal. No respiratory distress. She has no wheezes. She has no rales.  Lymphadenopathy:    She has no cervical adenopathy.  Skin: Skin is warm and dry. No rash noted.  Nursing note and vitals reviewed.     Assessment & Plan:  She is overdue for preventative healthcare. I discussed this with patient, asked her to return for CPE as soon as able.  Problem List Items Addressed This Visit    Acute left otitis media    Treat with augmentin course (to cover sinusitis component as well). Pt agrees with plan.       Relevant Medications   amoxicillin-clavulanate (AUGMENTIN) 875-125 MG tablet   Acute sinusitis - Primary    Anticipate bacterial with concomitant AOM given clinical signs. Treat with augmentin 10 d course. cheratussin for cough. Update if not improving with treatment.       Relevant Medications   amoxicillin-clavulanate (AUGMENTIN) 875-125 MG tablet   fluticasone (FLONASE) 50 MCG/ACT nasal spray   guaiFENesin-codeine (CHERATUSSIN AC) 100-10 MG/5ML syrup       Follow up plan: Return if symptoms worsen or fail to improve.  [name], MD

## 2017-02-03 ENCOUNTER — Ambulatory Visit (INDEPENDENT_AMBULATORY_CARE_PROVIDER_SITE_OTHER): Payer: 59 | Admitting: Psychology

## 2017-02-03 DIAGNOSIS — F449 Dissociative and conversion disorder, unspecified: Secondary | ICD-10-CM | POA: Diagnosis not present

## 2017-02-03 DIAGNOSIS — F331 Major depressive disorder, recurrent, moderate: Secondary | ICD-10-CM | POA: Diagnosis not present

## 2017-02-10 ENCOUNTER — Telehealth: Payer: Self-pay | Admitting: *Deleted

## 2017-02-10 MED ORDER — DOXYCYCLINE HYCLATE 100 MG PO TABS: 100.0000 mg | ORAL_TABLET | Freq: Two times a day (BID) | ORAL | 0 refills | Status: DC

## 2017-02-10 NOTE — Telephone Encounter (Signed)
Spoke to pt who states she "was seen a couple weeks ago." She has completed the abx, and her s/s subsided, but have since returned. She is wanting to know if she "can have something else sent in" or if she is needing an additional office visit, which she is wanting to avoid, if possible. pls advise

## 2017-02-10 NOTE — Telephone Encounter (Signed)
Pt is aware as instructed 

## 2017-02-10 NOTE — Telephone Encounter (Signed)
I have sent in doxy 10 d course. Avoid sun as this antibiotic will increase sun burn chances Schedule f/u if symptoms return after this.

## 2017-02-11 NOTE — Progress Notes (Signed)
Jamie Patterson was seen today in neurologic consultation at the request of [name], MD and [name].  Since [name] is no longer with the practice, I will be resuming the patient's care.  I have reviewed the patient's prior medical records.    The patient has had 2 episodes of what sound like seizure.  These consisted of a vocalization, falling and generalized shaking.  An MRI of the brain was done and revealed multiple T2 hyperintensities, with some concern for demyelinating disease.  She subsequently had a lumbar puncture that was negative for oligoclonal bands.  She was placed on Keppra in addition to the Lamictal that she was already on for depression.  She subsequently had a sleep deprived EEG that was negative and therefore her Keppra was weaned and her Lamictal was increased, in hopes that that could be used as monotherapy for potential seizure and depression.  She remains on Wellbutrin, but she did talk with her psychiatrist about this.  They felt that the benefits outweigh the risks.  She was also placed on Neurontin since last visit.  She has only been on it since Friday.  She was on this for anxiety.  She does not know if it is helpful yet.  The patient also has tremor and after talking with [name], the patients psychiatrist, [name] started the patient on propranolol 20 mg, 2 tablets times a day.  The patient reports that it has helped dramatically, at least 75% helpful.  She reports that tremor began after a w/d from oxycontin and valium (she went off of it "cold [place]" and did not realize that she was "addicted" and should wean it).  She may have had tremor before then but worse after that.  She could feel the tremor inside and that is much better. She is no longer having trouble with activities of daily living like she was. She still has some dizziness if she stands up quickly, bends over or turns quickly.  She has adjusted to this fairly well.  She continues to complain of chronic daily  headache.  The headaches are holocephalic.  She has daily nausea and she is not sure if that is from the headache from the multiple medications.  She does not have vomiting.  There has been no photophobia.  She does have a history of migraine.  06/14/13 update:  The patient is following up today regarding her essential tremor.  She is currently on propranolol 40 mg twice per day.  She continues to have some dizziness if she stands up quickly, bends over or turns.  She has had no seizures since our last visit.  She does remain on Wellbutrin, and understands that this lower seizure threshold.  She has talked about this with her psychiatrist and they think that the benefits outweigh risks.  She is very concerned about memory changes.  She is having word finding difficulty.  She admits to continued difficulty with uncontrolled depression, but states that she is not suicidal or homicidal.  12/19/13 update:  The patient is following up on essential tremor.  She is currently on Mysoline, and last visit we tried to stop the propranolol because of dizziness.  Unfortunately, tremor increased and we had to restart the propranolol but restarted it at daily dosing instead of twice a day dosing.  She is no longer dizzy and the tremor is better.  She has some "jerking" and twitching spells; is just one single jerk of an arm or leg.  She is on gabapentin, 300 mg, 1 in the AM and 2 at night.  She is on that for psychiatric reasons.  Not sure it is helping.  Is seeing psychiatry but insurance will no longer pay for counseling, and she cannot afford it, although she thought it was beneficial.  She also has a history of seizure and is on Lamictal.  Unfortunately, she remains on Wellbutrin for depression.  She has been seizure free, however.  She still complains of memory issues.  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 She was supposed to have neuropsych testing completed before this  visit but it was never scheduled.    06/21/14 update:  Pt returns today for follow-up regarding tremor.  She is on a combination of low-dose propranolol, 40 mg daily, and primidone 50 mg nightly.  Last visit, the patient was describing what sounded like myoclonus.  I thought that perhaps it was secondary to her Neurontin, which is being prescribed by psychiatry.  It was decreased and the jerking went away.  She went off of the Wellbutrin, changed to cymbalta and increased it a week ago.  She is still depressed and asks about recommendations for counselors.  She had great luck with Dr. Lendell CapriceSullivan but it was just too far to travel for routine f/u.  She did have neuropsych testing with Dr. Lendell CapriceSullivan that demonstrated major depression, social phobia.  All memory testing was normal and no evidence of amnestic memory d/o.  No evidence of neurodegenerative process.  12/19/14 update:  Pt returns for follow up.  She is on propranolol 40 mg and propranolol 50 q hs.  Tremor has perhaps increased some, but she thinks maybe that is due to increased anxiety.  She really does not want to change her medications.  She does have a hx of depression.  [name] just doubled her cymbalta to 60 mg and her prozac was d/c.  It has only been 2 weeks.  She admits that her anxiety and depression have been fairly disabling.  She is not suicidal or homicidal.  She has not even been back to her primary care physician in over a year and a half.  She likes him but overall would rather have a female provider because of things that she has experienced in the past, and because she would rather have 1 provider who did her female exams.  She asks me about this today.  She d/c her BP med and her cholesterol med because she knew she had to go back to the dr for a refill and she was anxious about that.  She still does not have a Veterinary surgeoncounselor.  06/21/15 update:  The patient returns today for follow-up.  She has a history of essential tremor and is on  propranolol, 40 mg and primidone, 50 mg daily.  Tremor may be a little worse but "not bad like it was."  She reports that she has had more headaches; has a long hx of such.  Always R sided.  Is same headache she reports she saw Dr. Scarlette ShortsAdleman years ago.  She states that is pounding in nature.  No n/v.  She has photophobia but no phonophobia.  She takes aleve or advil.  She has noted that she has had to take that 4-5 days/week over the last few weeks.  States that it has been increased over the last month or so.  She continues to see [name] for her depression.  10/19/15  update:  The patient returns today for follow-up.  She has a history of essential tremor and is on propranolol, 40 mg and primidone, 50 mg daily.  She continues to see [name] for her depression.  Last visit, she was complaining about increased headache, but she did refuse a daily prophylactic medication even though she likely needed one.  I did give her a course of Medrol to see if I could break up the acute overlying headache that she was having.  She does state that the headaches had gone away for a while but are back again over the last month and they aren't as bad as it was.  She states that it made her feel anxious and just "weird."   She still doesn't want a preventative.  She states that these may be more stress as are in the bilateral frontal region and worse in the evening.  She is taking excessive advil on a daily basis.   She has had lexapro added to her lamictal and cymbalta for depression.  She is also on gabapentin but is only on per day.    03/14/16 update:  The patient returns today for follow-up.  She remains on propranolol, 40 mg daily and primidone, 50 mg nightly for essential tremor.   She has noted a bit more tremor but isn't sure she really wants to go up on medication.  She does have a history of chronic migraine, but has not wanted preventative medication for this.  These have been better since she quit taking advil daily.   She did end up going to see Cristen Saffo but she felt that she wasn't the right person for her and she cannot find someone now who takes her insurance.    08/14/16 update:  The patient returns today for follow-up.  She is still on primidone, 50 mg daily for essential tremor.  She is also on propranolol, 40 mg daily.  Tremor has been about the same.  C/O cramping in the legs and feet.  States that having some restlessness in the feet and legs but is intermittent.  On gabapentin 600 mg bid for psychiatric reasons.    She is still seeing [name] for her depression.  She denies suicidal or homicidal ideation.  She is now seeing Dr. Evalina FieldJane Perrin and really likes her and is pleased with that.    02/12/17 update:  Pt seen in f/u.  Remains on primidone 50 mg daily and propranolol 40 mg daily for ET.  Tremor has been stable.  Last visit, we checked labs because she was c/o RLS sx's.  She was noted to be iron deficient.  Iron supplementation recommended.  She is taking ferrous sulfate - 325 mg q day.  Her RLS is better.   She f/u with PCP in that regard as well and I reviewed his records.  She is having paresthesias in the hands and thinks that her CTS is getting bad.  She is still seeing Evalina FieldJane Perrin and likes her.  She has had 2-3 falls in last 18 months.   Doesn't know how falls are happening.  Some may be bad ankle as R ankle is fused.  [name] did EMG several years ago and was unremarkable but still thought that PN played a role (small fiber).  Headaches increased lately but on abx for sinus infection   PREVIOUS MEDICATIONS: n/a  All:  No Known Allergies  Current Outpatient Prescriptions on File Prior to Visit  Medication Sig Dispense  Refill  . acetaminophen (TYLENOL) 500 MG tablet Take 500 mg by mouth as needed.     . Ascorbic Acid (VITAMIN C) 1000 MG tablet Take 1,000 mg by mouth daily.    Marland Kitchen aspirin EC 81 MG tablet Take 81 mg by mouth every evening.      . DULoxetine (CYMBALTA) 60 MG capsule Take 60 mg by  mouth 2 (two) times daily.     Marland Kitchen escitalopram (LEXAPRO) 10 MG tablet Take 10 mg by mouth daily.     . ferrous sulfate 325 (65 FE) MG tablet Take 325 mg by mouth daily with breakfast.    . gabapentin (NEURONTIN) 300 MG capsule Take 600 mg by mouth 2 (two) times daily.     Marland Kitchen lamoTRIgine (LAMICTAL) 150 MG tablet TAKE 1 TABLET BY MOUTH TWICE A DAY 180 tablet 1  . MAGNESIUM PO Take by mouth daily.    . Melatonin 3 MG TABS Take 10 mg by mouth at bedtime.     . Multiple Vitamin (MULTIVITAMIN) tablet Take 1 tablet by mouth daily.    . naproxen sodium (ANAPROX) 220 MG tablet Take 440 mg by mouth as needed.     Marland Kitchen POTASSIUM PO Take by mouth daily.    . primidone (MYSOLINE) 50 MG tablet TAKE ONE TABLET BY MOUTH EVERY NIGHT AT BEDTIME 90 tablet 3  . propranolol (INDERAL) 40 MG tablet TAKE 1 TABLET BY MOUTH DAILY 90 tablet 1  . vitamin B-12 (CYANOCOBALAMIN) 1000 MCG tablet Take 500 mcg by mouth daily.      No current facility-administered medications on file prior to visit.    Past Medical History:  Diagnosis Date  . Amblyopia   . Benign essential tremor    worsened by anxiety  . Chronic pain due to injury    at R ankle due to ankle fracture  . Depression    followed by psych  . History of hypertension   . HLD (hyperlipidemia)   . Imbalance    likely multifactorial (s/p balance training at [facility] 08/2010) ?CMT  . Iron deficiency anemia   . Right ankle pain 2003   trimalleolar fx s/p fusion  . Seizure (HCC)    focal, ex vacuo ventriculomegaly?, pending neuro w/u with sleep deprived EEG   Past Surgical History:  Procedure Laterality Date  . ANKLE FUSION  02/2004   Right-50% disability   . CESAREAN SECTION    . hospitalization  08/2011   syncope thought 2/2 seizure, MRI - Hyperintensity in the cerebral white matter bilaterally is nonspecific, started on Keppra  . ORIF ANKLE FRACTURE  06/2003   Right   Social History   Social History  . Marital status: Divorced    Spouse name: N/A  .  Number of children: 1  . Years of education: N/A   Occupational History  . Disabled     depression  . DISABILITY Unemployed   Social History Main Topics  . Smoking status: Former Smoker    Packs/day: 1.00    Years: 20.00    Quit date: 08/12/1995  . Smokeless tobacco: Never Used  . Alcohol use No     Comment: None for > 1 year  . Drug use: No  . Sexual activity: Not on file   Other Topics Concern  . Not on file   Social History Narrative   No caffeine. Divorced. 1 child. 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, although endorses that  she falls a lot     ROS:  A complete 10 system review of systems was obtained and was unremarkable apart from what is mentioned above.  PHYSICAL EXAMINATION:    Vitals:   02/12/17 1308  BP: 110/62  Pulse: 76    GEN:  Normal appears female in no acute distress.  Appears stated age.  Appears generally anxious HEENT:  Normocephalic, atraumatic. The mucous membranes are moist. The superficial temporal arteries are without ropiness or tenderness.  No occipital notch tenderness CV:  RRR Lungs:  CTAB Neck:  No bruits   NEUROLOGICAL: Orientation:  The patient is alert and oriented x 3.   Cranial nerves: There is good facial symmetry.  Extraocular muscles are intact and visual fields are full to confrontational testing. Speech is fluent and clear. Soft palate rises symmetrically and there is no tongue deviation. Hearing is intact to conversational tone. Tone: Tone is good throughout. Sensation: Sensation is intact to light touch throughout (facial, trunk, extremities).  Vibration is decreased distally.  Pinprick is not changed in stocking distribution. Coordination:  The patient has no difficulty with RAM's or FNF bilaterally.   Abnormal Movements:  There is some minimal tremor of the outstretched hands, left more than right.  She has a very irregular "tremor" today that is somewhat  nonphysiologic.   DTR:  2/4 at the bilateral biceps, triceps, brachioradialis, 3/4 at the bilateral patella. Motor: Strength is 5/5 in the bilateral upper and lower extremities.  Shoulder shrug is equal and symmetric. There is no pronator drift.  There are no fasciculations noted. Gait and Station: The patient ambulates without difficulty  Labs:   Wt Readings from Last 3 Encounters:  02/12/17 217 lb (98.4 kg)  01/26/17 211 lb 8 oz (95.9 kg)  10/28/16 216 lb 12.8 oz (98.3 kg)   Lab Results  Component Value Date   VITAMINB12 777 01/06/2012   Lab Results  Component Value Date   TSH 4.33 09/13/2013     Chemistry      Component Value Date/Time   NA 137 08/14/2016 1342   K 4.2 08/14/2016 1342   CL 103 08/14/2016 1342   CO2 23 08/14/2016 1342   BUN 8 08/14/2016 1342   CREATININE 0.62 08/14/2016 1342      Component Value Date/Time   CALCIUM 8.8 08/14/2016 1342   ALKPHOS 101 08/14/2016 1342   AST 32 08/14/2016 1342   ALT 29 08/14/2016 1342   BILITOT 0.4 08/14/2016 1342     Lab Results  Component Value Date   WBC 4.8 08/14/2016   HGB 11.4 (L) 08/14/2016   HCT 35.5 08/14/2016   MCV 87.9 08/14/2016   PLT 262 08/14/2016   Lab Results  Component Value Date   FERRITIN 7 (L) 08/14/2016      IMPRESSION/PLAN  1. Essential Tremor, likely exacerbated by significant anxiety/depression.  -continue primidone 50 mg along with propranolol 40 mg.  Don't want to increase propranolol due to depression 2.  Possible seizure years ago.  EEG has been normal, but clinically it sounds as if the patient had a seizure.   -has been off of wellbutrin for years and has been seizure free 3.  Chronic migraine/chronic daily headache.    -increased lately due to sinus issues and on abx currently 4.  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  Neuropsych testing was negative in this regard, demonstrating only major depression  -seeing Dr. [name] and Evalina Field 5.  ? Myoclonus    -resolved with  decreased gabapentin dose 6.  Hand paresthesias  -will do EMG.  Her L hand is little worse than the R but both are symptommatic.  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  RLS and LE cramping  -due to iron deficiency anemia.      -now on iron supplementation.  RLS improved after started supplementation 8.  Follow up is anticipated in the next 6 months, sooner should new neurologic issues arise.  Much greater than 50% of this visit was spent in counseling and coordinating care.  Total face to face time:  30 min

## 2017-02-12 ENCOUNTER — Encounter: Payer: Self-pay | Admitting: Neurology

## 2017-02-12 ENCOUNTER — Ambulatory Visit (INDEPENDENT_AMBULATORY_CARE_PROVIDER_SITE_OTHER): Payer: Medicare Other | Admitting: Neurology

## 2017-02-12 VITALS — BP 110/62 | HR 76 | Ht 67.0 in | Wt 217.0 lb

## 2017-02-12 DIAGNOSIS — G250 Essential tremor: Secondary | ICD-10-CM

## 2017-02-12 DIAGNOSIS — D509 Iron deficiency anemia, unspecified: Secondary | ICD-10-CM

## 2017-02-12 DIAGNOSIS — R202 Paresthesia of skin: Secondary | ICD-10-CM | POA: Diagnosis not present

## 2017-02-12 NOTE — Patient Instructions (Signed)
1. ELECTROMYOGRAM AND NERVE CONDUCTION STUDIES (EMG/NCS) INSTRUCTIONS  How to Prepare The neurologist conducting the EMG will need to know if you have certain medical conditions. Tell the neurologist and other EMG lab personnel if you: . Have a pacemaker or any other electrical medical device . Take blood-thinning medications . Have hemophilia, a blood-clotting disorder that causes prolonged bleeding Bathing Take a shower or bath shortly before your exam in order to remove oils from your skin. Don't apply lotions or creams before the exam.  What to Expect You'll likely be asked to change into a hospital gown for the procedure and lie down on an examination table. The following explanations can help you understand what will happen during the exam.  . Electrodes. The neurologist or a technician places surface electrodes at various locations on your skin depending on where you're experiencing symptoms. Or the neurologist may insert needle electrodes at different sites depending on your symptoms.  . Sensations. The electrodes will at times transmit a tiny electrical current that you may feel as a twinge or spasm. The needle electrode may cause discomfort or pain that usually ends shortly after the needle is removed. If you are concerned about discomfort or pain, you may want to talk to the neurologist about taking a short break during the exam.  . Instructions. During the needle EMG, the neurologist will assess whether there is any spontaneous electrical activity when the muscle is at rest - activity that isn't present in healthy muscle tissue - and the degree of activity when you slightly contract the muscle.  He or she will give you instructions on resting and contracting a muscle at appropriate times. Depending on what muscles and nerves the neurologist is examining, he or she may ask you to change positions during the exam.  After your EMG You may experience some temporary, minor bruising where the  needle electrode was inserted into your muscle. This bruising should fade within several days. If it persists, contact your primary care doctor.

## 2017-02-18 ENCOUNTER — Ambulatory Visit: Payer: 59 | Admitting: Psychology

## 2017-02-26 ENCOUNTER — Encounter: Payer: Self-pay | Admitting: Family Medicine

## 2017-02-26 ENCOUNTER — Ambulatory Visit (INDEPENDENT_AMBULATORY_CARE_PROVIDER_SITE_OTHER): Payer: Medicare Other | Admitting: Family Medicine

## 2017-02-26 VITALS — BP 120/80 | HR 94 | Temp 98.0°F

## 2017-02-26 DIAGNOSIS — J40 Bronchitis, not specified as acute or chronic: Secondary | ICD-10-CM

## 2017-02-26 MED ORDER — PREDNISONE 20 MG PO TABS: ORAL_TABLET | ORAL | 0 refills | Status: DC

## 2017-02-26 MED ORDER — HYDROCOD POLST-CPM POLST ER 10-8 MG/5ML PO SUER
5.0000 mL | Freq: Every evening | ORAL | 0 refills | Status: DC | PRN
Start: 2017-02-26 — End: 2017-06-09

## 2017-02-26 NOTE — Progress Notes (Signed)
BP 120/80   Pulse 94   Temp 98 F (36.7 C) (Oral)   LMP 06/01/2013   SpO2 98%    CC: cough Subjective:    Patient ID: Bretta BangAmy C Buccellato, female    DOB: 10/18/1960, 56 y.o.   MRN: [id number]  HPI: Bretta Bangmy C Sandlin is a 56 y.o. female presenting on 02/26/2017 for Cough   Has not yet scheduled CPE. Overdue for preventative healthcare. "I have not felt well since last seen"  Seen here last month with acute sinusitis and L acute otitis media, treated with 10d augmentin course and cheratussin for cough. Symptoms persisted so we treated with 10d doxycycline course. This helped sinus and ear pain resolve. However, has persistent productive cough of clear mucous. Persistently hoarse voice, persistent coughing fits worse at night time. ST and PNdrainage.   Denies fevers/chills, tooth pain, wheezing or dyspnea.  No sick contacts at home.  Not around smokers No h/o asthma.   Cheratussin didn't help control cough at night.   Relevant past medical, surgical, family and social history reviewed and updated as indicated. Interim medical history since our last visit reviewed. Allergies and medications reviewed and updated. Outpatient Medications Prior to Visit  Medication Sig Dispense Refill  . acetaminophen (TYLENOL) 500 MG tablet Take 500 mg by mouth as needed.     . Ascorbic Acid (VITAMIN C) 1000 MG tablet Take 1,000 mg by mouth daily.    Marland Kitchen. aspirin EC 81 MG tablet Take 81 mg by mouth every evening.      . DULoxetine (CYMBALTA) 60 MG capsule Take 60 mg by mouth 2 (two) times daily.     Marland Kitchen. escitalopram (LEXAPRO) 10 MG tablet Take 10 mg by mouth daily.     . ferrous sulfate 325 (65 FE) MG tablet Take 325 mg by mouth daily with breakfast.    . gabapentin (NEURONTIN) 300 MG capsule Take 600 mg by mouth 2 (two) times daily.     Marland Kitchen. lamoTRIgine (LAMICTAL) 150 MG tablet TAKE 1 TABLET BY MOUTH TWICE A DAY 180 tablet 1  . MAGNESIUM PO Take by mouth daily.    . Melatonin 3 MG TABS Take 10 mg by mouth at bedtime.       . Multiple Vitamin (MULTIVITAMIN) tablet Take 1 tablet by mouth daily.    . naproxen sodium (ANAPROX) 220 MG tablet Take 440 mg by mouth as needed.     Marland Kitchen. POTASSIUM PO Take by mouth daily.    . primidone (MYSOLINE) 50 MG tablet TAKE ONE TABLET BY MOUTH EVERY NIGHT AT BEDTIME 90 tablet 3  . propranolol (INDERAL) 40 MG tablet TAKE 1 TABLET BY MOUTH DAILY 90 tablet 1  . vitamin B-12 (CYANOCOBALAMIN) 1000 MCG tablet Take 500 mcg by mouth daily.      No facility-administered medications prior to visit.      Per HPI unless specifically indicated in ROS section below Review of Systems     Objective:    BP 120/80   Pulse 94   Temp 98 F (36.7 C) (Oral)   LMP 06/01/2013   SpO2 98%   Wt Readings from Last 3 Encounters:  02/12/17 217 lb (98.4 kg)  01/26/17 211 lb 8 oz (95.9 kg)  10/28/16 216 lb 12.8 oz (98.3 kg)    Physical Exam  Constitutional: She appears well-developed and well-nourished. No distress.  HENT:  Head: Normocephalic and atraumatic.  Right Ear: Hearing, tympanic membrane, external ear and ear canal normal.  Left Ear: Hearing, tympanic  membrane, external ear and ear canal normal.  Nose: No mucosal edema or rhinorrhea. Right sinus exhibits no maxillary sinus tenderness and no frontal sinus tenderness. Left sinus exhibits no maxillary sinus tenderness and no frontal sinus tenderness.  Mouth/Throat: Uvula is midline, oropharynx is clear and moist and mucous membranes are normal. No oropharyngeal exudate, posterior oropharyngeal edema, posterior oropharyngeal erythema or tonsillar abscesses.  TMs clear  Eyes: Pupils are equal, round, and reactive to light. Conjunctivae and EOM are normal. No scleral icterus.  Neck: Normal range of motion. Neck supple.  Cardiovascular: Normal rate, regular rhythm, normal heart sounds and intact distal pulses.   No murmur heard. Pulmonary/Chest: Effort normal and breath sounds normal. No respiratory distress. She has no wheezes. She has no  rales.  Persistent harsh cough but lungs largely clear  Lymphadenopathy:    She has no cervical adenopathy.  Skin: Skin is warm and dry. No rash noted.  Nursing note and vitals reviewed.     Assessment & Plan:  Encouraged she schedule CPE when feeling better.  Problem List Items Addressed This Visit    Bronchitis    Anticipate ongoing irritative bronchitis after recent treatment of sinusitis and otitis media - those symptoms have largely resolved, however persistent harsh nagging cough. She has completed recent augmentin and doxy courses. I don't think there's ongoing bacterial infection as no fever, lungs largely clear. Anticipate cough is more irritative post-infectious. Treat with prednisone course and sent in tussionex pennkinetic cough syrup for night time. Discussed further supportive care of NSAID after completes prednisone course. If not better, would consider zpack abx. Pt agrees with plan.           Follow up plan: Return if symptoms worsen or fail to improve.  [name], MD

## 2017-02-26 NOTE — Patient Instructions (Signed)
I don't think we have persistent bacterial infection - but rather persistent inflammation and irritation of bronchioles. Treat with steroid course, tussionex pennkinetic cough syrup. After steroid course, may take ibuprofen or aleve for any residual inflammation.  Update me with effect. If not improving we may do another antibiotic course (zpack).

## 2017-02-26 NOTE — Assessment & Plan Note (Signed)
Anticipate ongoing irritative bronchitis after recent treatment of sinusitis and otitis media - those symptoms have largely resolved, however persistent harsh nagging cough. She has completed recent augmentin and doxy courses. I don't think there's ongoing bacterial infection as no fever, lungs largely clear. Anticipate cough is more irritative post-infectious. Treat with prednisone course and sent in tussionex pennkinetic cough syrup for night time. Discussed further supportive care of NSAID after completes prednisone course. If not better, would consider zpack abx. Pt agrees with plan.

## 2017-03-02 ENCOUNTER — Other Ambulatory Visit: Payer: Self-pay | Admitting: Neurology

## 2017-03-27 ENCOUNTER — Ambulatory Visit (INDEPENDENT_AMBULATORY_CARE_PROVIDER_SITE_OTHER): Payer: 59 | Admitting: Psychology

## 2017-03-27 DIAGNOSIS — F331 Major depressive disorder, recurrent, moderate: Secondary | ICD-10-CM

## 2017-03-27 DIAGNOSIS — F449 Dissociative and conversion disorder, unspecified: Secondary | ICD-10-CM | POA: Diagnosis not present

## 2017-03-31 ENCOUNTER — Telehealth: Payer: Self-pay | Admitting: Neurology

## 2017-03-31 ENCOUNTER — Ambulatory Visit (INDEPENDENT_AMBULATORY_CARE_PROVIDER_SITE_OTHER): Payer: Medicare Other | Admitting: Neurology

## 2017-03-31 DIAGNOSIS — R202 Paresthesia of skin: Secondary | ICD-10-CM | POA: Diagnosis not present

## 2017-03-31 DIAGNOSIS — G5603 Carpal tunnel syndrome, bilateral upper limbs: Secondary | ICD-10-CM

## 2017-03-31 NOTE — Progress Notes (Signed)
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  I would recommend ortho eval by Dr. Amanda Pea or Melvyn Novas if she is agreeable.

## 2017-03-31 NOTE — Telephone Encounter (Signed)
Patient made aware of results. Agreeable to referral. Referral faxed to Dr. Amanda Pea at [phone] with confirmation received. She was made aware to call them if she does not hear anything back.

## 2017-03-31 NOTE — Procedures (Signed)
[facility]  [address], [address]  [place], [place] [address] Tel: [phone] Fax:  [phone] Test Date:  03/31/2017  Patient: Jamie Patterson DOB: February 10, 1961 Physician: Nita Sickle, DO  Sex: Female Height: 5\' 7"  Ref Phys: Kerin Salen, D.O.  ID#: [id number] Temp: 37.8C Technician:    Patient Complaints: This is a 56 year-old female with history of carpal tunnel syndrome referred for progressively worsening paresthesias to evaluate severity of disease.  NCV & EMG Findings: Extensive electrodiagnostic testing of the left upper extremity and additional studies of the right shows:  1. Left median motor responses show prolonged distal peak latency (4.9 ms) and reduced amplitude (5.8 V). Right median sensory response shows showed prolonged distal peak latency (3.9 ms) and normal amplitude. Bilateral ulnar sensory responses are within normal limits. 2. Left median motor response shows prolonged latency (4.2 ms) and normal amplitude. Right median motor responses within normal limits. Bilateral ulnar motor responses show decreased conduction velocity (A Elbow-B Elbow, L45, R43 m/s) across the elbow. 3. The left upper extremity, sparse chronic motor axon loss changes are seen affecting the abductor pollicis brevis, abductor digiti minimi, and flexor carpi ulnaris muscles. These findings are not present in the right upper extremity and there is no evidence of active denervation.  Impression: 1. Left carpal tunnel syndrome; moderate-to-severe in degree electrically. 2. Right carpal tunnel syndrome; mild in degree electrically. 3. 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   ___________________________ Nita Sickle, DO    Nerve Conduction Studies Anti Sensory Summary Table   Site NR Peak (ms) Norm Peak (ms) P-T Amp (V) Norm P-T Amp  Left Median Anti Sensory (2nd Digit)  37.8C  Wrist    4.9 <3.6  5.8 >15  Right Median Anti Sensory (2nd Digit)  37.8C  Wrist    3.9 <3.6 18.1 >15  Left Ulnar Anti Sensory (5th Digit)  37.8C  Wrist    2.6 <3.1 14.6 >10  Right Ulnar Anti Sensory (5th Digit)  37.8C  Wrist    2.8 <3.1 15.1 >10   Motor Summary Table   Site NR Onset (ms) Norm Onset (ms) O-P Amp (mV) Norm O-P Amp Site1 Site2 Delta-0 (ms) Dist (cm) Vel (m/s) Norm Vel (m/s)  Left Median Motor (Abd Poll Brev)  37.8C  Wrist    4.2 <4.0 6.8 >6 Elbow Wrist 5.0 28.0 56 >50  Elbow    9.2  6.7         Right Median Motor (Abd Poll Brev)  37.8C  Wrist    3.6 <4.0 11.6 >6 Elbow Wrist 5.3 29.0 55 >50  Elbow    8.9  11.3         Left Ulnar Motor (Abd Dig Minimi)  37.8C  Wrist    2.3 <3.1 7.2 >7 B Elbow Wrist 3.5 23.0 66 >50  B Elbow    5.8  7.0  A Elbow B Elbow 2.2 10.0 45 >50  A Elbow    8.0  6.8         Right Ulnar Motor (Abd Dig Minimi)  37.8C  Wrist    1.9 <3.1 7.2 >7 B Elbow Wrist 3.5 23.0 66 >50  B Elbow    5.4  6.0  A Elbow B Elbow 2.3 10.0 43 >50  A Elbow    7.7  5.8          EMG   Side Muscle Ins Act Fibs Psw Fasc  Number Recrt Dur Dur. Amp Amp. Poly Poly. Comment  Right 1stDorInt Nml Nml Nml Nml Nml Nml Nml Nml Nml Nml Nml Nml N/A  Right Abd Poll Brev Nml Nml Nml Nml Nml Nml Nml Nml Nml Nml Nml Nml N/A  Right PronatorTeres Nml Nml Nml Nml Nml Nml Nml Nml Nml Nml Nml Nml N/A  Right Biceps Nml Nml Nml Nml Nml Nml Nml Nml Nml Nml Nml Nml N/A  Right Triceps Nml Nml Nml Nml Nml Nml Nml Nml Nml Nml Nml Nml N/A  Right Deltoid Nml Nml Nml Nml Nml Nml Nml Nml Nml Nml Nml Nml N/A  Right ABD Dig Min Nml Nml Nml Nml Nml Nml Nml Nml Nml Nml Nml Nml N/A  Right FlexDigProf 4,5 Nml Nml Nml Nml Nml Nml Nml Nml Nml Nml Nml Nml N/A  Left 1stDorInt Nml Nml Nml Nml Nml Nml Nml Nml Nml Nml Nml Nml N/A  Left Abd Poll Brev Nml Nml Nml Nml 1- Rapid Some 1+ Some 1+ Nml Nml N/A  Left Ext Indicis Nml Nml Nml Nml Nml Nml Nml Nml Nml Nml Nml Nml N/A  Left PronatorTeres Nml Nml Nml Nml Nml Nml Nml Nml Nml  Nml Nml Nml N/A  Left Biceps Nml Nml Nml Nml Nml Nml Nml Nml Nml Nml Nml Nml N/A  Left Triceps Nml Nml Nml Nml Nml Nml Nml Nml Nml Nml Nml Nml N/A  Left Deltoid Nml Nml Nml Nml Nml Nml Nml Nml Nml Nml Nml Nml N/A  Left ABD Dig Min Nml Nml Nml Nml 1- Rapid Few 1+ Few 1+ Nml Nml N/A  Left FlexCarpiUln Nml Nml Nml Nml 1- Rapid Some 1+ Some 1+ Nml Nml N/A      Waveforms:

## 2017-03-31 NOTE — Telephone Encounter (Signed)
-----   Message from Octaviano Batty Tat, DO sent at 03/31/2017 12:53 PM EDT -----   ----- Message ----- From: Glendale Chard, DO Sent: 03/31/2017  11:40 AM To: [name], MD, Octaviano Batty Tat, DO

## 2017-03-31 NOTE — Telephone Encounter (Signed)
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  I would recommend ortho eval by Dr. Amanda Pea or Melvyn Novas if she is agreeable.

## 2017-04-02 ENCOUNTER — Other Ambulatory Visit: Payer: Self-pay | Admitting: Neurology

## 2017-04-03 ENCOUNTER — Ambulatory Visit (INDEPENDENT_AMBULATORY_CARE_PROVIDER_SITE_OTHER): Payer: 59 | Admitting: Psychology

## 2017-04-03 DIAGNOSIS — F331 Major depressive disorder, recurrent, moderate: Secondary | ICD-10-CM

## 2017-04-03 DIAGNOSIS — F449 Dissociative and conversion disorder, unspecified: Secondary | ICD-10-CM | POA: Diagnosis not present

## 2017-04-17 ENCOUNTER — Telehealth: Payer: Self-pay | Admitting: Family Medicine

## 2017-04-17 ENCOUNTER — Ambulatory Visit (INDEPENDENT_AMBULATORY_CARE_PROVIDER_SITE_OTHER): Payer: Medicare Other | Admitting: Psychology

## 2017-04-17 DIAGNOSIS — F449 Dissociative and conversion disorder, unspecified: Secondary | ICD-10-CM | POA: Diagnosis not present

## 2017-04-17 DIAGNOSIS — F331 Major depressive disorder, recurrent, moderate: Secondary | ICD-10-CM | POA: Diagnosis not present

## 2017-04-17 NOTE — Telephone Encounter (Signed)
Pt decline to schedule AWV at this time. She is currently sick and will call back to schedule

## 2017-04-19 ENCOUNTER — Encounter: Payer: Self-pay | Admitting: Family Medicine

## 2017-04-19 DIAGNOSIS — G5600 Carpal tunnel syndrome, unspecified upper limb: Secondary | ICD-10-CM | POA: Insufficient documentation

## 2017-05-01 ENCOUNTER — Ambulatory Visit (INDEPENDENT_AMBULATORY_CARE_PROVIDER_SITE_OTHER): Payer: Medicare Other | Admitting: Psychology

## 2017-05-01 DIAGNOSIS — F449 Dissociative and conversion disorder, unspecified: Secondary | ICD-10-CM | POA: Diagnosis not present

## 2017-05-01 DIAGNOSIS — F331 Major depressive disorder, recurrent, moderate: Secondary | ICD-10-CM

## 2017-05-15 ENCOUNTER — Ambulatory Visit: Payer: Medicare Other | Admitting: Psychology

## 2017-05-19 ENCOUNTER — Other Ambulatory Visit: Payer: Self-pay | Admitting: Neurology

## 2017-05-29 ENCOUNTER — Ambulatory Visit (INDEPENDENT_AMBULATORY_CARE_PROVIDER_SITE_OTHER): Payer: Medicare Other | Admitting: Psychology

## 2017-05-29 DIAGNOSIS — F449 Dissociative and conversion disorder, unspecified: Secondary | ICD-10-CM

## 2017-05-29 DIAGNOSIS — F331 Major depressive disorder, recurrent, moderate: Secondary | ICD-10-CM | POA: Diagnosis not present

## 2017-06-09 ENCOUNTER — Encounter: Payer: Self-pay | Admitting: Family Medicine

## 2017-06-09 ENCOUNTER — Ambulatory Visit (INDEPENDENT_AMBULATORY_CARE_PROVIDER_SITE_OTHER): Payer: Medicare Other | Admitting: Family Medicine

## 2017-06-09 VITALS — BP 120/84 | HR 74 | Temp 98.0°F | Wt 225.2 lb

## 2017-06-09 DIAGNOSIS — R053 Chronic cough: Secondary | ICD-10-CM

## 2017-06-09 DIAGNOSIS — R05 Cough: Secondary | ICD-10-CM | POA: Diagnosis not present

## 2017-06-09 DIAGNOSIS — R490 Dysphonia: Secondary | ICD-10-CM

## 2017-06-09 MED ORDER — ALBUTEROL SULFATE (2.5 MG/3ML) 0.083% IN NEBU
2.5000 mg | INHALATION_SOLUTION | Freq: Once | RESPIRATORY_TRACT | Status: AC
  Administered 2017-06-09: 2.5 mg via RESPIRATORY_TRACT

## 2017-06-09 MED ORDER — IPRATROPIUM-ALBUTEROL 0.5-2.5 (3) MG/3ML IN SOLN: 3.0000 mL | Freq: Once | RESPIRATORY_TRACT | Status: DC

## 2017-06-09 MED ORDER — ALBUTEROL SULFATE HFA 108 (90 BASE) MCG/ACT IN AERS: 2.0000 | INHALATION_SPRAY | Freq: Four times a day (QID) | RESPIRATORY_TRACT | 2 refills | Status: DC | PRN

## 2017-06-09 MED ORDER — IPRATROPIUM BROMIDE 0.02 % IN SOLN
0.5000 mg | Freq: Once | RESPIRATORY_TRACT | Status: AC
  Administered 2017-06-09: 0.5 mg via RESPIRATORY_TRACT

## 2017-06-09 MED ORDER — AZITHROMYCIN 250 MG PO TABS: ORAL_TABLET | ORAL | 0 refills | Status: DC

## 2017-06-09 NOTE — Addendum Note (Signed)
Addended by: Gregery NaVALENCIA, Monte Zinni P on: 06/09/2017 01:56 PM   Modules accepted: Orders

## 2017-06-09 NOTE — Patient Instructions (Addendum)
Take zpack antibiotic to cover atypical infection  Peak flows were low - and did improve after albuterol treatment. Use albuterol inhaler 2 puffs every 4-6 hours as needed.  We will refer you to lung doctor as well as ENT for further evaluation.

## 2017-06-09 NOTE — Assessment & Plan Note (Addendum)
Persistent chronic cough since at least 01/2017, not improved with 2 abx course and prednisone course. Will treat with zpack to cover possible prolonged atypical infection.  Pre-albuterol PF today 33% predicted. Post-albuterol PF today 79% predicted. ?cough variant asthma - no wheezing appreciated. Will Rx albuterol rescue inhaler to use PRN dyspnea, wheeze, will refer to pulm for further eval of chronic cough.

## 2017-06-09 NOTE — Progress Notes (Signed)
BP 120/84 (BP Location: Left Arm, Patient Position: Sitting, Cuff Size: Large)   Pulse 74   Temp 98 F (36.7 C) (Oral)   Wt 225 lb 4 oz (102.2 kg)   LMP 06/01/2013   SpO2 98%   BMI 35.28 kg/m    CC: cough Subjective:    Patient ID: Bretta BangAmy C Nordmeyer, female    DOB: 02/20/1961, 56 y.o.   MRN: [id number]  HPI: Bretta Bangmy C Himebaugh is a 56 y.o. female presenting on 06/09/2017 for Cough (sxs initially started 01/2017. Still ongoing, disturbs sleep, has caused hoarseness and coughing "fits". Says it is a loud cough. Has tried many otc meds, no help. Says she has been seen about 3x for sxs. Asking if needs to see ENT)   Here with fianc.   Ongoing cough since summer - dx initially sinusitis and L acute otitis media, treated with 10d augmentin course, cheratussin. Coverage expanded to doxy 10d course due to ongoing symptoms sinus - this resolved sinus symptoms, but cough persists since then. Treated in interim with prednisone taper then NSAID course without any benefit endorsed by patient.   Persistent hoarse voice, persistent coughing fits worse at night time - 2 minutes at a time. Some post tussive gagging, no emesis. No fevers/chills. Dry nagging cough.   No h/o asthma.  Albuterol inhaler has seemed to help.  No GERD symptoms, no allergic rhinitis symptoms.   Relevant past medical, surgical, family and social history reviewed and updated as indicated. Interim medical history since our last visit reviewed. Allergies and medications reviewed and updated. Outpatient Medications Prior to Visit  Medication Sig Dispense Refill  . acetaminophen (TYLENOL) 500 MG tablet Take 500 mg by mouth as needed.     . Ascorbic Acid (VITAMIN C) 1000 MG tablet Take 1,000 mg by mouth daily.    Marland Kitchen. aspirin EC 81 MG tablet Take 81 mg by mouth every evening.      . DULoxetine (CYMBALTA) 60 MG capsule Take 60 mg by mouth 2 (two) times daily.     Marland Kitchen. escitalopram (LEXAPRO) 10 MG tablet Take 10 mg by mouth daily.     . ferrous  sulfate 325 (65 FE) MG tablet Take 325 mg by mouth daily with breakfast.    . gabapentin (NEURONTIN) 300 MG capsule Take 600 mg by mouth 2 (two) times daily.     Marland Kitchen. lamoTRIgine (LAMICTAL) 150 MG tablet TAKE 1 TABLET BY MOUTH TWICE A DAY 180 tablet 1  . MAGNESIUM PO Take by mouth daily.    . Melatonin 3 MG TABS Take 10 mg by mouth at bedtime.     . Multiple Vitamin (MULTIVITAMIN) tablet Take 1 tablet by mouth daily.    . naproxen sodium (ANAPROX) 220 MG tablet Take 440 mg by mouth as needed.     Marland Kitchen. POTASSIUM PO Take by mouth daily.    . primidone (MYSOLINE) 50 MG tablet TAKE ONE TABLET BY MOUTH EVERY NIGHT AT BEDTIME 90 tablet 1  . propranolol (INDERAL) 40 MG tablet TAKE 1 TABLET BY MOUTH DAILY 90 tablet 1  . vitamin B-12 (CYANOCOBALAMIN) 1000 MCG tablet Take 500 mcg by mouth daily.     . chlorpheniramine-HYDROcodone (TUSSIONEX PENNKINETIC ER) 10-8 MG/5ML SUER Take 5 mLs by mouth at bedtime as needed for cough. 140 mL 0  . predniSONE (DELTASONE) 20 MG tablet Take two tablets daily for 3 days followed by one tablet daily for 4 days 10 tablet 0   No facility-administered medications prior to visit.  Per HPI unless specifically indicated in ROS section below Review of Systems     Objective:    BP 120/84 (BP Location: Left Arm, Patient Position: Sitting, Cuff Size: Large)   Pulse 74   Temp 98 F (36.7 C) (Oral)   Wt 225 lb 4 oz (102.2 kg)   LMP 06/01/2013   SpO2 98%   BMI 35.28 kg/m   Wt Readings from Last 3 Encounters:  06/09/17 225 lb 4 oz (102.2 kg)  02/12/17 217 lb (98.4 kg)  01/26/17 211 lb 8 oz (95.9 kg)      Ht Readings from Last 3 Encounters:  02/12/17 5\' 7"  (1.702 m)  01/26/17 5\' 7"  (1.702 m)  10/28/16 5\' 7"  (1.702 m)   Physical Exam  Constitutional: She appears well-developed and well-nourished. No distress.  HENT:  Head: Normocephalic and atraumatic.  Right Ear: Hearing, tympanic membrane, external ear and ear canal normal.  Left Ear: Hearing, tympanic membrane,  external ear and ear canal normal.  Nose: Mucosal edema (L>R nasal congestion) present. No rhinorrhea. Right sinus exhibits no maxillary sinus tenderness and no frontal sinus tenderness. Left sinus exhibits no maxillary sinus tenderness and no frontal sinus tenderness.  Mouth/Throat: Uvula is midline, oropharynx is clear and moist and mucous membranes are normal. No oropharyngeal exudate, posterior oropharyngeal edema, posterior oropharyngeal erythema or tonsillar abscesses.  Eyes: Pupils are equal, round, and reactive to light. Conjunctivae and EOM are normal. No scleral icterus.  Neck: Normal range of motion. Neck supple. No thyromegaly present.  Cardiovascular: Normal rate, regular rhythm, normal heart sounds and intact distal pulses.   No murmur heard. Pulmonary/Chest: Effort normal and breath sounds normal. No respiratory distress. She has no wheezes. She has no rales.  Lungs clear, harsh nagging coughing fits present  Lymphadenopathy:    She has no cervical adenopathy.  Skin: Skin is warm and dry. No rash noted.  Nursing note and vitals reviewed.  PF before albuterol: 140 (33%) PF after albuterol:  340 (79%) PF expected for height and age: ~430 L/min    Assessment & Plan:   Problem List Items Addressed This Visit    Chronic cough - Primary    Persistent chronic cough since at least 01/2017, not improved with 2 abx course and prednisone course. Will treat with zpack to cover possible prolonged atypical infection.  Pre-albuterol PF today 33% predicted. Post-albuterol PF today 79% predicted. ?cough variant asthma - no wheezing appreciated. Will Rx albuterol rescue inhaler to use PRN dyspnea, wheeze, will refer to pulm for further eval of chronic cough.       Relevant Orders   Ambulatory referral to Pulmonology   Hoarseness    Ongoing hoarseness for months - will refer to ENT for eval.       Relevant Orders   Ambulatory referral to ENT       Follow up plan: Return if symptoms  worsen or fail to improve.  [name], MD

## 2017-06-09 NOTE — Assessment & Plan Note (Signed)
Ongoing hoarseness for months - will refer to ENT for eval.

## 2017-06-12 ENCOUNTER — Ambulatory Visit (INDEPENDENT_AMBULATORY_CARE_PROVIDER_SITE_OTHER): Payer: Medicare Other | Admitting: Internal Medicine

## 2017-06-12 ENCOUNTER — Other Ambulatory Visit (INDEPENDENT_AMBULATORY_CARE_PROVIDER_SITE_OTHER): Payer: Medicare Other

## 2017-06-12 ENCOUNTER — Encounter: Payer: Self-pay | Admitting: Internal Medicine

## 2017-06-12 ENCOUNTER — Ambulatory Visit (INDEPENDENT_AMBULATORY_CARE_PROVIDER_SITE_OTHER)
Admission: RE | Admit: 2017-06-12 | Discharge: 2017-06-12 | Disposition: A | Discharge status: Home / Self Care | Payer: Medicare Other | Source: Ambulatory Visit | Attending: Internal Medicine | Admitting: Internal Medicine

## 2017-06-12 VITALS — BP 122/82 | HR 75 | Ht 65.0 in | Wt 221.0 lb

## 2017-06-12 DIAGNOSIS — R053 Chronic cough: Secondary | ICD-10-CM

## 2017-06-12 DIAGNOSIS — R05 Cough: Secondary | ICD-10-CM | POA: Diagnosis not present

## 2017-06-12 LAB — CBC WITH DIFFERENTIAL/PLATELET
BASOS ABS: 0 10*3/uL (ref 0.0–0.1)
Basophils Relative: 0.8 % (ref 0.0–3.0)
EOS PCT: 2.9 % (ref 0.0–5.0)
Eosinophils Absolute: 0.1 10*3/uL (ref 0.0–0.7)
HEMATOCRIT: 43.3 % (ref 36.0–46.0)
HEMOGLOBIN: 14.7 g/dL (ref 12.0–15.0)
LYMPHS ABS: 1.4 10*3/uL (ref 0.7–4.0)
LYMPHS PCT: 29.4 % (ref 12.0–46.0)
MCHC: 34 g/dL (ref 30.0–36.0)
MCV: 98.4 fl (ref 78.0–100.0)
MONOS PCT: 11.7 % (ref 3.0–12.0)
Monocytes Absolute: 0.5 10*3/uL (ref 0.1–1.0)
NEUTROS PCT: 55.2 % (ref 43.0–77.0)
Neutro Abs: 2.6 10*3/uL (ref 1.4–7.7)
Platelets: 227 10*3/uL (ref 150.0–400.0)
RBC: 4.4 Mil/uL (ref 3.87–5.11)
RDW: 12.4 % (ref 11.5–15.5)
WBC: 4.7 10*3/uL (ref 4.0–10.5)

## 2017-06-12 LAB — NITRIC OXIDE: NITRIC OXIDE: 33

## 2017-06-12 MED ORDER — FAMOTIDINE 20 MG PO TABS: ORAL_TABLET | ORAL | 2 refills | Status: DC

## 2017-06-12 MED ORDER — ACETAMINOPHEN-CODEINE #3 300-30 MG PO TABS: ORAL_TABLET | ORAL | 0 refills | Status: DC

## 2017-06-12 MED ORDER — PREDNISONE 10 MG PO TABS: ORAL_TABLET | ORAL | 0 refills | Status: DC

## 2017-06-12 MED ORDER — PANTOPRAZOLE SODIUM 40 MG PO TBEC: 40.0000 mg | DELAYED_RELEASE_TABLET | Freq: Every day | ORAL | 2 refills | Status: DC

## 2017-06-12 NOTE — Assessment & Plan Note (Addendum)
Allergy profile 06/12/2017 >  Eos 0.1 /  IgE  Pending   - Spirometry 06/12/2017  Nl/ no curvature to suggest any obstruction with no rx prior  - FENO 06/12/2017  =   33  - Cyclical cough regimen 06/12/2017    The most common causes of chronic cough in immunocompetent adults include the following: upper airway cough syndrome (UACS), previously referred to as postnasal drip syndrome (PNDS), which is caused by variety of rhinosinus conditions; (2) asthma; (3) GERD; (4) chronic bronchitis from cigarette smoking or other inhaled environmental irritants; (5) nonasthmatic eosinophilic bronchitis; and (6) bronchiectasis.   These conditions, singly or in combination, have accounted for up to 94% of the causes of chronic cough in prospective studies.   Other conditions have constituted no >6% of the causes in prospective studies These have included bronchogenic carcinoma, chronic interstitial pneumonia, sarcoidosis, left ventricular failure, ACEI-induced cough, and aspiration from a condition associated with pharyngeal dysfunction.    Chronic cough is often simultaneously caused by more than one condition. A single cause has been found from 38 to 82% of the time, multiple causes from 18 to 62%. Multiply caused cough has been the result of three diseases up to 42% of the time.   Suspect this is a multifactorial cough with cyclical features with indeterminate FENO which suggests possible eos/asthma component as well(which is problematic in pt on inderal for obvious reasons but for now will not rec any change in Beta blocker today in absence of demonstrable airflow obst on spirometry.   Of the three most common causes of  Sub-acute or recurrent or chronic cough, only one (GERD)  can actually contribute to/ trigger  the other two (asthma and post nasal drip syndrome)  and perpetuate the cylce of cough.  While not intuitively obvious, many patients with chronic low grade reflux do not cough until there is a primary  insult that disturbs the protective epithelial barrier and exposes sensitive nerve endings.   This is typically viral but can be due to bacterial sinus infections as well so ent w/u needs to be completed as planned.  The point is that once this occurs, it is difficult to eliminate the cycle  using anything but a maximally effective acid suppression regimen at least in the short run, accompanied by an appropriate diet to address non acid GERD and control / eliminate the cough itself for at least 3 days > try tylenol #3 for this then regroup in 2 weeks but keep appt with ent on 06/15/17 to r/o sinusitis as a potential source   Total time devoted to counseling  > 50 % of initial 60 min office visit:  review case with pt/ discussion of options/alternatives/ personally creating written customized instructions  in presence of pt  then going over those specific  Instructions directly with the pt including how to use all of the meds but in particular covering each new medication in detail and the difference between the maintenance= "automatic" meds and the prns using an action plan format for the latter (If this problem/symptom => do that organization reading Left to right).  Please see AVS from this visit for a full list of these instructions which I personally wrote for this pt and  are unique to this visit.

## 2017-06-12 NOTE — Progress Notes (Signed)
Spoke with pt and notified of results per Dr. Wert. Pt verbalized understanding and denied any questions. 

## 2017-06-12 NOTE — Patient Instructions (Addendum)
The key to effective treatment for your cough is eliminating the non-stop cycle of cough you're stuck in long enough to let your airway heal completely and then see if there is anything still making you cough once you stop the cough suppression, but this should take no more than 5 days to figure out  First take delsym two tsp every 12 hours and supplement if needed with  Tylenol # 3  up to 1- 2 every 4 hours to suppress the urge to cough at all or even clear your throat. Swallowing water or using ice chips/non mint and menthol containing candies (such as lifesavers or sugarless jolly ranchers) are also effective.  You should rest your voice and avoid activities that you know make you cough.  Once you have eliminated the cough for 3 straight days try reducing the Tylenol #3  first,  then the delsym as tolerated.   For drainage / throat tickle try take CHLORPHENIRAMINE  4 mg - take one every 4 hours as needed - available over the counter- may cause drowsiness so start with just a bedtime dose or two and see how you tolerate it before trying in daytime    Prednisone 10 mg take  4 each am x 2 days,   2 each am x 2 days,  1 each am x 2 days and stop (this is to eliminate allergies and inflammation from coughing)  Protonix (pantoprazole) Take 30-60 min before first meal of the day and Pepcid 20 mg one bedtime plus chlorpheniramine 4 mg x 2 at bedtime (both available over the counter)  until cough is completely gone for at least a week without the need for cough suppression  GERD (REFLUX)  is an extremely common cause of respiratory symptoms, many times with no significant heartburn at all.    It can be treated with medication, but also with lifestyle changes including avoidance of late meals, excessive alcohol, smoking cessation, and avoid fatty foods, chocolate, peppermint, colas, red wine, and acidic juices such as orange juice.  NO MINT OR MENTHOL PRODUCTS SO NO COUGH DROPS   USE HARD CANDY INSTEAD  (jolley ranchers or Stover's or Lifesavers (all available in sugarless versions) NO OIL BASED VITAMINS - use powdered substitutes.    Please remember to go to the lab and x-ray department downstairs in the basement  for your tests - we will call you with the results when they are available.   Please schedule a follow up office visit in 2 weeks, sooner if needed  with all medications /inhalers/ solutions in hand so we can verify exactly what you are taking. This includes all medications from all doctors and over the counters

## 2017-06-12 NOTE — Progress Notes (Signed)
Subjective:     Patient ID: Jamie Patterson, female   DOB: 01-09-1961,     MRN: [id number]  HPI   56 yowf quit smoking 1997  With new pattern of recurrent sinus infections several times a year since around 2016 rx with abx/ prednisone then typical onset of sinus symptoms in June 2018 assoc with cough with sinus symptoms which improved  p 3rd ov with Dr Reece Agar and sev rounds abx / prednisone but the cough evolved to much worse assoc with hoarseness, worse with eating, talking and also typically wakes her up sev hours after lie down so referred to pulmonary clinic 06/12/2017 by Dr   Nadyne Coombes.   06/12/2017 1st [facility] office visit/ Amandeep Nesmith   Chief Complaint  Patient presents with  . Pulmonary Consult    Referred by Dr. Sharen Hones. Pt c/o cough since June 2018- started with sinus and ear infection.  Cough is usually non prod.  She states she runs out of breath just talking sometimes, and her voice is hoarse. She ues albuterol hfa 2 x per wk on average.   onset was acute in June 2018 assoc with nasal congestion  Cough is severe but dry - feels a tickle in suprasternal notch area  Cough drops or sips of water help the most  Not sob unless coughing On inderol for hbp and tremor, not convinced saba helping  Seeing ENT on 06/15/17   No obvious day to day or daytime variability or assoc excess/ purulent sputum or mucus plugs or hemoptysis or cp or chest tightness, subjective wheeze or overt   hb symptoms. No unusual exp hx or h/o childhood pna/ asthma or knowledge of premature birth.  Also denies any obvious fluctuation of symptoms with weather or environmental changes or other aggravating or alleviating factors except as outlined above   Current Allergies, Complete Past Medical History, Past Surgical History, Family History, and Social History were reviewed in [employer] record.  ROS  The following are not active complaints unless bolded Hoarseness, sore throat, dysphagia, dental  problems, itching, sneezing,  nasal congestion or discharge of excess mucus or purulent secretions, ear aches,   fever, chills, sweats, unintended wt loss or wt gain, classically pleuritic or exertional cp,  orthopnea pnd or leg swelling, presyncope, palpitations, abdominal pain, anorexia, nausea, vomiting, diarrhea  or change in bowel habits or change in bladder habits, change in stools or change in urine, dysuria, hematuria,  rash, arthralgias, visual complaints, headache, numbness, weakness or ataxia or problems with walking or coordination,  change in mood/affect or memory.        Current Meds  Medication Sig  . acetaminophen (TYLENOL) 500 MG tablet Take 500 mg by mouth as needed.   Marland Kitchen albuterol (PROVENTIL HFA;VENTOLIN HFA) 108 (90 Base) MCG/ACT inhaler Inhale 2 puffs into the lungs every 6 (six) hours as needed for wheezing or shortness of breath.  . Ascorbic Acid (VITAMIN C) 1000 MG tablet Take 1,000 mg by mouth daily.  Marland Kitchen aspirin EC 81 MG tablet Take 81 mg by mouth every evening.    Marland Kitchen azithromycin (ZITHROMAX) 250 MG tablet Take two tablets on day one followed by one tablet on days 2-5  . DULoxetine (CYMBALTA) 60 MG capsule Take 60 mg by mouth 2 (two) times daily.   Marland Kitchen escitalopram (LEXAPRO) 10 MG tablet Take 10 mg by mouth daily.   . ferrous sulfate 325 (65 FE) MG tablet Take 325 mg by mouth daily with breakfast.  . gabapentin (  NEURONTIN) 300 MG capsule Take 600 mg by mouth 2 (two) times daily.   Marland Kitchen lamoTRIgine (LAMICTAL) 150 MG tablet TAKE 1 TABLET BY MOUTH TWICE A DAY  . MAGNESIUM PO Take by mouth daily.  . Melatonin 3 MG TABS Take 10 mg by mouth at bedtime.   . Multiple Vitamin (MULTIVITAMIN) tablet Take 1 tablet by mouth daily.  . naproxen sodium (ANAPROX) 220 MG tablet Take 440 mg by mouth as needed.   . primidone (MYSOLINE) 50 MG tablet TAKE ONE TABLET BY MOUTH EVERY NIGHT AT BEDTIME  . propranolol (INDERAL) 40 MG tablet TAKE 1 TABLET BY MOUTH DAILY  . vitamin B-12 (CYANOCOBALAMIN) 1000  MCG tablet Take 500 mcg by mouth daily.        Review of Systems     Objective:   Physical Exam    obese amb wf nad  Wt Readings from Last 3 Encounters:  06/12/17 221 lb (100.2 kg)  06/09/17 225 lb 4 oz (102.2 kg)  02/12/17 217 lb (98.4 kg)    Vital signs reviewed  - Note on arrival 02 sats  98% on RA     HEENT: nl dentition, turbinates bilaterally, and oropharynx. Nl external ear canals without cough reflex   NECK :  without JVD/Nodes/TM/ nl carotid upstrokes bilaterally   LUNGS: no acc muscle use,  Nl contour chest which is clear to A and P bilaterally without cough on insp or exp maneuvers   CV:  RRR  no s3 or murmur or increase in P2, and no edema   ABD:  soft and nontender with nl inspiratory excursion in the supine position. No bruits or organomegaly appreciated, bowel sounds nl  MS:  Nl gait/ ext warm without deformities, calf tenderness, cyanosis or clubbing No obvious joint restrictions   SKIN: warm and dry without lesions    NEURO:  alert, approp, nl sensorium with  no motor or cerebellar deficits apparent.    CXR PA and Lateral:   06/12/2017 :    I personally reviewed images and agree with radiology impression as follows:   wnl      Labs ordered 06/12/2017  Allergy profile   Assessment:

## 2017-06-15 LAB — RESPIRATORY ALLERGY PROFILE REGION II ~~LOC~~
Allergen, A. alternata, m6: <0.1 kU/L
Allergen, Comm Silver Birch, t9: <0.1 kU/L
Allergen, Cottonwood, t14: <0.1 kU/L
Allergen, D pternoyssinus,d7: <0.1 kU/L
Bermuda Grass: <0.1 kU/L
Box Elder IgE: <0.1 kU/L
CLASS: 0
CLASS: 0
CLASS: 0
CLASS: 0
CLASS: 0
CLASS: 0
CLASS: 0
CLASS: 0
CLASS: 0
CLASS: 0
CLASS: 0
CLASS: 0
CLASS: 0
CLASS: 0
COMMON RAGWEED (SHORT) (W1) IGE: <0.1 kU/L
Class: 0
Class: 0
Class: 0
Class: 0
Class: 0
Class: 0
Class: 0
Class: 0
Class: 0
Class: 0
Dog Dander: <0.1 kU/L
Elm IgE: <0.1 kU/L
IgE (Immunoglobulin E), Serum: 3 kU/L (ref <=114)
Johnson Grass: <0.1 kU/L
Pecan/Hickory Tree IgE: <0.1 kU/L
Sheep Sorrel IgE: <0.1 kU/L

## 2017-06-15 LAB — INTERPRETATION:

## 2017-06-15 NOTE — Progress Notes (Signed)
Spoke with pt and notified of results per Dr. Wert. Pt verbalized understanding and denied any questions. 

## 2017-06-16 ENCOUNTER — Ambulatory Visit (INDEPENDENT_AMBULATORY_CARE_PROVIDER_SITE_OTHER): Payer: Medicare Other | Admitting: Psychology

## 2017-06-16 DIAGNOSIS — F331 Major depressive disorder, recurrent, moderate: Secondary | ICD-10-CM | POA: Diagnosis not present

## 2017-06-16 DIAGNOSIS — F449 Dissociative and conversion disorder, unspecified: Secondary | ICD-10-CM | POA: Diagnosis not present

## 2017-06-26 ENCOUNTER — Ambulatory Visit: Payer: Self-pay | Admitting: Internal Medicine

## 2017-06-30 ENCOUNTER — Ambulatory Visit: Payer: Self-pay | Admitting: Internal Medicine

## 2017-07-06 ENCOUNTER — Ambulatory Visit: Payer: Medicare Other | Admitting: Psychology

## 2017-08-14 IMAGING — DX DG CHEST 2V
2 series · 2 of 2 positions shown · non-contrast
Comparison: 08/20/2011

CLINICAL DATA: recurrent URI's, persistent cough x 3 weeks.

EXAM:
CHEST - 2 VIEW

[chest pa]
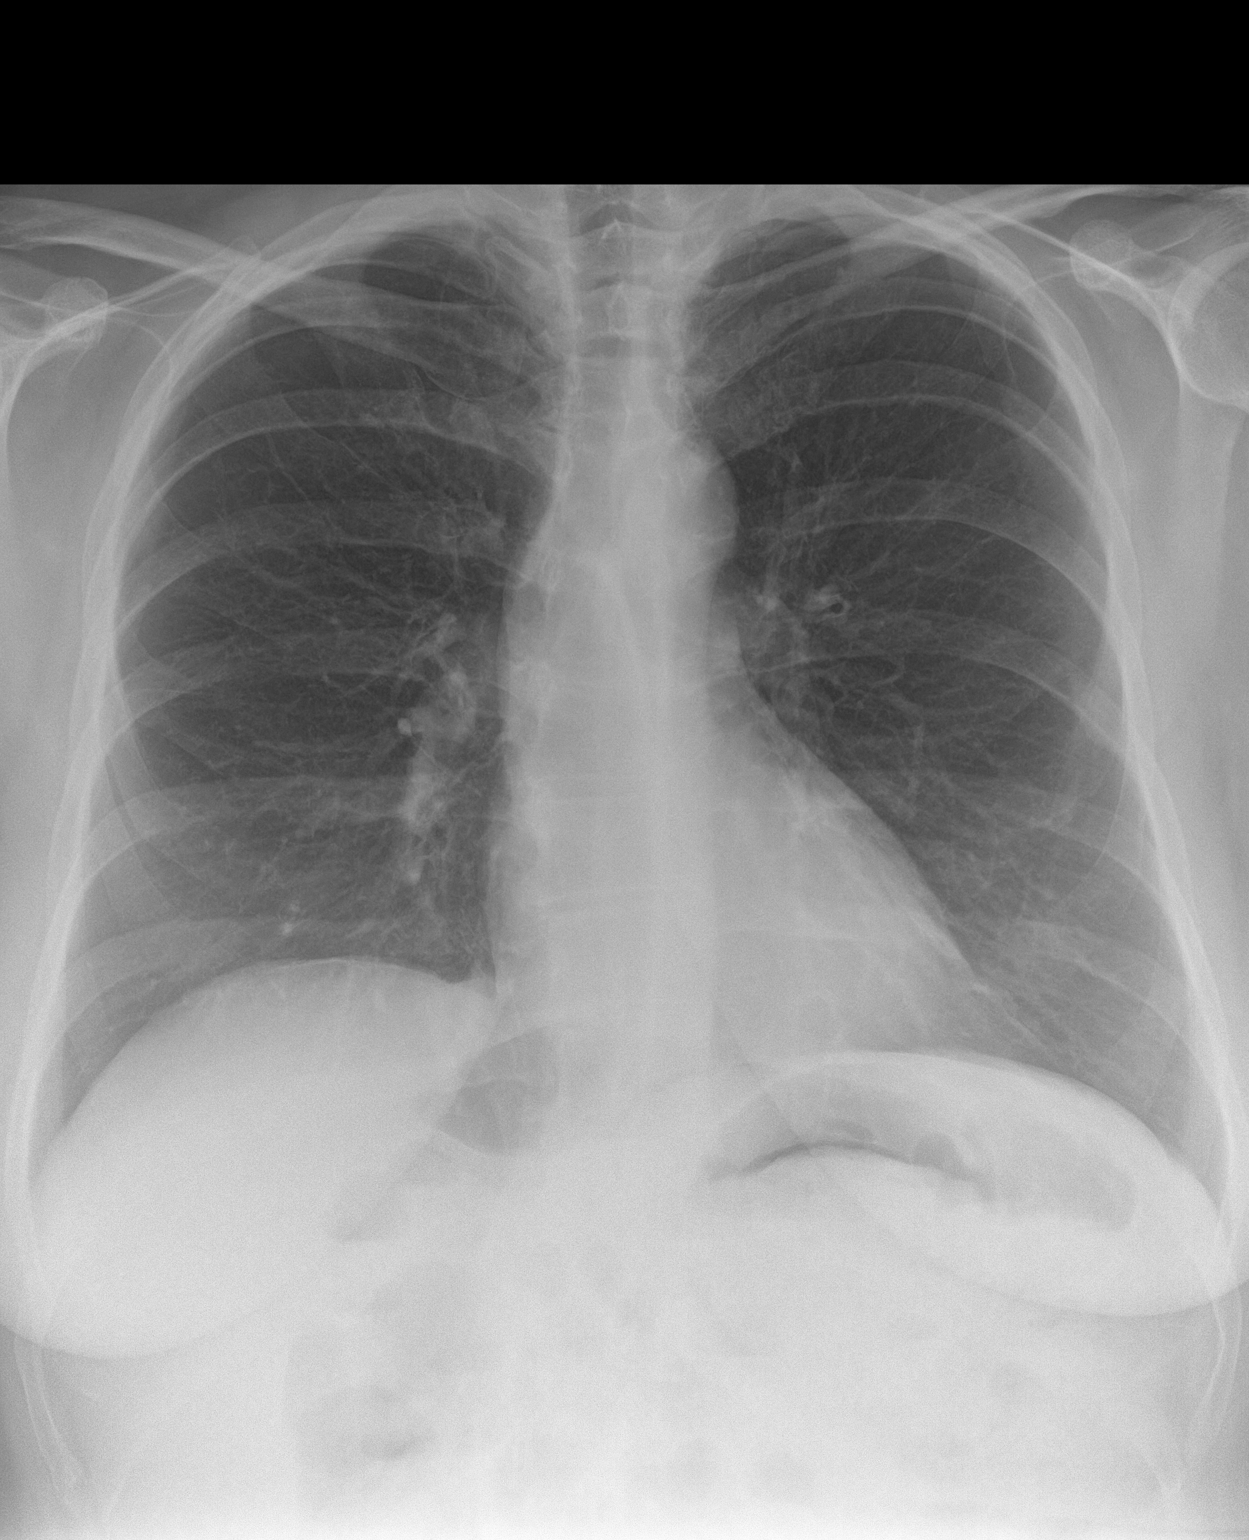

[chest lat]
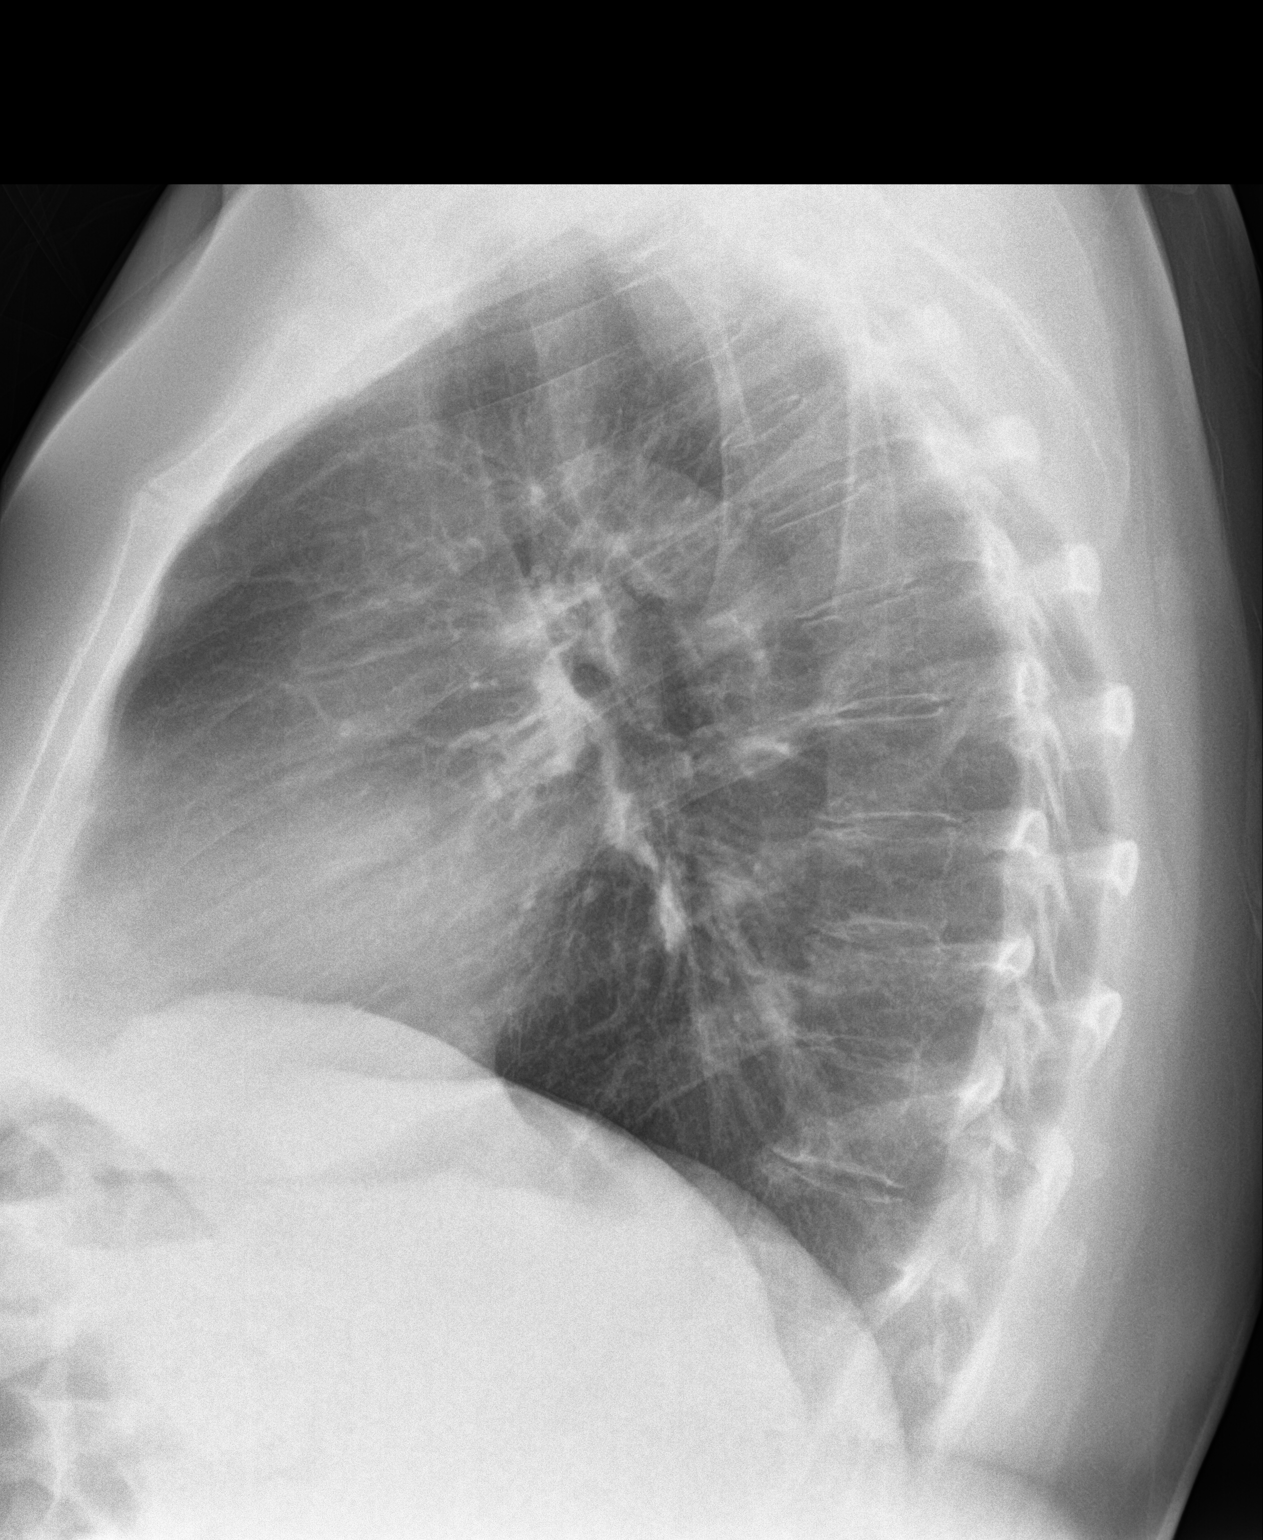

[2 of 2 positions shown; findings below may reference images not displayed]

FINDINGS: Coarse perihilar bronchovascular markings. No confluent infiltrate
or overt edema.

Heart size and mediastinal contours are within normal limits.

No effusion.

Visualized bones unremarkable.
IMPRESSION: No acute cardiopulmonary disease.

## 2017-08-14 NOTE — Progress Notes (Signed)
Jamie Patterson was seen today in neurologic consultation at the request of [name], MD and [name].  Since [name] is no longer with the practice, I will be resuming the patient's care.  I have reviewed the patient's prior medical records.    The patient has had 2 episodes of what sound like seizure.  These consisted of a vocalization, falling and generalized shaking.  An MRI of the brain was done and revealed multiple T2 hyperintensities, with some concern for demyelinating disease.  She subsequently had a lumbar puncture that was negative for oligoclonal bands.  She was placed on Keppra in addition to the Lamictal that she was already on for depression.  She subsequently had a sleep deprived EEG that was negative and therefore her Keppra was weaned and her Lamictal was increased, in hopes that that could be used as monotherapy for potential seizure and depression.  She remains on Wellbutrin, but she did talk with her psychiatrist about this.  They felt that the benefits outweigh the risks.  She was also placed on Neurontin since last visit.  She has only been on it since Friday.  She was on this for anxiety.  She does not know if it is helpful yet.  The patient also has tremor and after talking with [name], the patients psychiatrist, [name] started the patient on propranolol 20 mg, 2 tablets times a day.  The patient reports that it has helped dramatically, at least 75% helpful.  She reports that tremor began after a w/d from oxycontin and valium (she went off of it "cold [place]" and did not realize that she was "addicted" and should wean it).  She may have had tremor before then but worse after that.  She could feel the tremor inside and that is much better. She is no longer having trouble with activities of daily living like she was. She still has some dizziness if she stands up quickly, bends over or turns quickly.  She has adjusted to this fairly well.  She continues to complain of chronic daily  headache.  The headaches are holocephalic.  She has daily nausea and she is not sure if that is from the headache from the multiple medications.  She does not have vomiting.  There has been no photophobia.  She does have a history of migraine.  06/14/13 update:  The patient is following up today regarding her essential tremor.  She is currently on propranolol 40 mg twice per day.  She continues to have some dizziness if she stands up quickly, bends over or turns.  She has had no seizures since our last visit.  She does remain on Wellbutrin, and understands that this lower seizure threshold.  She has talked about this with her psychiatrist and they think that the benefits outweigh risks.  She is very concerned about memory changes.  She is having word finding difficulty.  She admits to continued difficulty with uncontrolled depression, but states that she is not suicidal or homicidal.  12/19/13 update:  The patient is following up on essential tremor.  She is currently on Mysoline, and last visit we tried to stop the propranolol because of dizziness.  Unfortunately, tremor increased and we had to restart the propranolol but restarted it at daily dosing instead of twice a day dosing.  She is no longer dizzy and the tremor is better.  She has some "jerking" and twitching spells; is just one single jerk of an arm or leg.  She is on gabapentin, 300 mg, 1 in the AM and 2 at night.  She is on that for psychiatric reasons.  Not sure it is helping.  Is seeing psychiatry but insurance will no longer pay for counseling, and she cannot afford it, although she thought it was beneficial.  She also has a history of seizure and is on Lamictal.  Unfortunately, she remains on Wellbutrin for depression.  She has been seizure free, however.  She still complains of memory issues.  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 She was supposed to have neuropsych testing completed before this  visit but it was never scheduled.    06/21/14 update:  Pt returns today for follow-up regarding tremor.  She is on a combination of low-dose propranolol, 40 mg daily, and primidone 50 mg nightly.  Last visit, the patient was describing what sounded like myoclonus.  I thought that perhaps it was secondary to her Neurontin, which is being prescribed by psychiatry.  It was decreased and the jerking went away.  She went off of the Wellbutrin, changed to cymbalta and increased it a week ago.  She is still depressed and asks about recommendations for counselors.  She had great luck with Dr. Lendell CapriceSullivan but it was just too far to travel for routine f/u.  She did have neuropsych testing with Dr. Lendell CapriceSullivan that demonstrated major depression, social phobia.  All memory testing was normal and no evidence of amnestic memory d/o.  No evidence of neurodegenerative process.  12/19/14 update:  Pt returns for follow up.  She is on propranolol 40 mg and propranolol 50 q hs.  Tremor has perhaps increased some, but she thinks maybe that is due to increased anxiety.  She really does not want to change her medications.  She does have a hx of depression.  [name] just doubled her cymbalta to 60 mg and her prozac was d/c.  It has only been 2 weeks.  She admits that her anxiety and depression have been fairly disabling.  She is not suicidal or homicidal.  She has not even been back to her primary care physician in over a year and a half.  She likes him but overall would rather have a female provider because of things that she has experienced in the past, and because she would rather have 1 provider who did her female exams.  She asks me about this today.  She d/c her BP med and her cholesterol med because she knew she had to go back to the dr for a refill and she was anxious about that.  She still does not have a Veterinary surgeoncounselor.  06/21/15 update:  The patient returns today for follow-up.  She has a history of essential tremor and is on  propranolol, 40 mg and primidone, 50 mg daily.  Tremor may be a little worse but "not bad like it was."  She reports that she has had more headaches; has a long hx of such.  Always R sided.  Is same headache she reports she saw Dr. Scarlette ShortsAdleman years ago.  She states that is pounding in nature.  No n/v.  She has photophobia but no phonophobia.  She takes aleve or advil.  She has noted that she has had to take that 4-5 days/week over the last few weeks.  States that it has been increased over the last month or so.  She continues to see [name] for her depression.  10/19/15  update:  The patient returns today for follow-up.  She has a history of essential tremor and is on propranolol, 40 mg and primidone, 50 mg daily.  She continues to see [name] for her depression.  Last visit, she was complaining about increased headache, but she did refuse a daily prophylactic medication even though she likely needed one.  I did give her a course of Medrol to see if I could break up the acute overlying headache that she was having.  She does state that the headaches had gone away for a while but are back again over the last month and they aren't as bad as it was.  She states that it made her feel anxious and just "weird."   She still doesn't want a preventative.  She states that these may be more stress as are in the bilateral frontal region and worse in the evening.  She is taking excessive advil on a daily basis.   She has had lexapro added to her lamictal and cymbalta for depression.  She is also on gabapentin but is only on per day.    03/14/16 update:  The patient returns today for follow-up.  She remains on propranolol, 40 mg daily and primidone, 50 mg nightly for essential tremor.   She has noted a bit more tremor but isn't sure she really wants to go up on medication.  She does have a history of chronic migraine, but has not wanted preventative medication for this.  These have been better since she quit taking advil daily.   She did end up going to see Cristen Saffo but she felt that she wasn't the right person for her and she cannot find someone now who takes her insurance.    08/14/16 update:  The patient returns today for follow-up.  She is still on primidone, 50 mg daily for essential tremor.  She is also on propranolol, 40 mg daily.  Tremor has been about the same.  C/O cramping in the legs and feet.  States that having some restlessness in the feet and legs but is intermittent.  On gabapentin 600 mg bid for psychiatric reasons.    She is still seeing [name] for her depression.  She denies suicidal or homicidal ideation.  She is now seeing Dr. Evalina FieldJane Perrin and really likes her and is pleased with that.    02/12/17 update:  Pt seen in f/u.  Remains on primidone 50 mg daily and propranolol 40 mg daily for ET.  Tremor has been stable.  Last visit, we checked labs because she was c/o RLS sx's.  She was noted to be iron deficient.  Iron supplementation recommended.  She is taking ferrous sulfate - 325 mg q day.  Her RLS is better.   She f/u with PCP in that regard as well and I reviewed his records.  She is having paresthesias in the hands and thinks that her CTS is getting bad.  She is still seeing Evalina FieldJane Perrin and likes her.  She has had 2-3 falls in last 18 months.   Doesn't know how falls are happening.  Some may be bad ankle as R ankle is fused.  [name] did EMG several years ago and was unremarkable but still thought that PN played a role (small fiber).  Headaches increased lately but on abx for sinus infection  08/18/17 update: Patient is seen in follow-up today.  She is on primidone, 50 mg daily.  She is also on propranolol, 40  mg daily for essential tremor.  She had a nerve conduction study since our last visit demonstrating carpal tunnel syndrome.  She was referred to Dr. Amanda Pea.  Her appt is 08/22/17.  Pts dad had a stroke the day after thanksgiving.  He is in rehab now.  Tremor may be a little worse but it is due to stress  with dad.  She is still struggling with hoarse voice.  Told by pulm it was from GERD.  She states that the cough resolved but her voice never got better.     PREVIOUS MEDICATIONS: n/a  All:  No Known Allergies  Current Outpatient Medications on File Prior to Visit  Medication Sig Dispense Refill  . acetaminophen (TYLENOL) 500 MG tablet Take 500 mg by mouth as needed.     Marland Kitchen albuterol (PROVENTIL HFA;VENTOLIN HFA) 108 (90 Base) MCG/ACT inhaler Inhale 2 puffs into the lungs every 6 (six) hours as needed for wheezing or shortness of breath. 1 Inhaler 2  . Ascorbic Acid (VITAMIN C) 1000 MG tablet Take 1,000 mg by mouth daily.    Marland Kitchen aspirin EC 81 MG tablet Take 81 mg by mouth every evening.      . DULoxetine (CYMBALTA) 60 MG capsule Take 60 mg by mouth 2 (two) times daily.     Marland Kitchen escitalopram (LEXAPRO) 10 MG tablet Take 10 mg by mouth daily.     . ferrous sulfate 325 (65 FE) MG tablet Take 325 mg by mouth daily with breakfast.    . gabapentin (NEURONTIN) 300 MG capsule Take 600 mg by mouth 2 (two) times daily.     Marland Kitchen lamoTRIgine (LAMICTAL) 150 MG tablet TAKE 1 TABLET BY MOUTH TWICE A DAY 180 tablet 1  . MAGNESIUM PO Take by mouth daily.    . Melatonin 3 MG TABS Take 10 mg by mouth at bedtime.     . Multiple Vitamin (MULTIVITAMIN) tablet Take 1 tablet by mouth daily.    . naproxen sodium (ANAPROX) 220 MG tablet Take 440 mg by mouth as needed.     . primidone (MYSOLINE) 50 MG tablet TAKE ONE TABLET BY MOUTH EVERY NIGHT AT BEDTIME 90 tablet 1  . propranolol (INDERAL) 40 MG tablet TAKE 1 TABLET BY MOUTH DAILY 90 tablet 1  . vitamin B-12 (CYANOCOBALAMIN) 1000 MCG tablet Take 500 mcg by mouth daily.      No current facility-administered medications on file prior to visit.    Past Medical History:  Diagnosis Date  . Amblyopia   . Benign essential tremor    worsened by anxiety  . Chronic pain due to injury    at R ankle due to ankle fracture  . Depression    followed by psych  . History of  hypertension   . HLD (hyperlipidemia)   . Imbalance    likely multifactorial (s/p balance training at [facility] 08/2010) ?CMT  . Iron deficiency anemia   . Right ankle pain 2003   trimalleolar fx s/p fusion  . Seizure (HCC)    focal, ex vacuo ventriculomegaly?, pending neuro w/u with sleep deprived EEG   Past Surgical History:  Procedure Laterality Date  . ANKLE FUSION  02/2004   Right-50% disability   . CESAREAN SECTION    . hospitalization  08/2011   syncope thought 2/2 seizure, MRI - Hyperintensity in the cerebral white matter bilaterally is nonspecific, started on Keppra  . ORIF ANKLE FRACTURE  06/2003   Right   Social History   Socioeconomic History  .  Marital status: Divorced    Spouse name: Not on file  . Number of children: 1  . Years of education: Not on file  . Highest education level: Not on file  Social Needs  . Financial resource strain: Not on file  . Food insecurity - worry: Not on file  . Food insecurity - inability: Not on file  . Transportation needs - medical: Not on file  . Transportation needs - non-medical: Not on file  Occupational History  . Occupation: Disabled    Comment: depression  . Occupation: DISABILITY    Employer: UNEMPLOYED  Tobacco Use  . Smoking status: Former Smoker    Packs/day: 1.00    Years: 20.00    Pack years: 20.00    Last attempt to quit: 08/12/1995    Years since quitting: 22.0  . Smokeless tobacco: Never Used  Substance and Sexual Activity  . Alcohol use: No    Alcohol/week: 0.0 oz    Comment: None for > 1 year  . Drug use: No  . Sexual activity: Not on file  Other Topics Concern  . Not on file  Social History Narrative   No caffeine. Divorced. 1 child. 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, although endorses that she falls a lot     ROS:  A complete 10 system review of systems was obtained and was unremarkable apart from what is mentioned  above.  PHYSICAL EXAMINATION:    Vitals:   08/18/17 1424  BP: 120/84  Pulse: 82  SpO2: 94%    GEN:  Normal appears female in no acute distress.  Appears stated age.  Appears generally anxious HEENT:  Normocephalic, atraumatic. The mucous membranes are moist. The superficial temporal arteries are without ropiness or tenderness.  No occipital notch tenderness CV:  RRR Lungs:  CTAB Neck:  No bruits   NEUROLOGICAL: Orientation:  The patient is alert and oriented x 3.   Cranial nerves: There is good facial symmetry.  Extraocular muscles are intact and visual fields are full to confrontational testing. Speech is fluent and clear but is very hoarse. Soft palate rises symmetrically and there is no tongue deviation. Hearing is intact to conversational tone. Tone: Tone is good throughout. Sensation: Sensation is intact to light touch throughout Coordination:  The patient has no difficulty with RAM's or FNF bilaterally.   Abnormal Movements:  There is minimal tremor of the outstretched hands that increases with intention. DTR:  2/4 at the bilateral biceps, triceps, brachioradialis, 3/4 at the bilateral patella. Motor: Strength is 5/5 in the bilateral upper and lower extremities.  Shoulder shrug is equal and symmetric. There is no pronator drift.  There are no fasciculations noted. Gait and Station: The patient ambulates without difficulty  Labs:   Wt Readings from Last 3 Encounters:  08/18/17 226 lb (102.5 kg)  06/12/17 221 lb (100.2 kg)  06/09/17 225 lb 4 oz (102.2 kg)   Lab Results  Component Value Date   VITAMINB12 777 01/06/2012   Lab Results  Component Value Date   TSH 4.33 09/13/2013     Chemistry      Component Value Date/Time   NA 137 08/14/2016 1342   K 4.2 08/14/2016 1342   CL 103 08/14/2016 1342   CO2 23 08/14/2016 1342   BUN 8 08/14/2016 1342   CREATININE 0.62 08/14/2016 1342      Component Value Date/Time   CALCIUM 8.8 08/14/2016 1342  ALKPHOS 101  08/14/2016 1342   AST 32 08/14/2016 1342   ALT 29 08/14/2016 1342   BILITOT 0.4 08/14/2016 1342     Lab Results  Component Value Date   WBC 4.7 06/12/2017   HGB 14.7 06/12/2017   HCT 43.3 06/12/2017   MCV 98.4 06/12/2017   PLT 227.0 06/12/2017   Lab Results  Component Value Date   FERRITIN 7 (L) 08/14/2016      IMPRESSION/PLAN  1. Essential Tremor, likely exacerbated by significant anxiety/depression.  -continue primidone 50 mg along with propranolol 40 mg.  Don't want to increase propranolol due to depression 2.  Possible seizure years ago.  EEG has been normal, but clinically it sounds as if the patient had a seizure.     -has been off of wellbutrin for years and has been seizure free 3.  Chronic migraine/chronic daily headache.    -increased lately due to sinus issues and on abx currently 4.  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  Neuropsych testing was negative in this regard, demonstrating only major depression  -seeing Dr. [name] and Evalina Field 5.  ? Myoclonus   -resolved with decreased gabapentin dose 6.  CTS  -has an appt with Dr. Amanda Pea on 08/22/17 7.  RLS and LE cramping  -due to iron deficiency anemia.      -now on iron supplementation.  RLS improved after started supplementation 8. Voice change  -encouraged her to f/u with ENT.  If from GERD and still unresolved despite tx, may need EGD.  To f/u with PCP 9.  Follow up is anticipated in the next 6 months, sooner should new neurologic issues arise.  Much greater than 50% of this visit was spent in counseling and coordinating care.  Total face to face time:  25 min

## 2017-08-18 ENCOUNTER — Other Ambulatory Visit: Payer: Self-pay | Admitting: Neurology

## 2017-08-18 ENCOUNTER — Ambulatory Visit: Payer: Medicare Other | Admitting: Neurology

## 2017-08-18 ENCOUNTER — Encounter: Payer: Self-pay | Admitting: Neurology

## 2017-08-18 VITALS — BP 120/84 | HR 82 | Ht 67.0 in | Wt 226.0 lb

## 2017-08-18 DIAGNOSIS — R490 Dysphonia: Secondary | ICD-10-CM | POA: Diagnosis not present

## 2017-08-18 DIAGNOSIS — G250 Essential tremor: Secondary | ICD-10-CM

## 2017-09-07 ENCOUNTER — Ambulatory Visit: Payer: Medicare Other | Admitting: Primary Care

## 2017-09-07 ENCOUNTER — Encounter: Payer: Self-pay | Admitting: Primary Care

## 2017-09-07 ENCOUNTER — Ambulatory Visit: Payer: Medicare Other | Admitting: Psychology

## 2017-09-07 VITALS — BP 122/72 | HR 90 | Temp 99.1°F | Wt 223.1 lb

## 2017-09-07 DIAGNOSIS — R6889 Other general symptoms and signs: Secondary | ICD-10-CM

## 2017-09-07 MED ORDER — OSELTAMIVIR PHOSPHATE 75 MG PO CAPS: 75.0000 mg | ORAL_CAPSULE | Freq: Two times a day (BID) | ORAL | 0 refills | Status: DC

## 2017-09-07 NOTE — Progress Notes (Signed)
Subjective:    Patient ID: Jamie Patterson, female    DOB: 04/02/1961, 57 y.o.   MRN: [id number]  HPI  Jamie Patterson is a 57 year old female with a history of seizure disorder, syncope, palpitations, chronic cough who presents today with a chief complaint of body aches.  She also reports chills, cough, headache. Her symptoms suddenly began 2 nights ago. She's not checking her temperature at home, but thinks she's running fevers. She's been taking Dayquil/Nyquil, Vicks, without much improvement. She denies sick contacts but has been in a nursing home visiting her father.      Review of Systems  Constitutional: Positive for chills, fatigue and fever.  HENT: Negative for congestion, ear pain and sinus pressure.   Respiratory: Positive for cough.   Musculoskeletal: Positive for myalgias.  Neurological: Positive for headaches.       Past Medical History:  Diagnosis Date  . Amblyopia   . Benign essential tremor    worsened by anxiety  . Chronic pain due to injury    at R ankle due to ankle fracture  . Depression    followed by psych  . History of hypertension   . HLD (hyperlipidemia)   . Imbalance    likely multifactorial (s/p balance training at [facility] 08/2010) ?CMT  . Iron deficiency anemia   . Right ankle pain 2003   trimalleolar fx s/p fusion  . Seizure (HCC)    focal, ex vacuo ventriculomegaly?, pending neuro w/u with sleep deprived EEG     Social History   Socioeconomic History  . Marital status: Divorced    Spouse name: Not on file  . Number of children: 1  . Years of education: Not on file  . Highest education level: Not on file  Social Needs  . Financial resource strain: Not on file  . Food insecurity - worry: Not on file  . Food insecurity - inability: Not on file  . Transportation needs - medical: Not on file  . Transportation needs - non-medical: Not on file  Occupational History  . Occupation: Disabled    Comment: depression  . Occupation: DISABILITY    Employer:  UNEMPLOYED  Tobacco Use  . Smoking status: Former Smoker    Packs/day: 1.00    Years: 20.00    Pack years: 20.00    Last attempt to quit: 08/12/1995    Years since quitting: 22.0  . Smokeless tobacco: Never Used  Substance and Sexual Activity  . Alcohol use: No    Alcohol/week: 0.0 oz    Comment: None for > 1 year  . Drug use: No  . Sexual activity: Not on file  Other Topics Concern  . Not on file  Social History Narrative   No caffeine. Divorced. 1 child. 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 Has a cane but hasn't needed it recently, although endorses that she falls a lot     Past Surgical History:  Procedure Laterality Date  . ANKLE FUSION  02/2004   Right-50% disability   . CESAREAN SECTION    . hospitalization  08/2011   syncope thought 2/2 seizure, MRI - Hyperintensity in the cerebral white matter bilaterally is nonspecific, started on Keppra  . ORIF ANKLE FRACTURE  06/2003   Right    Family History  Problem Relation Age of Onset  . Depression Mother   . Arrhythmia Mother [id number]       Dx with VTach after having sudden collapse, did NOT have  h/o AMI's   . Atrial fibrillation Father   . Hypertension Father   . Osteoarthritis Father   . Aortic aneurysm Father   . Hepatitis Brother        Hep. C  . Anemia Sister     No Known Allergies  Current Outpatient Medications on File Prior to Visit  Medication Sig Dispense Refill  . acetaminophen (TYLENOL) 500 MG tablet Take 500 mg by mouth as needed.     Marland Kitchen albuterol (PROVENTIL HFA;VENTOLIN HFA) 108 (90 Base) MCG/ACT inhaler Inhale 2 puffs into the lungs every 6 (six) hours as needed for wheezing or shortness of breath. 1 Inhaler 2  . Ascorbic Acid (VITAMIN C) 1000 MG tablet Take 1,000 mg by mouth daily.    Marland Kitchen aspirin EC 81 MG tablet Take 81 mg by mouth every evening.      . DULoxetine (CYMBALTA) 60 MG capsule Take 60 mg by mouth 2 (two) times daily.     Marland Kitchen escitalopram (LEXAPRO) 10 MG tablet Take 10 mg  by mouth daily.     . ferrous sulfate 325 (65 FE) MG tablet Take 325 mg by mouth daily with breakfast.    . gabapentin (NEURONTIN) 300 MG capsule Take 600 mg by mouth 2 (two) times daily.     Marland Kitchen lamoTRIgine (LAMICTAL) 150 MG tablet TAKE 1 TABLET BY MOUTH TWICE A DAY 180 tablet 1  . MAGNESIUM PO Take by mouth daily.    . Melatonin 3 MG TABS Take 10 mg by mouth at bedtime.     . Multiple Vitamin (MULTIVITAMIN) tablet Take 1 tablet by mouth daily.    . naproxen sodium (ANAPROX) 220 MG tablet Take 440 mg by mouth as needed.     . primidone (MYSOLINE) 50 MG tablet TAKE ONE TABLET BY MOUTH EVERY NIGHT AT BEDTIME 90 tablet 1  . propranolol (INDERAL) 40 MG tablet TAKE 1 TABLET BY MOUTH DAILY 90 tablet 1  . vitamin B-12 (CYANOCOBALAMIN) 1000 MCG tablet Take 500 mcg by mouth daily.      No current facility-administered medications on file prior to visit.     BP 122/72   Pulse 90   Temp 99.1 F (37.3 C) (Oral)   Wt 223 lb 1.9 oz (101.2 kg)   LMP 06/01/2013   SpO2 98%   BMI 34.95 kg/m    Objective:   Physical Exam  Constitutional: She appears well-nourished. She appears ill.  HENT:  Right Ear: Tympanic membrane and ear canal normal.  Left Ear: Tympanic membrane and ear canal normal.  Nose: Right sinus exhibits no maxillary sinus tenderness and no frontal sinus tenderness. Left sinus exhibits no maxillary sinus tenderness and no frontal sinus tenderness.  Mouth/Throat: Oropharynx is clear and moist.  Eyes: Conjunctivae are normal.  Neck: Neck supple.  Cardiovascular: Normal rate and regular rhythm.  Pulmonary/Chest: Effort normal. She has no rales.  Mild wheezing to bilateral upper fields  Lymphadenopathy:    She has no cervical adenopathy.  Skin: Skin is warm.  Mildly diaphoretic           Assessment & Plan:  Flu-Like Symptoms:  Sudden onset of chills, body aches, sweats x 2 nights ago. Exam today consistent for viral involvement, likely influenza. Did not test today given  limited supply of testing kits, she does present today as having influenza virus. Rx for Tamiflu sent to pharmacy. Discussed this will not cure flu but will hopefully decrease duration of symptoms. Continue Delsym, fluids, rest.  Doreene Nest,  NP

## 2017-09-07 NOTE — Patient Instructions (Addendum)
Start Tamiflu. Take 1 capsule by mouth twice daily for 5 days.  Ensure you stay hydrated with water and rest.   It was a pleasure to see you today!

## 2017-09-29 ENCOUNTER — Other Ambulatory Visit: Payer: Self-pay | Admitting: Neurology

## 2017-09-29 MED ORDER — PROPRANOLOL HCL 40 MG PO TABS: 40.0000 mg | ORAL_TABLET | Freq: Every day | ORAL | 1 refills | Status: DC

## 2017-10-28 ENCOUNTER — Ambulatory Visit (INDEPENDENT_AMBULATORY_CARE_PROVIDER_SITE_OTHER): Payer: Medicare Other | Admitting: Psychology

## 2017-10-28 DIAGNOSIS — F331 Major depressive disorder, recurrent, moderate: Secondary | ICD-10-CM

## 2017-10-28 DIAGNOSIS — F449 Dissociative and conversion disorder, unspecified: Secondary | ICD-10-CM | POA: Diagnosis not present

## 2017-11-23 ENCOUNTER — Other Ambulatory Visit: Payer: Self-pay | Admitting: Neurology

## 2017-11-23 MED ORDER — PRIMIDONE 50 MG PO TABS: 50.0000 mg | ORAL_TABLET | Freq: Every day | ORAL | 1 refills | Status: DC

## 2017-12-01 ENCOUNTER — Ambulatory Visit: Payer: Medicare Other | Admitting: Psychology

## 2017-12-09 HISTORY — PX: CARPAL TUNNEL RELEASE: SHX101

## 2017-12-28 ENCOUNTER — Encounter: Payer: Self-pay | Admitting: Family Medicine

## 2017-12-30 ENCOUNTER — Ambulatory Visit (INDEPENDENT_AMBULATORY_CARE_PROVIDER_SITE_OTHER): Payer: Medicare Other | Admitting: Psychology

## 2017-12-30 DIAGNOSIS — F331 Major depressive disorder, recurrent, moderate: Secondary | ICD-10-CM

## 2017-12-30 DIAGNOSIS — F449 Dissociative and conversion disorder, unspecified: Secondary | ICD-10-CM

## 2018-02-12 NOTE — Progress Notes (Signed)
Jamie Patterson was seen today in neurologic consultation at the request of [name], MD and [name].  Since [name] is no longer with the practice, I will be resuming the patient's care.  I have reviewed the patient's prior medical records.    The patient has had 2 episodes of what sound like seizure.  These consisted of a vocalization, falling and generalized shaking.  An MRI of the brain was done and revealed multiple T2 hyperintensities, with some concern for demyelinating disease.  She subsequently had a lumbar puncture that was negative for oligoclonal bands.  She was placed on Keppra in addition to the Lamictal that she was already on for depression.  She subsequently had a sleep deprived EEG that was negative and therefore her Keppra was weaned and her Lamictal was increased, in hopes that that could be used as monotherapy for potential seizure and depression.  She remains on Wellbutrin, but she did talk with her psychiatrist about this.  They felt that the benefits outweigh the risks.  She was also placed on Neurontin since last visit.  She has only been on it since Friday.  She was on this for anxiety.  She does not know if it is helpful yet.  The patient also has tremor and after talking with [name], the patients psychiatrist, [name] started the patient on propranolol 20 mg, 2 tablets times a day.  The patient reports that it has helped dramatically, at least 75% helpful.  She reports that tremor began after a w/d from oxycontin and valium (she went off of it "cold [place]" and did not realize that she was "addicted" and should wean it).  She may have had tremor before then but worse after that.  She could feel the tremor inside and that is much better. She is no longer having trouble with activities of daily living like she was. She still has some dizziness if she stands up quickly, bends over or turns quickly.  She has adjusted to this fairly well.  She continues to complain of chronic daily  headache.  The headaches are holocephalic.  She has daily nausea and she is not sure if that is from the headache from the multiple medications.  She does not have vomiting.  There has been no photophobia.  She does have a history of migraine.  06/14/13 update:  The patient is following up today regarding her essential tremor.  She is currently on propranolol 40 mg twice per day.  She continues to have some dizziness if she stands up quickly, bends over or turns.  She has had no seizures since our last visit.  She does remain on Wellbutrin, and understands that this lower seizure threshold.  She has talked about this with her psychiatrist and they think that the benefits outweigh risks.  She is very concerned about memory changes.  She is having word finding difficulty.  She admits to continued difficulty with uncontrolled depression, but states that she is not suicidal or homicidal.  12/19/13 update:  The patient is following up on essential tremor.  She is currently on Mysoline, and last visit we tried to stop the propranolol because of dizziness.  Unfortunately, tremor increased and we had to restart the propranolol but restarted it at daily dosing instead of twice a day dosing.  She is no longer dizzy and the tremor is better.  She has some "jerking" and twitching spells; is just one single jerk of an arm or leg.  She is on gabapentin, 300 mg, 1 in the AM and 2 at night.  She is on that for psychiatric reasons.  Not sure it is helping.  Is seeing psychiatry but insurance will no longer pay for counseling, and she cannot afford it, although she thought it was beneficial.  She also has a history of seizure and is on Lamictal.  Unfortunately, she remains on Wellbutrin for depression.  She has been seizure free, however.  She still complains of memory issues.  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 She was supposed to have neuropsych testing completed before this  visit but it was never scheduled.    06/21/14 update:  Jamie Patterson returns today for follow-up regarding tremor.  She is on a combination of low-dose propranolol, 40 mg daily, and primidone 50 mg nightly.  Last visit, the patient was describing what sounded like myoclonus.  I thought that perhaps it was secondary to her Neurontin, which is being prescribed by psychiatry.  It was decreased and the jerking went away.  She went off of the Wellbutrin, changed to cymbalta and increased it a week ago.  She is still depressed and asks about recommendations for counselors.  She had great luck with Dr. Lendell CapriceSullivan but it was just too far to travel for routine f/u.  She did have neuropsych testing with Dr. Lendell CapriceSullivan that demonstrated major depression, social phobia.  All memory testing was normal and no evidence of amnestic memory d/o.  No evidence of neurodegenerative process.  12/19/14 update:  Jamie Patterson returns for follow up.  She is on propranolol 40 mg and propranolol 50 q hs.  Tremor has perhaps increased some, but she thinks maybe that is due to increased anxiety.  She really does not want to change her medications.  She does have a hx of depression.  [name] just doubled her cymbalta to 60 mg and her prozac was d/c.  It has only been 2 weeks.  She admits that her anxiety and depression have been fairly disabling.  She is not suicidal or homicidal.  She has not even been back to her primary care physician in over a year and a half.  She likes him but overall would rather have a female provider because of things that she has experienced in the past, and because she would rather have 1 provider who did her female exams.  She asks me about this today.  She d/c her BP med and her cholesterol med because she knew she had to go back to the dr for a refill and she was anxious about that.  She still does not have a Veterinary surgeoncounselor.  06/21/15 update:  The patient returns today for follow-up.  She has a history of essential tremor and is on  propranolol, 40 mg and primidone, 50 mg daily.  Tremor may be a little worse but "not bad like it was."  She reports that she has had more headaches; has a long hx of such.  Always R sided.  Is same headache she reports she saw Dr. Scarlette ShortsAdleman years ago.  She states that is pounding in nature.  No n/v.  She has photophobia but no phonophobia.  She takes aleve or advil.  She has noted that she has had to take that 4-5 days/week over the last few weeks.  States that it has been increased over the last month or so.  She continues to see [name] for her depression.  10/19/15  update:  The patient returns today for follow-up.  She has a history of essential tremor and is on propranolol, 40 mg and primidone, 50 mg daily.  She continues to see [name] for her depression.  Last visit, she was complaining about increased headache, but she did refuse a daily prophylactic medication even though she likely needed one.  I did give her a course of Medrol to see if I could break up the acute overlying headache that she was having.  She does state that the headaches had gone away for a while but are back again over the last month and they aren't as bad as it was.  She states that it made her feel anxious and just "weird."   She still doesn't want a preventative.  She states that these may be more stress as are in the bilateral frontal region and worse in the evening.  She is taking excessive advil on a daily basis.   She has had lexapro added to her lamictal and cymbalta for depression.  She is also on gabapentin but is only on per day.    03/14/16 update:  The patient returns today for follow-up.  She remains on propranolol, 40 mg daily and primidone, 50 mg nightly for essential tremor.   She has noted a bit more tremor but isn't sure she really wants to go up on medication.  She does have a history of chronic migraine, but has not wanted preventative medication for this.  These have been better since she quit taking advil daily.   She did end up going to see Cristen Saffo but she felt that she wasn't the right person for her and she cannot find someone now who takes her insurance.    08/14/16 update:  The patient returns today for follow-up.  She is still on primidone, 50 mg daily for essential tremor.  She is also on propranolol, 40 mg daily.  Tremor has been about the same.  C/O cramping in the legs and feet.  States that having some restlessness in the feet and legs but is intermittent.  On gabapentin 600 mg bid for psychiatric reasons.    She is still seeing [name] for her depression.  She denies suicidal or homicidal ideation.  She is now seeing Dr. Evalina FieldJane Perrin and really likes her and is pleased with that.    02/12/17 update:  Jamie Patterson seen in f/u.  Remains on primidone 50 mg daily and propranolol 40 mg daily for ET.  Tremor has been stable.  Last visit, we checked labs because she was c/o RLS sx's.  She was noted to be iron deficient.  Iron supplementation recommended.  She is taking ferrous sulfate - 325 mg q day.  Her RLS is better.   She f/u with PCP in that regard as well and I reviewed his records.  She is having paresthesias in the hands and thinks that her CTS is getting bad.  She is still seeing Evalina FieldJane Perrin and likes her.  She has had 2-3 falls in last 18 months.   Doesn't know how falls are happening.  Some may be bad ankle as R ankle is fused.  [name] did EMG several years ago and was unremarkable but still thought that PN played a role (small fiber).  Headaches increased lately but on abx for sinus infection  08/18/17 update: Patient is seen in follow-up today.  She is on primidone, 50 mg daily.  She is also on propranolol, 40  mg daily for essential tremor.  She had a nerve conduction study since our last visit demonstrating carpal tunnel syndrome.  She was referred to Dr. Amanda Pea.  Her appt is 08/22/17.  Pts dad had a stroke the day after thanksgiving.  He is in rehab now.  Tremor may be a little worse but it is due to stress  with dad.  She is still struggling with hoarse voice.  Told by pulm it was from GERD.  She states that the cough resolved but her voice never got better.    02/16/18 update: Patient is seen today in follow-up for tremor.  She is on primidone, 50 mg daily and propranolol, 40 mg daily.  Tremor has been a bit worse but its related to increased anxiety as dad in a SNF and trying to get him on medicaid and having to hire to attorney to help her do so.  Otherwise, doing well.  Is engaged.  Enjoying time with grandchildren.  She did see Dr. Amanda Pea since her last visit and she had CTR and had trigger thumb released.  Pleased with results   PREVIOUS MEDICATIONS: n/a  All:  No Known Allergies  Current Outpatient Medications on File Prior to Visit  Medication Sig Dispense Refill  . acetaminophen (TYLENOL) 500 MG tablet Take 500 mg by mouth as needed.     Marland Kitchen albuterol (PROVENTIL HFA;VENTOLIN HFA) 108 (90 Base) MCG/ACT inhaler Inhale 2 puffs into the lungs every 6 (six) hours as needed for wheezing or shortness of breath. 1 Inhaler 2  . Ascorbic Acid (VITAMIN C) 1000 MG tablet Take 1,000 mg by mouth daily.    Marland Kitchen aspirin EC 81 MG tablet Take 81 mg by mouth every evening.      . DULoxetine (CYMBALTA) 60 MG capsule Take 60 mg by mouth 2 (two) times daily.     Marland Kitchen escitalopram (LEXAPRO) 10 MG tablet Take 10 mg by mouth daily.     . ferrous sulfate 325 (65 FE) MG tablet Take 325 mg by mouth daily with breakfast.    . gabapentin (NEURONTIN) 300 MG capsule Take 600 mg by mouth 2 (two) times daily.     Marland Kitchen lamoTRIgine (LAMICTAL) 150 MG tablet TAKE 1 TABLET BY MOUTH TWICE A DAY 180 tablet 1  . MAGNESIUM PO Take by mouth daily.    . Melatonin 3 MG TABS Take 10 mg by mouth at bedtime.     . Multiple Vitamin (MULTIVITAMIN) tablet Take 1 tablet by mouth daily.    . naproxen sodium (ANAPROX) 220 MG tablet Take 440 mg by mouth as needed.     Marland Kitchen tiZANidine (ZANAFLEX) 2 MG tablet Take 2 mg by mouth at bedtime.    . vitamin B-12  (CYANOCOBALAMIN) 1000 MCG tablet Take 500 mcg by mouth daily.      No current facility-administered medications on file prior to visit.    Past Medical History:  Diagnosis Date  . Amblyopia   . Benign essential tremor    worsened by anxiety  . Chronic pain due to injury    at R ankle due to ankle fracture  . Depression    followed by psych  . History of hypertension   . HLD (hyperlipidemia)   . Imbalance    likely multifactorial (s/p balance training at [facility] 08/2010) ?CMT  . Iron deficiency anemia   . Right ankle pain 2003   trimalleolar fx s/p fusion  . Seizure (HCC)    focal, ex vacuo ventriculomegaly?, pending neuro  w/u with sleep deprived EEG   Past Surgical History:  Procedure Laterality Date  . ANKLE FUSION  02/2004   Right-50% disability   . CARPAL TUNNEL RELEASE  12/2017   L CTR repair, R thumb A1 pulley release (Gramig)  . CESAREAN SECTION    . hospitalization  08/2011   syncope thought 2/2 seizure, MRI - Hyperintensity in the cerebral white matter bilaterally is nonspecific, started on Keppra  . ORIF ANKLE FRACTURE  06/2003   Right   Social History   Socioeconomic History  . Marital status: Divorced    Spouse name: Not on file  . Number of children: 1  . Years of education: Not on file  . Highest education level: Not on file  Occupational History  . Occupation: Disabled    Comment: depression  . Occupation: DISABILITY    Employer: UNEMPLOYED  Social Needs  . Financial resource strain: Not on file  . Food insecurity:    Worry: Not on file    Inability: Not on file  . Transportation needs:    Medical: Not on file    Non-medical: Not on file  Tobacco Use  . Smoking status: Former Smoker    Packs/day: 1.00    Years: 20.00    Pack years: 20.00    Last attempt to quit: 08/12/1995    Years since quitting: 22.5  . Smokeless tobacco: Never Used  Substance and Sexual Activity  . Alcohol use: No    Alcohol/week: 0.0 oz    Comment: None for > 1 year  .  Drug use: No  . Sexual activity: Not on file  Lifestyle  . Physical activity:    Days per week: Not on file    Minutes per session: Not on file  . Stress: Not on file  Relationships  . Social connections:    Talks on phone: Not on file    Gets together: Not on file    Attends religious service: Not on file    Active member of club or organization: Not on file    Attends meetings of clubs or organizations: Not on file    Relationship status: Not on file  . Intimate partner violence:    Fear of current or ex partner: Not on file    Emotionally abused: Not on file    Physically abused: Not on file    Forced sexual activity: Not on file  Other Topics Concern  . Not on file  Social History Narrative   No caffeine. Divorced. 1 child. 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, although endorses that she falls a lot     ROS:  A complete 10 system review of systems was obtained and was unremarkable apart from what is mentioned above.  PHYSICAL EXAMINATION:    Vitals:   02/16/18 1251  BP: (!) 138/92  Pulse: 80  SpO2: 98%   GEN:  The patient appears stated age and is in NAD. HEENT:  Normocephalic, atraumatic.  The mucous membranes are moist. The superficial temporal arteries are without ropiness or tenderness. CV:  RRR Lungs:  CTAB Neck/HEME:  There are no carotid bruits bilaterally.  Neurological examination:  Orientation: The patient is alert and oriented x3. Cranial nerves: There is good facial symmetry. The speech is fluent and clear. Soft palate rises symmetrically and there is no tongue deviation. Hearing is intact to conversational tone. Sensation: Sensation is intact to light touch throughout Motor:  Strength is 5/5 in the bilateral upper and lower extremities.   Shoulder shrug is equal and symmetric.  There is no pronator drift.  Movement examination: Tone: There is normal tone in the upper and lower  extremities.   Abnormal movements: None noted today Gait and Station: The patient ambulates well   Labs:   Wt Readings from Last 3 Encounters:  02/16/18 209 lb (94.8 kg)  09/07/17 223 lb 1.9 oz (101.2 kg)  08/18/17 226 lb (102.5 kg)   Lab Results  Component Value Date   VITAMINB12 777 01/06/2012   Lab Results  Component Value Date   TSH 4.33 09/13/2013     Chemistry      Component Value Date/Time   NA 137 08/14/2016 1342   K 4.2 08/14/2016 1342   CL 103 08/14/2016 1342   CO2 23 08/14/2016 1342   BUN 8 08/14/2016 1342   CREATININE 0.62 08/14/2016 1342      Component Value Date/Time   CALCIUM 8.8 08/14/2016 1342   ALKPHOS 101 08/14/2016 1342   AST 32 08/14/2016 1342   ALT 29 08/14/2016 1342   BILITOT 0.4 08/14/2016 1342     Lab Results  Component Value Date   WBC 4.7 06/12/2017   HGB 14.7 06/12/2017   HCT 43.3 06/12/2017   MCV 98.4 06/12/2017   PLT 227.0 06/12/2017   Lab Results  Component Value Date   FERRITIN 7 (L) 08/14/2016      IMPRESSION/PLAN  1. Essential Tremor, likely exacerbated by significant anxiety/depression.  -continue primidone 50 mg along with propranolol 40 mg.  Don't want to increase propranolol due to depression  -Check chemistry and TSH today. 2.  Possible seizure years ago.  EEG has been normal, but clinically it sounds as if the patient had a seizure.     -has been off of wellbutrin for years and has been seizure free 3.  Chronic migraine/chronic daily headache.    -increased lately due to sinus issues and on abx currently 4.  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  Neuropsych testing was negative in this regard, demonstrating only major depression  -seeing Dr. [name] and Evalina Field 5.  ? Myoclonus   -resolved with decreased gabapentin dose 6.  CTS  -Status post carpal tunnel release with Dr. Amanda Pea. 7.  RLS and LE cramping  -due to iron deficiency anemia.      -now on iron supplementation.  Will  recheck lab work today in that regard. 8.  Long talk with patient today about making sure that she is staying up with her general medical checkups.  She has not been staying faithful with her regular physical exams.  She has not been faithful with her mammograms/female wellness exams.  I gave her the telephone number to [facility] for her to make that appointment.  She needs to talk to her primary care physician about screening colonoscopy. 9.  Follow-up with me in 9 months, sooner should new neurologic issues arise.  Much greater than 50% of this visit was spent in counseling and coordinating care.  Total face to face time:  25 min

## 2018-02-16 ENCOUNTER — Other Ambulatory Visit: Payer: Medicare Other

## 2018-02-16 ENCOUNTER — Ambulatory Visit: Payer: Medicare Other | Admitting: Neurology

## 2018-02-16 ENCOUNTER — Encounter: Payer: Self-pay | Admitting: Neurology

## 2018-02-16 VITALS — BP 138/92 | HR 80 | Ht 67.0 in | Wt 209.0 lb

## 2018-02-16 DIAGNOSIS — D509 Iron deficiency anemia, unspecified: Secondary | ICD-10-CM

## 2018-02-16 DIAGNOSIS — Z5181 Encounter for therapeutic drug level monitoring: Secondary | ICD-10-CM

## 2018-02-16 DIAGNOSIS — G250 Essential tremor: Secondary | ICD-10-CM

## 2018-02-16 MED ORDER — PRIMIDONE 50 MG PO TABS: 50.0000 mg | ORAL_TABLET | Freq: Every day | ORAL | 2 refills | Status: DC

## 2018-02-16 MED ORDER — PROPRANOLOL HCL 40 MG PO TABS: 40.0000 mg | ORAL_TABLET | Freq: Every day | ORAL | 2 refills | Status: DC

## 2018-02-16 NOTE — Patient Instructions (Signed)
1. Your provider has requested that you have labwork completed today. Please go to [facility] ([id number]) on the second floor of this building before leaving the office today. You do not need to check in. If you are not called within 15 minutes please check with the front desk.   2. [facility] can be reached at [phone].

## 2018-02-17 ENCOUNTER — Ambulatory Visit (INDEPENDENT_AMBULATORY_CARE_PROVIDER_SITE_OTHER): Payer: Medicare Other | Admitting: Psychology

## 2018-02-17 ENCOUNTER — Telehealth: Payer: Self-pay | Admitting: Neurology

## 2018-02-17 DIAGNOSIS — F331 Major depressive disorder, recurrent, moderate: Secondary | ICD-10-CM

## 2018-02-17 DIAGNOSIS — E875 Hyperkalemia: Secondary | ICD-10-CM

## 2018-02-17 DIAGNOSIS — F449 Dissociative and conversion disorder, unspecified: Secondary | ICD-10-CM | POA: Diagnosis not present

## 2018-02-17 LAB — COMPREHENSIVE METABOLIC PANEL
AG Ratio: 2.4 (calc) (ref 1.0–2.5)
ALT: 17 U/L (ref 6–29)
AST: 22 U/L (ref 10–35)
Albumin: 4.7 g/dL (ref 3.6–5.1)
Alkaline phosphatase (APISO): 109 U/L (ref 33–130)
BILIRUBIN TOTAL: 0.4 mg/dL (ref 0.2–1.2)
BUN: 12 mg/dL (ref 7–25)
CALCIUM: 10.3 mg/dL (ref 8.6–10.4)
CO2: 29 mmol/L (ref 20–32)
Chloride: 102 mmol/L (ref 98–110)
Creat: 0.67 mg/dL (ref 0.50–1.05)
GLUCOSE: 92 mg/dL (ref 65–99)
Globulin: 2 g/dL (calc) (ref 1.9–3.7)
Potassium: 5.5 mmol/L — ABNORMAL HIGH (ref 3.5–5.3)
SODIUM: 139 mmol/L (ref 135–146)
TOTAL PROTEIN: 6.7 g/dL (ref 6.1–8.1)

## 2018-02-17 LAB — CBC WITH DIFFERENTIAL/PLATELET
Basophils Absolute: 32 cells/uL (ref 0–200)
Basophils Relative: 0.6 %
Eosinophils Absolute: 90 cells/uL (ref 15–500)
Eosinophils Relative: 1.7 %
HEMATOCRIT: 43.8 % (ref 35.0–45.0)
Hemoglobin: 15 g/dL (ref 11.7–15.5)
LYMPHS ABS: 1685 {cells}/uL (ref 850–3900)
MCH: 32.5 pg (ref 27.0–33.0)
MCHC: 34.2 g/dL (ref 32.0–36.0)
MCV: 95 fL (ref 80.0–100.0)
MPV: 10 fL (ref 7.5–12.5)
Monocytes Relative: 7.7 %
NEUTROS PCT: 58.2 %
Neutro Abs: 3085 cells/uL (ref 1500–7800)
Platelets: 271 10*3/uL (ref 140–400)
RBC: 4.61 10*6/uL (ref 3.80–5.10)
RDW: 12.2 % (ref 11.0–15.0)
Total Lymphocyte: 31.8 %
WBC: 5.3 10*3/uL (ref 3.8–10.8)
WBCMIX: 408 {cells}/uL (ref 200–950)

## 2018-02-17 LAB — IRON,TIBC AND FERRITIN PANEL
%SAT: 19 % (ref 16–45)
FERRITIN: 75 ng/mL (ref 16–232)
IRON: 61 ug/dL (ref 45–160)
TIBC: 327 mcg/dL (calc) (ref 250–450)

## 2018-02-17 LAB — TSH: TSH: 3.34 m[IU]/L (ref 0.40–4.50)

## 2018-02-17 NOTE — Telephone Encounter (Signed)
Patient made aware. Order entered and she will have lab redrawn in a week. She knows to come by our office to check in.

## 2018-02-17 NOTE — Telephone Encounter (Signed)
-----   Message from Octaviano Battyebecca S Tat, DO sent at 02/17/2018  9:09 AM EDT ----- Let pt know that labs ok except K is a bit high.  Hold K rich foods like bananas and repeat just K in a week

## 2018-03-03 ENCOUNTER — Ambulatory Visit (INDEPENDENT_AMBULATORY_CARE_PROVIDER_SITE_OTHER): Payer: Medicare Other | Admitting: Psychology

## 2018-03-03 DIAGNOSIS — F449 Dissociative and conversion disorder, unspecified: Secondary | ICD-10-CM | POA: Diagnosis not present

## 2018-03-03 DIAGNOSIS — F3110 Bipolar disorder, current episode manic without psychotic features, unspecified: Secondary | ICD-10-CM | POA: Diagnosis not present

## 2018-03-19 ENCOUNTER — Ambulatory Visit: Payer: Medicare Other | Admitting: Psychology

## 2018-04-01 ENCOUNTER — Encounter: Payer: Self-pay | Admitting: Internal Medicine

## 2018-04-01 ENCOUNTER — Ambulatory Visit: Payer: Medicare Other | Admitting: Internal Medicine

## 2018-04-01 VITALS — BP 126/82 | HR 87 | Temp 98.1°F | Wt 209.0 lb

## 2018-04-01 DIAGNOSIS — R51 Headache: Secondary | ICD-10-CM

## 2018-04-01 DIAGNOSIS — R519 Headache, unspecified: Secondary | ICD-10-CM

## 2018-04-01 DIAGNOSIS — J020 Streptococcal pharyngitis: Secondary | ICD-10-CM

## 2018-04-01 DIAGNOSIS — J029 Acute pharyngitis, unspecified: Secondary | ICD-10-CM | POA: Diagnosis not present

## 2018-04-01 MED ORDER — CEFDINIR 300 MG PO CAPS: 300.0000 mg | ORAL_CAPSULE | Freq: Two times a day (BID) | ORAL | 0 refills | Status: DC

## 2018-04-01 NOTE — Patient Instructions (Signed)

## 2018-04-01 NOTE — Progress Notes (Signed)
HPI  Pt presents to the clinic today with c/o headache and sore throat. She reports this started 4 days ago. She describes the headache as pressure in the forehead. She is having difficulty swallowing. She denies runny nose, nasal congestion, ear pain or cough. She denies fever but has had chills and body aches. She has tried Ibuprofen and Tylenol OTC. She reports she was treated for strep 3 weeks ago, and has multiple reexposure through her kids and grandkids since that time. She denies history of allergies or breathing problems.   Review of Systems      Past Medical History:  Diagnosis Date  . Amblyopia   . Benign essential tremor    worsened by anxiety  . Chronic pain due to injury    at R ankle due to ankle fracture  . Depression    followed by psych  . History of hypertension   . HLD (hyperlipidemia)   . Imbalance    likely multifactorial (s/p balance training at [facility] 08/2010) ?CMT  . Iron deficiency anemia   . Right ankle pain 2003   trimalleolar fx s/p fusion  . Seizure (HCC)    focal, ex vacuo ventriculomegaly?, pending neuro w/u with sleep deprived EEG    Family History  Problem Relation Age of Onset  . Depression Mother   . Arrhythmia Mother 11       Dx with VTach after having sudden collapse, did NOT have h/o AMI's   . Atrial fibrillation Father   . Hypertension Father   . Osteoarthritis Father   . Aortic aneurysm Father   . Hepatitis Brother        Hep. C  . Anemia Sister     Social History   Socioeconomic History  . Marital status: Divorced    Spouse name: Not on file  . Number of children: 1  . Years of education: Not on file  . Highest education level: Not on file  Occupational History  . Occupation: Disabled    Comment: depression  . Occupation: DISABILITY    Employer: UNEMPLOYED  Social Needs  . Financial resource strain: Not on file  . Food insecurity:    Worry: Not on file    Inability: Not on file  . Transportation needs:    Medical: Not  on file    Non-medical: Not on file  Tobacco Use  . Smoking status: Former Smoker    Packs/day: 1.00    Years: 20.00    Pack years: 20.00    Last attempt to quit: 08/12/1995    Years since quitting: 22.6  . Smokeless tobacco: Never Used  Substance and Sexual Activity  . Alcohol use: No    Alcohol/week: 0.0 standard drinks    Comment: None for > 1 year  . Drug use: No  . Sexual activity: Not on file  Lifestyle  . Physical activity:    Days per week: Not on file    Minutes per session: Not on file  . Stress: Not on file  Relationships  . Social connections:    Talks on phone: Not on file    Gets together: Not on file    Attends religious service: Not on file    Active member of club or organization: Not on file    Attends meetings of clubs or organizations: Not on file    Relationship status: Not on file  . Intimate partner violence:    Fear of current or ex partner: Not on file  Emotionally abused: Not on file    Physically abused: Not on file    Forced sexual activity: Not on file  Other Topics Concern  . Not on file  Social History Narrative   No caffeine. Divorced. 1 child. 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 Has a cane but hasn't needed it recently, although endorses that she falls a lot     No Known Allergies   Constitutional: Positive headache. Denies fever or abrupt weight changes.  HEENT:  Positive sore throat. Denies eye redness, eye pain, pressure behind the eyes, facial pain, nasal congestion, ear pain, ringing in the ears, wax buildup, runny nose or bloody nose. Respiratory: Denies cough, difficulty breathing or shortness of breath.  Cardiovascular: Denies chest pain, chest tightness, palpitations or swelling in the hands or feet.   No other specific complaints in a complete review of systems (except as listed in HPI above).  Objective:   BP 126/82   Pulse 87   Temp 98.1 F (36.7 C) (Oral)   Wt 209 lb (94.8 kg)   LMP  06/01/2013   SpO2 98%   BMI 32.73 kg/m  Wt Readings from Last 3 Encounters:  04/01/18 209 lb (94.8 kg)  02/16/18 209 lb (94.8 kg)  09/07/17 223 lb 1.9 oz (101.2 kg)     General: Appears her stated age, in NAD. HEENT: Head: normal shape and size; Throat/Mouth: Teeth present, mucosa erythematous and moist, no exudate noted, no lesions or ulcerations noted.  Neck: Bilateral anterior cervical lymphadenopathy.  Pulmonary/Chest: Normal effort and positive vesicular breath sounds. No respiratory distress. No wheezes, rales or ronchi noted.       Assessment & Plan:   Headache, Sore Throat secondary to Strep Throat:  RST: positive Get some rest and drink plenty of water Do salt water gargles for the sore throat eRx for Omnicef 300 mg 3 x day for 10 days 80 mg Depo IM today  RTC as needed or if symptoms persist.   Jamie Reaperegina Riki Berninger, NP

## 2018-04-04 ENCOUNTER — Encounter: Payer: Self-pay | Admitting: Internal Medicine

## 2018-04-04 MED ORDER — METHYLPREDNISOLONE ACETATE 80 MG/ML IJ SUSP: 80.0000 mg | Freq: Once | INTRAMUSCULAR | Status: DC

## 2018-04-09 ENCOUNTER — Ambulatory Visit (INDEPENDENT_AMBULATORY_CARE_PROVIDER_SITE_OTHER): Payer: Medicare Other | Admitting: Psychology

## 2018-04-09 DIAGNOSIS — F449 Dissociative and conversion disorder, unspecified: Secondary | ICD-10-CM

## 2018-05-26 ENCOUNTER — Ambulatory Visit (INDEPENDENT_AMBULATORY_CARE_PROVIDER_SITE_OTHER): Payer: Medicare Other | Admitting: Psychology

## 2018-05-26 DIAGNOSIS — F449 Dissociative and conversion disorder, unspecified: Secondary | ICD-10-CM | POA: Diagnosis not present

## 2018-05-26 DIAGNOSIS — F331 Major depressive disorder, recurrent, moderate: Secondary | ICD-10-CM

## 2018-06-07 ENCOUNTER — Telehealth: Payer: Self-pay

## 2018-06-07 NOTE — Telephone Encounter (Signed)
Patient currently on her honeymoon and will call back to schedule AWV and CPE and if she does not I will call her back in a week or so per patients request.

## 2018-06-17 ENCOUNTER — Ambulatory Visit (INDEPENDENT_AMBULATORY_CARE_PROVIDER_SITE_OTHER): Payer: Medicare Other | Admitting: Psychology

## 2018-06-17 DIAGNOSIS — F331 Major depressive disorder, recurrent, moderate: Secondary | ICD-10-CM

## 2018-06-21 NOTE — Telephone Encounter (Signed)
Tried calling and phone call was dropped

## 2018-06-23 NOTE — Telephone Encounter (Signed)
Letter mailed to the patient

## 2018-07-20 ENCOUNTER — Ambulatory Visit: Payer: Medicare Other | Admitting: Psychology

## 2018-08-06 ENCOUNTER — Ambulatory Visit: Payer: Medicare Other | Admitting: Psychology

## 2018-09-15 ENCOUNTER — Ambulatory Visit: Payer: Medicare Other | Admitting: Family Medicine

## 2018-09-15 ENCOUNTER — Encounter: Payer: Self-pay | Admitting: Family Medicine

## 2018-09-15 ENCOUNTER — Telehealth: Payer: Self-pay | Admitting: Family Medicine

## 2018-09-15 VITALS — BP 124/80 | HR 79 | Temp 97.9°F | Ht 67.0 in | Wt 216.2 lb

## 2018-09-15 DIAGNOSIS — J101 Influenza due to other identified influenza virus with other respiratory manifestations: Secondary | ICD-10-CM | POA: Diagnosis not present

## 2018-09-15 HISTORY — DX: Influenza due to other identified influenza virus with other respiratory manifestations: J10.1

## 2018-09-15 MED ORDER — PREDNISONE 20 MG PO TABS: ORAL_TABLET | ORAL | 0 refills | Status: DC

## 2018-09-15 MED ORDER — HYDROCOD POLST-CPM POLST ER 10-8 MG/5ML PO SUER: 5.0000 mL | Freq: Two times a day (BID) | ORAL | 0 refills | Status: DC | PRN

## 2018-09-15 NOTE — Progress Notes (Signed)
BP 124/80 (BP Location: Left Arm, Patient Position: Sitting, Cuff Size: Large)   Pulse 79   Temp 97.9 F (36.6 C) (Oral)   Ht 5\' 7"  (1.702 m)   Wt 216 lb 4 oz (98.1 kg)   LMP 06/01/2013   SpO2 98%   BMI 33.87 kg/m    CC: cough Subjective:    Patient ID: Jamie Patterson, female    DOB: [birth year], 58 y.o.   MRN: [id number]  HPI: Jamie Patterson is a 58 y.o. female presenting on 09/15/2018 for Cough (C/o productive cough,chest tightness and hoarseness. Also, c/o sharp left ear pain. Started 09/13/18. Seen at [facility] on 09/12/18, dx flu A. Treated with Tamiflu. Pt accompanied by her husband, Aurther Loft Low.)   Seen at [facility] over weekend with + influenza A taking tamiflu. Flu symptoms largely resolved but headache persists. 3d h/o productive cough of brown mucous, hoarseness, L ear pain, chest tightness. Coughing fits with dyspnea and wheezing. Ongoing fatigue. HA described as throbbing pain. Chills have resolved, never fever.   No h/o asthma.  Grandchildren sick recently. Ex smoker, quit 1997. 20 PY hx. She's been alternating ibuprofen and tylenol without much benefit.  She did receive flu shot 1 wk prior to symptoms developing.   I last saw patient 05/2017.  Patient is frustrated today at how bad she is feeling.      Relevant past medical, surgical, family and social history reviewed and updated as indicated. Interim medical history since our last visit reviewed. Allergies and medications reviewed and updated. Outpatient Medications Prior to Visit  Medication Sig Dispense Refill  . acetaminophen (TYLENOL) 500 MG tablet Take 500 mg by mouth as needed.     Marland Kitchen albuterol (PROVENTIL HFA;VENTOLIN HFA) 108 (90 Base) MCG/ACT inhaler Inhale 2 puffs into the lungs every 6 (six) hours as needed for wheezing or shortness of breath. 1 Inhaler 2  . Ascorbic Acid (VITAMIN C) 1000 MG tablet Take 1,000 mg by mouth daily.    Marland Kitchen aspirin EC 81 MG tablet Take 81 mg by mouth every evening.      . cefdinir (OMNICEF) 300 MG  capsule Take 1 capsule (300 mg total) by mouth 2 (two) times daily. 20 capsule 0  . DULoxetine (CYMBALTA) 60 MG capsule Take 60 mg by mouth 2 (two) times daily.     Marland Kitchen escitalopram (LEXAPRO) 10 MG tablet Take 10 mg by mouth daily.     . ferrous sulfate 325 (65 FE) MG tablet Take 325 mg by mouth daily with breakfast.    . lamoTRIgine (LAMICTAL) 150 MG tablet TAKE 1 TABLET BY MOUTH TWICE A DAY 180 tablet 1  . MAGNESIUM PO Take by mouth daily.    . Melatonin 3 MG TABS Take 10 mg by mouth at bedtime.     . Multiple Vitamin (MULTIVITAMIN) tablet Take 1 tablet by mouth daily.    . naproxen sodium (ANAPROX) 220 MG tablet Take 440 mg by mouth as needed.     . primidone (MYSOLINE) 50 MG tablet Take 1 tablet (50 mg total) by mouth at bedtime. 90 tablet 2  . propranolol (INDERAL) 40 MG tablet Take 1 tablet (40 mg total) by mouth daily. 90 tablet 2  . tiZANidine (ZANAFLEX) 2 MG tablet Take 2 mg by mouth at bedtime.    . vitamin B-12 (CYANOCOBALAMIN) 1000 MCG tablet Take 500 mcg by mouth daily.     Marland Kitchen gabapentin (NEURONTIN) 300 MG capsule Take 600 mg by mouth 2 (two) times daily.  Facility-Administered Medications Prior to Visit  Medication Dose Route Frequency Provider Last Rate Last Dose  . methylPREDNISolone acetate (DEPO-MEDROL) injection 80 mg  80 mg Intramuscular Once Baity, Regina W, NP         Per HPI unless specifically indicated in ROS section below Review of Systems Objective:    BP 124/80 (BP Location: Left Arm, Patient Position: Sitting, Cuff Size: Large)   Pulse 79   Temp 97.9 F (36.6 C) (Oral)   Ht 5\' 7"  (1.702 m)   Wt 216 lb 4 oz (98.1 kg)   LMP 06/01/2013   SpO2 98%   BMI 33.87 kg/m   Wt Readings from Last 3 Encounters:  09/15/18 216 lb 4 oz (98.1 kg)  04/01/18 209 lb (94.8 kg)  02/16/18 209 lb (94.8 kg)    Physical Exam Vitals signs and nursing note reviewed.  Constitutional:      General: She is not in acute distress.    Appearance: Normal appearance. She is  well-developed.     Comments: Tired appearing Coughing fits  HENT:     Head: Normocephalic and atraumatic.     Right Ear: Hearing, tympanic membrane, ear canal and external ear normal.     Left Ear: Hearing, tympanic membrane, ear canal and external ear normal.     Nose: Mucosal edema, congestion and rhinorrhea present.     Right Sinus: No maxillary sinus tenderness or frontal sinus tenderness.     Left Sinus: No maxillary sinus tenderness or frontal sinus tenderness.     Mouth/Throat:     Pharynx: Oropharynx is clear. Uvula midline. No oropharyngeal exudate or posterior oropharyngeal erythema.     Tonsils: No tonsillar abscesses.  Eyes:     General: No scleral icterus.    Conjunctiva/sclera: Conjunctivae normal.     Pupils: Pupils are equal, round, and reactive to light.  Neck:     Musculoskeletal: Normal range of motion and neck supple.  Cardiovascular:     Rate and Rhythm: Normal rate and regular rhythm.     Pulses: Normal pulses.     Heart sounds: Normal heart sounds. No murmur.  Pulmonary:     Effort: Pulmonary effort is normal. No respiratory distress.     Breath sounds: Transmitted upper airway sounds present. Decreased breath sounds and wheezing (coarse expiratory wheezing throughout) present. No rhonchi or rales.  Lymphadenopathy:     Cervical: No cervical adenopathy.  Skin:    General: Skin is warm and dry.     Findings: No rash.  Neurological:     Mental Status: She is alert.        Assessment & Plan:  I asked her to return when feeling better as overdue for physical/wellness visit. She became upset with me stating she was tired of our office contacting her to try and schedule wellness visits, stating she sees OBGYN for well woman exam. Advised we review other preventative healthcare measures not covered by gynecologist and I did recommend she take advantage of these services. She states she does not want to receive further reminders to schedule overdue preventative  healthcare.  Problem List Items Addressed This Visit    Influenza A - Primary    Recent dx at [facility], some symptoms improving but persistent headache, productive cough, now with worsening wheezing and chest soreness from cough anticipate RAD exacerbation (no h/o asthma). No evidence of bacterial superinfection so don't recommend antibiotic at this time. Will treat with prednisone course, tussionex cough syrup, and albuterol inhaler. Red  flags to seek further care reviewed.  She was not satisfied with treatment plan today.          Meds ordered this encounter  Medications  . predniSONE (DELTASONE) 20 MG tablet    Sig: Take two tablets daily for 3 days followed by one tablet daily for 4 days    Dispense:  10 tablet    Refill:  0  . chlorpheniramine-HYDROcodone (TUSSIONEX PENNKINETIC ER) 10-8 MG/5ML SUER    Sig: Take 5 mLs by mouth every 12 (twelve) hours as needed for cough (sedation precautions).    Dispense:  120 mL    Refill:  0   No orders of the defined types were placed in this encounter.   Follow up plan: No follow-ups on file.  [name], MD

## 2018-09-15 NOTE — Assessment & Plan Note (Addendum)
Recent dx at [facility], some symptoms improving but persistent headache, productive cough, now with worsening wheezing and chest soreness from cough anticipate RAD exacerbation (no h/o asthma). No evidence of bacterial superinfection so don't recommend antibiotic at this time. Will treat with prednisone course, tussionex cough syrup, and albuterol inhaler. Red flags to seek further care reviewed.  She was not satisfied with treatment plan today.

## 2018-09-15 NOTE — Patient Instructions (Addendum)
Treat ongoing cough and flu with tussionex cough syrup twice daily with sedation precautions, prednisone taper, and try albuterol inhaler for chest tightness (2 puffs every 6 hours as needed for cough, wheezing, shortness of breath).  Let us know if fever, or not improving with above.

## 2018-09-15 NOTE — Telephone Encounter (Signed)
Fyi. Pt states she does not want to receive any more reminder phone calls from our office to schedule wellness exam.

## 2018-09-21 ENCOUNTER — Encounter: Payer: Self-pay | Admitting: Nurse Practitioner

## 2018-09-21 ENCOUNTER — Ambulatory Visit: Payer: Medicare Other | Admitting: Nurse Practitioner

## 2018-09-21 ENCOUNTER — Ambulatory Visit (INDEPENDENT_AMBULATORY_CARE_PROVIDER_SITE_OTHER): Payer: Medicare Other

## 2018-09-21 VITALS — BP 146/100 | HR 94 | Temp 99.8°F | Ht 67.0 in | Wt 217.8 lb

## 2018-09-21 DIAGNOSIS — J181 Lobar pneumonia, unspecified organism: Secondary | ICD-10-CM

## 2018-09-21 DIAGNOSIS — R0602 Shortness of breath: Secondary | ICD-10-CM | POA: Diagnosis not present

## 2018-09-21 DIAGNOSIS — R5381 Other malaise: Secondary | ICD-10-CM

## 2018-09-21 DIAGNOSIS — J209 Acute bronchitis, unspecified: Secondary | ICD-10-CM

## 2018-09-21 DIAGNOSIS — J9801 Acute bronchospasm: Secondary | ICD-10-CM

## 2018-09-21 DIAGNOSIS — R5383 Other fatigue: Secondary | ICD-10-CM

## 2018-09-21 DIAGNOSIS — J189 Pneumonia, unspecified organism: Secondary | ICD-10-CM

## 2018-09-21 MED ORDER — METHYLPREDNISOLONE ACETATE 40 MG/ML IJ SUSP
40.0000 mg | Freq: Once | INTRAMUSCULAR | Status: AC
  Administered 2018-09-21: 40 mg via INTRAMUSCULAR

## 2018-09-21 MED ORDER — IPRATROPIUM-ALBUTEROL 0.5-2.5 (3) MG/3ML IN SOLN
3.0000 mL | Freq: Four times a day (QID) | RESPIRATORY_TRACT | Status: DC
  Administered 2018-09-21: 3 mL via RESPIRATORY_TRACT

## 2018-09-21 MED ORDER — BENZONATATE 100 MG PO CAPS: 100.0000 mg | ORAL_CAPSULE | Freq: Three times a day (TID) | ORAL | 0 refills | Status: DC | PRN

## 2018-09-21 MED ORDER — BUDESONIDE-FORMOTEROL FUMARATE 160-4.5 MCG/ACT IN AERO: 2.0000 | INHALATION_SPRAY | Freq: Two times a day (BID) | RESPIRATORY_TRACT | 0 refills | Status: DC

## 2018-09-21 MED ORDER — LEVOFLOXACIN 500 MG PO TABS: 500.0000 mg | ORAL_TABLET | Freq: Every day | ORAL | 0 refills | Status: DC

## 2018-09-21 NOTE — Patient Instructions (Addendum)
CXR indicates LLL pneumonia. levaquin sent. Also sent symbicort inhaler to use BID (rinse mouth after each use). Use albuterol 1puff TID x 2days, then as needed.  Return to office for repeat CXR in 1week.

## 2018-09-21 NOTE — Progress Notes (Signed)
Subjective:  Patient ID: Jamie Patterson, female    DOB: [birth year]  Age: 58 y.o. MRN: [id number]  CC: Cough (headache,couging green mucus,bodyache--hurt when cough,sore throat/ going on since 09/04/2018/ FYI--father just pass away on 09/19/2018. )  Cough  This is a new problem. The current episode started 1 to 4 weeks ago. The problem has been gradually worsening. The problem occurs constantly. The cough is productive of sputum. Associated symptoms include chest pain, chills, a fever, myalgias, nasal congestion, postnasal drip, shortness of breath, sweats and wheezing. Pertinent negatives include no rhinorrhea. The symptoms are aggravated by lying down and cold air. She has tried prescription cough suppressant, oral steroids and a beta-agonist inhaler for the symptoms. The treatment provided no relief. Her past medical history is significant for bronchitis.   Reviewed past Medical, Social and Family history today.  Outpatient Medications Prior to Visit  Medication Sig Dispense Refill  . acetaminophen (TYLENOL) 500 MG tablet Take 500 mg by mouth as needed.     Marland Kitchen albuterol (PROVENTIL HFA;VENTOLIN HFA) 108 (90 Base) MCG/ACT inhaler Inhale 2 puffs into the lungs every 6 (six) hours as needed for wheezing or shortness of breath. 1 Inhaler 2  . Ascorbic Acid (VITAMIN C) 1000 MG tablet Take 1,000 mg by mouth daily.    Marland Kitchen aspirin EC 81 MG tablet Take 81 mg by mouth every evening.      . chlorpheniramine-HYDROcodone (TUSSIONEX PENNKINETIC ER) 10-8 MG/5ML SUER Take 5 mLs by mouth every 12 (twelve) hours as needed for cough (sedation precautions). 120 mL 0  . DULoxetine (CYMBALTA) 60 MG capsule Take 60 mg by mouth 2 (two) times daily.     Marland Kitchen escitalopram (LEXAPRO) 10 MG tablet Take 10 mg by mouth daily.     . ferrous sulfate 325 (65 FE) MG tablet Take 325 mg by mouth daily with breakfast.    . lamoTRIgine (LAMICTAL) 150 MG tablet TAKE 1 TABLET BY MOUTH TWICE A DAY 180 tablet 1  . MAGNESIUM PO Take by mouth  daily.    . Melatonin 3 MG TABS Take 10 mg by mouth at bedtime.     . Multiple Vitamin (MULTIVITAMIN) tablet Take 1 tablet by mouth daily.    . naproxen sodium (ANAPROX) 220 MG tablet Take 440 mg by mouth as needed.     . primidone (MYSOLINE) 50 MG tablet Take 1 tablet (50 mg total) by mouth at bedtime. 90 tablet 2  . propranolol (INDERAL) 40 MG tablet Take 1 tablet (40 mg total) by mouth daily. 90 tablet 2  . tiZANidine (ZANAFLEX) 2 MG tablet Take 2 mg by mouth at bedtime.    . vitamin B-12 (CYANOCOBALAMIN) 1000 MCG tablet Take 500 mcg by mouth daily.     . cefdinir (OMNICEF) 300 MG capsule Take 1 capsule (300 mg total) by mouth 2 (two) times daily. 20 capsule 0  . predniSONE (DELTASONE) 20 MG tablet Take two tablets daily for 3 days followed by one tablet daily for 4 days (Patient not taking: Reported on 09/21/2018) 10 tablet 0   Facility-Administered Medications Prior to Visit  Medication Dose Route Frequency Provider Last Rate Last Dose  . methylPREDNISolone acetate (DEPO-MEDROL) injection 80 mg  80 mg Intramuscular Once Baity, Regina W, NP        ROS See HPI  Objective:  BP (!) 146/100   Pulse 94   Temp 99.8 F (37.7 C) (Oral)   Ht 5\' 7"  (1.702 m)   Wt 217 lb 12.8 oz (98.8  kg)   LMP 06/01/2013   SpO2 96%   BMI 34.11 kg/m   BP Readings from Last 3 Encounters:  09/21/18 (!) 146/100  09/15/18 124/80  04/01/18 126/82    Wt Readings from Last 3 Encounters:  09/21/18 217 lb 12.8 oz (98.8 kg)  09/15/18 216 lb 4 oz (98.1 kg)  04/01/18 209 lb (94.8 kg)    Physical Exam Vitals signs reviewed.  HENT:     Nose: Congestion present. No rhinorrhea.     Mouth/Throat:     Mouth: Mucous membranes are moist.  Cardiovascular:     Rate and Rhythm: Normal rate and regular rhythm.  Pulmonary:     Breath sounds: Wheezing and rales present.     Comments: Persistent wheezing post nebulizer treatment. Musculoskeletal:     Right lower leg: No edema.     Left lower leg: No edema.    Neurological:     Mental Status: She is alert and oriented to person, place, and time.  Psychiatric:        Behavior: Behavior normal.        Thought Content: Thought content normal.    Lab Results  Component Value Date   WBC 5.3 02/16/2018   HGB 15.0 02/16/2018   HCT 43.8 02/16/2018   PLT 271 02/16/2018   GLUCOSE 92 02/16/2018   CHOL 213 (H) 05/04/2015   TRIG 73.0 05/04/2015   HDL 55.70 05/04/2015   LDLDIRECT 135.7 08/09/2009   LDLCALC 142 (H) 05/04/2015   ALT 17 02/16/2018   AST 22 02/16/2018   NA 139 02/16/2018   K 5.5 (H) 02/16/2018   CL 102 02/16/2018   CREATININE 0.67 02/16/2018   BUN 12 02/16/2018   CO2 29 02/16/2018   TSH 3.34 02/16/2018   INR 1.02 08/20/2011   HGBA1C 4.9 01/06/2012    Dg Chest 2 View  Result Date: 06/12/2017 CLINICAL DATA:  58 year old female with chronic, nonproductive cough for 5 months. EXAM: CHEST  2 VIEW COMPARISON:  10/28/2016 FINDINGS: The heart size and mediastinal contours are within normal limits. Both lungs are clear. The visualized skeletal structures are unremarkable. IMPRESSION: No active cardiopulmonary disease. Electronically Signed   By: Sande BrothersSerena  Chacko M.D.   On: 06/12/2017 14:49    Assessment & Plan:   Lavonn was seen today for cough.  Diagnoses and all orders for this visit:  Community acquired pneumonia of left lower lobe of lung (HCC) -     DG Chest 2 View -     ipratropium-albuterol (DUONEB) 0.5-2.5 (3) MG/3ML nebulizer solution 3 mL -     benzonatate (TESSALON) 100 MG capsule; Take 1 capsule (100 mg total) by mouth 3 (three) times daily as needed for cough. -     methylPREDNISolone acetate (DEPO-MEDROL) injection 40 mg -     levofloxacin (LEVAQUIN) 500 MG tablet; Take 1 tablet (500 mg total) by mouth daily. -     DG Chest 2 View; Future  Acute bronchitis, unspecified organism -     DG Chest 2 View -     ipratropium-albuterol (DUONEB) 0.5-2.5 (3) MG/3ML nebulizer solution 3 mL -     budesonide-formoterol (SYMBICORT)  160-4.5 MCG/ACT inhaler; Inhale 2 puffs into the lungs 2 (two) times daily. Rinse mouth after use -     DG Chest 2 View; Future  Malaise and fatigue -     DG Chest 2 View -     levofloxacin (LEVAQUIN) 500 MG tablet; Take 1 tablet (500 mg total) by mouth daily. -  DG Chest 2 View; Future   I have discontinued Naylin C. Neville's cefdinir and predniSONE. I am also having her start on benzonatate, levofloxacin, and budesonide-formoterol. Additionally, I am having her maintain her vitamin B-12, naproxen sodium, acetaminophen, aspirin EC, Melatonin, DULoxetine, multivitamin, escitalopram, MAGNESIUM PO, vitamin C, ferrous sulfate, lamoTRIgine, albuterol, tiZANidine, primidone, propranolol, and chlorpheniramine-HYDROcodone. We will stop administering methylPREDNISolone acetate. Additionally, we administered ipratropium-albuterol and methylPREDNISolone acetate. Additionally, we will continue to administer ipratropium-albuterol.  Meds ordered this encounter  Medications  . ipratropium-albuterol (DUONEB) 0.5-2.5 (3) MG/3ML nebulizer solution 3 mL  . benzonatate (TESSALON) 100 MG capsule    Sig: Take 1 capsule (100 mg total) by mouth 3 (three) times daily as needed for cough.    Dispense:  20 capsule    Refill:  0    Order Specific Question:   Supervising Provider    Answer:   Dianne Dun [[id number]]  . methylPREDNISolone acetate (DEPO-MEDROL) injection 40 mg  . levofloxacin (LEVAQUIN) 500 MG tablet    Sig: Take 1 tablet (500 mg total) by mouth daily.    Dispense:  7 tablet    Refill:  0    Order Specific Question:   Supervising Provider    Answer:   Dianne Dun [[id number]]  . budesonide-formoterol (SYMBICORT) 160-4.5 MCG/ACT inhaler    Sig: Inhale 2 puffs into the lungs 2 (two) times daily. Rinse mouth after use    Dispense:  1 Inhaler    Refill:  0    Order Specific Question:   Supervising Provider    Answer:   Dianne Dun [[id number]]    Problem List Items Addressed This Visit    None    Visit  Diagnoses    Community acquired pneumonia of left lower lobe of lung (HCC)    -  Primary   Relevant Medications   ipratropium-albuterol (DUONEB) 0.5-2.5 (3) MG/3ML nebulizer solution 3 mL   benzonatate (TESSALON) 100 MG capsule   methylPREDNISolone acetate (DEPO-MEDROL) injection 40 mg (Completed)   levofloxacin (LEVAQUIN) 500 MG tablet   budesonide-formoterol (SYMBICORT) 160-4.5 MCG/ACT inhaler   Other Relevant Orders   DG Chest 2 View (Completed)   DG Chest 2 View   Acute bronchitis, unspecified organism       Relevant Medications   ipratropium-albuterol (DUONEB) 0.5-2.5 (3) MG/3ML nebulizer solution 3 mL   budesonide-formoterol (SYMBICORT) 160-4.5 MCG/ACT inhaler   Other Relevant Orders   DG Chest 2 View (Completed)   DG Chest 2 View   Malaise and fatigue       Relevant Medications   levofloxacin (LEVAQUIN) 500 MG tablet   Other Relevant Orders   DG Chest 2 View (Completed)   DG Chest 2 View       Follow-up: No follow-ups on file.  Alysia Penna, NP

## 2018-09-29 ENCOUNTER — Ambulatory Visit: Payer: Medicare Other | Admitting: Nurse Practitioner

## 2018-09-29 ENCOUNTER — Encounter: Payer: Self-pay | Admitting: Nurse Practitioner

## 2018-09-29 ENCOUNTER — Ambulatory Visit (INDEPENDENT_AMBULATORY_CARE_PROVIDER_SITE_OTHER): Payer: Medicare Other

## 2018-09-29 VITALS — BP 140/112 | HR 134 | Temp 97.5°F | Ht 67.0 in | Wt 212.8 lb

## 2018-09-29 DIAGNOSIS — R05 Cough: Secondary | ICD-10-CM

## 2018-09-29 DIAGNOSIS — R000 Tachycardia, unspecified: Secondary | ICD-10-CM

## 2018-09-29 DIAGNOSIS — J181 Lobar pneumonia, unspecified organism: Secondary | ICD-10-CM | POA: Diagnosis not present

## 2018-09-29 DIAGNOSIS — J209 Acute bronchitis, unspecified: Secondary | ICD-10-CM

## 2018-09-29 DIAGNOSIS — R5381 Other malaise: Secondary | ICD-10-CM

## 2018-09-29 DIAGNOSIS — R5383 Other fatigue: Secondary | ICD-10-CM | POA: Diagnosis not present

## 2018-09-29 DIAGNOSIS — J189 Pneumonia, unspecified organism: Secondary | ICD-10-CM

## 2018-09-29 DIAGNOSIS — R053 Chronic cough: Secondary | ICD-10-CM

## 2018-09-29 MED ORDER — LEVALBUTEROL TARTRATE 45 MCG/ACT IN AERO: 1.0000 | INHALATION_SPRAY | Freq: Four times a day (QID) | RESPIRATORY_TRACT | 1 refills | Status: DC | PRN

## 2018-09-29 MED ORDER — GUAIFENESIN ER 600 MG PO TB12: 600.0000 mg | ORAL_TABLET | Freq: Two times a day (BID) | ORAL | 0 refills | Status: DC | PRN

## 2018-09-29 MED ORDER — MONTELUKAST SODIUM 10 MG PO TABS: 10.0000 mg | ORAL_TABLET | Freq: Every day | ORAL | 3 refills | Status: DC

## 2018-09-29 MED ORDER — BENZONATATE 100 MG PO CAPS: 100.0000 mg | ORAL_CAPSULE | Freq: Three times a day (TID) | ORAL | 0 refills | Status: DC | PRN

## 2018-09-29 NOTE — Progress Notes (Signed)
Subjective:  Patient ID: Jamie Patterson, female    DOB: 1961-02-07  Age: 58 y.o. MRN: [id number]  CC: Cough (pt is c/o of coughing,no voice,hurt when coughing,SOB,chills/ not better from lats ov. )  HPI Accompanied by Jamie Patterson husband  Jamie Patterson presents with persistent cough, chills and SOB. She has completed Levaquin as prescribed. She continued use of symbicort, albuterol and benzonatate as prescribed with minimal relief. She is able to maintain adequate oral hydration and food intake. Albuterol inhaler last used 4hrs ago.  Reviewed past Medical, Social and Family history today.  Outpatient Medications Prior to Visit  Medication Sig Dispense Refill  . acetaminophen (TYLENOL) 500 MG tablet Take 500 mg by mouth as needed.     . Ascorbic Acid (VITAMIN C) 1000 MG tablet Take 1,000 mg by mouth daily.    Marland Kitchen aspirin EC 81 MG tablet Take 81 mg by mouth every evening.      . budesonide-formoterol (SYMBICORT) 160-4.5 MCG/ACT inhaler Inhale 2 puffs into the lungs 2 (two) times daily. Rinse mouth after use 1 Inhaler 0  . DULoxetine (CYMBALTA) 60 MG capsule Take 60 mg by mouth 2 (two) times daily.     Marland Kitchen escitalopram (LEXAPRO) 10 MG tablet Take 10 mg by mouth daily.     . ferrous sulfate 325 (65 FE) MG tablet Take 325 mg by mouth daily with breakfast.    . lamoTRIgine (LAMICTAL) 150 MG tablet TAKE 1 TABLET BY MOUTH TWICE A DAY 180 tablet 1  . MAGNESIUM PO Take by mouth daily.    . Melatonin 3 MG TABS Take 10 mg by mouth at bedtime.     . Multiple Vitamin (MULTIVITAMIN) tablet Take 1 tablet by mouth daily.    . naproxen sodium (ANAPROX) 220 MG tablet Take 440 mg by mouth as needed.     . primidone (MYSOLINE) 50 MG tablet Take 1 tablet (50 mg total) by mouth at bedtime. 90 tablet 2  . propranolol (INDERAL) 40 MG tablet Take 1 tablet (40 mg total) by mouth daily. 90 tablet 2  . tiZANidine (ZANAFLEX) 2 MG tablet Take 2 mg by mouth at bedtime.    . vitamin B-12 (CYANOCOBALAMIN) 1000 MCG tablet Take 500 mcg by  mouth daily.     Marland Kitchen albuterol (PROVENTIL HFA;VENTOLIN HFA) 108 (90 Base) MCG/ACT inhaler Inhale 2 puffs into the lungs every 6 (six) hours as needed for wheezing or shortness of breath. 1 Inhaler 2  . benzonatate (TESSALON) 100 MG capsule Take 1 capsule (100 mg total) by mouth 3 (three) times daily as needed for cough. 20 capsule 0  . chlorpheniramine-HYDROcodone (TUSSIONEX PENNKINETIC ER) 10-8 MG/5ML SUER Take 5 mLs by mouth every 12 (twelve) hours as needed for cough (sedation precautions). 120 mL 0  . levofloxacin (LEVAQUIN) 500 MG tablet Take 1 tablet (500 mg total) by mouth daily. (Patient not taking: Reported on 09/29/2018) 7 tablet 0   Facility-Administered Medications Prior to Visit  Medication Dose Route Frequency Provider Last Rate Last Dose  . ipratropium-albuterol (DUONEB) 0.5-2.5 (3) MG/3ML nebulizer solution 3 mL  3 mL Nebulization Q6H Turhan Chill, Bonna Gains, NP   3 mL at 09/21/18 1547    ROS See HPI  Objective:  BP (!) 140/112   Pulse (!) 134   Temp (!) 97.5 F (36.4 C) (Oral)   Ht 5\' 7"  (1.702 m)   Wt 212 lb 12.8 oz (96.5 kg)   LMP 06/01/2013   SpO2 96%   BMI 33.33 kg/m   BP  Readings from Last 3 Encounters:  09/29/18 (!) 140/112  09/21/18 (!) 146/100  09/15/18 124/80    Wt Readings from Last 3 Encounters:  09/29/18 212 lb 12.8 oz (96.5 kg)  09/21/18 217 lb 12.8 oz (98.8 kg)  09/15/18 216 lb 4 oz (98.1 kg)    Physical Exam Vitals signs reviewed.  Constitutional:      Appearance: She is ill-appearing.  Cardiovascular:     Rate and Rhythm: Regular rhythm. Tachycardia present.     Heart sounds: Normal heart sounds.  Pulmonary:     Effort: Pulmonary effort is normal.     Breath sounds: No stridor. Wheezing present. No rhonchi or rales.  Musculoskeletal:     Right lower leg: No edema.     Left lower leg: No edema.  Neurological:     Mental Status: She is alert and oriented to person, place, and time.    Lab Results  Component Value Date   WBC 8.9  09/30/2018   HGB 14.7 09/30/2018   HCT 42.8 09/30/2018   PLT 528.0 (H) 09/30/2018   GLUCOSE 90 09/30/2018   CHOL 213 (H) 05/04/2015   TRIG 73.0 05/04/2015   HDL 55.70 05/04/2015   LDLDIRECT 135.7 08/09/2009   LDLCALC 142 (H) 05/04/2015   ALT 17 02/16/2018   AST 22 02/16/2018   NA 137 09/30/2018   K 5.0 09/30/2018   CL 99 09/30/2018   CREATININE 0.71 09/30/2018   BUN 11 09/30/2018   CO2 32 09/30/2018   TSH 3.34 02/16/2018   INR 1.02 08/20/2011   HGBA1C 4.9 01/06/2012    Dg Chest 2 View  Result Date: 06/12/2017 CLINICAL DATA:  57 year old female with chronic, nonproductive cough for 5 months. EXAM: CHEST  2 VIEW COMPARISON:  10/28/2016 FINDINGS: The heart size and mediastinal contours are within normal limits. Both lungs are clear. The visualized skeletal structures are unremarkable. IMPRESSION: No active cardiopulmonary disease. Electronically Signed   By: Sande Brothers M.D.   On: 06/12/2017 14:49   ECG: S. Tachycardia.  Assessment & Plan:   Jamie Patterson was seen today for cough.  Diagnoses and all orders for this visit:  Community acquired pneumonia of left lower lobe of lung (HCC) -     levalbuterol (XOPENEX HFA) 45 MCG/ACT inhaler; Inhale 1 puff into the lungs every 6 (six) hours as needed for wheezing or shortness of breath. -     benzonatate (TESSALON) 100 MG capsule; Take 1 capsule (100 mg total) by mouth 3 (three) times daily as needed for cough. -     DG Chest 2 View -     doxycycline (VIBRA-TABS) 100 MG tablet; Take 1 tablet (100 mg total) by mouth 2 (two) times daily for 7 days. -     CBC w/Diff; Future -     Basic metabolic panel; Future  Tachycardia -     EKG 12-Lead -     doxycycline (VIBRA-TABS) 100 MG tablet; Take 1 tablet (100 mg total) by mouth 2 (two) times daily for 7 days. -     CBC w/Diff; Future -     Basic metabolic panel; Future  Chronic cough -     Ambulatory referral to Pulmonology -     guaiFENesin (MUCINEX) 600 MG 12 hr tablet; Take 1 tablet (600  mg total) by mouth 2 (two) times daily as needed for cough or to loosen phlegm. -     montelukast (SINGULAIR) 10 MG tablet; Take 1 tablet (10 mg total) by mouth at bedtime.  Malaise and fatigue -     DG Chest 2 View  Acute bronchitis, unspecified organism -     DG Chest 2 View   I have discontinued Rayanne C. Mruk's albuterol, chlorpheniramine-HYDROcodone, and levofloxacin. I am also having Jamie Patterson start on levalbuterol, guaiFENesin, montelukast, and doxycycline. Additionally, I am having Jamie Patterson maintain Jamie Patterson vitamin B-12, naproxen sodium, acetaminophen, aspirin EC, Melatonin, DULoxetine, multivitamin, escitalopram, MAGNESIUM PO, vitamin C, ferrous sulfate, lamoTRIgine, tiZANidine, primidone, propranolol, budesonide-formoterol, and benzonatate. We will continue to administer ipratropium-albuterol.  Meds ordered this encounter  Medications  . levalbuterol (XOPENEX HFA) 45 MCG/ACT inhaler    Sig: Inhale 1 puff into the lungs every 6 (six) hours as needed for wheezing or shortness of breath.    Dispense:  1 Inhaler    Refill:  1    Order Specific Question:   Supervising Provider    Answer:   Dianne Dun [[id number]]  . guaiFENesin (MUCINEX) 600 MG 12 hr tablet    Sig: Take 1 tablet (600 mg total) by mouth 2 (two) times daily as needed for cough or to loosen phlegm.    Dispense:  14 tablet    Refill:  0    Order Specific Question:   Supervising Provider    Answer:   Dianne Dun [[id number]]  . montelukast (SINGULAIR) 10 MG tablet    Sig: Take 1 tablet (10 mg total) by mouth at bedtime.    Dispense:  30 tablet    Refill:  3    Order Specific Question:   Supervising Provider    Answer:   Dianne Dun [[id number]]  . benzonatate (TESSALON) 100 MG capsule    Sig: Take 1 capsule (100 mg total) by mouth 3 (three) times daily as needed for cough.    Dispense:  30 capsule    Refill:  0    Order Specific Question:   Supervising Provider    Answer:   Dianne Dun [[id number]]  . doxycycline (VIBRA-TABS) 100 MG tablet     Sig: Take 1 tablet (100 mg total) by mouth 2 (two) times daily for 7 days.    Dispense:  14 tablet    Refill:  0    Order Specific Question:   Supervising Provider    Answer:   MATTHEWS, CODY [[id number]]    Problem List Items Addressed This Visit      Other   Chronic cough   Relevant Medications   guaiFENesin (MUCINEX) 600 MG 12 hr tablet   montelukast (SINGULAIR) 10 MG tablet   Other Relevant Orders   Ambulatory referral to Pulmonology    Other Visit Diagnoses    Community acquired pneumonia of left lower lobe of lung (HCC)    -  Primary   Relevant Medications   levalbuterol (XOPENEX HFA) 45 MCG/ACT inhaler   guaiFENesin (MUCINEX) 600 MG 12 hr tablet   montelukast (SINGULAIR) 10 MG tablet   benzonatate (TESSALON) 100 MG capsule   doxycycline (VIBRA-TABS) 100 MG tablet   Other Relevant Orders   CBC w/Diff (Completed)   Basic metabolic panel (Completed)   Tachycardia       Relevant Medications   doxycycline (VIBRA-TABS) 100 MG tablet   Other Relevant Orders   EKG 12-Lead (Completed)   CBC w/Diff (Completed)   Basic metabolic panel (Completed)   Malaise and fatigue       Acute bronchitis, unspecified organism           Follow-up: Return if symptoms worsen or fail  to improve.  Alysia Penna, NP

## 2018-09-29 NOTE — Patient Instructions (Addendum)
CXR indicates persistent LLL pneumonia. Advised to start doxycycline, new RX sent. She is also to go to lab at [facility] for CBC and BMP blood draw. [facility] was chosen due to close proximity to her home. She report heart of 88 today. She verbalized understanding and agreed.  Normal cbc and BMP. No indication of sepsis at this time. F/up with pcp next week.  Stop albuterol due to tachycardia. Use xopenex for SOB, cough and wheezing.  You will be contacted to schedule appt with pulmonology.

## 2018-09-30 ENCOUNTER — Ambulatory Visit (INDEPENDENT_AMBULATORY_CARE_PROVIDER_SITE_OTHER): Payer: Medicare Other | Admitting: Psychology

## 2018-09-30 ENCOUNTER — Telehealth: Payer: Self-pay | Admitting: Nurse Practitioner

## 2018-09-30 ENCOUNTER — Encounter: Payer: Self-pay | Admitting: Nurse Practitioner

## 2018-09-30 ENCOUNTER — Other Ambulatory Visit (INDEPENDENT_AMBULATORY_CARE_PROVIDER_SITE_OTHER): Payer: Medicare Other

## 2018-09-30 DIAGNOSIS — J181 Lobar pneumonia, unspecified organism: Secondary | ICD-10-CM | POA: Diagnosis not present

## 2018-09-30 DIAGNOSIS — R000 Tachycardia, unspecified: Secondary | ICD-10-CM

## 2018-09-30 DIAGNOSIS — F331 Major depressive disorder, recurrent, moderate: Secondary | ICD-10-CM

## 2018-09-30 DIAGNOSIS — J189 Pneumonia, unspecified organism: Secondary | ICD-10-CM

## 2018-09-30 LAB — CBC WITH DIFFERENTIAL/PLATELET
Basophils Absolute: 0.1 10*3/uL (ref 0.0–0.1)
Basophils Relative: 0.6 % (ref 0.0–3.0)
Eosinophils Absolute: 0.1 10*3/uL (ref 0.0–0.7)
Eosinophils Relative: 1.3 % (ref 0.0–5.0)
HEMATOCRIT: 42.8 % (ref 36.0–46.0)
Hemoglobin: 14.7 g/dL (ref 12.0–15.0)
LYMPHS PCT: 24.8 % (ref 12.0–46.0)
Lymphs Abs: 2.2 10*3/uL (ref 0.7–4.0)
MCHC: 34.4 g/dL (ref 30.0–36.0)
MCV: 96.7 fl (ref 78.0–100.0)
Monocytes Absolute: 0.6 10*3/uL (ref 0.1–1.0)
Monocytes Relative: 6.5 % (ref 3.0–12.0)
Neutro Abs: 5.9 10*3/uL (ref 1.4–7.7)
Neutrophils Relative %: 66.8 % (ref 43.0–77.0)
Platelets: 528 10*3/uL — ABNORMAL HIGH (ref 150.0–400.0)
RBC: 4.43 Mil/uL (ref 3.87–5.11)
RDW: 12.1 % (ref 11.5–15.5)
WBC: 8.9 10*3/uL (ref 4.0–10.5)

## 2018-09-30 LAB — BASIC METABOLIC PANEL
BUN: 11 mg/dL (ref 6–23)
CO2: 32 meq/L (ref 19–32)
Calcium: 10.1 mg/dL (ref 8.4–10.5)
Chloride: 99 mEq/L (ref 96–112)
Creatinine, Ser: 0.71 mg/dL (ref 0.40–1.20)
GFR: 84.91 mL/min (ref >60.00)
Glucose, Bld: 90 mg/dL (ref 70–99)
Potassium: 5 mEq/L (ref 3.5–5.1)
Sodium: 137 mEq/L (ref 135–145)

## 2018-09-30 MED ORDER — DOXYCYCLINE HYCLATE 100 MG PO TABS: 100.0000 mg | ORAL_TABLET | Freq: Two times a day (BID) | ORAL | 0 refills | Status: DC

## 2018-09-30 NOTE — Telephone Encounter (Signed)
Copied from CRM [id number]. Topic: Quick Communication - See Telephone Encounter >> Sep 30, 2018  9:20 AM Terisa Starr wrote: CRM for notification. See Telephone encounter for: 09/30/18.  Pt is requesting her chest xray results from yesterday.

## 2018-09-30 NOTE — Telephone Encounter (Signed)
Charlotte please advise 

## 2018-09-30 NOTE — Telephone Encounter (Signed)
CXR discussed with patient. See result notes

## 2018-10-05 ENCOUNTER — Ambulatory Visit: Payer: Medicare Other | Admitting: Family Medicine

## 2018-10-05 ENCOUNTER — Encounter: Payer: Self-pay | Admitting: Family Medicine

## 2018-10-05 VITALS — BP 128/68 | HR 84 | Temp 97.6°F | Ht 67.0 in | Wt 212.0 lb

## 2018-10-05 DIAGNOSIS — J101 Influenza due to other identified influenza virus with other respiratory manifestations: Secondary | ICD-10-CM

## 2018-10-05 DIAGNOSIS — J189 Pneumonia, unspecified organism: Secondary | ICD-10-CM | POA: Insufficient documentation

## 2018-10-05 DIAGNOSIS — R490 Dysphonia: Secondary | ICD-10-CM

## 2018-10-05 DIAGNOSIS — J181 Lobar pneumonia, unspecified organism: Secondary | ICD-10-CM | POA: Diagnosis not present

## 2018-10-05 HISTORY — DX: Pneumonia, unspecified organism: J18.9

## 2018-10-05 MED ORDER — DOXYCYCLINE HYCLATE 100 MG PO TABS: 100.0000 mg | ORAL_TABLET | Freq: Two times a day (BID) | ORAL | 0 refills | Status: DC

## 2018-10-05 MED ORDER — HYDROCOD POLST-CPM POLST ER 10-8 MG/5ML PO SUER: 5.0000 mL | Freq: Every evening | ORAL | 0 refills | Status: DC | PRN

## 2018-10-05 NOTE — Patient Instructions (Signed)
Second doxcycycline course sent to pharmacy - fill if no improvement after finishing current course. Tussionex cough syrup refilled today.  Call in 2 weeks for repeat chest xray to ensure full resolution of pneumonia.

## 2018-10-05 NOTE — Assessment & Plan Note (Addendum)
Reviewed illness to date.  Very slow recovery from LLL PNA, no improvement after initial levaquin course, started improving on doxycycline. Will provide doxy refill to fill in case continued improvement not seen. Will refill tussionex for night time use only to help with rest.  Discussed rpt CXR, offered today however discussed don't anticipate much change in 5 days, rather recommended repeat CXR in 2 wks after full 4 wks of treatment for PNA. She states she will call to schedule rpt CXR.  She states she will seek a new provider after current illness.

## 2018-10-05 NOTE — Progress Notes (Signed)
BP 128/68 (BP Location: Left Arm, Patient Position: Sitting, Cuff Size: Normal)   Pulse 84   Temp 97.6 F (36.4 C) (Oral)   Ht 5\' 7"  (1.702 m)   Wt 212 lb (96.2 kg)   LMP 06/01/2013   SpO2 97%   BMI 33.20 kg/m    CC: f/u PNA Subjective:    Patient ID: Jamie Patterson, female    DOB: [birth year], 58 y.o.   MRN: [id number]  HPI: Jamie Patterson is a 58 y.o. female presenting on 10/05/2018 for Follow-up (Here for pneumonia f/u. Still has cough but has improved, worsenes later in day. Also, c/o hoarseness and fatigue. )   Here for recheck after recent PNA dx. See prior notes for details.  Difficult month - severe illness, father passed away 10/18/2018 and she was unable to be with him due to illness, unable to care for grandchildren due to illness.   Seen earlier this month after U[facility] dx influenza A treated with tamiflu. I added prednisone course, tussionex cough syrup, albuterol inhaler to regimen given ongoing dyspnea wheezing and cough without fever.   She worsened and was subsequently seen by Claris Gower at [facility] with productive cough, chills, fever, and wheezing. CXR at that time showed LLL PNA, treated with levaquin course and steroid injection and symbicort inhaler. She continued to worsen and was seen again last week with tachcyardia and persistent productive cough, treated with doxycycline, singluair, xopenex.   Did not fill xopenex due to cost.  Persistent productive cough with slow improvement.  No ST, but persistent hoarseness. Hoarse voice since 09/15/2018.  Fevers have resolved, however has persistent sweats.   Pt upset that she worsened after initially being seen and that chest xray was not initially obtained.       Relevant past medical, surgical, family and social history reviewed and updated as indicated. Interim medical history since our last visit reviewed. Allergies and medications reviewed and updated. Outpatient Medications Prior to Visit  Medication Sig Dispense Refill    . acetaminophen (TYLENOL) 500 MG tablet Take 500 mg by mouth as needed.     . Ascorbic Acid (VITAMIN C) 1000 MG tablet Take 1,000 mg by mouth daily.    Marland Kitchen aspirin EC 81 MG tablet Take 81 mg by mouth every evening.      . benzonatate (TESSALON) 100 MG capsule Take 1 capsule (100 mg total) by mouth 3 (three) times daily as needed for cough. 30 capsule 0  . budesonide-formoterol (SYMBICORT) 160-4.5 MCG/ACT inhaler Inhale 2 puffs into the lungs 2 (two) times daily. Rinse mouth after use 1 Inhaler 0  . DULoxetine (CYMBALTA) 60 MG capsule Take 60 mg by mouth 2 (two) times daily.     Marland Kitchen escitalopram (LEXAPRO) 10 MG tablet Take 10 mg by mouth daily.     . ferrous sulfate 325 (65 FE) MG tablet Take 325 mg by mouth daily with breakfast.    . lamoTRIgine (LAMICTAL) 150 MG tablet TAKE 1 TABLET BY MOUTH TWICE A DAY 180 tablet 1  . MAGNESIUM PO Take by mouth daily.    . Melatonin 3 MG TABS Take 10 mg by mouth at bedtime.     . montelukast (SINGULAIR) 10 MG tablet Take 1 tablet (10 mg total) by mouth at bedtime. 30 tablet 3  . Multiple Vitamin (MULTIVITAMIN) tablet Take 1 tablet by mouth daily.    . naproxen sodium (ANAPROX) 220 MG tablet Take 440 mg by mouth as needed.     . primidone (  MYSOLINE) 50 MG tablet Take 1 tablet (50 mg total) by mouth at bedtime. 90 tablet 2  . propranolol (INDERAL) 40 MG tablet Take 1 tablet (40 mg total) by mouth daily. 90 tablet 2  . tiZANidine (ZANAFLEX) 2 MG tablet Take 2 mg by mouth at bedtime.    . vitamin B-12 (CYANOCOBALAMIN) 1000 MCG tablet Take 500 mcg by mouth daily.     Marland Kitchen doxycycline (VIBRA-TABS) 100 MG tablet Take 1 tablet (100 mg total) by mouth 2 (two) times daily for 7 days. 14 tablet 0  . guaiFENesin (MUCINEX) 600 MG 12 hr tablet Take 1 tablet (600 mg total) by mouth 2 (two) times daily as needed for cough or to loosen phlegm. 14 tablet 0  . levalbuterol (XOPENEX HFA) 45 MCG/ACT inhaler Inhale 1 puff into the lungs every 6 (six) hours as needed for wheezing or  shortness of breath. 1 Inhaler 1   Facility-Administered Medications Prior to Visit  Medication Dose Route Frequency Provider Last Rate Last Dose  . ipratropium-albuterol (DUONEB) 0.5-2.5 (3) MG/3ML nebulizer solution 3 mL  3 mL Nebulization Q6H Nche, Bonna Gains, NP   3 mL at 09/21/18 1547     Per HPI unless specifically indicated in ROS section below Review of Systems Objective:    BP 128/68 (BP Location: Left Arm, Patient Position: Sitting, Cuff Size: Normal)   Pulse 84   Temp 97.6 F (36.4 C) (Oral)   Ht 5\' 7"  (1.702 m)   Wt 212 lb (96.2 kg)   LMP 06/01/2013   SpO2 97%   BMI 33.20 kg/m   Wt Readings from Last 3 Encounters:  10/05/18 212 lb (96.2 kg)  09/29/18 212 lb 12.8 oz (96.5 kg)  09/21/18 217 lb 12.8 oz (98.8 kg)    Physical Exam Vitals signs and nursing note reviewed.  Constitutional:      Appearance: Normal appearance. She is not ill-appearing.  HENT:     Mouth/Throat:     Mouth: Mucous membranes are moist.     Pharynx: No oropharyngeal exudate.  Cardiovascular:     Rate and Rhythm: Normal rate and regular rhythm.     Pulses: Normal pulses.     Heart sounds: Normal heart sounds. No murmur.  Pulmonary:     Effort: Pulmonary effort is normal. No respiratory distress.     Breath sounds: No wheezing, rhonchi or rales.     Comments: Faint crackles bilaterally Skin:    General: Skin is warm and dry.  Neurological:     Mental Status: She is alert.       Results for orders placed or performed in visit on 09/30/18  CBC w/Diff  Result Value Ref Range   WBC 8.9 4.0 - 10.5 K/uL   RBC 4.43 3.87 - 5.11 Mil/uL   Hemoglobin 14.7 12.0 - 15.0 g/dL   HCT 00.9 23.3 - 00.7 %   MCV 96.7 78.0 - 100.0 fl   MCHC 34.4 30.0 - 36.0 g/dL   RDW 62.2 63.3 - 35.4 %   Platelets 528.0 (H) 150.0 - 400.0 K/uL   Neutrophils Relative % 66.8 43.0 - 77.0 %   Lymphocytes Relative 24.8 12.0 - 46.0 %   Monocytes Relative 6.5 3.0 - 12.0 %   Eosinophils Relative 1.3 0.0 - 5.0 %    Basophils Relative 0.6 0.0 - 3.0 %   Neutro Abs 5.9 1.4 - 7.7 K/uL   Lymphs Abs 2.2 0.7 - 4.0 K/uL   Monocytes Absolute 0.6 0.1 - 1.0 K/uL  Eosinophils Absolute 0.1 0.0 - 0.7 K/uL   Basophils Absolute 0.1 0.0 - 0.1 K/uL  Basic metabolic panel  Result Value Ref Range   Sodium 137 135 - 145 mEq/L   Potassium 5.0 3.5 - 5.1 mEq/L   Chloride 99 96 - 112 mEq/L   CO2 32 19 - 32 mEq/L   Glucose, Bld 90 70 - 99 mg/dL   BUN 11 6 - 23 mg/dL   Creatinine, Ser 4.09 0.40 - 1.20 mg/dL   Calcium 81.1 8.4 - 91.4 mg/dL   GFR 78.29 >56.21 mL/min  DG Chest 2 View CLINICAL DATA:  Follow-up visit for congestion and productive cough with shortness of breath.  EXAM: CHEST - 2 VIEW  COMPARISON:  09/21/2018  FINDINGS: Retrocardiac density representing LEFT lower lobe pneumonia is redemonstrated, slightly improved but persistent. RIGHT lung clear. Normal cardiomediastinal silhouette. Bones unremarkable.  IMPRESSION: Slight improvement LEFT lower lobe pneumonia. Continued surveillance is warranted.  Electronically Signed   By: Elsie Stain M.D.   On: 09/30/2018 08:42   Assessment & Plan:   Problem List Items Addressed This Visit    LLL pneumonia (HCC) - Primary    Reviewed illness to date.  Very slow recovery from LLL PNA, no improvement after initial levaquin course, started improving on doxycycline. Will provide doxy refill to fill in case continued improvement not seen. Will refill tussionex for night time use only to help with rest.  Discussed rpt CXR, offered today however discussed don't anticipate much change in 5 days, rather recommended repeat CXR in 2 wks after full 4 wks of treatment for PNA. She states she will call to schedule rpt CXR.  She states she will seek a new provider after current illness.       Relevant Medications   doxycycline (VIBRA-TABS) 100 MG tablet   chlorpheniramine-HYDROcodone (TUSSIONEX PENNKINETIC ER) 10-8 MG/5ML SUER   Other Relevant Orders   DG Chest 2  View   Influenza A    Completed tamiflu, this has resolved.       Hoarseness    Ongoing for 1 month with current illness. H/o chronic hoarseness 2 yrs ago, referred to ENT at that time but didn't go. If persistent consider ENT referral.           Meds ordered this encounter  Medications  . doxycycline (VIBRA-TABS) 100 MG tablet    Sig: Take 1 tablet (100 mg total) by mouth 2 (two) times daily.    Dispense:  14 tablet    Refill:  0  . chlorpheniramine-HYDROcodone (TUSSIONEX PENNKINETIC ER) 10-8 MG/5ML SUER    Sig: Take 5 mLs by mouth at bedtime as needed for cough.    Dispense:  120 mL    Refill:  0   Orders Placed This Encounter  Procedures  . DG Chest 2 View    Standing Status:   Future    Standing Expiration Date:   12/04/2019    Order Specific Question:   Reason for Exam (SYMPTOM  OR DIAGNOSIS REQUIRED)    Answer:   f/u LLL PNA    Order Specific Question:   Is patient pregnant?    Answer:   No    Order Specific Question:   Preferred imaging location?    Answer:   [facility]    Order Specific Question:   Radiology Contrast Protocol - do NOT remove file path    Answer:   [email or web address]    Follow up plan: No follow-ups on file.  [name],  MD

## 2018-10-05 NOTE — Assessment & Plan Note (Signed)
Completed tamiflu, this has resolved.

## 2018-10-05 NOTE — Assessment & Plan Note (Addendum)
Ongoing for 1 month with current illness. H/o chronic hoarseness 2 yrs ago, referred to ENT at that time but didn't go. If persistent consider ENT referral.

## 2018-10-12 ENCOUNTER — Ambulatory Visit (INDEPENDENT_AMBULATORY_CARE_PROVIDER_SITE_OTHER): Payer: Medicare Other | Admitting: Psychology

## 2018-10-12 DIAGNOSIS — F331 Major depressive disorder, recurrent, moderate: Secondary | ICD-10-CM | POA: Diagnosis not present

## 2018-10-18 ENCOUNTER — Ambulatory Visit: Payer: Self-pay | Admitting: Neurology

## 2018-11-08 ENCOUNTER — Ambulatory Visit: Payer: Medicare Other | Admitting: Psychology

## 2018-11-23 ENCOUNTER — Ambulatory Visit: Payer: Medicare Other | Admitting: Psychology

## 2018-11-24 NOTE — Progress Notes (Signed)
Virtual Visit via Video Note The purpose of this virtual visit is to provide medical care while limiting exposure to the novel coronavirus.    Consent was obtained for video visit:  Yes.   Answered questions that patient had about telehealth interaction:  Yes.   I discussed the limitations, risks, security and privacy concerns of performing an evaluation and management service by telemedicine. I also discussed with the patient that there may be a patient responsible charge related to this service. The patient expressed understanding and agreed to proceed.  Pt location: Home Physician Location: home Name of referring provider:  [name], MD I connected with Jamie Patterson at patients initiation/request on 11/25/2018 at  1:00 PM EDT by video enabled telemedicine application and verified that I am speaking with the correct person using two identifiers. Pt MRN:  [id number] Pt DOB:  25-Oct-1960 Video Participants:  Jamie Patterson;     History of Present Illness:  Patient seen today in follow-up for tremor.  She is on primidone, 50 mg at bedtime.  She is on propranolol, 40 mg daily.  She reports that tremor has been about the same.  I reviewed other medical records.  In February, she had the flu and then subsequently developed left lower lobe pneumonia, which developed into a long-lasting cough. In the midst of all of this, her dad had died.  She did get married since our last visit (she has been with man for 7 years).  She is feeling better.  She is watching her grandkids a few days a week.  She has not yet gotten UTD on her mammograms/female examinations, etc.   Observations/Objective:   There were no vitals filed for this visit. GEN:  The patient appears stated age and is in NAD.  She is tearful, esp when talking about death of father  Neurological examination:  Orientation: The patient is alert and oriented x3. Cranial nerves: There is good facial symmetry.  The speech is fluent and clear.   Hearing is intact to conversational tone. Motor: Strength is at least antigravity x 4.   Shoulder shrug is equal and symmetric.  There is no pronator drift.  Movement examination: Tone: unable Abnormal movements: There is mild tremor of the outstretched hands bilaterally. Gait and Station: The patient ambulates well.  She has some trouble standing in the Romberg position with eyes closed (sways).    Assessment and Plan:   1. Essential Tremor, likely exacerbated by significant anxiety/depression.             -continue primidone 50 mg along with propranolol 40 mg.  Don't want to increase propranolol due to depression 2.  Possible seizure years ago.  EEG has been normal, but clinically it sounds as if the patient had a seizure.                         -has been off of wellbutrin for years and has been seizure free 3.  Chronic migraine/chronic daily headache.               -Currently doing okay in this regard, despite increased stress lately. 4.  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  Neuropsych testing was negative in this regard, demonstrating only major depression             -seeing Dr. Evelene Patterson and Jamie Patterson  -Talked again about the importance of self-care.  We discussed this in detail last visit.  She is still far behind on mammograms and yearly gynecologic exams.  She has the information for this. 5.  ? Myoclonus              -resolved with decreased gabapentin dose  Follow Up Instructions:    -I discussed the assessment and treatment plan with the patient. The patient was provided an opportunity to ask questions and all were answered. The patient agreed with the plan and demonstrated an understanding of the instructions.   The patient was advised to call back or seek an in-person evaluation if the symptoms worsen or if the condition fails to improve as anticipated.    Total Time spent in visit with the patient was:  15 min, of which more than 50% of the  time was spent in counseling, as above.   Pt understands and agrees with the plan of care outlined.     Jamie Salenebecca Tat, DO

## 2018-11-25 ENCOUNTER — Telehealth (INDEPENDENT_AMBULATORY_CARE_PROVIDER_SITE_OTHER): Payer: Medicare Other | Admitting: Neurology

## 2018-11-25 ENCOUNTER — Other Ambulatory Visit: Payer: Self-pay

## 2018-11-25 ENCOUNTER — Encounter: Payer: Self-pay | Admitting: *Deleted

## 2018-11-25 ENCOUNTER — Encounter: Payer: Self-pay | Admitting: Neurology

## 2018-11-25 DIAGNOSIS — R251 Tremor, unspecified: Secondary | ICD-10-CM

## 2018-11-25 DIAGNOSIS — F411 Generalized anxiety disorder: Secondary | ICD-10-CM | POA: Diagnosis not present

## 2018-11-25 DIAGNOSIS — F331 Major depressive disorder, recurrent, moderate: Secondary | ICD-10-CM | POA: Diagnosis not present

## 2018-11-29 ENCOUNTER — Ambulatory Visit: Payer: Self-pay | Admitting: Neurology

## 2018-12-07 ENCOUNTER — Ambulatory Visit: Payer: Medicare Other | Admitting: Psychology

## 2019-02-18 ENCOUNTER — Other Ambulatory Visit: Payer: Self-pay | Admitting: Neurology

## 2019-02-22 NOTE — Telephone Encounter (Signed)
Requested Prescriptions   Pending Prescriptions Disp Refills  . propranolol (INDERAL) 40 MG tablet [Pharmacy Med Name: PROPRANOLOL HCL 40 MG TAB] 90 tablet 2    Sig: TAKE 1 TABLET BY MOUTH ONCE DAILY  . primidone (MYSOLINE) 50 MG tablet [Pharmacy Med Name: PRIMIDONE 50 MG TAB] 90 tablet 2    Sig: TAKE 1 TABLET BY MOUTH AT BEDTIME   Rx last filled: 02/16/18 #90 2 refills on both  Pt last seen: 11/25/18   Follow up appt scheduled: 08/29/2019

## 2019-03-03 ENCOUNTER — Telehealth: Payer: Self-pay | Admitting: Family Medicine

## 2019-03-03 ENCOUNTER — Telehealth: Payer: Self-pay | Admitting: Neurology

## 2019-03-03 NOTE — Telephone Encounter (Signed)
Please inform patient that propranolol would not cause her blood pressure to be elevated as this class of medications is used to reduce blood pressure.  I do not recommend making any changes to her tremor medications.  She may want to discuss with her PCP to look for other reasons for her blood pressure to be elevated.

## 2019-03-03 NOTE — Telephone Encounter (Signed)
See below

## 2019-03-03 NOTE — Telephone Encounter (Signed)
Called spoke with patient she was informed of provider response below she states she has contacted her PCP office and they are only doing VV right not she refused to be seen via VV. And decline their appt. She called hear because she wanted Dr. Carles Collet to increase the dose of propranolol to see if that would help bring her BP down. Pt then being to be angry and upset saying that she wish Dr. Carles Collet was there because she knows her situation she say know one is welling to help her. She then says that she will increase the dose on her  medication on her own and see what happens. Hangs phone up.

## 2019-03-03 NOTE — Telephone Encounter (Signed)
New Message  Pt c/o BP issue: STAT if pt c/o blurred vision, one-sided weakness or slurred speech  1. What are your last 5 BP readings?  Wrist monitor readings: 138/93 (Today, 03/03/2019), 170/112 139/106 (Wednesday, 03/02/2019), 177/119 (Tuesday, 03/01/2019), 164/114 (Monday, 02/28/2019)  2. Are you having any other symptoms (ex. Dizziness, headache, blurred vision, passed out)? No  3. What is your BP issue? Patient verbalized propranolol is causing her bp to be high and wanting to know if the MD can change the dosage.

## 2019-03-03 NOTE — Telephone Encounter (Signed)
Just FYI  Patient called today stating she has a cough, body aches and over all just feels bad. She wanted to schedule an appointment. I advised patient that unfortunately with any sick symptoms including the ones she stated that we are unable to bring her into office but could schedule a virtual visit with a provider in the office.  Patient stated she DID NOT want to do that. I advised patient again that unfortunately the policy right now if any sick symptoms we have to do virtually but if she did not want to do that I could ask a nurse what the best thing would be for her to do.  Patient said no thank you and ended the call.

## 2019-03-06 ENCOUNTER — Encounter: Payer: Self-pay | Admitting: Primary Care

## 2019-03-10 NOTE — Telephone Encounter (Signed)
It looks like she did call PCP the same day she called me but doesn't appear that she talked to them about BP issues.  Garlon Hatchet, I'm sending this to you so you know.  I'm not back yet from time off but my partners are covering.  Pt calling with elevated BP and for some reason thought that it was due to propranolol.  Looks like she wanted appt with you.

## 2019-03-11 NOTE — Telephone Encounter (Signed)
Thank you for letting me know. I never received last week's message. At our last appointment patient was not happy with my care and had decided to seek new PCP. I will touch base with my practice manager regarding setting pt up with new PCP.

## 2019-03-11 NOTE — Telephone Encounter (Signed)
Noted  

## 2019-03-11 NOTE — Telephone Encounter (Signed)
I spoke with patient and set her up with Alma Friendly, NP for BP follow up. Pt was seen at urgent care and would like to follow up and transfer care. She was swabbed for covid and is awaiting results. She said her symptoms have subsided and she is much better and passed the covid screen. She agreed to call the office if she receives a positive result or has any symptoms prior to the 8/5 appt.

## 2019-03-16 ENCOUNTER — Ambulatory Visit: Payer: Medicare Other | Admitting: Primary Care

## 2019-03-16 ENCOUNTER — Encounter: Payer: Self-pay | Admitting: Primary Care

## 2019-03-16 ENCOUNTER — Other Ambulatory Visit: Payer: Self-pay

## 2019-03-16 VITALS — BP 162/102 | HR 78 | Temp 97.9°F | Ht 67.0 in | Wt 224.2 lb

## 2019-03-16 DIAGNOSIS — I10 Essential (primary) hypertension: Secondary | ICD-10-CM

## 2019-03-16 DIAGNOSIS — R251 Tremor, unspecified: Secondary | ICD-10-CM

## 2019-03-16 DIAGNOSIS — E538 Deficiency of other specified B group vitamins: Secondary | ICD-10-CM | POA: Diagnosis not present

## 2019-03-16 DIAGNOSIS — R05 Cough: Secondary | ICD-10-CM | POA: Diagnosis not present

## 2019-03-16 DIAGNOSIS — R053 Chronic cough: Secondary | ICD-10-CM

## 2019-03-16 DIAGNOSIS — D509 Iron deficiency anemia, unspecified: Secondary | ICD-10-CM

## 2019-03-16 DIAGNOSIS — E7800 Pure hypercholesterolemia, unspecified: Secondary | ICD-10-CM

## 2019-03-16 DIAGNOSIS — G40909 Epilepsy, unspecified, not intractable, without status epilepticus: Secondary | ICD-10-CM

## 2019-03-16 DIAGNOSIS — F331 Major depressive disorder, recurrent, moderate: Secondary | ICD-10-CM

## 2019-03-16 MED ORDER — HYDROCHLOROTHIAZIDE 25 MG PO TABS: 25.0000 mg | ORAL_TABLET | Freq: Every day | ORAL | 0 refills | Status: DC

## 2019-03-16 NOTE — Assessment & Plan Note (Signed)
Feels well managed, following with psychiatry. Continue current regimen.  

## 2019-03-16 NOTE — Assessment & Plan Note (Signed)
Following with neurology, seizure free since 2013. Continue current regimen.

## 2019-03-16 NOTE — Assessment & Plan Note (Signed)
Above goal today, also with home readings and prior office readings. Rx for HCTZ 25 mg sent to pharmacy. We will plan to see her back in 2-3 weeks for BP check and labs.

## 2019-03-16 NOTE — Patient Instructions (Signed)
Start hydrochlorothiazide 25 mg tablets once daily for high blood pressure. Take this in the morning.  Start monitoring your blood pressure daily, around the same time of day, for the next 2-3 weeks.  Ensure that you have rested for 30 minutes prior to checking your blood pressure. Record your readings and bring them to your next visit.  Schedule a follow up visit with me in 2-3 weeks for blood pressure check.  It was a pleasure to see you today!

## 2019-03-16 NOTE — Assessment & Plan Note (Signed)
No recent lipid panel on file, repeat lipids next visit.

## 2019-03-16 NOTE — Assessment & Plan Note (Signed)
Following with neurology, feels well managed.

## 2019-03-16 NOTE — Assessment & Plan Note (Signed)
CBC from February 2020 stable. Continue oral iron.

## 2019-03-16 NOTE — Progress Notes (Signed)
Subjective:    Patient ID: Jamie Patterson, female    DOB: Apr 14, 1961, 58 y.o.   MRN: [id number]  HPI  Jamie Patterson is a 58 year old female who presents today to transfer care from Dr. Sharen Hones.  1) Seizure Disorder/Tremor: Following with neurology, Dr. Arbutus Leas Currently managed primidone 50 mg HS, propranolol 40 mg daily. Her last seizure was in 2013.   2) MDD: Currently managed on duloxetine 60 mg, escitalopram 10 mg, lamotrigine 150 mg BID, gabapentin 300 mg in the morning and 600 mg at night, and tizanidine for sleep at bedtime. She is following with psychiatry, feels well managed overall. She is still grieving the loss of her father from February 2020.  3) Iron Deficiency Anemia/B12 Deficiency: Currently managed on oral B12 1000 mcg daily and ferrous sulfate 325 mg daily. CBC from February 2020 with hemoglobin of 14.7. No recent B12 on file.  4) Chronic Cough: Currently managed on Symbicort and Singulair for which she uses just as needed. No recent issues. This was mostly after her diagnosis of pneumonia in February 2020.  5) Essential Hypertension: Prior history, once managed on lisinopril 10 mg but has not taken since 2015 due to "low BP readings".  She's been checking her BP at home three times daily for two weeks and is getting readings of 140's-170's/80's-110's. She has noticed more headaches, no chest pain.  BP Readings from Last 3 Encounters:  03/16/19 (!) 162/102  10/05/18 128/68  09/29/18 (!) 140/112     Review of Systems  Eyes: Negative for visual disturbance.  Respiratory: Negative for shortness of breath.   Cardiovascular: Negative for chest pain.  Neurological: Positive for headaches. Negative for dizziness.       Chronic headaches that have increased       Past Medical History:  Diagnosis Date  . Amblyopia   . Benign essential tremor    worsened by anxiety  . Chronic pain due to injury    at R ankle due to ankle fracture  . Depression    followed by psych  . History  of hypertension   . HLD (hyperlipidemia)   . Imbalance    likely multifactorial (s/p balance training at [facility] 08/2010) ?CMT  . Iron deficiency anemia   . Right ankle pain 2003   trimalleolar fx s/p fusion  . Seizure (HCC)    focal, ex vacuo ventriculomegaly?, pending neuro w/u with sleep deprived EEG     Social History   Socioeconomic History  . Marital status: Divorced    Spouse name: Not on file  . Number of children: 1  . Years of education: Not on file  . Highest education level: Not on file  Occupational History  . Occupation: Disabled    Comment: depression  . Occupation: DISABILITY    Employer: UNEMPLOYED  Social Needs  . Financial resource strain: Not on file  . Food insecurity    Worry: Not on file    Inability: Not on file  . Transportation needs    Medical: Not on file    Non-medical: Not on file  Tobacco Use  . Smoking status: Former Smoker    Packs/day: 1.00    Years: 20.00    Pack years: 20.00    Quit date: 08/12/1995    Years since quitting: 23.6  . Smokeless tobacco: Never Used  Substance and Sexual Activity  . Alcohol use: No    Alcohol/week: 0.0 standard drinks    Comment: None for > 1 year  .  Drug use: No  . Sexual activity: Not on file  Lifestyle  . Physical activity    Days per week: Not on file    Minutes per session: Not on file  . Stress: Not on file  Relationships  . Social Musician on phone: Not on file    Gets together: Not on file    Attends religious service: Not on file    Active member of club or organization: Not on file    Attends meetings of clubs or organizations: Not on file    Relationship status: Not on file  . Intimate partner violence    Fear of current or ex partner: Not on file    Emotionally abused: Not on file    Physically abused: Not on file    Forced sexual activity: Not on file  Other Topics Concern  . Not on file  Social History Narrative   No caffeine. Divorced. 1 child. 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 Has a cane but hasn't needed it recently, although endorses that she falls a lot     Past Surgical History:  Procedure Laterality Date  . ANKLE FUSION  02/2004   Right-50% disability   . CARPAL TUNNEL RELEASE  12/2017   L CTR repair, R thumb A1 pulley release (Gramig)  . CESAREAN SECTION    . hospitalization  08/2011   syncope thought 2/2 seizure, MRI - Hyperintensity in the cerebral white matter bilaterally is nonspecific, started on Keppra  . ORIF ANKLE FRACTURE  06/2003   Right    Family History  Problem Relation Age of Onset  . Depression Mother   . Arrhythmia Mother 57       Dx with VTach after having sudden collapse, did NOT have h/o AMI's   . Atrial fibrillation Father   . Hypertension Father   . Osteoarthritis Father   . Aortic aneurysm Father   . Hepatitis Brother        Hep. C  . Anemia Sister     No Known Allergies  Current Outpatient Medications on File Prior to Visit  Medication Sig Dispense Refill  . acetaminophen (TYLENOL) 500 MG tablet Take 500 mg by mouth as needed.     Marland Kitchen albuterol (VENTOLIN HFA) 108 (90 Base) MCG/ACT inhaler Inhale into the lungs every 6 (six) hours as needed.    . Ascorbic Acid (VITAMIN C) 1000 MG tablet Take 1,000 mg by mouth daily.    Marland Kitchen aspirin EC 81 MG tablet Take 81 mg by mouth every evening.      . budesonide-formoterol (SYMBICORT) 160-4.5 MCG/ACT inhaler Inhale 2 puffs into the lungs 2 (two) times daily. Rinse mouth after use 1 Inhaler 0  . cycloSPORINE (RESTASIS) 0.05 % ophthalmic emulsion Place 1 drop into both eyes 2 (two) times daily.    . DULoxetine (CYMBALTA) 60 MG capsule Take 60 mg by mouth 2 (two) times daily.     Marland Kitchen escitalopram (LEXAPRO) 10 MG tablet Take 10 mg by mouth daily.     . ferrous sulfate 325 (65 FE) MG tablet Take 325 mg by mouth daily with breakfast.    . gabapentin (NEURONTIN) 300 MG capsule Take 300 mg by mouth. Take 1 capsule in the morning and 2 capsules in the  evening.    . lamoTRIgine (LAMICTAL) 150 MG tablet TAKE 1 TABLET BY MOUTH TWICE A DAY 180 tablet 1  . MAGNESIUM PO Take by mouth daily.    Marland Kitchen  Melatonin 10 MG TABS Take 2 tablets by mouth as needed.    . montelukast (SINGULAIR) 10 MG tablet Take 1 tablet (10 mg total) by mouth at bedtime. 30 tablet 3  . Multiple Vitamin (MULTIVITAMIN) tablet Take 1 tablet by mouth daily.    . naproxen sodium (ANAPROX) 220 MG tablet Take 440 mg by mouth as needed.     . primidone (MYSOLINE) 50 MG tablet TAKE 1 TABLET BY MOUTH AT BEDTIME 90 tablet 2  . propranolol (INDERAL) 40 MG tablet TAKE 1 TABLET BY MOUTH ONCE DAILY 90 tablet 2  . tiZANidine (ZANAFLEX) 2 MG tablet Take 2 mg by mouth at bedtime.    . vitamin B-12 (CYANOCOBALAMIN) 1000 MCG tablet Take 500 mcg by mouth daily.      Current Facility-Administered Medications on File Prior to Visit  Medication Dose Route Frequency Provider Last Rate Last Dose  . ipratropium-albuterol (DUONEB) 0.5-2.5 (3) MG/3ML nebulizer solution 3 mL  3 mL Nebulization Q6H Nche, Bonna Gains, NP   3 mL at 09/21/18 1547    BP (!) 162/102   Pulse 78   Temp 97.9 F (36.6 C) (Temporal)   Ht 5\' 7"  (1.702 m)   Wt 224 lb 4 oz (101.7 kg)   LMP 06/01/2013   SpO2 97%   BMI 35.12 kg/m    Objective:   Physical Exam  Constitutional: She appears well-nourished.  Neck: Neck supple.  Cardiovascular: Normal rate and regular rhythm.  Respiratory: Effort normal and breath sounds normal.  Skin: Skin is warm and dry.  Psychiatric: She has a normal mood and affect.           Assessment & Plan:

## 2019-03-16 NOTE — Assessment & Plan Note (Signed)
Compliant to B12 1000 mcg daily, no recent lab on file. Check B12 level next visit.

## 2019-03-16 NOTE — Assessment & Plan Note (Signed)
Resolved. No recent use of inhalers or Singulair.

## 2019-03-31 LAB — HM MAMMOGRAPHY

## 2019-04-05 LAB — HM PAP SMEAR: HM Pap smear: NEGATIVE

## 2019-04-07 ENCOUNTER — Ambulatory Visit: Payer: Medicare Other | Admitting: Primary Care

## 2019-04-07 ENCOUNTER — Other Ambulatory Visit: Payer: Self-pay

## 2019-04-07 ENCOUNTER — Encounter: Payer: Self-pay | Admitting: Primary Care

## 2019-04-07 VITALS — BP 136/90 | HR 77 | Temp 98.2°F | Ht 67.0 in | Wt 223.8 lb

## 2019-04-07 DIAGNOSIS — Z23 Encounter for immunization: Secondary | ICD-10-CM

## 2019-04-07 DIAGNOSIS — I10 Essential (primary) hypertension: Secondary | ICD-10-CM | POA: Diagnosis not present

## 2019-04-07 LAB — BASIC METABOLIC PANEL
BUN: 8 mg/dL (ref 6–23)
CO2: 31 mEq/L (ref 19–32)
Calcium: 9.5 mg/dL (ref 8.4–10.5)
Chloride: 98 mEq/L (ref 96–112)
Creatinine, Ser: 0.68 mg/dL (ref 0.40–1.20)
GFR: 89.09 mL/min (ref >60.00)
Glucose, Bld: 99 mg/dL (ref 70–99)
Potassium: 3.9 mEq/L (ref 3.5–5.1)
Sodium: 136 mEq/L (ref 135–145)

## 2019-04-07 LAB — HM DEXA SCAN

## 2019-04-07 MED ORDER — HYDROCHLOROTHIAZIDE 25 MG PO TABS: 25.0000 mg | ORAL_TABLET | Freq: Every day | ORAL | 3 refills | Status: DC

## 2019-04-07 MED ORDER — LISINOPRIL 10 MG PO TABS: 10.0000 mg | ORAL_TABLET | Freq: Every day | ORAL | 0 refills | Status: DC

## 2019-04-07 NOTE — Addendum Note (Signed)
Addended by: Jacqualin Combes on: 04/07/2019 11:24 AM   Modules accepted: Orders

## 2019-04-07 NOTE — Progress Notes (Signed)
Subjective:    Patient ID: Jamie Patterson, female    DOB: 1960/12/26, 58 y.o.   MRN: [id number]  HPI  Jamie Patterson is a 58 year old female who presents today for follow up of hypertension.  She was last evaluated on 03/16/19 as a transfer care patient from Dr. Sharen Hones. She had a history of hypertension and was once managed on lisinopril 10 mg, had not taken since 2015 due to improvement in BP. Over the last prior weeks he had been getting BP readings of 140-170's/110's. Office reading of 162/102 so we initiated HCTZ 25 mg and asked for her to follow up 2-3 weeks later.  Since her last visit she's been checking her BP at home and is getting varied readings. Some readings include:  150/102, 118/69, 146/92, 93/53, 152/97, 144/87, 156/109, 125/86, 114/73, 120/74, 86/53 with 145/96 on repeat.  BP Readings from Last 3 Encounters:  04/07/19 136/90  03/16/19 (!) 162/102  10/05/18 128/68     Review of Systems  Eyes: Negative for visual disturbance.  Respiratory: Negative for shortness of breath.   Cardiovascular: Negative for chest pain.  Neurological: Negative for dizziness and headaches.       Past Medical History:  Diagnosis Date  . Amblyopia   . ARTHRITIS, TRAUMATIC, ANKLE 08/07/2009   Trimalleolar fracture on right s/p fusion    . Benign essential tremor    worsened by anxiety  . Chronic pain due to injury    at R ankle due to ankle fracture  . Depression    followed by psych  . History of hypertension   . HLD (hyperlipidemia)   . Imbalance    likely multifactorial (s/p balance training at [facility] 08/2010) ?CMT  . Influenza A 09/15/2018  . Iron deficiency anemia   . LLL pneumonia (HCC) 10/05/2018  . Right ankle pain 2003   trimalleolar fx s/p fusion  . Seizure (HCC)    focal, ex vacuo ventriculomegaly?, pending neuro w/u with sleep deprived EEG  . Syncope and collapse 08/19/2011     Social History   Socioeconomic History  . Marital status: Divorced    Spouse name: Not on file   . Number of children: 1  . Years of education: Not on file  . Highest education level: Not on file  Occupational History  . Occupation: Disabled    Comment: depression  . Occupation: DISABILITY    Employer: UNEMPLOYED  Social Needs  . Financial resource strain: Not on file  . Food insecurity    Worry: Not on file    Inability: Not on file  . Transportation needs    Medical: Not on file    Non-medical: Not on file  Tobacco Use  . Smoking status: Former Smoker    Packs/day: 1.00    Years: 20.00    Pack years: 20.00    Quit date: 08/12/1995    Years since quitting: 23.6  . Smokeless tobacco: Never Used  Substance and Sexual Activity  . Alcohol use: No    Alcohol/week: 0.0 standard drinks    Comment: None for > 1 year  . Drug use: No  . Sexual activity: Not on file  Lifestyle  . Physical activity    Days per week: Not on file    Minutes per session: Not on file  . Stress: Not on file  Relationships  . Social Musician on phone: Not on file    Gets together: Not on file  Attends religious service: Not on file    Active member of club or organization: Not on file    Attends meetings of clubs or organizations: Not on file    Relationship status: Not on file  . Intimate partner violence    Fear of current or ex partner: Not on file    Emotionally abused: Not on file    Physically abused: Not on file    Forced sexual activity: Not on file  Other Topics Concern  . Not on file  Social History Narrative   No caffeine. Divorced. 1 child. 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, although endorses that she falls a lot     Past Surgical History:  Procedure Laterality Date  . ANKLE FUSION  02/2004   Right-50% disability   . CARPAL TUNNEL RELEASE  12/2017   L CTR repair, R thumb A1 pulley release (Gramig)  . CESAREAN SECTION    . hospitalization  08/2011   syncope thought 2/2 seizure, MRI -  Hyperintensity in the cerebral white matter bilaterally is nonspecific, started on Keppra  . ORIF ANKLE FRACTURE  06/2003   Right    Family History  Problem Relation Age of Onset  . Depression Mother   . Arrhythmia Mother 46       Dx with VTach after having sudden collapse, did NOT have h/o AMI's   . Atrial fibrillation Father   . Hypertension Father   . Osteoarthritis Father   . Aortic aneurysm Father   . Hepatitis Brother        Hep. C  . Anemia Sister     No Known Allergies  Current Outpatient Medications on File Prior to Visit  Medication Sig Dispense Refill  . acetaminophen (TYLENOL) 500 MG tablet Take 500 mg by mouth as needed.     Marland Kitchen albuterol (VENTOLIN HFA) 108 (90 Base) MCG/ACT inhaler Inhale into the lungs every 6 (six) hours as needed.    . Ascorbic Acid (VITAMIN C) 1000 MG tablet Take 1,000 mg by mouth daily.    Marland Kitchen aspirin EC 81 MG tablet Take 81 mg by mouth every evening.      . budesonide-formoterol (SYMBICORT) 160-4.5 MCG/ACT inhaler Inhale 2 puffs into the lungs 2 (two) times daily. Rinse mouth after use (Patient taking differently: Inhale 2 puffs into the lungs 2 (two) times daily as needed. Rinse mouth after use) 1 Inhaler 0  . cycloSPORINE (RESTASIS) 0.05 % ophthalmic emulsion Place 1 drop into both eyes 2 (two) times daily.    . DULoxetine (CYMBALTA) 60 MG capsule Take 60 mg by mouth 2 (two) times daily.     Marland Kitchen escitalopram (LEXAPRO) 10 MG tablet Take 10 mg by mouth daily.     . ferrous sulfate 325 (65 FE) MG tablet Take 325 mg by mouth daily with breakfast.    . gabapentin (NEURONTIN) 300 MG capsule Take 300 mg by mouth. Take 1 capsule in the morning and 2 capsules in the evening.    . lamoTRIgine (LAMICTAL) 150 MG tablet TAKE 1 TABLET BY MOUTH TWICE A DAY 180 tablet 1  . MAGNESIUM PO Take 400 mg by mouth daily.     . Melatonin 10 MG TABS Take 2 tablets by mouth at bedtime.     . montelukast (SINGULAIR) 10 MG tablet Take 1 tablet (10 mg total) by mouth at  bedtime. (Patient taking differently: Take 10 mg by mouth at bedtime as needed. )  30 tablet 3  . Multiple Vitamin (MULTIVITAMIN) tablet Take 1 tablet by mouth daily.    . naproxen sodium (ANAPROX) 220 MG tablet Take 440 mg by mouth as needed.     . primidone (MYSOLINE) 50 MG tablet TAKE 1 TABLET BY MOUTH AT BEDTIME 90 tablet 2  . propranolol (INDERAL) 40 MG tablet TAKE 1 TABLET BY MOUTH ONCE DAILY 90 tablet 2  . tiZANidine (ZANAFLEX) 2 MG tablet Take 2 mg by mouth at bedtime.    . vitamin B-12 (CYANOCOBALAMIN) 1000 MCG tablet Take 500 mcg by mouth daily.      Current Facility-Administered Medications on File Prior to Visit  Medication Dose Route Frequency Provider Last Rate Last Dose  . ipratropium-albuterol (DUONEB) 0.5-2.5 (3) MG/3ML nebulizer solution 3 mL  3 mL Nebulization Q6H Nche, Bonna Gains, NP   3 mL at 09/21/18 1547    BP 136/90   Pulse 77   Temp 98.2 F (36.8 C) (Temporal)   Ht 5\' 7"  (1.702 m)   Wt 223 lb 12 oz (101.5 kg)   LMP 06/01/2013   SpO2 98%   BMI 35.04 kg/m    Objective:   Physical Exam  Constitutional: She appears well-nourished.  Neck: Neck supple.  Cardiovascular: Normal rate and regular rhythm.  Respiratory: Effort normal and breath sounds normal.  Skin: Skin is warm and dry.           Assessment & Plan:

## 2019-04-07 NOTE — Assessment & Plan Note (Signed)
Improved today but still above goal. Home readings are varied but is asymptomatic to lower readings.  Continue HCTZ 25 mg, add in lisinopril 10 mg daily. BMP pending today. Follow up in 2-3 weeks for BP check.

## 2019-04-07 NOTE — Patient Instructions (Signed)
Stop by the lab prior to leaving today. I will notify you of your results once received.   Continue taking hydrochlorothiazide 25 mg tablets once daily for blood pressure.  Start taking lisinopril 10 mg tablet once daily for blood pressure.  Schedule a follow up visit for 2-3 weeks for blood pressure check.  It was a pleasure to see you today!

## 2019-04-12 ENCOUNTER — Other Ambulatory Visit: Payer: Self-pay | Admitting: Primary Care

## 2019-04-12 DIAGNOSIS — I10 Essential (primary) hypertension: Secondary | ICD-10-CM

## 2019-04-27 ENCOUNTER — Ambulatory Visit (INDEPENDENT_AMBULATORY_CARE_PROVIDER_SITE_OTHER): Payer: Medicare Other | Admitting: Primary Care

## 2019-04-27 ENCOUNTER — Encounter: Payer: Self-pay | Admitting: Primary Care

## 2019-04-27 ENCOUNTER — Other Ambulatory Visit: Payer: Self-pay

## 2019-04-27 VITALS — BP 140/98 | HR 78 | Temp 97.7°F | Ht 67.0 in | Wt 227.5 lb

## 2019-04-27 DIAGNOSIS — I10 Essential (primary) hypertension: Secondary | ICD-10-CM

## 2019-04-27 DIAGNOSIS — E7800 Pure hypercholesterolemia, unspecified: Secondary | ICD-10-CM | POA: Diagnosis not present

## 2019-04-27 LAB — COMPREHENSIVE METABOLIC PANEL
ALT: 25 U/L (ref 0–35)
AST: 27 U/L (ref 0–37)
Albumin: 4.4 g/dL (ref 3.5–5.2)
Alkaline Phosphatase: 94 U/L (ref 39–117)
BUN: 9 mg/dL (ref 6–23)
CO2: 30 mEq/L (ref 19–32)
Calcium: 9.7 mg/dL (ref 8.4–10.5)
Chloride: 98 mEq/L (ref 96–112)
Creatinine, Ser: 0.71 mg/dL (ref 0.40–1.20)
GFR: 84.74 mL/min (ref >60.00)
Glucose, Bld: 112 mg/dL — ABNORMAL HIGH (ref 70–99)
Potassium: 4.1 mEq/L (ref 3.5–5.1)
Sodium: 134 mEq/L — ABNORMAL LOW (ref 135–145)
Total Bilirubin: 0.4 mg/dL (ref 0.2–1.2)
Total Protein: 7 g/dL (ref 6.0–8.3)

## 2019-04-27 LAB — LIPID PANEL
Cholesterol: 276 mg/dL — ABNORMAL HIGH (ref 0–200)
HDL: 71.7 mg/dL (ref >39.00)
LDL Cholesterol: 170 mg/dL — ABNORMAL HIGH (ref 0–99)
NonHDL: 204.67
Total CHOL/HDL Ratio: 4
Triglycerides: 172 mg/dL — ABNORMAL HIGH (ref 0.0–149.0)
VLDL: 34.4 mg/dL (ref 0.0–40.0)

## 2019-04-27 MED ORDER — LISINOPRIL 10 MG PO TABS: 20.0000 mg | ORAL_TABLET | Freq: Every day | ORAL | 0 refills | Status: DC

## 2019-04-27 NOTE — Patient Instructions (Signed)
Continue hydrochlorothiazide 25 mg tablets daily for blood pressure.  We've increased your dose of lisinopril to 20 mg. You may take two of the 10 mg tablets for now.  Continue to monitor your blood pressure.  Take your blood pressure medications in the morning or separate them.  Stop by the lab prior to leaving today. I will notify you of your results once received.   Send me your blood pressure readings in two weeks via My Chart.  It was a pleasure to see you today!

## 2019-04-27 NOTE — Progress Notes (Signed)
Subjective:    Patient ID: Jamie Patterson, female    DOB: 11/15/1960, 58 y.o.   MRN: [id number]  HPI  Jamie Patterson is a 58 year old female with a history of hypertension, seizure disorder, hyperlipidemia, who presents today for follow up of hypertension.  She was last evaluated on 04/07/19 for follow up of hypertension. BP readings were above goal on HCTZ 25 mg daily so lisinopril 10 mg was added.  Since her last visit she checking her BP at home which is ranging 130's-170's/80's-100's. Today she endorses that she "passed out" when attempting to get out of bed three nights ago. She hit her head and has had headaches since with improvement today. She is taking her BP medications at night.   She denies dizziness, chest pain, shortness of breath. Her headaches have improved, she is managed on aspirin 81 mg.  . BP Readings from Last 3 Encounters:  04/27/19 (!) 140/98  04/07/19 136/90  03/16/19 (!) 162/102     Review of Systems  Eyes: Negative for visual disturbance.  Respiratory: Negative for shortness of breath.   Cardiovascular: Negative for chest pain.  Neurological: Positive for headaches. Negative for dizziness.       Past Medical History:  Diagnosis Date  . Amblyopia   . ARTHRITIS, TRAUMATIC, ANKLE 08/07/2009   Trimalleolar fracture on right s/p fusion    . Benign essential tremor    worsened by anxiety  . Chronic pain due to injury    at R ankle due to ankle fracture  . Depression    followed by psych  . History of hypertension   . HLD (hyperlipidemia)   . Imbalance    likely multifactorial (s/p balance training at [facility] 08/2010) ?CMT  . Influenza A 09/15/2018  . Iron deficiency anemia   . LLL pneumonia (HCC) 10/05/2018  . Right ankle pain 2003   trimalleolar fx s/p fusion  . Seizure (HCC)    focal, ex vacuo ventriculomegaly?, pending neuro w/u with sleep deprived EEG  . Syncope and collapse 08/19/2011     Social History   Socioeconomic History  . Marital status:  Divorced    Spouse name: Not on file  . Number of children: 1  . Years of education: Not on file  . Highest education level: Not on file  Occupational History  . Occupation: Disabled    Comment: depression  . Occupation: DISABILITY    Employer: UNEMPLOYED  Social Needs  . Financial resource strain: Not on file  . Food insecurity    Worry: Not on file    Inability: Not on file  . Transportation needs    Medical: Not on file    Non-medical: Not on file  Tobacco Use  . Smoking status: Former Smoker    Packs/day: 1.00    Years: 20.00    Pack years: 20.00    Quit date: 08/12/1995    Years since quitting: 23.7  . Smokeless tobacco: Never Used  Substance and Sexual Activity  . Alcohol use: No    Alcohol/week: 0.0 standard drinks    Comment: None for > 1 year  . Drug use: No  . Sexual activity: Not on file  Lifestyle  . Physical activity    Days per week: Not on file    Minutes per session: Not on file  . Stress: Not on file  Relationships  . Social Musician on phone: Not on file    Gets together: Not on  file    Attends religious service: Not on file    Active member of club or organization: Not on file    Attends meetings of clubs or organizations: Not on file    Relationship status: Not on file  . Intimate partner violence    Fear of current or ex partner: Not on file    Emotionally abused: Not on file    Physically abused: Not on file    Forced sexual activity: Not on file  Other Topics Concern  . Not on file  Social History Narrative   No caffeine. Divorced. 1 child. 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 Has a cane but hasn't needed it recently, although endorses that she falls a lot     Past Surgical History:  Procedure Laterality Date  . ANKLE FUSION  02/2004   Right-50% disability   . CARPAL TUNNEL RELEASE  12/2017   L CTR repair, R thumb A1 pulley release (Gramig)  . CESAREAN SECTION    . hospitalization  08/2011    syncope thought 2/2 seizure, MRI - Hyperintensity in the cerebral white matter bilaterally is nonspecific, started on Keppra  . ORIF ANKLE FRACTURE  06/2003   Right    Family History  Problem Relation Age of Onset  . Depression Mother   . Arrhythmia Mother [id number]       Dx with VTach after having sudden collapse, did NOT have h/o AMI's   . Atrial fibrillation Father   . Hypertension Father   . Osteoarthritis Father   . Aortic aneurysm Father   . Hepatitis Brother        Hep. C  . Anemia Sister     No Known Allergies  Current Outpatient Medications on File Prior to Visit  Medication Sig Dispense Refill  . acetaminophen (TYLENOL) 500 MG tablet Take 500 mg by mouth as needed.     Marland Kitchen. albuterol (VENTOLIN HFA) 108 (90 Base) MCG/ACT inhaler Inhale into the lungs every 6 (six) hours as needed.    . Ascorbic Acid (VITAMIN C) 1000 MG tablet Take 1,000 mg by mouth daily.    Marland Kitchen. aspirin EC 81 MG tablet Take 81 mg by mouth every evening.      . budesonide-formoterol (SYMBICORT) 160-4.5 MCG/ACT inhaler Inhale 2 puffs into the lungs 2 (two) times daily. Rinse mouth after use (Patient taking differently: Inhale 2 puffs into the lungs 2 (two) times daily as needed. Rinse mouth after use) 1 Inhaler 0  . cycloSPORINE (RESTASIS) 0.05 % ophthalmic emulsion Place 1 drop into both eyes 2 (two) times daily.    . DULoxetine (CYMBALTA) 60 MG capsule Take 60 mg by mouth 2 (two) times daily.     Marland Kitchen. escitalopram (LEXAPRO) 10 MG tablet Take 10 mg by mouth daily.     . ferrous sulfate 325 (65 FE) MG tablet Take 325 mg by mouth daily with breakfast.    . gabapentin (NEURONTIN) 300 MG capsule Take 300 mg by mouth. Take 1 capsule in the morning and 2 capsules in the evening.    . hydrochlorothiazide (HYDRODIURIL) 25 MG tablet Take 1 tablet (25 mg total) by mouth daily. For blood pressure. 90 tablet 3  . lamoTRIgine (LAMICTAL) 150 MG tablet TAKE 1 TABLET BY MOUTH TWICE A DAY 180 tablet 1  . MAGNESIUM PO Take 400 mg by mouth  3 (three) times daily.     . Melatonin 10 MG TABS Take 2 tablets by mouth at bedtime.     .Marland Kitchen  montelukast (SINGULAIR) 10 MG tablet Take 1 tablet (10 mg total) by mouth at bedtime. (Patient taking differently: Take 10 mg by mouth at bedtime as needed. ) 30 tablet 3  . Multiple Vitamin (MULTIVITAMIN) tablet Take 1 tablet by mouth daily.    . naproxen sodium (ANAPROX) 220 MG tablet Take 440 mg by mouth as needed.     . primidone (MYSOLINE) 50 MG tablet TAKE 1 TABLET BY MOUTH AT BEDTIME 90 tablet 2  . propranolol (INDERAL) 40 MG tablet TAKE 1 TABLET BY MOUTH ONCE DAILY 90 tablet 2  . tiZANidine (ZANAFLEX) 2 MG tablet Take 2 mg by mouth at bedtime.    . vitamin B-12 (CYANOCOBALAMIN) 1000 MCG tablet Take 500 mcg by mouth daily.      Current Facility-Administered Medications on File Prior to Visit  Medication Dose Route Frequency Provider Last Rate Last Dose  . ipratropium-albuterol (DUONEB) 0.5-2.5 (3) MG/3ML nebulizer solution 3 mL  3 mL Nebulization Q6H Nche, Charlene Brooke, NP   3 mL at 09/21/18 1547    BP (!) 140/98   Pulse 78   Temp 97.7 F (36.5 C) (Temporal)   Ht 5\' 7"  (1.702 m)   Wt 227 lb 8 oz (103.2 kg)   LMP 06/01/2013   SpO2 98%   BMI 35.63 kg/m    Objective:   Physical Exam  Constitutional: She appears well-nourished.  Neck: Neck supple.  Cardiovascular: Normal rate and regular rhythm.  Respiratory: Effort normal and breath sounds normal.  Skin: Skin is warm and dry.  Psychiatric: She has a normal mood and affect.           Assessment & Plan:

## 2019-04-27 NOTE — Assessment & Plan Note (Signed)
Increased since last visit with addition of lisinopril 10 mg to HCTZ 25 mg, unclear why.  Increase lisinopril to 20 mg, continue HCTZ 25 for now. Also on propranolol for tremors.   Discussed to take BP meds in the morning or to separate with morning and evening given recent fall.   BMP pending today.  She will send me readings in two weeks via my chart.

## 2019-04-28 DIAGNOSIS — I10 Essential (primary) hypertension: Secondary | ICD-10-CM

## 2019-04-28 MED ORDER — LISINOPRIL 10 MG PO TABS: 20.0000 mg | ORAL_TABLET | Freq: Every day | ORAL | 0 refills | Status: DC

## 2019-06-02 ENCOUNTER — Ambulatory Visit: Payer: Medicare Other

## 2019-06-02 VITALS — BP 130/80

## 2019-06-02 DIAGNOSIS — I10 Essential (primary) hypertension: Secondary | ICD-10-CM

## 2019-06-02 NOTE — Progress Notes (Signed)
Pt came in for Curbside BP Check per recent [place].   It was a bit hard to hear but I got a reading of 130/80 in the left arm. She checked it with her wrist cuff on the left arm afterwards and got 154/73.  She said she is getting diastolic numbers over 062 in the afternoons along with headaches.

## 2019-06-03 MED ORDER — LISINOPRIL 30 MG PO TABS: 30.0000 mg | ORAL_TABLET | Freq: Every day | ORAL | 0 refills | Status: DC

## 2019-07-04 DIAGNOSIS — I10 Essential (primary) hypertension: Secondary | ICD-10-CM

## 2019-07-04 NOTE — Telephone Encounter (Signed)
Jamie Patterson, can you help her?

## 2019-07-05 MED ORDER — AMLODIPINE BESYLATE 5 MG PO TABS: 5.0000 mg | ORAL_TABLET | Freq: Every day | ORAL | 0 refills | Status: DC

## 2019-07-05 NOTE — Telephone Encounter (Signed)
This has been addressed.

## 2019-07-16 ENCOUNTER — Other Ambulatory Visit: Payer: Self-pay | Admitting: Primary Care

## 2019-07-16 DIAGNOSIS — I10 Essential (primary) hypertension: Secondary | ICD-10-CM

## 2019-07-18 NOTE — Telephone Encounter (Signed)
Refill sent to pharmacy.   

## 2019-07-18 NOTE — Telephone Encounter (Signed)
Last prescribed on 06/03/2019 . Last appointment on 04/27/2019. No future appointment

## 2019-08-01 ENCOUNTER — Ambulatory Visit (INDEPENDENT_AMBULATORY_CARE_PROVIDER_SITE_OTHER): Payer: Medicare Other | Admitting: Primary Care

## 2019-08-01 ENCOUNTER — Other Ambulatory Visit: Payer: Self-pay

## 2019-08-01 VITALS — BP 142/78 | HR 80 | Temp 97.5°F | Ht 67.0 in | Wt 238.2 lb

## 2019-08-01 DIAGNOSIS — I10 Essential (primary) hypertension: Secondary | ICD-10-CM

## 2019-08-01 MED ORDER — AMLODIPINE BESYLATE 10 MG PO TABS: 10.0000 mg | ORAL_TABLET | Freq: Every day | ORAL | 3 refills | Status: DC

## 2019-08-01 NOTE — Patient Instructions (Addendum)
Continue Amlodipine 10 mg along with hydrochlorothiazide 25 mg and lisinopril 30 mg for blood pressure.  Please schedule a physical with me in 6 months. You may also schedule a lab only appointment 3-4 days prior. We will discuss your lab results in detail during your physical.  It was a pleasure to see you today!

## 2019-08-01 NOTE — Progress Notes (Signed)
Subjective:    Patient ID: Jamie Patterson, female    DOB: [birth year], 58 y.o.   MRN: [id number]  HPI  Jamie Patterson is a 58 year old female with a history of hypertension, seizure disorder, hyperlipidemia, anemia who presents today for follow up of hypertension.  She is currently managed on Amlodipine 5 mg, HCTZ 25 mg, Lisinopril 30 mg. We last added Amlodipine 5-10 mg due to continued elevated blood pressure readings that the patient was getting at home.  Since initiation of Amlodipine she's getting readings raning low 100's-120's/70's. She denies chest pain, dizziness, change in headaches.   BP Readings from Last 3 Encounters:  08/01/19 (!) 142/78  06/02/19 130/80  04/27/19 (!) 140/98     Review of Systems  Eyes: Negative for visual disturbance.  Respiratory: Negative for shortness of breath.   Cardiovascular: Negative for chest pain.  Neurological: Negative for dizziness.       Past Medical History:  Diagnosis Date  . Amblyopia   . ARTHRITIS, TRAUMATIC, ANKLE 08/07/2009   Trimalleolar fracture on right s/p fusion    . Benign essential tremor    worsened by anxiety  . Chronic pain due to injury    at R ankle due to ankle fracture  . Depression    followed by psych  . History of hypertension   . HLD (hyperlipidemia)   . Imbalance    likely multifactorial (s/p balance training at [facility] 08/2010) ?CMT  . Influenza A 09/15/2018  . Iron deficiency anemia   . LLL pneumonia (HCC) 10/05/2018  . Right ankle pain 2003   trimalleolar fx s/p fusion  . Seizure (HCC)    focal, ex vacuo ventriculomegaly?, pending neuro w/u with sleep deprived EEG  . Syncope and collapse 08/19/2011     Social History   Socioeconomic History  . Marital status: Divorced    Spouse name: Not on file  . Number of children: 1  . Years of education: Not on file  . Highest education level: Not on file  Occupational History  . Occupation: Disabled    Comment: depression  . Occupation: DISABILITY    Employer:  UNEMPLOYED  Tobacco Use  . Smoking status: Former Smoker    Packs/day: 1.00    Years: 20.00    Pack years: 20.00    Quit date: 08/12/1995    Years since quitting: 23.9  . Smokeless tobacco: Never Used  Substance and Sexual Activity  . Alcohol use: No    Alcohol/week: 0.0 standard drinks    Comment: None for > 1 year  . Drug use: No  . Sexual activity: Not on file  Other Topics Concern  . Not on file  Social History Narrative   No caffeine. Divorced. 1 child. 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, although endorses that she falls a lot    Social Determinants of Health   Financial Resource Strain:   . Difficulty of Paying Living Expenses: Not on file  Food Insecurity:   . Worried About Programme researcher, broadcasting/film/video in the Last Year: Not on file  . Ran Out of Food in the Last Year: Not on file  Transportation Needs:   . Lack of Transportation (Medical): Not on file  . Lack of Transportation (Non-Medical): Not on file  Physical Activity:   . Days of Exercise per Week: Not on file  . Minutes of Exercise per Session: Not on file  Stress:   .  Feeling of Stress : Not on file  Social Connections:   . Frequency of Communication with Friends and Family: Not on file  . Frequency of Social Gatherings with Friends and Family: Not on file  . Attends Religious Services: Not on file  . Active Member of Clubs or Organizations: Not on file  . Attends Archivist Meetings: Not on file  . Marital Status: Not on file  Intimate Partner Violence:   . Fear of Current or Ex-Partner: Not on file  . Emotionally Abused: Not on file  . Physically Abused: Not on file  . Sexually Abused: Not on file    Past Surgical History:  Procedure Laterality Date  . ANKLE FUSION  02/2004   Right-50% disability   . CARPAL TUNNEL RELEASE  12/2017   L CTR repair, R thumb A1 pulley release (Gramig)  . CESAREAN SECTION    . hospitalization  08/2011    syncope thought 2/2 seizure, MRI - Hyperintensity in the cerebral white matter bilaterally is nonspecific, started on Keppra  . ORIF ANKLE FRACTURE  06/2003   Right    Family History  Problem Relation Age of Onset  . Depression Mother   . Arrhythmia Mother 22       Dx with VTach after having sudden collapse, did NOT have h/o AMI's   . Atrial fibrillation Father   . Hypertension Father   . Osteoarthritis Father   . Aortic aneurysm Father   . Hepatitis Brother        Hep. C  . Anemia Sister     No Known Allergies  Current Outpatient Medications on File Prior to Visit  Medication Sig Dispense Refill  . acetaminophen (TYLENOL) 500 MG tablet Take 500 mg by mouth as needed.     . Ascorbic Acid (VITAMIN C) 1000 MG tablet Take 1,000 mg by mouth daily.    Marland Kitchen aspirin EC 81 MG tablet Take 81 mg by mouth every evening.      . budesonide-formoterol (SYMBICORT) 160-4.5 MCG/ACT inhaler Inhale 2 puffs into the lungs 2 (two) times daily. Rinse mouth after use (Patient taking differently: Inhale 2 puffs into the lungs 2 (two) times daily as needed. Rinse mouth after use) 1 Inhaler 0  . cycloSPORINE (RESTASIS) 0.05 % ophthalmic emulsion Place 1 drop into both eyes 2 (two) times daily.    . DULoxetine (CYMBALTA) 60 MG capsule Take 60 mg by mouth 2 (two) times daily.     Marland Kitchen escitalopram (LEXAPRO) 10 MG tablet Take 10 mg by mouth daily.     . ferrous sulfate 325 (65 FE) MG tablet Take 325 mg by mouth daily with breakfast.    . gabapentin (NEURONTIN) 300 MG capsule Take 300 mg by mouth. Take 1 capsule in the morning and 2 capsules in the evening.    . hydrochlorothiazide (HYDRODIURIL) 25 MG tablet Take 1 tablet (25 mg total) by mouth daily. For blood pressure. 90 tablet 3  . lamoTRIgine (LAMICTAL) 150 MG tablet TAKE 1 TABLET BY MOUTH TWICE A DAY 180 tablet 1  . lisinopril (ZESTRIL) 30 MG tablet TAKE 1 TABLET BY MOUTH ONCE A DAY FOR BLOOD PRESSURE 90 tablet 0  . MAGNESIUM PO Take 400 mg by mouth 3  (three) times daily.     . Melatonin 10 MG TABS Take 2 tablets by mouth at bedtime.     . montelukast (SINGULAIR) 10 MG tablet Take 1 tablet (10 mg total) by mouth at bedtime. (Patient taking differently: Take 10  mg by mouth at bedtime as needed. ) 30 tablet 3  . Multiple Vitamin (MULTIVITAMIN) tablet Take 1 tablet by mouth daily.    . naproxen sodium (ANAPROX) 220 MG tablet Take 440 mg by mouth as needed.     . primidone (MYSOLINE) 50 MG tablet TAKE 1 TABLET BY MOUTH AT BEDTIME 90 tablet 2  . propranolol (INDERAL) 40 MG tablet TAKE 1 TABLET BY MOUTH ONCE DAILY 90 tablet 2  . tiZANidine (ZANAFLEX) 2 MG tablet Take 2 mg by mouth at bedtime.    . vitamin B-12 (CYANOCOBALAMIN) 1000 MCG tablet Take 500 mcg by mouth daily.     Marland Kitchen. albuterol (VENTOLIN HFA) 108 (90 Base) MCG/ACT inhaler Inhale into the lungs every 6 (six) hours as needed.     Current Facility-Administered Medications on File Prior to Visit  Medication Dose Route Frequency Provider Last Rate Last Admin  . ipratropium-albuterol (DUONEB) 0.5-2.5 (3) MG/3ML nebulizer solution 3 mL  3 mL Nebulization Q6H Nche, Bonna Gainsharlotte Lum, NP   3 mL at 09/21/18 1547    BP (!) 142/78   Pulse 80   Temp (!) 97.5 F (36.4 C) (Temporal)   Ht 5\' 7"  (1.702 m)   Wt 238 lb 4 oz (108.1 kg)   LMP 06/01/2013   SpO2 98%   BMI 37.32 kg/m    Objective:   Physical Exam  Constitutional: She appears well-nourished.  Cardiovascular: Normal rate and regular rhythm.  Respiratory: Effort normal and breath sounds normal.  Musculoskeletal:     Cervical back: Neck supple.  Skin: Skin is warm and dry.  Psychiatric: She has a normal mood and affect.           Assessment & Plan:

## 2019-08-01 NOTE — Assessment & Plan Note (Signed)
Improved overall. Home readings are within desired range. Continue amlodipine 10 mg, lisinopril 30 mg, HCTZ 25 mg. Refill provided.

## 2019-08-08 DIAGNOSIS — I10 Essential (primary) hypertension: Secondary | ICD-10-CM

## 2019-08-08 MED ORDER — AMLODIPINE BESYLATE 10 MG PO TABS: 10.0000 mg | ORAL_TABLET | Freq: Every day | ORAL | 3 refills | Status: DC

## 2019-08-18 ENCOUNTER — Other Ambulatory Visit: Payer: Self-pay | Admitting: Neurology

## 2019-08-26 NOTE — Progress Notes (Signed)
Pt scheduled for vv today.  Called without response.  Link sent without response.

## 2019-08-29 ENCOUNTER — Other Ambulatory Visit: Payer: Self-pay

## 2019-08-29 ENCOUNTER — Telehealth (INDEPENDENT_AMBULATORY_CARE_PROVIDER_SITE_OTHER): Payer: Medicare PPO | Admitting: Neurology

## 2019-09-08 ENCOUNTER — Encounter: Payer: Self-pay | Admitting: Neurology

## 2019-09-09 NOTE — Progress Notes (Signed)
Virtual Visit via Video Note The purpose of this virtual visit is to provide medical care while limiting exposure to the novel coronavirus.    Consent was obtained for video visit:  Yes.   Answered questions that patient had about telehealth interaction:  Yes.   I discussed the limitations, risks, security and privacy concerns of performing an evaluation and management service by telemedicine. I also discussed with the patient that there may be a patient responsible charge related to this service. The patient expressed understanding and agreed to proceed.  Pt location: Home Physician Location: home Name of referring provider:  Doreene Nest, NP I connected with Jamie Patterson at patients initiation/request on 09/12/2019 at  8:45 AM EST by video enabled telemedicine application and verified that I am speaking with the correct person using two identifiers. Pt MRN:  [id number] Pt DOB:  March 08, 1961 Video Participants:  Jamie Patterson;     History of Present Illness:  Patient seen today in follow-up for tremor.  Medical records have been reviewed since last visit.  She now sees a Publishing rights manager for her primary care.  She has had some high blood pressure.  She is now on multiple blood pressure medications, including hydrochlorothiazide, lisinopril and amlodipine.  BP is good now.  Reports feet are very swollen from the amlodipine.  Having sx on feet for hammer toes tomorrow.    Current movement d/o meds: Primidone, 50 mg nightly Propranolol, 40 mg daily     Current Outpatient Medications on File Prior to Visit  Medication Sig Dispense Refill  . acetaminophen (TYLENOL) 500 MG tablet Take 500 mg by mouth as needed.     Marland Kitchen albuterol (VENTOLIN HFA) 108 (90 Base) MCG/ACT inhaler Inhale into the lungs every 6 (six) hours as needed.    Marland Kitchen amLODipine (NORVASC) 10 MG tablet Take 1 tablet (10 mg total) by mouth daily. Blood pressure. 90 tablet 3  . Ascorbic Acid (VITAMIN C) 1000 MG tablet Take 1,000 mg  by mouth daily.    Marland Kitchen aspirin EC 81 MG tablet Take 81 mg by mouth every evening.      . budesonide-formoterol (SYMBICORT) 160-4.5 MCG/ACT inhaler Inhale 2 puffs into the lungs 2 (two) times daily. Rinse mouth after use (Patient taking differently: Inhale 2 puffs into the lungs 2 (two) times daily as needed. Rinse mouth after use) 1 Inhaler 0  . cycloSPORINE (RESTASIS) 0.05 % ophthalmic emulsion Place 1 drop into both eyes 2 (two) times daily.    . DULoxetine (CYMBALTA) 60 MG capsule Take 60 mg by mouth 2 (two) times daily.     Marland Kitchen escitalopram (LEXAPRO) 10 MG tablet Take 10 mg by mouth daily.     . ferrous sulfate 325 (65 FE) MG tablet Take 325 mg by mouth daily with breakfast.    . gabapentin (NEURONTIN) 300 MG capsule Take 300 mg by mouth. Take 1 capsule in the morning and 2 capsules in the evening.    . hydrochlorothiazide (HYDRODIURIL) 25 MG tablet Take 1 tablet (25 mg total) by mouth daily. For blood pressure. 90 tablet 3  . lamoTRIgine (LAMICTAL) 150 MG tablet TAKE 1 TABLET BY MOUTH TWICE A DAY 180 tablet 1  . lisinopril (ZESTRIL) 30 MG tablet TAKE 1 TABLET BY MOUTH ONCE A DAY FOR BLOOD PRESSURE 90 tablet 0  . MAGNESIUM PO Take 400 mg by mouth 3 (three) times daily.     . Melatonin 10 MG TABS Take 2 tablets by mouth at bedtime.     Marland Kitchen  montelukast (SINGULAIR) 10 MG tablet Take 1 tablet (10 mg total) by mouth at bedtime. (Patient taking differently: Take 10 mg by mouth at bedtime as needed. ) 30 tablet 3  . Multiple Vitamin (MULTIVITAMIN) tablet Take 1 tablet by mouth daily.    . naproxen sodium (ANAPROX) 220 MG tablet Take 440 mg by mouth as needed.     . primidone (MYSOLINE) 50 MG tablet TAKE ONE TABLET BY MOUTH AT BEDTIME 90 tablet 2  . propranolol (INDERAL) 40 MG tablet TAKE 1 TABLET BY MOUTH ONCE DAILY 90 tablet 2  . tiZANidine (ZANAFLEX) 2 MG tablet Take 2 mg by mouth at bedtime.    . vitamin B-12 (CYANOCOBALAMIN) 1000 MCG tablet Take 500 mcg by mouth daily.      Current  Facility-Administered Medications on File Prior to Visit  Medication Dose Route Frequency Provider Last Rate Last Admin  . ipratropium-albuterol (DUONEB) 0.5-2.5 (3) MG/3ML nebulizer solution 3 mL  3 mL Nebulization Q6H Nche, Charlene Brooke, NP   3 mL at 09/21/18 1547     Observations/Objective:   There were no vitals filed for this visit. GEN:  The patient appears stated age and is in NAD.  Neurological examination:  Orientation: The patient is alert and oriented x3. Cranial nerves: There is good facial symmetry. There is no facial hypomimia.  The speech is fluent and clear. Soft palate rises symmetrically and there is no tongue deviation. Hearing is intact to conversational tone. Motor: Strength is at least antigravity x 4.   Shoulder shrug is equal and symmetric.  There is no pronator drift.  Movement examination: Tone: unable Abnormal movements: no significant postural or intention tremor Coordination:  There is no decremation with RAM's Gait and Station: not tested  I have reviewed and interpreted the following labs independently   Chemistry      Component Value Date/Time   NA 134 (L) 04/27/2019 1049   K 4.1 04/27/2019 1049   CL 98 04/27/2019 1049   CO2 30 04/27/2019 1049   BUN 9 04/27/2019 1049   CREATININE 0.71 04/27/2019 1049   CREATININE 0.67 02/16/2018 1332      Component Value Date/Time   CALCIUM 9.7 04/27/2019 1049   ALKPHOS 94 04/27/2019 1049   AST 27 04/27/2019 1049   ALT 25 04/27/2019 1049   BILITOT 0.4 04/27/2019 1049     Lab Results  Component Value Date   WBC 8.9 09/30/2018   HGB 14.7 09/30/2018   HCT 42.8 09/30/2018   MCV 96.7 09/30/2018   PLT 528.0 (H) 09/30/2018     Assessment and Plan:   1.  Essential tremor, exacerbated by anxiety/depression  -Continue primidone, 50 mg nightly  -Continue propranolol, 40 mg.  Did not want to increase this because of history of depression 2.  Chronic migraine/chronic daily headache 3.  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 Toy Care  -has stopped counseling with Dr. Henrene Pastor because doesn't want to do it virtually and is only way [facility] is doing it 4.  Hypertension  -Patient on multiple blood pressure medications from primary care, including amlodipine, lisinopril, hydrochlorothiazide.  This is in addition to the propranolol I have her on.  Follow Up Instructions:  9 months  -I discussed the assessment and treatment plan with the patient. The patient was provided an opportunity to ask questions and all were answered. The patient agreed with the plan and demonstrated an understanding  of the instructions.   The patient was advised to call back or seek an in-person evaluation if the symptoms worsen or if the condition fails to improve as anticipated.    Total time spent on today's visit was 20 minutes, including both face-to-face time and nonface-to-face time.  Time included that spent on review of records (prior notes available to me/labs/imaging if pertinent), discussing treatment and goals, answering patient's questions and coordinating care.   Kerin Salen, DO

## 2019-09-12 ENCOUNTER — Other Ambulatory Visit: Payer: Self-pay

## 2019-09-12 ENCOUNTER — Telehealth (INDEPENDENT_AMBULATORY_CARE_PROVIDER_SITE_OTHER): Payer: Medicare PPO | Admitting: Neurology

## 2019-09-12 DIAGNOSIS — R251 Tremor, unspecified: Secondary | ICD-10-CM

## 2019-09-12 DIAGNOSIS — F331 Major depressive disorder, recurrent, moderate: Secondary | ICD-10-CM | POA: Diagnosis not present

## 2019-09-12 HISTORY — PX: HAMMER TOE SURGERY: SHX385

## 2019-10-13 ENCOUNTER — Other Ambulatory Visit: Payer: Self-pay | Admitting: Primary Care

## 2019-10-13 DIAGNOSIS — I10 Essential (primary) hypertension: Secondary | ICD-10-CM

## 2019-12-01 ENCOUNTER — Ambulatory Visit (INDEPENDENT_AMBULATORY_CARE_PROVIDER_SITE_OTHER): Payer: Medicare PPO

## 2019-12-01 VITALS — BP 123/86

## 2019-12-01 DIAGNOSIS — Z0000 Encounter for general adult medical examination without abnormal findings: Secondary | ICD-10-CM | POA: Diagnosis not present

## 2019-12-01 NOTE — Progress Notes (Signed)
PCP notes:  Health Maintenance: Colonoscopy- will do Cologuard Hep C- due   Abnormal Screenings: none   Patient concerns: Bilateral foot edema since starting on amlodipine   Nurse concerns: none   Next PCP appt.: none

## 2019-12-01 NOTE — Patient Instructions (Signed)
Jamie Patterson , Thank you for taking time to come for your Medicare Wellness Visit. I appreciate your ongoing commitment to your health goals. Please review the following plan we discussed and let me know if I can assist you in the future.   Screening recommendations/referrals: Colonoscopy: will do Cologuard Mammogram: Up to date, completed 03/12/2019 Bone Density: age 59 Recommended yearly ophthalmology/optometry visit for glaucoma screening and checkup Recommended yearly dental visit for hygiene and checkup  Vaccinations: Influenza vaccine: Up to date, completed 04/07/2019 Pneumococcal vaccine: Up to date, completed 05/04/2015 Tdap vaccine: Up to date, completed 08/28/2011 Shingles vaccine: discussed    Advanced directives: Advance directive discussed with you today. Even though you declined this today please call our office should you change your mind and we can give you the proper paperwork for you to fill out.  Conditions/risks identified: hypertension, hypercholesterolemia  Next appointment: none  Preventive Care 40-64 Years, Female Preventive care refers to lifestyle choices and visits with your health care provider that can promote health and wellness. What does preventive care include?  A yearly physical exam. This is also called an annual well check.  Dental exams once or twice a year.  Routine eye exams. Ask your health care provider how often you should have your eyes checked.  Personal lifestyle choices, including:  Daily care of your teeth and gums.  Regular physical activity.  Eating a healthy diet.  Avoiding tobacco and drug use.  Limiting alcohol use.  Practicing safe sex.  Taking low-dose aspirin daily starting at age 22.  Taking vitamin and mineral supplements as recommended by your health care provider. What happens during an annual well check? The services and screenings done by your health care provider during your annual well check will depend on your  age, overall health, lifestyle risk factors, and family history of disease. Counseling  Your health care provider may ask you questions about your:  Alcohol use.  Tobacco use.  Drug use.  Emotional well-being.  Home and relationship well-being.  Sexual activity.  Eating habits.  Work and work Statistician.  Method of birth control.  Menstrual cycle.  Pregnancy history. Screening  You may have the following tests or measurements:  Height, weight, and BMI.  Blood pressure.  Lipid and cholesterol levels. These may be checked every 5 years, or more frequently if you are over 60 years old.  Skin check.  Lung cancer screening. You may have this screening every year starting at age 69 if you have a 30-pack-year history of smoking and currently smoke or have quit within the past 15 years.  Fecal occult blood test (FOBT) of the stool. You may have this test every year starting at age 41.  Flexible sigmoidoscopy or colonoscopy. You may have a sigmoidoscopy every 5 years or a colonoscopy every 10 years starting at age 59.  Hepatitis C blood test.  Hepatitis B blood test.  Sexually transmitted disease (STD) testing.  Diabetes screening. This is done by checking your blood sugar (glucose) after you have not eaten for a while (fasting). You may have this done every 1-3 years.  Mammogram. This may be done every 1-2 years. Talk to your health care provider about when you should start having regular mammograms. This may depend on whether you have a family history of breast cancer.  BRCA-related cancer screening. This may be done if you have a family history of breast, ovarian, tubal, or peritoneal cancers.  Pelvic exam and Pap test. This may be done every  3 years starting at age 57. Starting at age 2, this may be done every 5 years if you have a Pap test in combination with an HPV test.  Bone density scan. This is done to screen for osteoporosis. You may have this scan if you  are at high risk for osteoporosis. Discuss your test results, treatment options, and if necessary, the need for more tests with your health care provider. Vaccines  Your health care provider may recommend certain vaccines, such as:  Influenza vaccine. This is recommended every year.  Tetanus, diphtheria, and acellular pertussis (Tdap, Td) vaccine. You may need a Td booster every 10 years.  Zoster vaccine. You may need this after age 49.  Pneumococcal 13-valent conjugate (PCV13) vaccine. You may need this if you have certain conditions and were not previously vaccinated.  Pneumococcal polysaccharide (PPSV23) vaccine. You may need one or two doses if you smoke cigarettes or if you have certain conditions. Talk to your health care provider about which screenings and vaccines you need and how often you need them. This information is not intended to replace advice given to you by your health care provider. Make sure you discuss any questions you have with your health care provider. Document Released: 08/24/2015 Document Revised: 04/16/2016 Document Reviewed: 05/29/2015 Elsevier Interactive Patient Education  2017 [place] Prevention in the Home Falls can cause injuries. They can happen to people of all ages. There are many things you can do to make your home safe and to help prevent falls. What can I do on the outside of my home?  Regularly fix the edges of walkways and driveways and fix any cracks.  Remove anything that might make you trip as you walk through a door, such as a raised step or threshold.  Trim any bushes or trees on the path to your home.  Use bright outdoor lighting.  Clear any walking paths of anything that might make someone trip, such as rocks or tools.  Regularly check to see if handrails are loose or broken. Make sure that both sides of any steps have handrails.  Any raised decks and porches should have guardrails on the edges.  Have any leaves,  snow, or ice cleared regularly.  Use sand or salt on walking paths during winter.  Clean up any spills in your garage right away. This includes oil or grease spills. What can I do in the bathroom?  Use night lights.  Install grab bars by the toilet and in the tub and shower. Do not use towel bars as grab bars.  Use non-skid mats or decals in the tub or shower.  If you need to sit down in the shower, use a plastic, non-slip stool.  Keep the floor dry. Clean up any water that spills on the floor as soon as it happens.  Remove soap buildup in the tub or shower regularly.  Attach bath mats securely with double-sided non-slip rug tape.  Do not have throw rugs and other things on the floor that can make you trip. What can I do in the bedroom?  Use night lights.  Make sure that you have a light by your bed that is easy to reach.  Do not use any sheets or blankets that are too big for your bed. They should not hang down onto the floor.  Have a firm chair that has side arms. You can use this for support while you get dressed.  Do not have throw  rugs and other things on the floor that can make you trip. What can I do in the kitchen?  Clean up any spills right away.  Avoid walking on wet floors.  Keep items that you use a lot in easy-to-reach places.  If you need to reach something above you, use a strong step stool that has a grab bar.  Keep electrical cords out of the way.  Do not use floor polish or wax that makes floors slippery. If you must use wax, use non-skid floor wax.  Do not have throw rugs and other things on the floor that can make you trip. What can I do with my stairs?  Do not leave any items on the stairs.  Make sure that there are handrails on both sides of the stairs and use them. Fix handrails that are broken or loose. Make sure that handrails are as long as the stairways.  Check any carpeting to make sure that it is firmly attached to the stairs. Fix any  carpet that is loose or worn.  Avoid having throw rugs at the top or bottom of the stairs. If you do have throw rugs, attach them to the floor with carpet tape.  Make sure that you have a light switch at the top of the stairs and the bottom of the stairs. If you do not have them, ask someone to add them for you. What else can I do to help prevent falls?  Wear shoes that:  Do not have high heels.  Have rubber bottoms.  Are comfortable and fit you well.  Are closed at the toe. Do not wear sandals.  If you use a stepladder:  Make sure that it is fully opened. Do not climb a closed stepladder.  Make sure that both sides of the stepladder are locked into place.  Ask someone to hold it for you, if possible.  Clearly mark and make sure that you can see:  Any grab bars or handrails.  First and last steps.  Where the edge of each step is.  Use tools that help you move around (mobility aids) if they are needed. These include:  Canes.  Walkers.  Scooters.  Crutches.  Turn on the lights when you go into a dark area. Replace any light bulbs as soon as they burn out.  Set up your furniture so you have a clear path. Avoid moving your furniture around.  If any of your floors are uneven, fix them.  If there are any pets around you, be aware of where they are.  Review your medicines with your doctor. Some medicines can make you feel dizzy. This can increase your chance of falling. Ask your doctor what other things that you can do to help prevent falls. This information is not intended to replace advice given to you by your health care provider. Make sure you discuss any questions you have with your health care provider. Document Released: 05/24/2009 Document Revised: 01/03/2016 Document Reviewed: 09/01/2014 Elsevier Interactive Patient Education  2017 [employer].

## 2019-12-01 NOTE — Progress Notes (Signed)
Subjective:   Jamie Patterson is a 59 y.o. female who presents for an Initial Medicare Annual Wellness Visit.  Review of Systems: N/A      This visit is being conducted through telemedicine via telephone at the nurse health advisor's home address due to the COVID-19 pandemic. This patient has given me verbal consent via doximity to conduct this visit, patient states they are participating from their home address. Patient and myself are on the telephone call. There is no referral for this visit. Some vital signs may be absent or patient reported.    Patient identification: identified by name, DOB, and current address    Cardiac Risk Factors include: advanced age (>68men, >16 women);hypertension;Other (see comment), Risk factor comments: hypercholesterolemia     Objective:    Today's Vitals   12/01/19 1025 12/01/19 1033  BP: 123/86   PainSc:  5    There is no height or weight on file to calculate BMI.  Advanced Directives 12/01/2019 11/25/2018 08/19/2011  Does Patient Have a Medical Advance Directive? No No Patient does not have advance directive  Would patient like information on creating a medical advance directive? No - Patient declined - -    Current Medications (verified) Outpatient Encounter Medications as of 12/01/2019  Medication Sig  . acetaminophen (TYLENOL) 500 MG tablet Take 500 mg by mouth as needed.   Marland Kitchen albuterol (VENTOLIN HFA) 108 (90 Base) MCG/ACT inhaler Inhale into the lungs every 6 (six) hours as needed.  Marland Kitchen amLODipine (NORVASC) 10 MG tablet Take 1 tablet (10 mg total) by mouth daily. Blood pressure.  . Ascorbic Acid (VITAMIN C) 1000 MG tablet Take 1,000 mg by mouth daily.  Marland Kitchen aspirin EC 81 MG tablet Take 81 mg by mouth every evening.    . budesonide-formoterol (SYMBICORT) 160-4.5 MCG/ACT inhaler Inhale 2 puffs into the lungs 2 (two) times daily. Rinse mouth after use (Patient taking differently: Inhale 2 puffs into the lungs 2 (two) times daily as needed. Rinse mouth  after use)  . cycloSPORINE (RESTASIS) 0.05 % ophthalmic emulsion Place 1 drop into both eyes 2 (two) times daily.  . DULoxetine (CYMBALTA) 60 MG capsule Take 60 mg by mouth 2 (two) times daily.   Marland Kitchen escitalopram (LEXAPRO) 10 MG tablet Take 10 mg by mouth daily.   . ferrous sulfate 325 (65 FE) MG tablet Take 325 mg by mouth daily with breakfast.  . gabapentin (NEURONTIN) 300 MG capsule Take 300 mg by mouth. Take 1 capsule in the morning and 2 capsules in the evening.  . hydrochlorothiazide (HYDRODIURIL) 25 MG tablet Take 1 tablet (25 mg total) by mouth daily. For blood pressure.  . lamoTRIgine (LAMICTAL) 150 MG tablet TAKE 1 TABLET BY MOUTH TWICE A DAY  . lisinopril (ZESTRIL) 30 MG tablet Take 1 tablet (30 mg total) by mouth daily. SCHEDULE PHYSICAL June 2021  . MAGNESIUM PO Take 400 mg by mouth 3 (three) times daily.   . Melatonin 10 MG TABS Take 2 tablets by mouth at bedtime.   . montelukast (SINGULAIR) 10 MG tablet Take 1 tablet (10 mg total) by mouth at bedtime. (Patient taking differently: Take 10 mg by mouth at bedtime as needed. )  . Multiple Vitamin (MULTIVITAMIN) tablet Take 1 tablet by mouth daily.  . naproxen sodium (ANAPROX) 220 MG tablet Take 440 mg by mouth as needed.   . primidone (MYSOLINE) 50 MG tablet TAKE ONE TABLET BY MOUTH AT BEDTIME  . propranolol (INDERAL) 40 MG tablet TAKE 1 TABLET BY  MOUTH ONCE DAILY  . tiZANidine (ZANAFLEX) 2 MG tablet Take 2 mg by mouth at bedtime.  . vitamin B-12 (CYANOCOBALAMIN) 1000 MCG tablet Take 500 mcg by mouth daily.    Facility-Administered Encounter Medications as of 12/01/2019  Medication  . ipratropium-albuterol (DUONEB) 0.5-2.5 (3) MG/3ML nebulizer solution 3 mL    Allergies (verified) Patient has no known allergies.   History: Past Medical History:  Diagnosis Date  . Amblyopia   . ARTHRITIS, TRAUMATIC, ANKLE 08/07/2009   Trimalleolar fracture on right s/p fusion    . Benign essential tremor    worsened by anxiety  . Chronic  pain due to injury    at R ankle due to ankle fracture  . Depression    followed by psych  . History of hypertension   . HLD (hyperlipidemia)   . Imbalance    likely multifactorial (s/p balance training at [facility] 08/2010) ?CMT  . Influenza A 09/15/2018  . Iron deficiency anemia   . LLL pneumonia 10/05/2018  . Right ankle pain 2003   trimalleolar fx s/p fusion  . Seizure (HCC)    focal, ex vacuo ventriculomegaly?, pending neuro w/u with sleep deprived EEG  . Syncope and collapse 08/19/2011   Past Surgical History:  Procedure Laterality Date  . ANKLE FUSION  02/2004   Right-50% disability   . CARPAL TUNNEL RELEASE  12/2017   L CTR repair, R thumb A1 pulley release (Gramig)  . CESAREAN SECTION    . HAMMER TOE SURGERY  09/12/2019  . hospitalization  08/2011   syncope thought 2/2 seizure, MRI - Hyperintensity in the cerebral white matter bilaterally is nonspecific, started on Keppra  . ORIF ANKLE FRACTURE  06/2003   Right   Family History  Problem Relation Age of Onset  . Depression Mother   . Arrhythmia Mother 42       Dx with VTach after having sudden collapse, did NOT have h/o AMI's   . Atrial fibrillation Father   . Hypertension Father   . Osteoarthritis Father   . Aortic aneurysm Father   . Hepatitis Brother        Hep. C  . Anemia Sister    Social History   Socioeconomic History  . Marital status: Divorced    Spouse name: Not on file  . Number of children: 1  . Years of education: Not on file  . Highest education level: Not on file  Occupational History  . Occupation: Disabled    Comment: depression  . Occupation: DISABILITY    Employer: UNEMPLOYED  Tobacco Use  . Smoking status: Former Smoker    Packs/day: 1.00    Years: 20.00    Pack years: 20.00    Quit date: 08/12/1995    Years since quitting: 24.3  . Smokeless tobacco: Never Used  Substance and Sexual Activity  . Alcohol use: No    Alcohol/week: 0.0 standard drinks    Comment: None for > 1 year  . Drug  use: No  . Sexual activity: Not on file  Other Topics Concern  . Not on file  Social History Narrative   No caffeine. Divorced. 1 child. 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 Difficulty of Paying Living Expenses: Not hard at all  Food Insecurity: No Food Insecurity  . Worried About  Running Out of Food in the Last Year: Never true  . Ran Out of Food in the Last Year: Never true  Transportation Needs: No Transportation Needs  . Lack of Transportation (Medical): No  . Lack of Transportation (Non-Medical): No  Physical Activity: Inactive  . Days of Exercise per Week: 0 days  . Minutes of Exercise per Session: 0 min  Stress: No Stress Concern Present  . Feeling of Stress : Not at all  Social Connections:   . Frequency of Communication with Friends and Family:   . Frequency of Social Gatherings with Friends and Family:   . Attends Religious Services:   . Active Member of Clubs or Organizations:   . Attends Banker Meetings:   Marland Kitchen Marital Status:     Tobacco Counseling Counseling given: Not Answered   Clinical Intake:  Pre-visit preparation completed: Yes  Pain : 0-10 Pain Score: 5  Pain Type: Chronic pain Pain Location: Back Pain Descriptors / Indicators: Aching Pain Onset: More than a month ago Pain Frequency: Intermittent     Nutritional Risks: None  How often do you need to have someone help you when you read instructions, pamphlets, or other written materials from your doctor or pharmacy?: 1 - Never What is the last grade level you completed in school?: some college  Interpreter Needed?: No  Information entered by :: CJohnson, LPN   Activities of Daily Living In your present state of health, do you have any difficulty performing the following activities: 12/01/2019    Hearing? N  Vision? Y  Difficulty concentrating or making decisions? N  Walking or climbing stairs? N  Dressing or bathing? N  Doing errands, shopping? N  Preparing Food and eating ? N  Using the Toilet? N  In the past six months, have you accidently leaked urine? N  Do you have problems with loss of bowel control? N  Managing your Medications? N  Managing your Finances? N  Housekeeping or managing your Housekeeping? N  Some recent data might be hidden     Immunizations and Health Maintenance Immunization History  Administered Date(s) Administered  . Influenza Split 08/19/2012  . Influenza,inj,Quad PF,6+ Mos 05/04/2015, 09/04/2018, 04/07/2019  . Pneumococcal Conjugate-13 05/04/2015  . Tdap 08/28/2011   Health Maintenance Due  Topic Date Due  . Hepatitis C Screening  Never done  . HIV Screening  Never done  . COVID-19 Vaccine (1) Never done  . COLONOSCOPY  Never done    Patient Care Team: Doreene Nest, NP as PCP - General (Internal Medicine) [name], MD Tat, Octaviano Batty, DO as Consulting Physician (Neurology)  Indicate any recent Medical Services you may have received from other than [facility] providers in the past year (date may be approximate).     Assessment:   This is a routine wellness examination for Riki.  Hearing/Vision screen  Hearing Screening   125Hz  250Hz  500Hz  1000Hz  2000Hz  3000Hz  4000Hz  6000Hz  8000Hz   Right ear:           Left ear:           Vision Screening Comments: Patient gets annual eye exams.  Dietary issues and exercise activities discussed: Current Exercise Habits: The patient does not participate in regular exercise at present, Exercise limited by: None identified  Goals    . Patient Stated     12/01/2019, I will maintain and continue medications as prescribed.       Depression Screen PHQ 2/9 Scores 12/01/2019  PHQ - 2  Score 6  PHQ- 9 Score 6    Fall Risk Fall Risk  12/01/2019 09/08/2019 11/25/2018 02/16/2018 08/18/2017  Falls  in the past year? 1 1 0 Yes No  Number falls in past yr: 1 1 0 1 -  Injury with Fall? 0 0 0 No -  Risk Factor Category  - - - - -  Risk for fall due to : History of fall(s) - - - -  Follow up Falls evaluation completed;Falls prevention discussed - Falls evaluation completed Falls evaluation completed -    Is the patient's home free of loose throw rugs in walkways, pet beds, electrical cords, etc?   yes      Grab bars in the bathroom? no      Handrails on the stairs?   yes      Adequate lighting?   yes  Timed Get Up and Go Performed: N/A  Cognitive Function: MMSE - Mini Mental State Exam 12/01/2019  Orientation to time 5  Orientation to Place 5  Registration 3  Attention/ Calculation 5  Recall 3  Language- repeat 1       Mini Cog  Mini-Cog screen was completed. Maximum score is 22. A value of 0 denotes this part of the MMSE was not completed or the patient failed this part of the Mini-Cog screening.  Screening Tests Health Maintenance  Topic Date Due  . Hepatitis C Screening  Never done  . HIV Screening  Never done  . COVID-19 Vaccine (1) Never done  . COLONOSCOPY  Never done  . INFLUENZA VACCINE  03/11/2020  . MAMMOGRAM  03/11/2021  . TETANUS/TDAP  08/27/2021  . PAP SMEAR-Modifier  03/11/2022    Qualifies for Shingles Vaccine: N/A  Cancer Screenings: Lung: Low Dose CT Chest recommended if Age 18-80 years, 30 pack-year currently smoking OR have quit w/in 15 years. Patient does not qualify. Breast: Up to date on Mammogram: Yes, completed 03/12/2019 per patient   Up to date of Bone Density/Dexa: starts at age 16 Colorectal: will do Cologuard   Additional Screenings:  Hepatitis C Screening: due     Plan:    Patient will maintain and continue medications as prescribed.   I have personally reviewed and noted the following in the patient's chart:   . Medical and social history . Use of alcohol, tobacco or illicit drugs  . Current medications and  supplements . Functional ability and status . Nutritional status . Physical activity . Advanced directives . List of other physicians . Hospitalizations, surgeries, and ER visits in previous 12 months . Vitals . Screenings to include cognitive, depression, and falls . Referrals and appointments  In addition, I have reviewed and discussed with patient certain preventive protocols, quality metrics, and best practice recommendations. A written personalized care plan for preventive services as well as general preventive health recommendations were provided to patient.     Janalyn Shy, LPN   0/51/1021

## 2020-01-31 ENCOUNTER — Other Ambulatory Visit: Payer: Self-pay

## 2020-01-31 ENCOUNTER — Ambulatory Visit (INDEPENDENT_AMBULATORY_CARE_PROVIDER_SITE_OTHER): Payer: Medicare PPO | Admitting: Primary Care

## 2020-01-31 ENCOUNTER — Encounter: Payer: Self-pay | Admitting: Primary Care

## 2020-01-31 ENCOUNTER — Other Ambulatory Visit: Payer: Self-pay | Admitting: Primary Care

## 2020-01-31 VITALS — BP 126/80 | HR 114 | Temp 96.1°F | Ht 67.0 in | Wt 220.2 lb

## 2020-01-31 DIAGNOSIS — E7800 Pure hypercholesterolemia, unspecified: Secondary | ICD-10-CM | POA: Diagnosis not present

## 2020-01-31 DIAGNOSIS — I10 Essential (primary) hypertension: Secondary | ICD-10-CM

## 2020-01-31 DIAGNOSIS — Z0000 Encounter for general adult medical examination without abnormal findings: Secondary | ICD-10-CM

## 2020-01-31 DIAGNOSIS — G5600 Carpal tunnel syndrome, unspecified upper limb: Secondary | ICD-10-CM

## 2020-01-31 DIAGNOSIS — R05 Cough: Secondary | ICD-10-CM

## 2020-01-31 DIAGNOSIS — R251 Tremor, unspecified: Secondary | ICD-10-CM

## 2020-01-31 DIAGNOSIS — G40909 Epilepsy, unspecified, not intractable, without status epilepticus: Secondary | ICD-10-CM | POA: Diagnosis not present

## 2020-01-31 DIAGNOSIS — G8929 Other chronic pain: Secondary | ICD-10-CM | POA: Insufficient documentation

## 2020-01-31 DIAGNOSIS — E538 Deficiency of other specified B group vitamins: Secondary | ICD-10-CM

## 2020-01-31 DIAGNOSIS — K5900 Constipation, unspecified: Secondary | ICD-10-CM

## 2020-01-31 DIAGNOSIS — Z23 Encounter for immunization: Secondary | ICD-10-CM

## 2020-01-31 DIAGNOSIS — M545 Low back pain, unspecified: Secondary | ICD-10-CM

## 2020-01-31 DIAGNOSIS — F331 Major depressive disorder, recurrent, moderate: Secondary | ICD-10-CM

## 2020-01-31 DIAGNOSIS — R053 Chronic cough: Secondary | ICD-10-CM

## 2020-01-31 DIAGNOSIS — Z1159 Encounter for screening for other viral diseases: Secondary | ICD-10-CM

## 2020-01-31 DIAGNOSIS — D509 Iron deficiency anemia, unspecified: Secondary | ICD-10-CM

## 2020-01-31 DIAGNOSIS — Z1211 Encounter for screening for malignant neoplasm of colon: Secondary | ICD-10-CM

## 2020-01-31 DIAGNOSIS — R5383 Other fatigue: Secondary | ICD-10-CM | POA: Diagnosis not present

## 2020-01-31 DIAGNOSIS — Z114 Encounter for screening for human immunodeficiency virus [HIV]: Secondary | ICD-10-CM

## 2020-01-31 LAB — LIPID PANEL
Cholesterol: 256 mg/dL — ABNORMAL HIGH (ref 0–200)
HDL: 43.8 mg/dL (ref >39.00)
LDL Cholesterol: 186 mg/dL — ABNORMAL HIGH (ref 0–99)
NonHDL: 212.49
Total CHOL/HDL Ratio: 6
Triglycerides: 134 mg/dL (ref 0.0–149.0)
VLDL: 26.8 mg/dL (ref 0.0–40.0)

## 2020-01-31 LAB — IBC + FERRITIN
Ferritin: 270.6 ng/mL (ref 10.0–291.0)
Iron: 72 ug/dL (ref 42–145)
Saturation Ratios: 26.4 % (ref 20.0–50.0)
Transferrin: 195 mg/dL — ABNORMAL LOW (ref 212.0–360.0)

## 2020-01-31 LAB — COMPREHENSIVE METABOLIC PANEL
ALT: 24 U/L (ref 0–35)
AST: 22 U/L (ref 0–37)
Albumin: 4.2 g/dL (ref 3.5–5.2)
Alkaline Phosphatase: 168 U/L — ABNORMAL HIGH (ref 39–117)
BUN: 11 mg/dL (ref 6–23)
CO2: 28 mEq/L (ref 19–32)
Calcium: 10.1 mg/dL (ref 8.4–10.5)
Chloride: 98 mEq/L (ref 96–112)
Creatinine, Ser: 0.78 mg/dL (ref 0.40–1.20)
GFR: 75.82 mL/min (ref >60.00)
Glucose, Bld: 128 mg/dL — ABNORMAL HIGH (ref 70–99)
Potassium: 5 mEq/L (ref 3.5–5.1)
Sodium: 136 mEq/L (ref 135–145)
Total Bilirubin: 0.3 mg/dL (ref 0.2–1.2)
Total Protein: 7.3 g/dL (ref 6.0–8.3)

## 2020-01-31 LAB — CBC
HCT: 40 % (ref 36.0–46.0)
Hemoglobin: 13.8 g/dL (ref 12.0–15.0)
MCHC: 34.5 g/dL (ref 30.0–36.0)
MCV: 96.6 fl (ref 78.0–100.0)
Platelets: 608 10*3/uL — ABNORMAL HIGH (ref 150.0–400.0)
RBC: 4.14 Mil/uL (ref 3.87–5.11)
RDW: 12.3 % (ref 11.5–15.5)
WBC: 11.5 10*3/uL — ABNORMAL HIGH (ref 4.0–10.5)

## 2020-01-31 LAB — TSH: TSH: 12.91 u[IU]/mL — ABNORMAL HIGH (ref 0.35–4.50)

## 2020-01-31 LAB — VITAMIN B12: Vitamin B-12: 556 pg/mL (ref 211–911)

## 2020-01-31 LAB — HEMOGLOBIN A1C: Hgb A1c MFr Bld: 6 % (ref 4.6–6.5)

## 2020-01-31 MED ORDER — ZOSTER VAC RECOMB ADJUVANTED 50 MCG/0.5ML IM SUSR: 0.5000 mL | Freq: Once | INTRAMUSCULAR | 1 refills | Status: AC

## 2020-01-31 MED ORDER — MONTELUKAST SODIUM 10 MG PO TABS: 10.0000 mg | ORAL_TABLET | Freq: Every day | ORAL | 0 refills | Status: DC

## 2020-01-31 NOTE — Assessment & Plan Note (Signed)
Chronic for years, bilateral lumbar spine. She would like to consider physical therapy but declines today. She will update if she changes her mind.

## 2020-01-31 NOTE — Assessment & Plan Note (Signed)
Compliant to oral iron several times weekly. Continue same. CBC pending.

## 2020-01-31 NOTE — Patient Instructions (Addendum)
Stop by the lab prior to leaving today. I will notify you of your results once received.   Follow up with GYN for your pap smear and mammogram.  Complete the cologuard kit once received.  Take the Shingles vaccine to the pharmacy.  Start Singulair once nightly for cough. Please update me in 2 weeks.  Start exercising. You should be getting 150 minutes of exercise weekly.  It's important to improve your diet by reducing consumption of fast food, fried food, processed snack foods, sugary drinks. Increase consumption of fresh vegetables and fruits, whole grains, water.  Ensure you are drinking 64 ounces of water daily.  It was a pleasure to see you today!   Preventive Care 58-20 Years Old, Female Preventive care refers to visits with your health care provider and lifestyle choices that can promote health and wellness. This includes:  A yearly physical exam. This may also be called an annual well check.  Regular dental visits and eye exams.  Immunizations.  Screening for certain conditions.  Healthy lifestyle choices, such as eating a healthy diet, getting regular exercise, not using drugs or products that contain nicotine and tobacco, and limiting alcohol use. What can I expect for my preventive care visit? Physical exam Your health care provider will check your:  Height and weight. This may be used to calculate body mass index (BMI), which tells if you are at a healthy weight.  Heart rate and blood pressure.  Skin for abnormal spots. Counseling Your health care provider may ask you questions about your:  Alcohol, tobacco, and drug use.  Emotional well-being.  Home and relationship well-being.  Sexual activity.  Eating habits.  Work and work Statistician.  Method of birth control.  Menstrual cycle.  Pregnancy history. What immunizations do I need?  Influenza (flu) vaccine  This is recommended every year. Tetanus, diphtheria, and pertussis (Tdap)  vaccine  You may need a Td booster every 10 years. Varicella (chickenpox) vaccine  You may need this if you have not been vaccinated. Zoster (shingles) vaccine  You may need this after age 58. Measles, mumps, and rubella (MMR) vaccine  You may need at least one dose of MMR if you were born in 1957 or later. You may also need a second dose. Pneumococcal conjugate (PCV13) vaccine  You may need this if you have certain conditions and were not previously vaccinated. Pneumococcal polysaccharide (PPSV23) vaccine  You may need one or two doses if you smoke cigarettes or if you have certain conditions. Meningococcal conjugate (MenACWY) vaccine  You may need this if you have certain conditions. Hepatitis A vaccine  You may need this if you have certain conditions or if you travel or work in places where you may be exposed to hepatitis A. Hepatitis B vaccine  You may need this if you have certain conditions or if you travel or work in places where you may be exposed to hepatitis B. Haemophilus influenzae type b (Hib) vaccine  You may need this if you have certain conditions. Human papillomavirus (HPV) vaccine  If recommended by your health care provider, you may need three doses over 6 months. You may receive vaccines as individual doses or as more than one vaccine together in one shot (combination vaccines). Talk with your health care provider about the risks and benefits of combination vaccines. What tests do I need? Blood tests  Lipid and cholesterol levels. These may be checked every 5 years, or more frequently if you are over 60 years old.  Hepatitis C test.  Hepatitis B test. Screening  Lung cancer screening. You may have this screening every year starting at age 33 if you have a 30-pack-year history of smoking and currently smoke or have quit within the past 15 years.  Colorectal cancer screening. All adults should have this screening starting at age 58 and continuing until  age 36. Your health care provider may recommend screening at age 58 if you are at increased risk. You will have tests every 1-10 years, depending on your results and the type of screening test.  Diabetes screening. This is done by checking your blood sugar (glucose) after you have not eaten for a while (fasting). You may have this done every 1-3 years.  Mammogram. This may be done every 1-2 years. Talk with your health care provider about when you should start having regular mammograms. This may depend on whether you have a family history of breast cancer.  BRCA-related cancer screening. This may be done if you have a family history of breast, ovarian, tubal, or peritoneal cancers.  Pelvic exam and Pap test. This may be done every 3 years starting at age 58. Starting at age 58, this may be done every 5 years if you have a Pap test in combination with an HPV test. Other tests  Sexually transmitted disease (STD) testing.  Bone density scan. This is done to screen for osteoporosis. You may have this scan if you are at high risk for osteoporosis. Follow these instructions at home: Eating and drinking  Eat a diet that includes fresh fruits and vegetables, whole grains, lean protein, and low-fat dairy.  Take vitamin and mineral supplements as recommended by your health care provider.  Do not drink alcohol if: ? Your health care provider tells you not to drink. ? You are pregnant, may be pregnant, or are planning to become pregnant.  If you drink alcohol: ? Limit how much you have to 0-1 drink a day. ? Be aware of how much alcohol is in your drink. In the [place] one drink equals one 12 oz bottle of beer (355 mL), one 5 oz glass of wine (148 mL), or one 1 oz glass of hard liquor (44 mL). Lifestyle  Take daily care of your teeth and gums.  Stay active. Exercise for at least 30 minutes on 5 or more days each week.  Do not use any products that contain nicotine or tobacco, such as cigarettes,  e-cigarettes, and chewing tobacco. If you need help quitting, ask your health care provider.  If you are sexually active, practice safe sex. Use a condom or other form of birth control (contraception) in order to prevent pregnancy and STIs (sexually transmitted infections).  If told by your health care provider, take low-dose aspirin daily starting at age 62. What's next?  Visit your health care provider once a year for a well check visit.  Ask your health care provider how often you should have your eyes and teeth checked.  Stay up to date on all vaccines. This information is not intended to replace advice given to you by your health care provider. Make sure you discuss any questions you have with your health care provider. Document Revised: 04/08/2018 Document Reviewed: 04/08/2018 Elsevier Patient Education  2020 [employer].

## 2020-01-31 NOTE — Assessment & Plan Note (Signed)
Following with neurology, continue propranolol and primidone. Recent evaluation reviewed.

## 2020-01-31 NOTE — Assessment & Plan Note (Signed)
Chronic, overall improved with probiotic. Occasional use of laxative.

## 2020-01-31 NOTE — Assessment & Plan Note (Signed)
Not on statin therapy, managed on Fish Oil and Co-Q 10. Update lipid panel pending today, consider statin therapy if needed.

## 2020-01-31 NOTE — Progress Notes (Signed)
Subjective:    Patient ID: Jamie Patterson, female    DOB: 1960/10/10, 59 y.o.   MRN: [id number]  HPI  This visit occurred during the SARS-CoV-2 public health emergency.  Safety protocols were in place, including screening questions prior to the visit, additional usage of staff PPE, and extensive cleaning of exam room while observing appropriate contact time as indicated for disinfecting solutions.   Jamie Patterson is a 59 year old female who presents today for complete physical and follow up of chronic conditions.   Immunizations: -Tetanus: Completed in 2013 -Influenza: Completed last season -Shingles: Never completed  -Pneumonia: Completed in 2016 -Covid-19: Completed series    Diet: She endorses a fair diet. She denies eating junk food, does not snack. Exercise: She is not exercising.   Eye exam: Completed in 2021 Dental exam: Completes semi-anally   Pap Smear: Completed recently at GYN Mammogram: Completed at GYN Colonoscopy: Completed stool cards, opts for Cologuard Hep C Screen: Due  BP Readings from Last 3 Encounters:  01/31/20 126/80  12/01/19 123/86  08/01/19 (!) 142/78   The 10-year ASCVD risk score Denman George DC Jr., et al., 2013) is: 3.8%   Values used to calculate the score:     Age: 37 years     Sex: Female     Is Non-Hispanic African American: No     Diabetic: No     Tobacco smoker: No     Systolic Blood Pressure: 126 mmHg     Is BP treated: Yes     HDL Cholesterol: 71.7 mg/dL     Total Cholesterol: 276 mg/dL   Review of Systems  Constitutional: Positive for fatigue. Negative for unexpected weight change.  HENT: Negative for postnasal drip and rhinorrhea.   Respiratory: Positive for cough. Negative for shortness of breath.   Cardiovascular: Negative for chest pain.  Gastrointestinal: Negative for constipation and diarrhea.  Genitourinary: Negative for difficulty urinating.  Musculoskeletal: Positive for arthralgias, back pain and myalgias.  Skin: Negative for  rash.  Allergic/Immunologic: Positive for environmental allergies.  Neurological: Positive for tremors and numbness. Negative for dizziness and headaches.  Psychiatric/Behavioral:       Chronic anxiety and depression       Past Medical History:  Diagnosis Date  . Amblyopia   . ARTHRITIS, TRAUMATIC, ANKLE 08/07/2009   Trimalleolar fracture on right s/p fusion    . Benign essential tremor    worsened by anxiety  . Chronic pain due to injury    at R ankle due to ankle fracture  . Depression    followed by psych  . History of hypertension   . HLD (hyperlipidemia)   . Imbalance    likely multifactorial (s/p balance training at [facility] 08/2010) ?CMT  . Influenza A 09/15/2018  . Iron deficiency anemia   . LLL pneumonia 10/05/2018  . Right ankle pain 2003   trimalleolar fx s/p fusion  . Seizure (HCC)    focal, ex vacuo ventriculomegaly?, pending neuro w/u with sleep deprived EEG  . Syncope and collapse 08/19/2011     Social History   Socioeconomic History  . Marital status: Divorced    Spouse name: Not on file  . Number of children: 1  . Years of education: Not on file  . Highest education level: Not on file  Occupational History  . Occupation: Disabled    Comment: depression  . Occupation: DISABILITY    Employer: UNEMPLOYED  Tobacco Use  . Smoking status: Former Smoker  Packs/day: 1.00    Years: 20.00    Pack years: 20.00    Quit date: 08/12/1995    Years since quitting: 24.4  . Smokeless tobacco: Never Used  Vaping Use  . Vaping Use: Never used  Substance and Sexual Activity  . Alcohol use: No    Alcohol/week: 0.0 standard drinks    Comment: None for > 1 year  . Drug use: No  . Sexual activity: Not on file  Other Topics Concern  . Not on file  Social History Narrative   No caffeine. Divorced. 1 child. 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, although endorses that she falls a lot    Social  Determinants of Health   Financial Resource Strain: Low Risk   . Difficulty of Paying Living Expenses: Not hard at all  Food Insecurity: No Food Insecurity  . Worried About Programme researcher, broadcasting/film/video in the Last Year: Never true  . Ran Out of Food in the Last Year: Never true  Transportation Needs: No Transportation Needs  . Lack of Transportation (Medical): No  . Lack of Transportation (Non-Medical): No  Physical Activity: Inactive  . Days of Exercise per Week: 0 days  . Minutes of Exercise per Session: 0 min  Stress: No Stress Concern Present  . Feeling of Stress : Not at all  Social Connections:   . Frequency of Communication with Friends and Family:   . Frequency of Social Gatherings with Friends and Family:   . Attends Religious Services:   . Active Member of Clubs or Organizations:   . Attends Banker Meetings:   Marland Kitchen Marital Status:   Intimate Partner Violence: Not At Risk  . Fear of Current or Ex-Partner: No  . Emotionally Abused: No  . Physically Abused: No  . Sexually Abused: No    Past Surgical History:  Procedure Laterality Date  . ANKLE FUSION  02/2004   Right-50% disability   . CARPAL TUNNEL RELEASE  12/2017   L CTR repair, R thumb A1 pulley release (Gramig)  . CESAREAN SECTION    . HAMMER TOE SURGERY  09/12/2019  . hospitalization  08/2011   syncope thought 2/2 seizure, MRI - Hyperintensity in the cerebral white matter bilaterally is nonspecific, started on Keppra  . ORIF ANKLE FRACTURE  06/2003   Right    Family History  Problem Relation Age of Onset  . Depression Mother   . Arrhythmia Mother 34       Dx with VTach after having sudden collapse, did NOT have h/o AMI's   . Atrial fibrillation Father   . Hypertension Father   . Osteoarthritis Father   . Aortic aneurysm Father   . Hepatitis Brother        Hep. C  . Anemia Sister     No Known Allergies  Current Outpatient Medications on File Prior to Visit  Medication Sig Dispense Refill  .  acetaminophen (TYLENOL) 500 MG tablet Take 500 mg by mouth as needed.     Marland Kitchen albuterol (VENTOLIN HFA) 108 (90 Base) MCG/ACT inhaler Inhale into the lungs every 6 (six) hours as needed.    Marland Kitchen amLODipine (NORVASC) 10 MG tablet Take 1 tablet (10 mg total) by mouth daily. Blood pressure. 90 tablet 3  . Ascorbic Acid (VITAMIN C) 1000 MG tablet Take 1,000 mg by mouth daily.    Marland Kitchen aspirin EC 81 MG tablet Take 81 mg by mouth every  evening.      . budesonide-formoterol (SYMBICORT) 160-4.5 MCG/ACT inhaler Inhale 2 puffs into the lungs 2 (two) times daily. Rinse mouth after use (Patient taking differently: Inhale 2 puffs into the lungs 2 (two) times daily as needed. Rinse mouth after use) 1 Inhaler 0  . Coenzyme Q10 (COQ-10 PO) Take by mouth.    . cycloSPORINE (RESTASIS) 0.05 % ophthalmic emulsion Place 1 drop into both eyes 2 (two) times daily.    . DULoxetine (CYMBALTA) 60 MG capsule Take 60 mg by mouth 2 (two) times daily.     Marland Kitchen escitalopram (LEXAPRO) 10 MG tablet Take 10 mg by mouth daily.     . ferrous sulfate 325 (65 FE) MG tablet Take 325 mg by mouth daily with breakfast.    . gabapentin (NEURONTIN) 300 MG capsule Take 300 mg by mouth. Take 1 capsule in the morning and 2 capsules in the evening.    . hydrochlorothiazide (HYDRODIURIL) 25 MG tablet Take 1 tablet (25 mg total) by mouth daily. For blood pressure. 90 tablet 3  . lamoTRIgine (LAMICTAL) 150 MG tablet TAKE 1 TABLET BY MOUTH TWICE A DAY 180 tablet 1  . lisinopril (ZESTRIL) 30 MG tablet Take 1 tablet (30 mg total) by mouth daily. SCHEDULE PHYSICAL June 2021 90 tablet 0  . MAGNESIUM PO Take 400 mg by mouth 3 (three) times daily.     . Melatonin 10 MG TABS Take 2 tablets by mouth at bedtime.     . Multiple Vitamin (MULTIVITAMIN) tablet Take 1 tablet by mouth daily.    . naproxen sodium (ANAPROX) 220 MG tablet Take 440 mg by mouth as needed.     . Omega-3 Fatty Acids (FISH OIL PO) Take by mouth.    . primidone (MYSOLINE) 50 MG tablet TAKE ONE TABLET  BY MOUTH AT BEDTIME 90 tablet 2  . propranolol (INDERAL) 40 MG tablet TAKE 1 TABLET BY MOUTH ONCE DAILY 90 tablet 2  . tiZANidine (ZANAFLEX) 2 MG tablet Take 2 mg by mouth at bedtime.    . vitamin B-12 (CYANOCOBALAMIN) 1000 MCG tablet Take 500 mcg by mouth daily.      No current facility-administered medications on file prior to visit.    BP 126/80   Pulse (!) 114   Temp (!) 96.1 F (35.6 C) (Temporal)   Ht 5\' 7"  (1.702 m)   Wt 220 lb 4 oz (99.9 kg)   LMP 06/01/2013   SpO2 98%   BMI 34.50 kg/m    Objective:   Physical Exam  Constitutional: She is oriented to person, place, and time.  HENT:  Right Ear: Tympanic membrane and ear canal normal.  Left Ear: Tympanic membrane and ear canal normal.  Eyes: Pupils are equal, round, and reactive to light.  Cardiovascular: Normal rate and regular rhythm.  Respiratory: Effort normal and breath sounds normal.  GI: Soft. Bowel sounds are normal. There is no abdominal tenderness.  Musculoskeletal:        General: Normal range of motion.     Cervical back: Neck supple.  Neurological: She is alert and oriented to person, place, and time. No cranial nerve deficit.  Reflex Scores:      Patellar reflexes are 2+ on the right side and 2+ on the left side. Skin: Skin is warm and dry.           Assessment & Plan:

## 2020-01-31 NOTE — Assessment & Plan Note (Signed)
Chronic, history of left side repair, will likely need right side completed. Continue to monitor.

## 2020-01-31 NOTE — Assessment & Plan Note (Signed)
Shingrix due, Rx printed. Other immunizations UTD. Pap smear and mammogram UTD, follows with GYN. Colon cancer screening due, orders placed for Cologuard. Encouraged a healthy diet and exercise. Exam today stable. Labs pending.

## 2020-01-31 NOTE — Assessment & Plan Note (Signed)
Following with neurology, continue primidone and propranolol.

## 2020-01-31 NOTE — Assessment & Plan Note (Signed)
Managed on B12 complex and takes daily. Repeat B12 pending.

## 2020-01-31 NOTE — Assessment & Plan Note (Signed)
Well controlled in the office today, continue amlodipine, HCTZ, lisinopril. CMP pending.

## 2020-01-31 NOTE — Assessment & Plan Note (Addendum)
Continued and chronic for years, intermittent, recently returned one month ago. No use of Singulair or Symbicort. Non smoker, quit years ago. No history of asthma.  Will start with resuming Singulair nightly, consider resuming Symbicort vs adding PPI, she will update.

## 2020-01-31 NOTE — Assessment & Plan Note (Signed)
Following with psychiatry and therapy. Continue Lamictal, duloxetine, Lexapro, gabapentin, tizanidine (sleep).

## 2020-02-01 ENCOUNTER — Other Ambulatory Visit (INDEPENDENT_AMBULATORY_CARE_PROVIDER_SITE_OTHER): Payer: Medicare PPO

## 2020-02-01 DIAGNOSIS — R7989 Other specified abnormal findings of blood chemistry: Secondary | ICD-10-CM

## 2020-02-01 LAB — T4, FREE: Free T4: 0.86 ng/dL (ref 0.60–1.60)

## 2020-02-01 LAB — HEPATITIS C ANTIBODY
Hepatitis C Ab: NONREACTIVE
SIGNAL TO CUT-OFF: 0.01 (ref <1.00)

## 2020-02-01 LAB — HIV ANTIBODY (ROUTINE TESTING W REFLEX): HIV 1&2 Ab, 4th Generation: NONREACTIVE

## 2020-02-02 ENCOUNTER — Other Ambulatory Visit: Payer: Self-pay | Admitting: Primary Care

## 2020-02-02 DIAGNOSIS — R7989 Other specified abnormal findings of blood chemistry: Secondary | ICD-10-CM

## 2020-02-14 DIAGNOSIS — E7800 Pure hypercholesterolemia, unspecified: Secondary | ICD-10-CM

## 2020-02-15 ENCOUNTER — Other Ambulatory Visit (INDEPENDENT_AMBULATORY_CARE_PROVIDER_SITE_OTHER): Payer: Medicare PPO

## 2020-02-15 ENCOUNTER — Other Ambulatory Visit: Payer: Self-pay

## 2020-02-15 DIAGNOSIS — R7989 Other specified abnormal findings of blood chemistry: Secondary | ICD-10-CM | POA: Diagnosis not present

## 2020-02-15 LAB — TSH: TSH: 4.47 u[IU]/mL (ref 0.35–4.50)

## 2020-02-15 MED ORDER — ROSUVASTATIN CALCIUM 10 MG PO TABS: 10.0000 mg | ORAL_TABLET | Freq: Every evening | ORAL | 3 refills | Status: DC

## 2020-02-16 LAB — CBC WITH DIFFERENTIAL/PLATELET
Absolute Monocytes: 454 cells/uL (ref 200–950)
Basophils Absolute: 20 cells/uL (ref 0–200)
Basophils Relative: 0.4 %
Eosinophils Absolute: 143 cells/uL (ref 15–500)
Eosinophils Relative: 2.8 %
HCT: 37.6 % (ref 35.0–45.0)
Hemoglobin: 12.8 g/dL (ref 11.7–15.5)
Lymphs Abs: 1530 cells/uL (ref 850–3900)
MCH: 32.7 pg (ref 27.0–33.0)
MCHC: 34 g/dL (ref 32.0–36.0)
MCV: 95.9 fL (ref 80.0–100.0)
MPV: 10.3 fL (ref 7.5–12.5)
Monocytes Relative: 8.9 %
Neutro Abs: 2953 cells/uL (ref 1500–7800)
Neutrophils Relative %: 57.9 %
Platelets: 290 10*3/uL (ref 140–400)
RBC: 3.92 10*6/uL (ref 3.80–5.10)
RDW: 12.3 % (ref 11.0–15.0)
Total Lymphocyte: 30 %
WBC: 5.1 10*3/uL (ref 3.8–10.8)

## 2020-02-16 LAB — PATHOLOGIST SMEAR REVIEW

## 2020-02-22 ENCOUNTER — Ambulatory Visit (INDEPENDENT_AMBULATORY_CARE_PROVIDER_SITE_OTHER): Payer: Medicare PPO | Admitting: Psychology

## 2020-02-22 DIAGNOSIS — F331 Major depressive disorder, recurrent, moderate: Secondary | ICD-10-CM

## 2020-03-08 ENCOUNTER — Other Ambulatory Visit: Payer: Self-pay | Admitting: Primary Care

## 2020-03-08 DIAGNOSIS — I10 Essential (primary) hypertension: Secondary | ICD-10-CM

## 2020-03-12 LAB — COLOGUARD: Cologuard: NEGATIVE

## 2020-03-14 LAB — COLOGUARD: Cologuard: NEGATIVE

## 2020-03-18 LAB — COLOGUARD: COLOGUARD: NEGATIVE

## 2020-03-20 ENCOUNTER — Encounter: Payer: Self-pay | Admitting: Primary Care

## 2020-03-21 ENCOUNTER — Ambulatory Visit: Payer: Medicare PPO | Admitting: Psychology

## 2020-03-22 ENCOUNTER — Other Ambulatory Visit: Payer: Self-pay | Admitting: Primary Care

## 2020-03-22 DIAGNOSIS — R05 Cough: Secondary | ICD-10-CM

## 2020-03-22 DIAGNOSIS — R053 Chronic cough: Secondary | ICD-10-CM

## 2020-04-04 ENCOUNTER — Ambulatory Visit: Payer: Medicare PPO | Admitting: Psychology

## 2020-04-11 ENCOUNTER — Ambulatory Visit: Payer: Medicare PPO | Admitting: Psychology

## 2020-04-17 ENCOUNTER — Other Ambulatory Visit: Payer: Self-pay

## 2020-04-17 ENCOUNTER — Encounter: Payer: Self-pay | Admitting: Family Medicine

## 2020-04-17 ENCOUNTER — Ambulatory Visit: Payer: Medicare PPO | Admitting: Family Medicine

## 2020-04-17 VITALS — BP 136/80 | HR 92 | Temp 98.0°F | Wt 235.5 lb

## 2020-04-17 DIAGNOSIS — R21 Rash and other nonspecific skin eruption: Secondary | ICD-10-CM | POA: Insufficient documentation

## 2020-04-17 MED ORDER — METHYLPREDNISOLONE ACETATE 40 MG/ML IJ SUSP
40.0000 mg | Freq: Once | INTRAMUSCULAR | Status: AC
  Administered 2020-04-17: 40 mg via INTRAMUSCULAR

## 2020-04-17 NOTE — Progress Notes (Signed)
Chief Complaint  Patient presents with  . Rash    x5 days started on neck and now rash on face. Pt stated she noticed whelps at first now rash and bumps.    History of Present Illness: HPI   59 year old female presents with new onset rash on  Face... started on chin/neck.. red splotches.. moved up to cheeks and face. Started 5 days ago.  Very itchy.  No tounge swelling. No lip swelling.  No swallowing or breathing.  No new exposures.  No new lotions, detergents, no new foods no new meds.  no recent antibiotics.   She has been on lisinopril for years.  She has tried benadryl, topical hydrocortisone cream.     This visit occurred during the SARS-CoV-2 public health emergency.  Safety protocols were in place, including screening questions prior to the visit, additional usage of staff PPE, and extensive cleaning of exam room while observing appropriate contact time as indicated for disinfecting solutions.   COVID 19 screen:  No recent travel or known exposure to COVID19 The patient denies respiratory symptoms of COVID 19 at this time. The importance of social distancing was discussed today.     Review of Systems  Constitutional: Negative for chills and fever.  HENT: Negative for congestion and ear pain.   Eyes: Negative for pain and redness.  Respiratory: Negative for cough and shortness of breath.   Cardiovascular: Negative for chest pain, palpitations and leg swelling.  Gastrointestinal: Negative for abdominal pain, blood in stool, constipation, diarrhea, nausea and vomiting.  Genitourinary: Negative for dysuria.  Musculoskeletal: Negative for falls and myalgias.  Skin: Negative for rash.  Neurological: Negative for dizziness.  Psychiatric/Behavioral: Negative for depression. The patient is not nervous/anxious.       Past Medical History:  Diagnosis Date  . Amblyopia   . ARTHRITIS, TRAUMATIC, ANKLE 08/07/2009   Trimalleolar fracture on right s/p fusion    . Benign  essential tremor    worsened by anxiety  . Chronic pain due to injury    at R ankle due to ankle fracture  . Depression    followed by psych  . History of hypertension   . HLD (hyperlipidemia)   . Imbalance    likely multifactorial (s/p balance training at [facility] 08/2010) ?CMT  . Influenza A 09/15/2018  . Iron deficiency anemia   . LLL pneumonia 10/05/2018  . Right ankle pain 2003   trimalleolar fx s/p fusion  . Seizure (HCC)    focal, ex vacuo ventriculomegaly?, pending neuro w/u with sleep deprived EEG  . Syncope and collapse 08/19/2011    reports that she quit smoking about 24 years ago. She has a 20.00 pack-year smoking history. She has never used smokeless tobacco. She reports that she does not drink alcohol and does not use drugs.   Current Outpatient Medications:  .  acetaminophen (TYLENOL) 500 MG tablet, Take 500 mg by mouth as needed. , Disp: , Rfl:  .  amLODipine (NORVASC) 10 MG tablet, Take 1 tablet (10 mg total) by mouth daily. Blood pressure., Disp: 90 tablet, Rfl: 3 .  Ascorbic Acid (VITAMIN C) 1000 MG tablet, Take 1,000 mg by mouth daily., Disp: , Rfl:  .  aspirin EC 81 MG tablet, Take 81 mg by mouth every evening.  , Disp: , Rfl:  .  Coenzyme Q10 (COQ-10 PO), Take by mouth., Disp: , Rfl:  .  cycloSPORINE (RESTASIS) 0.05 % ophthalmic emulsion, Place 1 drop into both eyes 2 (two) times  daily., Disp: , Rfl:  .  DULoxetine (CYMBALTA) 60 MG capsule, Take 60 mg by mouth 2 (two) times daily. , Disp: , Rfl:  .  escitalopram (LEXAPRO) 10 MG tablet, Take 10 mg by mouth daily. , Disp: , Rfl:  .  ferrous sulfate 325 (65 FE) MG tablet, Take 325 mg by mouth daily with breakfast., Disp: , Rfl:  .  gabapentin (NEURONTIN) 300 MG capsule, Take 300 mg by mouth. Take 1 capsule in the morning and 2 capsules in the evening., Disp: , Rfl:  .  hydrochlorothiazide (HYDRODIURIL) 25 MG tablet, TAKE ONE TABLET BY MOUTH DAILY FOR BLOODPRESSURE, Disp: 90 tablet, Rfl: 1 .  lamoTRIgine (LAMICTAL) 150 MG  tablet, TAKE 1 TABLET BY MOUTH TWICE A DAY, Disp: 180 tablet, Rfl: 1 .  lisinopril (ZESTRIL) 30 MG tablet, TAKE 1 TABLET BY MOUTH ONCE A DAY FOR BLOOD PRESSURE, Disp: 90 tablet, Rfl: 1 .  MAGNESIUM PO, Take 400 mg by mouth 3 (three) times daily. , Disp: , Rfl:  .  Melatonin 10 MG TABS, Take 2 tablets by mouth at bedtime. , Disp: , Rfl:  .  montelukast (SINGULAIR) 10 MG tablet, TAKE 1 TABLET BY MOUTH AT BEDTIME. FOR COUGH AND ALLERGIES, Disp: 90 tablet, Rfl: 0 .  Multiple Vitamin (MULTIVITAMIN) tablet, Take 1 tablet by mouth daily., Disp: , Rfl:  .  naproxen sodium (ANAPROX) 220 MG tablet, Take 440 mg by mouth as needed. , Disp: , Rfl:  .  Omega-3 Fatty Acids (FISH OIL PO), Take by mouth., Disp: , Rfl:  .  primidone (MYSOLINE) 50 MG tablet, TAKE ONE TABLET BY MOUTH AT BEDTIME, Disp: 90 tablet, Rfl: 2 .  propranolol (INDERAL) 40 MG tablet, TAKE 1 TABLET BY MOUTH ONCE DAILY, Disp: 90 tablet, Rfl: 2 .  rosuvastatin (CRESTOR) 10 MG tablet, Take 1 tablet (10 mg total) by mouth every evening. For cholesterol., Disp: 90 tablet, Rfl: 3 .  tiZANidine (ZANAFLEX) 2 MG tablet, Take 2 mg by mouth at bedtime., Disp: , Rfl:  .  tiZANidine (ZANAFLEX) 4 MG tablet, , Disp: , Rfl:  .  vitamin B-12 (CYANOCOBALAMIN) 1000 MCG tablet, Take 500 mcg by mouth daily. , Disp: , Rfl:  .  albuterol (VENTOLIN HFA) 108 (90 Base) MCG/ACT inhaler, Inhale into the lungs every 6 (six) hours as needed. (Patient not taking: Reported on 04/17/2020), Disp: , Rfl:  .  budesonide-formoterol (SYMBICORT) 160-4.5 MCG/ACT inhaler, Inhale 2 puffs into the lungs 2 (two) times daily. Rinse mouth after use (Patient not taking: Reported on 04/17/2020), Disp: 1 Inhaler, Rfl: 0   Observations/Objective: Blood pressure 136/80, pulse 92, temperature 98 F (36.7 C), temperature source Temporal, weight 235 lb 8 oz (106.8 kg), last menstrual period 06/01/2013, SpO2 96 %.  Physical Exam Constitutional:      General: She is not in acute distress.     Appearance: Normal appearance. She is well-developed. She is not ill-appearing or toxic-appearing.  HENT:     Head: Normocephalic.     Right Ear: Hearing, tympanic membrane, ear canal and external ear normal. Tympanic membrane is not erythematous, retracted or bulging.     Left Ear: Hearing, tympanic membrane, ear canal and external ear normal. Tympanic membrane is not erythematous, retracted or bulging.     Nose: No mucosal edema or rhinorrhea.     Right Sinus: No maxillary sinus tenderness or frontal sinus tenderness.     Left Sinus: No maxillary sinus tenderness or frontal sinus tenderness.     Mouth/Throat:  Pharynx: Uvula midline.  Eyes:     General: Lids are normal. Lids are everted, no foreign bodies appreciated.     Conjunctiva/sclera: Conjunctivae normal.     Pupils: Pupils are equal, round, and reactive to light.  Neck:     Thyroid: No thyroid mass or thyromegaly.     Vascular: No carotid bruit.     Trachea: Trachea normal.  Cardiovascular:     Rate and Rhythm: Normal rate and regular rhythm.     Pulses: Normal pulses.     Heart sounds: Normal heart sounds, S1 normal and S2 normal. No murmur heard.  No friction rub. No gallop.   Pulmonary:     Effort: Pulmonary effort is normal. No tachypnea or respiratory distress.     Breath sounds: Normal breath sounds. No decreased breath sounds, wheezing, rhonchi or rales.  Abdominal:     General: Bowel sounds are normal.     Palpations: Abdomen is soft.     Tenderness: There is no abdominal tenderness.  Musculoskeletal:     Cervical back: Normal range of motion and neck supple.  Lymphadenopathy:     Cervical: No cervical adenopathy.  Skin:    General: Skin is warm and dry.     Findings: No rash.  Neurological:     Mental Status: She is alert.  Psychiatric:        Mood and Affect: Mood is not anxious or depressed.        Speech: Speech normal.        Behavior: Behavior normal. Behavior is cooperative.        Thought  Content: Thought content normal.        Judgment: Judgment normal.          Assessment and Plan      Kerby Nora, MD

## 2020-04-17 NOTE — Patient Instructions (Addendum)
Start zyrtec or allegra at night.  Can use topical steroid cream twice daily.  Hold lisinopril for 2-3 day.Marland Kitchen an start back as long as no lip or tounge swelling.  Go to ER if shortness of breath or tounge, oral swelling.

## 2020-04-17 NOTE — Assessment & Plan Note (Signed)
Appear most consistent  With allergic dermatitis.  No clear sign of angioedema but pt on ACEI.. hold this temporarily.    Start zyrtec or allegra at night.  Can use topical steroid cream twice daily.  Hold lisinopril for 2-3 day.Marland Kitchen an start back as long as no lip or tounge swelling.

## 2020-04-18 ENCOUNTER — Ambulatory Visit: Payer: Medicare PPO | Admitting: Psychology

## 2020-06-06 ENCOUNTER — Ambulatory Visit: Payer: Medicare PPO | Admitting: Neurology

## 2020-06-11 ENCOUNTER — Ambulatory Visit: Payer: Medicare PPO | Admitting: Neurology

## 2020-06-18 DIAGNOSIS — I10 Essential (primary) hypertension: Secondary | ICD-10-CM

## 2020-06-19 MED ORDER — OLMESARTAN MEDOXOMIL 20 MG PO TABS: 20.0000 mg | ORAL_TABLET | Freq: Every day | ORAL | 0 refills | Status: DC

## 2020-06-21 NOTE — Progress Notes (Signed)
Assessment/Plan:    1.  Essential Tremor, exacerbated by GAD             -increase primidone, 50 mg bid             -Continue propranolol, 40 mg.  Did not want to increase this because of history of depression 2.  Chronic migraine/chronic daily headache  -okay right now 3.  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 [name] 4.  Hypertension             -Patient was on multiple antihypertensives, but amlodipine started causing swelling and lisinopril cough, so those were recently held and she is on hydrochlorothiazide right now and olmesartan.  This is in addition to the propranolol I have her on for tremor.   5.  Cough  -discussed with her that she needs to f/u with pulmonology.  She has some wheezing today and has DOE and some pursed lip breathing.  Concerned mostly about this.  I looked up that she used to see Dr. Sherene Sires and she promised me that she would call and make a f/u appt Subjective:   Jamie Patterson was seen today in follow up for essential tremor.  My previous records were reviewed prior to todays visit.  Tremor is a bit worse but she isn't working and isn't keeping grandkids so not as bothersome.  She notes it when eating or on the computer.   Medical records are reviewed since last visit.  Patient had abnormal TSH of 12.91 in June of this year.  However, free T4 was normal.  She has also noted that the amlodipine was causing her swelling so she stopped it.  In addition, lisinopril was causing cough.  She is off of that but still has cough.  She has had cough x 2 years.    Current prescribed movement disorder medications: Primidone, 50 mg nightly Propranolol, 40 mg daily    ALLERGIES:  No Known Allergies  CURRENT MEDICATIONS:  Outpatient Encounter Medications as of 06/26/2020  Medication Sig  . acetaminophen (TYLENOL) 500 MG tablet Take 500  mg by mouth as needed.   Marland Kitchen albuterol (VENTOLIN HFA) 108 (90 Base) MCG/ACT inhaler Inhale into the lungs every 6 (six) hours as needed.   . Ascorbic Acid (VITAMIN C) 1000 MG tablet Take 1,000 mg by mouth daily.  Marland Kitchen aspirin EC 81 MG tablet Take 81 mg by mouth every evening.    . Coenzyme Q10 (COQ-10 PO) Take by mouth.  . cycloSPORINE (RESTASIS) 0.05 % ophthalmic emulsion Place 1 drop into both eyes 2 (two) times daily.  . DULoxetine (CYMBALTA) 60 MG capsule Take 60 mg by mouth 2 (two) times daily.   Marland Kitchen escitalopram (LEXAPRO) 10 MG tablet Take 10 mg by mouth daily.   . ferrous sulfate 325 (65 FE) MG tablet Take 325 mg by mouth daily with breakfast.  . gabapentin (NEURONTIN) 300 MG capsule Take 300 mg by mouth. Take 1 capsule in the morning and 2 capsules in the evening.  . hydrochlorothiazide (HYDRODIURIL) 25 MG tablet TAKE ONE TABLET BY MOUTH DAILY FOR BLOODPRESSURE  . lamoTRIgine (LAMICTAL) 150 MG tablet TAKE 1 TABLET BY MOUTH TWICE A DAY  . MAGNESIUM PO Take 400 mg by mouth 3 (three) times daily.   Marland Kitchen  Melatonin 10 MG TABS Take 2 tablets by mouth at bedtime.   . Multiple Vitamin (MULTIVITAMIN) tablet Take 1 tablet by mouth daily.  . naproxen sodium (ANAPROX) 220 MG tablet Take 440 mg by mouth as needed.   Marland Kitchen olmesartan (BENICAR) 20 MG tablet Take 1 tablet (20 mg total) by mouth daily. For blood pressure.  . Omega-3 Fatty Acids (FISH OIL PO) Take 1 tablet by mouth daily.   . primidone (MYSOLINE) 50 MG tablet TAKE ONE TABLET BY MOUTH AT BEDTIME  . propranolol (INDERAL) 40 MG tablet TAKE 1 TABLET BY MOUTH ONCE DAILY  . rosuvastatin (CRESTOR) 10 MG tablet Take 1 tablet (10 mg total) by mouth every evening. For cholesterol.  Marland Kitchen tiZANidine (ZANAFLEX) 2 MG tablet Take 2 mg by mouth at bedtime.  . vitamin B-12 (CYANOCOBALAMIN) 1000 MCG tablet Take 500 mcg by mouth daily.   . [DISCONTINUED] budesonide-formoterol (SYMBICORT) 160-4.5 MCG/ACT inhaler Inhale 2 puffs into the lungs 2 (two) times daily. Rinse  mouth after use (Patient not taking: Reported on 06/26/2020)  . [DISCONTINUED] montelukast (SINGULAIR) 10 MG tablet TAKE 1 TABLET BY MOUTH AT BEDTIME. FOR COUGH AND ALLERGIES (Patient not taking: Reported on 06/26/2020)  . [DISCONTINUED] tiZANidine (ZANAFLEX) 4 MG tablet    No facility-administered encounter medications on file as of 06/26/2020.     Objective:    PHYSICAL EXAMINATION:    VITALS:   Vitals:   06/26/20 1439  BP: 135/88  Pulse: 81  SpO2: 97%  Weight: 236 lb (107 kg)  Height: 5\' 7"  (1.702 m)    GEN:  The patient appears stated age and is in NAD. HEENT:  Normocephalic, atraumatic.  The mucous membranes are moist. The superficial temporal arteries are without ropiness or tenderness. CV:  RRR Lungs:  Rare exp wheezes.  Some pursed lip breathing.  Some DOE.   Neck/HEME:  There are no carotid bruits bilaterally.  Neurological examination:  Orientation: The patient is alert and oriented x3. Cranial nerves: There is good facial symmetry. The speech is fluent and clear. Soft palate rises symmetrically and there is no tongue deviation. Hearing is intact to conversational tone. Sensation: Sensation is intact to light touch throughout Motor: Strength is at least antigravity x4.  Movement examination: Tone: There is normal tone in the UE/LE Abnormal movements: mild postural tremor bilaterally.  Mild trouble with archimedes spiral bilaterally Coordination:  There is no decremation with RAM's Gait and Station: The patient has no difficulty arising out of a deep-seated chair without the use of the hands. The patient's stride length is good I have reviewed and interpreted the following labs independently   Chemistry      Component Value Date/Time   NA 136 01/31/2020 0805   K 5.0 01/31/2020 0805   CL 98 01/31/2020 0805   CO2 28 01/31/2020 0805   BUN 11 01/31/2020 0805   CREATININE 0.78 01/31/2020 0805   CREATININE 0.67 02/16/2018 1332      Component Value Date/Time    CALCIUM 10.1 01/31/2020 0805   ALKPHOS 168 (H) 01/31/2020 0805   AST 22 01/31/2020 0805   ALT 24 01/31/2020 0805   BILITOT 0.3 01/31/2020 0805      Lab Results  Component Value Date   WBC 5.1 02/15/2020   HGB 12.8 02/15/2020   HCT 37.6 02/15/2020   MCV 95.9 02/15/2020   PLT 290 02/15/2020   Lab Results  Component Value Date   TSH 4.47 02/15/2020     Chemistry  Component Value Date/Time   NA 136 01/31/2020 0805   K 5.0 01/31/2020 0805   CL 98 01/31/2020 0805   CO2 28 01/31/2020 0805   BUN 11 01/31/2020 0805   CREATININE 0.78 01/31/2020 0805   CREATININE 0.67 02/16/2018 1332      Component Value Date/Time   CALCIUM 10.1 01/31/2020 0805   ALKPHOS 168 (H) 01/31/2020 0805   AST 22 01/31/2020 0805   ALT 24 01/31/2020 0805   BILITOT 0.3 01/31/2020 0805         Total time spent on today's visit was 30 minutes, including both face-to-face time and nonface-to-face time.  Time included that spent on review of records (prior notes available to me/labs/imaging if pertinent), discussing treatment and goals, answering patient's questions and coordinating care.  Cc:  Doreene Nest, NP

## 2020-06-26 ENCOUNTER — Ambulatory Visit: Payer: Medicare PPO | Admitting: Neurology

## 2020-06-26 ENCOUNTER — Other Ambulatory Visit: Payer: Self-pay

## 2020-06-26 ENCOUNTER — Encounter: Payer: Self-pay | Admitting: Neurology

## 2020-06-26 VITALS — BP 135/88 | HR 81 | Ht 67.0 in | Wt 236.0 lb

## 2020-06-26 DIAGNOSIS — R059 Cough, unspecified: Secondary | ICD-10-CM | POA: Diagnosis not present

## 2020-06-26 DIAGNOSIS — F411 Generalized anxiety disorder: Secondary | ICD-10-CM

## 2020-06-26 DIAGNOSIS — R251 Tremor, unspecified: Secondary | ICD-10-CM

## 2020-06-26 MED ORDER — PRIMIDONE 50 MG PO TABS: 50.0000 mg | ORAL_TABLET | Freq: Two times a day (BID) | ORAL | 2 refills | Status: DC

## 2020-06-26 NOTE — Patient Instructions (Addendum)
1.  Follow up with pulmonary.  Give them a call and make an appointment.  You previously saw Dr. Sherene Sires 2.  Increase primidone, 50 mg, 1 in the AM, 1 at night 3.  Continue propranolol, 40 mg daily.  The physicians and staff at [facility] are committed to providing excellent care. You may receive a survey requesting feedback about your experience at our office. We strive to receive "very good" responses to the survey questions. If you feel that your experience would prevent you from giving the office a "very good " response, please contact our office to try to remedy the situation. We may be reached at [phone]. Thank you for taking the time out of your busy day to complete the survey.

## 2020-07-12 ENCOUNTER — Encounter: Payer: Self-pay | Admitting: Primary Care

## 2020-07-12 ENCOUNTER — Ambulatory Visit: Payer: Medicare PPO | Admitting: Primary Care

## 2020-07-12 ENCOUNTER — Other Ambulatory Visit: Payer: Self-pay | Admitting: Primary Care

## 2020-07-12 ENCOUNTER — Other Ambulatory Visit: Payer: Self-pay

## 2020-07-12 VITALS — BP 112/88 | HR 81 | Temp 97.6°F | Ht 67.0 in | Wt 231.0 lb

## 2020-07-12 DIAGNOSIS — R053 Chronic cough: Secondary | ICD-10-CM | POA: Diagnosis not present

## 2020-07-12 DIAGNOSIS — R42 Dizziness and giddiness: Secondary | ICD-10-CM | POA: Diagnosis not present

## 2020-07-12 DIAGNOSIS — R7989 Other specified abnormal findings of blood chemistry: Secondary | ICD-10-CM | POA: Diagnosis not present

## 2020-07-12 DIAGNOSIS — I10 Essential (primary) hypertension: Secondary | ICD-10-CM

## 2020-07-12 DIAGNOSIS — E7800 Pure hypercholesterolemia, unspecified: Secondary | ICD-10-CM

## 2020-07-12 DIAGNOSIS — R7303 Prediabetes: Secondary | ICD-10-CM | POA: Diagnosis not present

## 2020-07-12 LAB — TSH: TSH: 6.34 u[IU]/mL — ABNORMAL HIGH (ref 0.35–4.50)

## 2020-07-12 LAB — LIPID PANEL
Cholesterol: 194 mg/dL (ref 0–200)
HDL: 70.5 mg/dL (ref >39.00)
LDL Cholesterol: 94 mg/dL (ref 0–99)
NonHDL: 123.5
Total CHOL/HDL Ratio: 3
Triglycerides: 146 mg/dL (ref 0.0–149.0)
VLDL: 29.2 mg/dL (ref 0.0–40.0)

## 2020-07-12 LAB — HEMOGLOBIN A1C: Hgb A1c MFr Bld: 5.8 % (ref 4.6–6.5)

## 2020-07-12 NOTE — Assessment & Plan Note (Signed)
Improved with removal of lisinopril, but continued. She will be seeing pulmonology again in early 2022.

## 2020-07-12 NOTE — Patient Instructions (Addendum)
Stop by the lab prior to leaving today. I will notify you of your results once received.   Nasal Congestion/Ear Pressure/Sinus Pressure: Try using Flonase (fluticasone) nasal spray. Instill 1 spray in each nostril twice daily.   Consider reducing your dose of Primidone to once daily to see if this helps with dizziness.   Continue to monitor your blood pressure.  Ensure you are consuming 64 ounces of water daily.  It was a pleasure to see you today!

## 2020-07-12 NOTE — Assessment & Plan Note (Signed)
Symptoms seem more like vertigo, but she has no prior history.   She does have fluid build up behind the left TM. Will trial Flonase.  Primidone could be contributing given recent dose changes and symptoms occurring shortly after.   Neuro exam today unremarkable. Checking labs.  She will start with Flonase first, then reduce Primidone to once daily dosing if no improvement.

## 2020-07-12 NOTE — Assessment & Plan Note (Signed)
Repeat lipid panel pending. Compliant to rosuvastatin 10 mg.

## 2020-07-12 NOTE — Assessment & Plan Note (Signed)
Repeat A1C pending. 

## 2020-07-12 NOTE — Progress Notes (Signed)
Subjective:    Patient ID: Jamie Patterson, female    DOB: 1961/06/10, 59 y.o.   MRN: [id number]  HPI  This visit occurred during the SARS-CoV-2 public health emergency.  Safety protocols were in place, including screening questions prior to the visit, additional usage of staff PPE, and extensive cleaning of exam room while observing appropriate contact time as indicated for disinfecting solutions.   Jamie Patterson is a 59 year old female with a history of hypertension, seizure disorder, hyperlipidemia who presents today for follow up of hypertension. She is also due for repeat lipid panel and A1C.  1) Essential Hypertension: She send a my chart message on 06/18/20 with reports of continued, chronic cough and ankle edema. She was managed on lisinopril and amlodipine at the time. Given symptoms we discussed to stop amlodipine and lisinopril, we continued HCTZ 25 mg and added olmesartan 20 mg.   Today she is compliant to HCTZ 25 mg and olmesartan 20 mg. She is checking her BP at home which is running mostly 120's-130's/70's-80's. Her ankle edema has resolved. She continues to notice a cough that has improved slightly.   2) Dizziness: She would also like to discuss dizziness. This began about two weeks ago days ago "room spinning" sensation. Her first incidence was two weeks ago when she was bending over. This has also occurred when turning her head in bed and with positional changes (sitting to standing). She did see her neurologist around the same time (prior to symptoms beginning) who increased her primidone to BID dosing for tremor.   She drinks a lot of water. Denies unilateral weakness/numbness, facial drooping, changes in speech. She's never experienced dizziness like this before.   BP Readings from Last 3 Encounters:  07/12/20 112/88  06/26/20 135/88  04/17/20 136/80  .  Review of Systems  Eyes: Negative for visual disturbance.  Respiratory: Negative for shortness of breath.   Cardiovascular:  Negative for chest pain.  Neurological: Positive for dizziness and tremors. Negative for speech difficulty, numbness and headaches.  Psychiatric/Behavioral: Negative for confusion.       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   Trimalleolar fracture on right s/p fusion     Benign essential tremor    worsened by anxiety   Chronic pain due to injury    at R ankle due to ankle fracture   Depression    followed by psych   History of hypertension    HLD (hyperlipidemia)    Imbalance    likely multifactorial (s/p balance training at [facility] 08/2010) ?CMT   Influenza A 09/15/2018   Iron deficiency anemia    LLL pneumonia 10/05/2018   Right ankle pain 2003   trimalleolar fx s/p fusion   Seizure (HCC)    focal, ex vacuo ventriculomegaly?, pending neuro w/u with sleep deprived EEG   Syncope and collapse 08/19/2011     Social History   Socioeconomic History   Marital status: Divorced    Spouse name: Not on file   Number of children: 1   Years of education: Not on file   Highest education level: Not on file  Occupational History   Occupation: Disabled    Comment: depression   Occupation: DISABILITY    Employer: UNEMPLOYED  Tobacco Use   Smoking status: Former Smoker    Packs/day: 1.00    Years: 20.00    Pack years: 20.00    Quit date: 08/12/1995  Years since quitting: 24.9   Smokeless tobacco: Never Used  Vaping Use   Vaping Use: Never used  Substance and Sexual Activity   Alcohol use: No    Alcohol/week: 0.0 standard drinks    Comment: occassinal   Drug use: No   Sexual activity: Not on file  Other Topics Concern   Not on file  Social History Narrative   No caffeine. Divorced. 1 child. 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, although endorses that she falls a lot    Social Determinants of Health   Financial Resource Strain:  Low Risk    Difficulty of Paying Living Expenses: Not hard at all  Food Insecurity: No Food Insecurity   Worried About Programme researcher, broadcasting/film/video in the Last Year: Never true   Ran Out of Food in the Last Year: Never true  Transportation Needs: No Transportation Needs   Lack of Transportation (Medical): No   Lack of Transportation (Non-Medical): No  Physical Activity: Inactive   Days of Exercise per Week: 0 days   Minutes of Exercise per Session: 0 min  Stress: No Stress Concern Present   Feeling of Stress : Not at all  Social Connections:    Frequency of Communication with Friends and Family: Not on file   Frequency of Social Gatherings with Friends and Family: Not on file   Attends Religious Services: Not on file   Active Member of Clubs or Organizations: Not on file   Attends Banker Meetings: Not on file   Marital Status: Not on file  Intimate Partner Violence: Not At Risk   Fear of Current or Ex-Partner: No   Emotionally Abused: No   Physically Abused: No   Sexually Abused: No    Past Surgical History:  Procedure Laterality Date   ANKLE FUSION  02/2004   Right-50% disability    CARPAL TUNNEL RELEASE  12/2017   L CTR repair, R thumb A1 pulley release (Gramig)   CESAREAN SECTION     HAMMER TOE SURGERY  09/12/2019   hospitalization  08/2011   syncope thought 2/2 seizure, MRI - Hyperintensity in the cerebral white matter bilaterally is nonspecific, started on Keppra   ORIF ANKLE FRACTURE  06/2003   Right    Family History  Problem Relation Age of Onset   Depression Mother    Arrhythmia Mother 14       Dx with VTach after having sudden collapse, did NOT have h/o AMI's    Atrial fibrillation Father    Hypertension Father    Osteoarthritis Father    Aortic aneurysm Father    Hepatitis Brother        Hep. C   Anemia Sister     No Known Allergies  Current Outpatient Medications on File Prior to Visit  Medication Sig Dispense  Refill   acetaminophen (TYLENOL) 500 MG tablet Take 500 mg by mouth as needed.      albuterol (VENTOLIN HFA) 108 (90 Base) MCG/ACT inhaler Inhale into the lungs every 6 (six) hours as needed.      Ascorbic Acid (VITAMIN C) 1000 MG tablet Take 1,000 mg by mouth daily.     aspirin EC 81 MG tablet Take 81 mg by mouth every evening.       Coenzyme Q10 (COQ-10 PO) Take by mouth.     cycloSPORINE (RESTASIS) 0.05 % ophthalmic emulsion Place 1 drop into both eyes 2 (two) times daily.  DULoxetine (CYMBALTA) 60 MG capsule Take 60 mg by mouth 2 (two) times daily.      escitalopram (LEXAPRO) 10 MG tablet Take 10 mg by mouth daily.      ferrous sulfate 325 (65 FE) MG tablet Take 325 mg by mouth daily with breakfast.     gabapentin (NEURONTIN) 300 MG capsule Take 300 mg by mouth. Take 1 capsule in the morning and 2 capsules in the evening.     hydrochlorothiazide (HYDRODIURIL) 25 MG tablet TAKE ONE TABLET BY MOUTH DAILY FOR BLOODPRESSURE 90 tablet 1   lamoTRIgine (LAMICTAL) 150 MG tablet TAKE 1 TABLET BY MOUTH TWICE A DAY 180 tablet 1   MAGNESIUM PO Take 400 mg by mouth 3 (three) times daily.      Melatonin 10 MG TABS Take 2 tablets by mouth at bedtime.      Multiple Vitamin (MULTIVITAMIN) tablet Take 1 tablet by mouth daily.     naproxen sodium (ANAPROX) 220 MG tablet Take 440 mg by mouth as needed.      olmesartan (BENICAR) 20 MG tablet Take 1 tablet (20 mg total) by mouth daily. For blood pressure. 30 tablet 0   Omega-3 Fatty Acids (FISH OIL PO) Take 1 tablet by mouth daily.      primidone (MYSOLINE) 50 MG tablet Take 1 tablet (50 mg total) by mouth in the morning and at bedtime. 180 tablet 2   propranolol (INDERAL) 40 MG tablet TAKE 1 TABLET BY MOUTH ONCE DAILY 90 tablet 2   rosuvastatin (CRESTOR) 10 MG tablet Take 1 tablet (10 mg total) by mouth every evening. For cholesterol. 90 tablet 3   tiZANidine (ZANAFLEX) 2 MG tablet Take 2 mg by mouth at bedtime.     vitamin B-12  (CYANOCOBALAMIN) 1000 MCG tablet Take 500 mcg by mouth daily.      No current facility-administered medications on file prior to visit.    BP 112/88    Pulse 81    Temp 97.6 F (36.4 C) (Temporal)    Ht 5\' 7"  (1.702 m)    Wt 231 lb (104.8 kg)    LMP 06/01/2013    SpO2 97%    BMI 36.18 kg/m    Objective:   Physical Exam HENT:     Right Ear: Tympanic membrane and ear canal normal. Tympanic membrane is not erythematous or bulging.     Left Ear: Ear canal normal. Tympanic membrane is not erythematous or bulging.     Ears:     Comments: Fluid build up behind left TM. Eyes:     Extraocular Movements: Extraocular movements intact.  Cardiovascular:     Rate and Rhythm: Normal rate and regular rhythm.  Pulmonary:     Effort: Pulmonary effort is normal.     Breath sounds: Normal breath sounds.  Musculoskeletal:     Cervical back: Neck supple.  Skin:    General: Skin is warm and dry.  Neurological:     Mental Status: She is oriented to person, place, and time.     Cranial Nerves: No cranial nerve deficit.     Motor: No weakness.     Coordination: Coordination normal.     Comments: Bilateral upper and lower extremities with equal strength.  No arm drift. Steady gait in office.  Psychiatric:        Mood and Affect: Mood normal.            Assessment & Plan:

## 2020-07-12 NOTE — Assessment & Plan Note (Signed)
Well controlled in the office today. Continue olmesartan 20 mg and HCTZ 25 mg.

## 2020-07-13 NOTE — Telephone Encounter (Signed)
Pharmacy requests refill on: Hydrochlorothiazide 25 mg   LAST REFILL: 03/09/2020 (Q-90, R-1)  LAST OV: 07/12/2020 NEXT OV: Not Scheduled  PHARMACY: [facility]

## 2020-07-15 DIAGNOSIS — R7989 Other specified abnormal findings of blood chemistry: Secondary | ICD-10-CM

## 2020-07-16 DIAGNOSIS — Z1231 Encounter for screening mammogram for malignant neoplasm of breast: Secondary | ICD-10-CM | POA: Diagnosis not present

## 2020-07-16 DIAGNOSIS — Z6839 Body mass index (BMI) 39.0-39.9, adult: Secondary | ICD-10-CM | POA: Diagnosis not present

## 2020-07-16 DIAGNOSIS — Z01419 Encounter for gynecological examination (general) (routine) without abnormal findings: Secondary | ICD-10-CM | POA: Diagnosis not present

## 2020-07-17 ENCOUNTER — Encounter: Payer: Self-pay | Admitting: Primary Care

## 2020-07-18 ENCOUNTER — Other Ambulatory Visit: Payer: Self-pay | Admitting: Primary Care

## 2020-07-18 DIAGNOSIS — I10 Essential (primary) hypertension: Secondary | ICD-10-CM

## 2020-07-18 LAB — HM MAMMOGRAPHY

## 2020-07-26 ENCOUNTER — Ambulatory Visit (INDEPENDENT_AMBULATORY_CARE_PROVIDER_SITE_OTHER): Payer: Medicare PPO | Admitting: Psychology

## 2020-07-26 ENCOUNTER — Other Ambulatory Visit: Payer: Self-pay

## 2020-07-26 DIAGNOSIS — F4481 Dissociative identity disorder: Secondary | ICD-10-CM | POA: Diagnosis not present

## 2020-07-27 ENCOUNTER — Other Ambulatory Visit: Payer: Self-pay | Admitting: Neurology

## 2020-07-27 NOTE — Telephone Encounter (Signed)
Rx(s) sent to pharmacy electronically.  

## 2020-08-06 ENCOUNTER — Encounter: Payer: Self-pay | Admitting: Primary Care

## 2020-08-08 ENCOUNTER — Other Ambulatory Visit: Payer: Medicare PPO

## 2020-08-14 ENCOUNTER — Other Ambulatory Visit: Payer: Self-pay

## 2020-08-14 ENCOUNTER — Other Ambulatory Visit (INDEPENDENT_AMBULATORY_CARE_PROVIDER_SITE_OTHER): Payer: Medicare PPO

## 2020-08-14 DIAGNOSIS — Z23 Encounter for immunization: Secondary | ICD-10-CM

## 2020-08-14 DIAGNOSIS — R7989 Other specified abnormal findings of blood chemistry: Secondary | ICD-10-CM | POA: Diagnosis not present

## 2020-08-14 LAB — T4, FREE: Free T4: 0.62 ng/dL (ref 0.60–1.60)

## 2020-08-14 LAB — TSH: TSH: 11.01 u[IU]/mL — ABNORMAL HIGH (ref 0.35–4.50)

## 2020-08-15 LAB — THYROID PEROXIDASE ANTIBODY: Thyroperoxidase Ab SerPl-aCnc: 1 IU/mL (ref <9)

## 2020-08-17 ENCOUNTER — Other Ambulatory Visit: Payer: Self-pay | Admitting: Primary Care

## 2020-08-17 DIAGNOSIS — E039 Hypothyroidism, unspecified: Secondary | ICD-10-CM

## 2020-08-17 MED ORDER — LEVOTHYROXINE SODIUM 25 MCG PO TABS: ORAL_TABLET | ORAL | 1 refills | Status: DC

## 2020-09-19 ENCOUNTER — Telehealth: Payer: Self-pay

## 2020-09-19 DIAGNOSIS — E039 Hypothyroidism, unspecified: Secondary | ICD-10-CM

## 2020-09-19 MED ORDER — LEVOTHYROXINE SODIUM 50 MCG PO TABS: ORAL_TABLET | ORAL | 0 refills | Status: DC

## 2020-09-19 NOTE — Telephone Encounter (Signed)
Landon from [facility] pharmacy left v/m for cb on pts levothyroxine.. I called back and spoke with Thayer Ohm at [facility]; they  need to know pts present dose of levothyroxine 25 mcg. When [facility] pharmacy tries to send refill the refill comes back with pt taking 1 tab daily for 2wks then increasing to 2 tabs daily thereafter. Just wants to verify if pt is taking these same directions or is pt on 2 tabs daily. Sending note to [facility] CMA. Do not see where pt has scheduled FU lab appt.

## 2020-09-19 NOTE — Telephone Encounter (Signed)
Per lab result note on 08/14/20 she was to start with that regimen and then set up a follow up visit for 5 weeks later.  Tell pharmacy that her next refill should be for 50 mcg, I will change Rx. Tell patient that she needs to schedule her follow up as discussed.

## 2020-09-20 NOTE — Telephone Encounter (Signed)
Called patient reviewed information as well as made appointment for follow up.  Called pharmacy and updated information as well. No further questions from either.

## 2020-09-26 ENCOUNTER — Encounter: Payer: Self-pay | Admitting: Primary Care

## 2020-09-26 ENCOUNTER — Other Ambulatory Visit: Payer: Self-pay

## 2020-09-26 ENCOUNTER — Other Ambulatory Visit: Payer: Self-pay | Admitting: Primary Care

## 2020-09-26 ENCOUNTER — Ambulatory Visit: Payer: Medicare PPO | Admitting: Primary Care

## 2020-09-26 DIAGNOSIS — E039 Hypothyroidism, unspecified: Secondary | ICD-10-CM

## 2020-09-26 LAB — TSH: TSH: 2.24 u[IU]/mL (ref 0.35–4.50)

## 2020-09-26 LAB — BASIC METABOLIC PANEL
BUN: 8 mg/dL (ref 6–23)
CO2: 28 mEq/L (ref 19–32)
Calcium: 9.7 mg/dL (ref 8.4–10.5)
Chloride: 97 mEq/L (ref 96–112)
Creatinine, Ser: 0.77 mg/dL (ref 0.40–1.20)
GFR: 84.82 mL/min (ref >60.00)
Glucose, Bld: 111 mg/dL — ABNORMAL HIGH (ref 70–99)
Potassium: 4 mEq/L (ref 3.5–5.1)
Sodium: 135 mEq/L (ref 135–145)

## 2020-09-26 LAB — VITAMIN D 25 HYDROXY (VIT D DEFICIENCY, FRACTURES): VITD: 35.42 ng/mL (ref 30.00–100.00)

## 2020-09-26 MED ORDER — LEVOTHYROXINE SODIUM 50 MCG PO TABS: ORAL_TABLET | ORAL | 1 refills | Status: DC

## 2020-09-26 NOTE — Patient Instructions (Signed)
Stop by the lab prior to leaving today. I will notify you of your results once received.   Be sure to take your levothyroxine (thyroid medication) every morning on an empty stomach with water only. No food or other medications for 30 minutes. No heartburn medication, iron pills, calcium, vitamin D, or magnesium pills within four hours of taking levothyroxine.   We will see you in June/July 2022 for your physical.  It was a pleasure to see you today!

## 2020-09-26 NOTE — Progress Notes (Signed)
Subjective:    Patient ID: Jamie Patterson, female    DOB: 01-19-1961, 60 y.o.   MRN: [id number]  HPI  This visit occurred during the SARS-CoV-2 public health emergency.  Safety protocols were in place, including screening questions prior to the visit, additional usage of staff PPE, and extensive cleaning of exam room while observing appropriate contact time as indicated for disinfecting solutions.   [name] is a 60 year old female with a history of hypertension, seizure disorder, hyperlipidemia, anemia, B12 deficiency, tremor, abnormal TSH, prediabetes who presents today for follow up of hypothyroidism.  Newer diagnosis with TSh of 6.34 in December 2021 with repeat TSH of 11.01 in January 2022. Intermittent history of elevated TSH readings in the past. No prior diagnosis or treatment for thyroid disorder. During her last visit she endorsed chronic fatigue that had worsened. Negative TPO, low normal Free T4. She was initiated on levothyroxine 25 mg for 1-2 weeks, then increased to 50 mg thereafter.  Today she endorses feeling about the same. She was "sick" for about 2 weeks in mid January 2022. She suspects her fatigue is from her depression. She is following with psychiatry and therapy.   She is taking her levothyroxine in the morning on an empty stomach with water only, no other medications for 1 hour. She thinks she is taking her magnesium and vitamins within four hours. She has a family history of hyperparathyroid disease in her mother.   BP Readings from Last 3 Encounters:  09/26/20 120/78  07/12/20 112/88  06/26/20 135/88     Review of Systems  Constitutional: Positive for fatigue.  Respiratory: Negative for shortness of breath.   Cardiovascular: Negative for chest pain and palpitations.  Neurological: Positive for tremors. Negative for dizziness.       Past Medical History:  Diagnosis Date  . Amblyopia   . ARTHRITIS, TRAUMATIC, ANKLE 08/07/2009   Trimalleolar fracture on right  s/p fusion    . Benign essential tremor    worsened by anxiety  . Chronic pain due to injury    at R ankle due to ankle fracture  . Depression    followed by psych  . History of hypertension   . HLD (hyperlipidemia)   . Imbalance    likely multifactorial (s/p balance training at [facility] 08/2010) ?CMT  . Influenza A 09/15/2018  . Iron deficiency anemia   . LLL pneumonia 10/05/2018  . Right ankle pain 2003   trimalleolar fx s/p fusion  . Seizure (HCC)    focal, ex vacuo ventriculomegaly?, pending neuro w/u with sleep deprived EEG  . Syncope and collapse 08/19/2011     Social History   Socioeconomic History  . Marital status: Divorced    Spouse name: Not on file  . Number of children: 1  . Years of education: Not on file  . Highest education level: Not on file  Occupational History  . Occupation: Disabled    Comment: depression  . Occupation: DISABILITY    Employer: UNEMPLOYED  Tobacco Use  . Smoking status: Former Smoker    Packs/day: 1.00    Years: 20.00    Pack years: 20.00    Quit date: 08/12/1995    Years since quitting: 25.1  . Smokeless tobacco: Never Used  Vaping Use  . Vaping Use: Never used  Substance and Sexual Activity  . Alcohol use: No    Alcohol/week: 0.0 standard drinks    Comment: occassinal  . Drug use: No  . Sexual activity:  Not on file  Other Topics Concern  . Not on file  Social History Narrative   No caffeine. Divorced. 1 child. 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 Difficulty of Paying Living Expenses: Not hard at all  Food Insecurity: No Food Insecurity  . Worried About Programme researcher, broadcasting/film/video in the Last Year: Never true  . Ran Out of Food in the Last Year: Never true  Transportation Needs: No Transportation Needs  . Lack of Transportation (Medical): No  . Lack of  Transportation (Non-Medical): No  Physical Activity: Inactive  . Days of Exercise per Week: 0 days  . Minutes of Exercise per Session: 0 min  Stress: No Stress Concern Present  . Feeling of Stress : Not at all  Social Connections: Not on file  Intimate Partner Violence: Not At Risk  . Fear of Current or Ex-Partner: No  . Emotionally Abused: No  . Physically Abused: No  . Sexually Abused: No    Past Surgical History:  Procedure Laterality Date  . ANKLE FUSION  02/2004   Right-50% disability   . CARPAL TUNNEL RELEASE  12/2017   L CTR repair, R thumb A1 pulley release (Gramig)  . CESAREAN SECTION    . HAMMER TOE SURGERY  09/12/2019  . hospitalization  08/2011   syncope thought 2/2 seizure, MRI - Hyperintensity in the cerebral white matter bilaterally is nonspecific, started on Keppra  . ORIF ANKLE FRACTURE  06/2003   Right    Family History  Problem Relation Age of Onset  . Depression Mother   . Arrhythmia Mother 24       Dx with VTach after having sudden collapse, did NOT have h/o AMI's   . Atrial fibrillation Father   . Hypertension Father   . Osteoarthritis Father   . Aortic aneurysm Father   . Hepatitis Brother        Hep. C  . Anemia Sister     Allergies  Allergen Reactions  . Amlodipine Swelling    Ankle edema    Current Outpatient Medications on File Prior to Visit  Medication Sig Dispense Refill  . acetaminophen (TYLENOL) 500 MG tablet Take 500 mg by mouth as needed.    Marland Kitchen albuterol (VENTOLIN HFA) 108 (90 Base) MCG/ACT inhaler Inhale into the lungs every 6 (six) hours as needed.     . Ascorbic Acid (VITAMIN C) 1000 MG tablet Take 1,000 mg by mouth daily.    Marland Kitchen aspirin EC 81 MG tablet Take 81 mg by mouth every evening.    . Coenzyme Q10 (COQ-10 PO) Take by mouth.    . cycloSPORINE (RESTASIS) 0.05 % ophthalmic emulsion Place 1 drop into both eyes 2 (two) times daily.    . DULoxetine (CYMBALTA) 60 MG capsule Take 60 mg by mouth 2 (two) times daily.     Marland Kitchen  escitalopram (LEXAPRO) 10 MG tablet Take 10 mg by mouth daily.     . ferrous sulfate 325 (65 FE) MG tablet Take 325 mg by mouth daily with breakfast.    . gabapentin (NEURONTIN) 300 MG capsule Take 300 mg by mouth. Take 1 capsule in the morning and 2 capsules in the evening.    . hydrochlorothiazide (HYDRODIURIL) 25 MG tablet TAKE ONE TABLET BY MOUTH DAILY FOR BLOODPRESSURE 90 tablet 1  .  lamoTRIgine (LAMICTAL) 150 MG tablet TAKE 1 TABLET BY MOUTH TWICE A DAY 180 tablet 1  . levothyroxine (SYNTHROID) 50 MCG tablet Take 1 tablet by mouth every morning on an empty stomach with water only.  No food or other medications for 30 minutes. 30 tablet 0  . MAGNESIUM PO Take 400 mg by mouth 3 (three) times daily.     . Melatonin 10 MG TABS Take 2 tablets by mouth at bedtime.     . Multiple Vitamin (MULTIVITAMIN) tablet Take 1 tablet by mouth daily.    . naproxen sodium (ANAPROX) 220 MG tablet Take 440 mg by mouth as needed.    Marland Kitchen olmesartan (BENICAR) 20 MG tablet TAKE 1 TABLET BY MOUTH ONCE A DAY FOR BLOOD PRESSURE 90 tablet 3  . Omega-3 Fatty Acids (FISH OIL PO) Take 1 tablet by mouth daily.     . primidone (MYSOLINE) 50 MG tablet Take 1 tablet (50 mg total) by mouth in the morning and at bedtime. 180 tablet 2  . propranolol (INDERAL) 40 MG tablet TAKE 1 TABLET BY MOUTH ONCE DAILY 90 tablet 2  . rosuvastatin (CRESTOR) 10 MG tablet Take 1 tablet (10 mg total) by mouth every evening. For cholesterol. 90 tablet 3  . tiZANidine (ZANAFLEX) 2 MG tablet Take 2 mg by mouth at bedtime.    . vitamin B-12 (CYANOCOBALAMIN) 1000 MCG tablet Take 500 mcg by mouth daily.     No current facility-administered medications on file prior to visit.    BP 120/78   Pulse 89   Temp 98.1 F (36.7 C) (Temporal)   Ht 5\' 7"  (1.702 m)   Wt 232 lb (105.2 kg)   LMP 06/01/2013   SpO2 98%   BMI 36.34 kg/m    Objective:   Physical Exam Constitutional:      Appearance: She is well-nourished.  Neck:     Thyroid: No thyroid  mass, thyromegaly or thyroid tenderness.  Cardiovascular:     Rate and Rhythm: Normal rate and regular rhythm.  Pulmonary:     Effort: Pulmonary effort is normal.     Breath sounds: Normal breath sounds.  Musculoskeletal:     Cervical back: Neck supple.  Skin:    General: Skin is warm and dry.  Psychiatric:        Mood and Affect: Mood and affect normal.            Assessment & Plan:

## 2020-09-26 NOTE — Assessment & Plan Note (Addendum)
Long history of abnormal TSH, increased in December 2021 and January 2022.  She is taking magnesium and B12 within four hours of levothyroxine, discussed to separate at least four hours. Otherwise appears to be taking correctly.  Thyroid exam today benign.  Family history of hyperparathyroidism in mother. Repeat TSH, vitamin D, and BMP pending.  Continue levothyroxine 50 mcg for now.

## 2020-10-16 ENCOUNTER — Telehealth: Payer: Self-pay | Admitting: Primary Care

## 2020-10-16 NOTE — Telephone Encounter (Signed)
LVM for pt to rtn my call to R/S appt with NHA on 12/03/20

## 2020-10-17 ENCOUNTER — Encounter: Payer: Self-pay | Admitting: Neurology

## 2020-11-14 ENCOUNTER — Telehealth: Payer: Self-pay

## 2020-11-14 NOTE — Telephone Encounter (Signed)
Patient called in and stated that she has had a chronic cough over the past two years, but in the past 5 days it has gotten much worse. Patient stated that she has had to sleep in a chair since then and she has coughed so much she feels like she has strained a muscle. Patient denies fever, SOB, N/V/D, nasal congestion, exposure to COVID or a positive COVID test in the past two weeks. Patient stated that she has an appointment on 5/1 with pulmonology, but feels like she cannot wait much longer and wants relief. Patient has a VV with [facility] on 4/7 at 2:20. Instructed patient that if symptoms became worse to call back or report to UC/ED. Patient verbalized understanding.

## 2020-11-15 ENCOUNTER — Encounter: Payer: Self-pay | Admitting: Family Medicine

## 2020-11-15 ENCOUNTER — Other Ambulatory Visit: Payer: Self-pay

## 2020-11-15 ENCOUNTER — Telehealth (INDEPENDENT_AMBULATORY_CARE_PROVIDER_SITE_OTHER): Payer: Medicare PPO | Admitting: Family Medicine

## 2020-11-15 VITALS — BP 125/103 | HR 84 | Ht 67.0 in

## 2020-11-15 DIAGNOSIS — R053 Chronic cough: Secondary | ICD-10-CM

## 2020-11-15 MED ORDER — PREDNISONE 20 MG PO TABS: ORAL_TABLET | ORAL | 0 refills | Status: DC

## 2020-11-15 MED ORDER — ALBUTEROL SULFATE HFA 108 (90 BASE) MCG/ACT IN AERS: 2.0000 | INHALATION_SPRAY | Freq: Four times a day (QID) | RESPIRATORY_TRACT | 11 refills | Status: DC | PRN

## 2020-11-15 NOTE — Telephone Encounter (Signed)
noted 

## 2020-11-15 NOTE — Progress Notes (Signed)
Jamie Patterson T. Benno Brensinger, MD Primary Care and Sports Medicine [facility] at [facility] [address] [place], [address] Phone: [phone]  FAX: [phone]  Jamie Patterson - 60 y.o. female  MRN [id number]  Date of Birth: 1961-07-16  Visit Date: 11/15/2020  PCP: Doreene Nest, NP  Referred by: Doreene Nest, NP  Virtual Visit via Video Note:  I connected with  Jamie Patterson on 11/15/2020  2:20 PM EDT by a video enabled telemedicine application and verified that I am speaking with the correct person using two identifiers.   Location patient: home computer, tablet, or smartphone Location provider: work or home office Consent: Verbal consent directly obtained from Jamie Patterson. Persons participating in the virtual visit: patient, provider  I discussed the limitations of evaluation and management by telemedicine and the availability of in person appointments. The patient expressed understanding and agreed to proceed.  Chief Complaint  Patient presents with  . Cough    Chronic cough for several years.  Rib pain when coughing    History of Present Illness:  She has had a chronic cough for multiple years.  She does not have a definitive diagnosis.  She did have a 2018 spirometry at this was significant for some mild restrictive disease only.  She does have a Ventolin inhaler, but it is out of date.  She does cough daily, but over the last 2 weeks this has gotten worse particularly in the last 5 days.  She does have intermittent production of sputum.  Cough is so bad right now it is keeping her up much of the night  Smoker?  Not in 20 years  Note to [place]  Review of Systems as above: See pertinent positives and pertinent negatives per HPI No acute distress verbally   Observations/Objective/Exam:  An attempt was made to discern vital signs over the phone and per patient if applicable and possible.   General:    Alert, Oriented, appears well and in no  acute distress  Pulmonary:     On inspection no signs of respiratory distress.  Psych / Neurological:     Pleasant and cooperative.  Assessment and Plan:    ICD-10-CM   1. Chronic coughing  R05.3    This is very difficult to manage from a telehealth standpoint.  She has had worsening of her chronic cough etiology unknown.  I am going to give the patient a Ventolin inhaler and give her a dose of some steroids.  Hopefully pulmonology will help finding out a definitive cause and plan of care.  I am going to send a carbon copy of this note to Mrs. Clark.  I discussed the assessment and treatment plan with the patient. The patient was provided an opportunity to ask questions and all were answered. The patient agreed with the plan and demonstrated an understanding of the instructions.   The patient was advised to call back or seek an in-person evaluation if the symptoms worsen or if the condition fails to improve as anticipated.  Follow-up: prn unless noted otherwise below No follow-ups on file.  Meds ordered this encounter  Medications  . predniSONE (DELTASONE) 20 MG tablet    Sig: 2 tabs po daily for 5 days, then 1 tab po daily for 5 days    Dispense:  15 tablet    Refill:  0  . albuterol (VENTOLIN HFA) 108 (90 Base) MCG/ACT inhaler    Sig: Inhale 2 puffs  into the lungs every 6 (six) hours as needed for wheezing or shortness of breath.    Dispense:  8 each    Refill:  11   No orders of the defined types were placed in this encounter.   Signed,  Elpidio Galea. Griffin Dewilde, MD

## 2020-12-03 ENCOUNTER — Ambulatory Visit: Payer: Medicare PPO

## 2020-12-10 ENCOUNTER — Other Ambulatory Visit: Payer: Self-pay

## 2020-12-10 ENCOUNTER — Encounter: Payer: Self-pay | Admitting: Internal Medicine

## 2020-12-10 ENCOUNTER — Ambulatory Visit: Payer: Medicare PPO | Admitting: Internal Medicine

## 2020-12-10 VITALS — HR 86 | Ht 67.0 in | Wt 228.0 lb

## 2020-12-10 DIAGNOSIS — R053 Chronic cough: Secondary | ICD-10-CM

## 2020-12-10 DIAGNOSIS — R0602 Shortness of breath: Secondary | ICD-10-CM | POA: Diagnosis not present

## 2020-12-10 MED ORDER — PANTOPRAZOLE SODIUM 40 MG PO TBEC: 40.0000 mg | DELAYED_RELEASE_TABLET | Freq: Every day | ORAL | 5 refills | Status: DC

## 2020-12-10 NOTE — Progress Notes (Signed)
Jamie Patterson    [id number]    07/18/1961  Primary Care Physician:Clark, Keane Scrape, NP  Referring Physician: Doreene Nest, NP [address] [place],  [place] [address] Reason for Consultation: chronic cough Date of Consultation: 12/10/2020  Chief complaint:   Chief Complaint  Patient presents with  . Cough    4 years plus     HPI: Jamie Patterson is a 60 y.o. woman with chronic cough over 4 years with shortness of breath accompanying the cough over the last year. She coughs all day, outside, inside. Once it gets started, she has spells all day.   The cough does wake her up at night as well.   Cough is light brown and thick. Not bloody. She goes through a lot of kleenex disposing of sputum. She occasionally takes Delsym but it doesn't help. Certain cough drops also help her.   Previously was on valium and oxycodone, felt herself getting addicted. Quit cold [place].  Denies reflux/heart burn.  She has dry eyes, no itchy watery eyes, runny nose or sore throat.  She feels the cough never fully goes away, but   Had tried albuterol inhaler but doesn't think it helped the cough, but did help shortness of breath. She had a recent course of prednisone and didn't think the prednisone helped cough either.   Cough got worse after her pneumonia treated as an outpatient in 2020.  Cough worse with stress, being overheated, being upset.  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 Occupation: Disabled    Comment: depression  . Occupation: DISABILITY    Employer: UNEMPLOYED  Tobacco Use  . Smoking status: Former Smoker    Packs/day: 1.00    Years: 20.00    Pack years: 20.00    Quit date: 08/12/1995    Years since quitting: 25.3  . Smokeless tobacco: Never Used  Vaping Use  . Vaping Use: Never used  Substance and Sexual  Activity  . Alcohol use: No    Alcohol/week: 0.0 standard drinks    Comment: occassinal  . Drug use: No  . Sexual activity: Not on file    Relevant family history:  Family History  Problem Relation Age of Onset  . Depression Mother   . Arrhythmia Mother 73       Dx with VTach after having sudden collapse, did NOT have h/o AMI's   . Atrial fibrillation Father   . Hypertension Father   . Osteoarthritis Father   . Aortic aneurysm Father   . Hepatitis Brother        Hep. C  . Anemia Sister     Past Medical History:  Diagnosis Date  . Amblyopia   . ARTHRITIS, TRAUMATIC, ANKLE 08/07/2009   Trimalleolar fracture on right s/p fusion    . Benign essential tremor    worsened by anxiety  . Chronic pain due to injury    at R ankle due to ankle fracture  . Depression    followed by psych  . History of hypertension   . HLD (hyperlipidemia)   . Imbalance    likely multifactorial (s/p balance training at [facility] 08/2010) ?CMT  . Influenza A 09/15/2018  . Iron deficiency anemia   . LLL pneumonia 10/05/2018  . Right ankle pain 2003   trimalleolar fx  s/p fusion  . Seizure (HCC)    focal, ex vacuo ventriculomegaly?, pending neuro w/u with sleep deprived EEG  . Syncope and collapse 08/19/2011    Past Surgical History:  Procedure Laterality Date  . ANKLE FUSION  02/2004   Right-50% disability   . CARPAL TUNNEL RELEASE  12/2017   L CTR repair, R thumb A1 pulley release (Gramig)  . CESAREAN SECTION    . HAMMER TOE SURGERY  09/12/2019  . hospitalization  08/2011   syncope thought 2/2 seizure, MRI - Hyperintensity in the cerebral white matter bilaterally is nonspecific, started on Keppra  . ORIF ANKLE FRACTURE  06/2003   Right     Physical Exam: Pulse 86, height 5\' 7"  (1.702 m), weight 228 lb (103.4 kg), last menstrual period 06/01/2013, SpO2 98 %. Gen:      No acute distress ENT:  mallampati III, mild cobblestoning, no nasal polyps, mucus membranes moist Lungs:    No increased  respiratory effort, symmetric chest wall excursion, clear to auscultation bilaterally, no wheezes or crackles CV:         Regular rate and rhythm; no murmurs, rubs, or gallops.  No pedal edema Abd:      + bowel sounds; soft, non-tender; no distension MSK: no acute synovitis of DIP or PIP joints, no mechanics hands.  Skin:      Warm and dry; no rashes Neuro: normal speech, no focal facial asymmetry Psych: alert and oriented x3, normal mood and affect   Data Reviewed/Medical Decision Making:  Independent interpretation of tests: Imaging: . Review of patient's chest xray Feb 2020 images revealed no acute cardiopulmonary process. The patient's images have been independently reviewed by me.    PFTs: Spirometry 2018 is normal.   Labs:  Lab Results  Component Value Date   WBC 5.1 02/15/2020   HGB 12.8 02/15/2020   HCT 37.6 02/15/2020   MCV 95.9 02/15/2020   PLT 290 02/15/2020   Lab Results  Component Value Date   NA 135 09/26/2020   K 4.0 09/26/2020   CL 97 09/26/2020   CO2 28 09/26/2020     Immunization status:  Immunization History  Administered Date(s) Administered  . Influenza Split 08/19/2012  . Influenza,inj,Quad PF,6+ Mos 05/04/2015, 09/04/2018, 04/07/2019, 08/14/2020  . PFIZER(Purple Top)SARS-COV-2 Vaccination 10/31/2019, 11/21/2019  . Pneumococcal Conjugate-13 05/04/2015  . Tdap 08/28/2011  . Zoster Recombinat (Shingrix) 04/13/2020    . I reviewed prior external note(s) from Dr. 06/13/2020 . I reviewed the result(s) of the labs and imaging as noted above.  . I have ordered PFT    Assessment:  Chronic cough Shortness of breath  Plan/Recommendations: Differential includes GERD, PND, neurogenic cough.  Trial of PPI once daily Will obtain a full set of PFTs for dyspnea. Consider smoking related lung disease, chest wall restriction.    Return to Care: Return in about 4 weeks (around 01/07/2021).  01/09/2021, MD Pulmonary and Critical Care Medicine [facility] Office:[phone]  CC: Durel Salts, NP

## 2020-12-10 NOTE — Patient Instructions (Addendum)
The patient should have follow up scheduled with myself in 1 months.   Prior to next visit patient should have: Full set of PFTs - one hour appt with me after  Start taking protonix once a day to see if it helps your cough.   What is GERD? Gastroesophageal reflux disease (GERD) is gastroesophageal reflux diseasewhich occurs when the lower esophageal sphincter (LES) opens spontaneously, for varying periods of time, or does not close properly and stomach contents rise up into the esophagus. GER is also called acid reflux or acid regurgitation, because digestive juices--called acids--rise up with the food. The esophagus is the tube that carries food from the mouth to the stomach. The LES is a ring of muscle at the bottom of the esophagus that acts like a valve between the esophagus and stomach.  When acid reflux occurs, food or fluid can be tasted in the back of the mouth. When refluxed stomach acid touches the lining of the esophagus it may cause a burning sensation in the chest or throat called heartburn or acid indigestion. Occasional reflux is common. Persistent reflux that occurs more than twice a week is considered GERD, and it can eventually lead to more serious health problems. People of all ages can have GERD. Studies have shown that GERD may worsen or contribute to asthma, chronic cough, and pulmonary fibrosis.   What are the symptoms of GERD? The main symptom of GERD in adults is frequent heartburn, also called acid indigestion--burning-type pain in the lower part of the mid-chest, behind the breast bone, and in the mid-abdomen.  Not all reflux is acidic in nature, and many patients don't have heart burn at all. Sometimes it feels like a cough (either dry or with mucus), choking sensation, asthma, shortness of breath, waking up at night, frequent throat clearing, or trouble swallowing.    What causes GERD? The reason some people develop GERD is still unclear. However, research shows that in  people with GERD, the LES relaxes while the rest of the esophagus is working. Anatomical abnormalities such as a hiatal hernia may also contribute to GERD. A hiatal hernia occurs when the upper part of the stomach and the LES move above the diaphragm, the muscle wall that separates the stomach from the chest. Normally, the diaphragm helps the LES keep acid from rising up into the esophagus. When a hiatal hernia is present, acid reflux can occur more easily. A hiatal hernia can occur in people of any age and is most often a normal finding in otherwise healthy people over age 5. Most of the time, a hiatal hernia produces no symptoms.   Other factors that may contribute to GERD include - Obesity or recent weight gain - Pregnancy  - Smoking  - Diet - Certain medications  Common foods that can worsen reflux symptoms include: - carbonated beverages - artificial sweeteners - citrus fruits  - chocolate  - drinks with caffeine or alcohol  - fatty and fried foods  - garlic and onions  - mint flavorings  - spicy foods  - tomato-based foods, like spaghetti sauce, salsa, chili, and pizza   Lifestyle Changes If you smoke, stop.  Avoid foods and beverages that worsen symptoms (see above.) Lose weight if needed.  Eat small, frequent meals.  Wear loose-fitting clothes.  Avoid lying down for 3 hours after a meal.  Raise the head of your bed 6 to 8 inches by securing wood blocks under the bedposts. Just using extra pillows will not help,  but using a wedge-shaped pillow may be helpful.  Medications  H2 blockers, such as cimetidine (Tagamet HB), famotidine (Pepcid AC), nizatidine (Axid AR), and ranitidine (Zantac 75), decrease acid production. They are available in prescription strength and over-the-counter strength. These drugs provide short-term relief and are effective for about half of those who have GERD symptoms.  Proton pump inhibitors include omeprazole (Prilosec, Zegerid), lansoprazole  (Prevacid), pantoprazole (Protonix), rabeprazole (Aciphex), and esomeprazole (Nexium), which are available by prescription. Prilosec is also available in over-the-counter strength. Proton pump inhibitors are more effective than H2 blockers and can relieve symptoms and heal the esophageal lining in almost everyone who has GERD.  Because drugs work in different ways, combinations of medications may help control symptoms. People who get heartburn after eating may take both antacids and H2 blockers. The antacids work first to neutralize the acid in the stomach, and then the H2 blockers act on acid production. By the time the antacid stops working, the H2 blocker will have stopped acid production. Your health care provider is the best source of information about how to use medications for GERD.   Points to Remember 1. You can have GERD without having heartburn. Your symptoms could include a dry cough, asthma symptoms, or trouble swallowing.  2. Taking medications daily as prescribed is important in controlling you symptoms.  Sometimes it can take up to 8 weeks to fully achieve the effects of the medications prescribed.  3. Coughing related to GERD can be difficult to treat and is very frustrating!  However, it is important to stick with these medications and lifestyle modifications before pursuing more aggressive or invasive test and treatments.

## 2020-12-13 ENCOUNTER — Telehealth: Payer: Self-pay | Admitting: Internal Medicine

## 2020-12-13 DIAGNOSIS — Z01812 Encounter for preprocedural laboratory examination: Secondary | ICD-10-CM

## 2020-12-13 NOTE — Telephone Encounter (Signed)
Called and spoke to pt. Pt is needing a covid test prior to her PFT on 6/6. Pt wants to schedule her COVID test with her PCP as it close to her home. Order placed for a covid test to be done at PCP. Called [facility] and was told they have the order. Called pt back and informed her they confirmed having the order and to call back if there are any issues when she goes to have the lab drawn. Pt verbalized understanding and denied any further questions or concerns at this time.

## 2020-12-26 ENCOUNTER — Other Ambulatory Visit: Payer: Self-pay | Admitting: Primary Care

## 2021-01-02 ENCOUNTER — Ambulatory Visit: Payer: Medicare PPO

## 2021-01-03 ENCOUNTER — Other Ambulatory Visit: Payer: Self-pay | Admitting: Primary Care

## 2021-01-04 ENCOUNTER — Other Ambulatory Visit: Payer: Self-pay | Admitting: Primary Care

## 2021-01-04 DIAGNOSIS — I10 Essential (primary) hypertension: Secondary | ICD-10-CM

## 2021-01-04 DIAGNOSIS — R053 Chronic cough: Secondary | ICD-10-CM

## 2021-01-04 DIAGNOSIS — E7800 Pure hypercholesterolemia, unspecified: Secondary | ICD-10-CM

## 2021-01-11 ENCOUNTER — Other Ambulatory Visit: Payer: Medicare PPO

## 2021-01-11 DIAGNOSIS — Z20822 Contact with and (suspected) exposure to covid-19: Secondary | ICD-10-CM | POA: Diagnosis not present

## 2021-01-11 DIAGNOSIS — R053 Chronic cough: Secondary | ICD-10-CM | POA: Diagnosis not present

## 2021-01-11 DIAGNOSIS — Z01812 Encounter for preprocedural laboratory examination: Secondary | ICD-10-CM | POA: Diagnosis not present

## 2021-01-12 LAB — NOVEL CORONAVIRUS, NAA: SARS-CoV-2, NAA: NOT DETECTED

## 2021-01-12 LAB — SARS-COV-2, NAA 2 DAY TAT

## 2021-01-14 ENCOUNTER — Other Ambulatory Visit: Payer: Self-pay

## 2021-01-14 ENCOUNTER — Encounter: Payer: Self-pay | Admitting: Internal Medicine

## 2021-01-14 ENCOUNTER — Ambulatory Visit (INDEPENDENT_AMBULATORY_CARE_PROVIDER_SITE_OTHER): Payer: Medicare PPO | Admitting: Internal Medicine

## 2021-01-14 VITALS — BP 138/86 | HR 87 | Temp 98.2°F | Ht 66.0 in | Wt 230.0 lb

## 2021-01-14 DIAGNOSIS — R053 Chronic cough: Secondary | ICD-10-CM | POA: Diagnosis not present

## 2021-01-14 DIAGNOSIS — K219 Gastro-esophageal reflux disease without esophagitis: Secondary | ICD-10-CM | POA: Diagnosis not present

## 2021-01-14 LAB — PULMONARY FUNCTION TEST
DL/VA % pred: 98 %
DL/VA: 4.08 ml/min/mmHg/L
DLCO cor % pred: 91 %
DLCO cor: 20.62 ml/min/mmHg
DLCO unc % pred: 91 %
DLCO unc: 20.62 ml/min/mmHg
FEF 25-75 Post: 1.23 L/sec
FEF 25-75 Pre: 1.49 L/sec
FEF2575-%Change-Post: -17 %
FEF2575-%Pred-Post: 48 %
FEF2575-%Pred-Pre: 57 %
FEV1-%Change-Post: -7 %
FEV1-%Pred-Post: 67 %
FEV1-%Pred-Pre: 72 %
FEV1-Post: 1.94 L
FEV1-Pre: 2.09 L
FEV1FVC-%Change-Post: -1 %
FEV1FVC-%Pred-Pre: 92 %
FEV6-%Change-Post: -6 %
FEV6-%Pred-Post: 75 %
FEV6-%Pred-Pre: 80 %
FEV6-Post: 2.72 L
FEV6-Pre: 2.9 L
FEV6FVC-%Change-Post: 0 %
FEV6FVC-%Pred-Post: 103 %
FEV6FVC-%Pred-Pre: 103 %
FVC-%Change-Post: -6 %
FVC-%Pred-Post: 73 %
FVC-%Pred-Pre: 78 %
FVC-Post: 2.72 L
FVC-Pre: 2.9 L
Post FEV1/FVC ratio: 71 %
Post FEV6/FVC ratio: 100 %
Pre FEV1/FVC ratio: 72 %
Pre FEV6/FVC Ratio: 100 %
RV % pred: 123 %
RV: 2.61 L
TLC % pred: 105 %
TLC: 5.8 L

## 2021-01-14 MED ORDER — PANTOPRAZOLE SODIUM 40 MG PO TBEC: 40.0000 mg | DELAYED_RELEASE_TABLET | Freq: Every day | ORAL | 3 refills | Status: DC

## 2021-01-14 NOTE — Patient Instructions (Signed)
Full PFT performed today. °

## 2021-01-14 NOTE — Progress Notes (Signed)
Full PFT performed today. °

## 2021-01-14 NOTE — Progress Notes (Signed)
Jamie Patterson    [id number]    26-Nov-1960  Primary Care Physician:Clark, Keane Scrape, NP Date of Appointment: 01/14/2021 Established Patient Visit  Chief complaint:   Chief Complaint  Patient presents with  . Follow-up     HPI: Jamie Patterson is a 60 y.o. woman with chronic cough.  Interval Updates: Here for follow up after PFTs. Started PPI last time and feels nighttime cough has improved. The cough symptoms vary from month to month. She reports always being under stress and anxiety - and that is why she is disabled.  She is unable to exercise much because of her ankle. She spends most of her time indoors with her dog.  Reports good support system with her spouse.   Cough did not improve with prednisone or inhalers.  Nighttime cough has improved with PPI, she gets out of bed less frequently now, only 1-2 times/week. Has been on this for 4 weeks so far.  No significant rhinitis.   Currently on neurontin 300 in the morning, 600 at night. Supposed to be on 300 mg 4 times/day as needed and 600 at night per psychiatry.hasn't been taking this way. Wonders if it is causing tremor.   I have reviewed the patient's family social and past medical history and updated as appropriate.   Past Medical History:  Diagnosis Date  . Amblyopia   . ARTHRITIS, TRAUMATIC, ANKLE 08/07/2009   Trimalleolar fracture on right s/p fusion    . Benign essential tremor    worsened by anxiety  . Chronic pain due to injury    at R ankle due to ankle fracture  . Depression    followed by psych  . History of hypertension   . HLD (hyperlipidemia)   . Imbalance    likely multifactorial (s/p balance training at [facility] 08/2010) ?CMT  . Influenza A 09/15/2018  . Iron deficiency anemia   . LLL pneumonia 10/05/2018  . Right ankle pain 2003   trimalleolar fx s/p fusion  . Seizure (HCC)    focal, ex vacuo ventriculomegaly?, pending neuro w/u with sleep deprived EEG  . Syncope and collapse 08/19/2011    Past  Surgical History:  Procedure Laterality Date  . ANKLE FUSION  02/2004   Right-50% disability   . CARPAL TUNNEL RELEASE  12/2017   L CTR repair, R thumb A1 pulley release (Gramig)  . CESAREAN SECTION    . HAMMER TOE SURGERY  09/12/2019  . hospitalization  08/2011   syncope thought 2/2 seizure, MRI - Hyperintensity in the cerebral white matter bilaterally is nonspecific, started on Keppra  . ORIF ANKLE FRACTURE  06/2003   Right    Family History  Problem Relation Age of Onset  . Depression Mother   . Arrhythmia Mother 67       Dx with VTach after having sudden collapse, did NOT have h/o AMI's   . Atrial fibrillation Father   . Hypertension Father   . Osteoarthritis Father   . Aortic aneurysm Father   . Hepatitis Brother        Hep. C  . Anemia Sister     Social History   Occupational History  . Occupation: Disabled    Comment: depression  . Occupation: DISABILITY    Employer: UNEMPLOYED  Tobacco Use  . Smoking status: Former Smoker    Packs/day: 1.00    Years: 20.00    Pack years: 20.00    Quit date: 08/12/1995  Years since quitting: 25.4  . Smokeless tobacco: Never Used  Vaping Use  . Vaping Use: Never used  Substance and Sexual Activity  . Alcohol use: No    Alcohol/week: 0.0 standard drinks    Comment: occassinal  . Drug use: No  . Sexual activity: Not on file     Physical Exam: Blood pressure 138/86, pulse 87, temperature 98.2 F (36.8 C), temperature source Temporal, height 5\' 6"  (1.676 m), weight 230 lb (104.3 kg), last menstrual period 06/01/2013, SpO2 94 %.  Gen:      No acute distress ENT:  no nasal polyps, mucus membranes moist Lungs:    No increased respiratory effort, symmetric chest wall excursion, clear to auscultation bilaterally, no wheezes or crackles CV:         Regular rate and rhythm; no murmurs, rubs, or gallops.  No pedal edema Psych: anxious affect  Data Reviewed: Imaging:  PFTs:  PFT Results Latest Ref Rng & Units 01/14/2021   FVC-Pre L 2.90  FVC-Predicted Pre % 78  FVC-Post L 2.72  FVC-Predicted Post % 73  Pre FEV1/FVC % % 72  Post FEV1/FCV % % 71  FEV1-Pre L 2.09  FEV1-Predicted Pre % 72  FEV1-Post L 1.94  DLCO uncorrected ml/min/mmHg 20.62  DLCO UNC% % 91  DLCO corrected ml/min/mmHg 20.62  DLCO COR %Predicted % 91  DLVA Predicted % 98  TLC L 5.80  TLC % Predicted % 105  RV % Predicted % 123   I have personally reviewed the patient's PFTs and they show normal pulmonary function.   Labs:  Immunization status: Immunization History  Administered Date(s) Administered  . Influenza Split 08/19/2012  . Influenza,inj,Quad PF,6+ Mos 05/04/2015, 09/04/2018, 04/07/2019, 08/14/2020  . PFIZER(Purple Top)SARS-COV-2 Vaccination 10/31/2019, 11/21/2019  . Pneumococcal Conjugate-13 05/04/2015  . Tdap 08/28/2011  . Zoster Recombinat (Shingrix) 04/13/2020    Assessment:  Chronic Cough - pfts normal. Doubt asthma given no improvement with steroids, albuterol.  GERD - improved on PPI    Plan/Recommendations: Discussed diet and lifestyle modifications for reflux.  Increase gabapentin to TID ( 300 in am and afternoon, 600 at night) for treatment of neurogenic cough. Continue protonix for reflux.   Return to Care: Return in about 6 months (around 07/16/2021).   14/01/2021, MD Pulmonary and Critical Care Medicine [facility] [phone]

## 2021-01-14 NOTE — Patient Instructions (Signed)
The patient should have follow up scheduled with myself in 6 months.    Continue protonix daily for your reflux. This is a big part of your cough.    Increase gabapentin to to help with your cough. 300 mg in the morning, 300 mg in the afternoon, and 600 mg at night.

## 2021-01-21 DIAGNOSIS — L237 Allergic contact dermatitis due to plants, except food: Secondary | ICD-10-CM | POA: Diagnosis not present

## 2021-01-31 ENCOUNTER — Telehealth: Payer: Self-pay

## 2021-01-31 NOTE — Telephone Encounter (Signed)
What about a virtual visit? Or more information... like are there a few spots to her extremities or is it more widespread?   I'd like to avoid another course of oral steroids if possible, but could provide a higher potency steroid ointment. The ointment cannot be used on the face.

## 2021-01-31 NOTE — Telephone Encounter (Signed)
Pt called triage stating she went to [facility] 01-21-21 (notes in chart) for [facility]. She was given prednisone. York Spaniel it has cleared from her face but that it is still on her arms and leg. She is taking Benadryl daily and using cortisone cream with slight relief. She is asking if something can be called in or does she need more prednisone. Please advise. She uses [facility].

## 2021-01-31 NOTE — Telephone Encounter (Signed)
I need a better idea of how severe. Can she attach pics on MyChart?   Is she using the triamcinolone 0.1% ointment that was prescribed?  What date did she finish her prednisone?

## 2021-01-31 NOTE — Telephone Encounter (Signed)
Patient is not able to attach pictures in the past but will try . She has been using the triamcinolone ointment. She finished her prednisone on 6/18.

## 2021-02-01 DIAGNOSIS — H16223 Keratoconjunctivitis sicca, not specified as Sjogren's, bilateral: Secondary | ICD-10-CM | POA: Diagnosis not present

## 2021-02-01 DIAGNOSIS — H5213 Myopia, bilateral: Secondary | ICD-10-CM | POA: Diagnosis not present

## 2021-02-01 DIAGNOSIS — H35373 Puckering of macula, bilateral: Secondary | ICD-10-CM | POA: Diagnosis not present

## 2021-02-01 DIAGNOSIS — H04123 Dry eye syndrome of bilateral lacrimal glands: Secondary | ICD-10-CM | POA: Diagnosis not present

## 2021-02-01 DIAGNOSIS — H524 Presbyopia: Secondary | ICD-10-CM | POA: Diagnosis not present

## 2021-02-01 NOTE — Telephone Encounter (Signed)
Called patient has had improvement after we talked yesterday. She is not having issues at all right now. If symptoms come back or return she will let our office. Know.

## 2021-02-12 NOTE — Progress Notes (Deleted)
Assessment/Plan:    1.  Essential Tremor, exacerbated by GAD             -increase primidone, 50 mg bid             -Continue propranolol, 40 mg.  Did not want to increase this because of history of depression 2.  Chronic migraine/chronic daily headache  -okay right now 3.  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 [name] 4.  Hypertension             -Patient was on multiple antihypertensives, but amlodipine started causing swelling and lisinopril cough, so those were recently held and she is on hydrochlorothiazide right now and olmesartan.  This is in addition to the propranolol I have her on for tremor.   5.  Cough  -Following with pulmonary.  They feel that some is due to reflux.  They are also treating her gabapentin for "neurogenic cough." Subjective:   Jamie Patterson was seen today in follow up for essential tremor.  My previous records were reviewed prior to todays visit.  I slightly increased her primidone last visit, in addition to her propranolol that she was already on.  She reports that ***. last visit, I really wanted the patient to follow-up with pulmonary because of her coughing and shortness of breath.  She has done that.  She is following with Dr. Celine Mans.  She last saw Dr. Celine Mans on June 6.  There was some improvement in the nighttime cough with PPI.  I noted that Dr. Celine Mans increased the patient's gabapentin to 300 mg in the morning, 300 mg in the afternoon and 600 mg at night for neurogenic cough.  Current prescribed movement disorder medications: Primidone, 50 mg twice daily (increased last visit) Propranolol, 40 mg daily (She is already on gabapentin, 300/300/600 for cough)   ALLERGIES:   Allergies  Allergen Reactions   Amlodipine Swelling    Ankle edema    CURRENT MEDICATIONS:  Outpatient Encounter Medications as of 02/26/2021   Medication Sig   acetaminophen (TYLENOL) 500 MG tablet Take 500 mg by mouth as needed.   albuterol (VENTOLIN HFA) 108 (90 Base) MCG/ACT inhaler Inhale 2 puffs into the lungs every 6 (six) hours as needed for wheezing or shortness of breath.   Ascorbic Acid (VITAMIN C) 1000 MG tablet Take 1,000 mg by mouth daily.   aspirin EC 81 MG tablet Take 81 mg by mouth every evening.   Coenzyme Q10 (COQ-10 PO) Take by mouth.   cycloSPORINE (RESTASIS) 0.05 % ophthalmic emulsion Place 1 drop into both eyes 2 (two) times daily.   DULoxetine (CYMBALTA) 60 MG capsule Take 60 mg by mouth 2 (two) times daily.    escitalopram (LEXAPRO) 10 MG tablet Take 10 mg by mouth daily.    ferrous sulfate 325 (65 FE) MG tablet Take 325 mg by mouth daily with breakfast.   gabapentin (NEURONTIN) 300 MG capsule Take 300 mg by mouth. Take 1 capsule in the morning and 2 capsules in the evening.   hydrochlorothiazide (HYDRODIURIL) 25 MG tablet TAKE ONE TABLET BY MOUTH DAILY FOR BLOODPRESSURE   lamoTRIgine (LAMICTAL) 150 MG tablet TAKE 1 TABLET BY MOUTH TWICE A DAY   levothyroxine (SYNTHROID) 50 MCG tablet Take 1 tablet by mouth every morning  on an empty stomach with water only.  No food or other medications for 30 minutes.   MAGNESIUM PO Take 400 mg by mouth 3 (three) times daily.    Melatonin 10 MG TABS Take 2 tablets by mouth at bedtime.    montelukast (SINGULAIR) 10 MG tablet TAKE 1 TABLET BY MOUTH AT BEDTIME. FOR COUGH AND ALLERGIES   Multiple Vitamin (MULTIVITAMIN) tablet Take 1 tablet by mouth daily.   naproxen sodium (ANAPROX) 220 MG tablet Take 440 mg by mouth as needed.   olmesartan (BENICAR) 20 MG tablet TAKE 1 TABLET BY MOUTH ONCE A DAY FOR BLOOD PRESSURE   Omega-3 Fatty Acids (FISH OIL PO) Take 1 tablet by mouth daily.    pantoprazole (PROTONIX) 40 MG tablet Take 1 tablet (40 mg total) by mouth daily.   predniSONE (DELTASONE) 20 MG tablet 2 tabs po daily for 5 days, then 1 tab po daily for 5 days   primidone  (MYSOLINE) 50 MG tablet Take 1 tablet (50 mg total) by mouth in the morning and at bedtime.   propranolol (INDERAL) 40 MG tablet TAKE 1 TABLET BY MOUTH ONCE DAILY   rosuvastatin (CRESTOR) 10 MG tablet TAKE 1 TABLET BY MOUTH ONCE EVERY EVENING FOR CHOLESTEROL   tiZANidine (ZANAFLEX) 2 MG tablet Take 2 mg by mouth at bedtime.   vitamin B-12 (CYANOCOBALAMIN) 1000 MCG tablet Take 500 mcg by mouth daily.   No facility-administered encounter medications on file as of 02/26/2021.     Objective:    PHYSICAL EXAMINATION:    VITALS:   There were no vitals filed for this visit.   GEN:  The patient appears stated age and is in NAD. HEENT:  Normocephalic, atraumatic.  The mucous membranes are moist. The superficial temporal arteries are without ropiness or tenderness. CV:  RRR Lungs:  Rare exp wheezes.  Some pursed lip breathing.  Some DOE.   Neck/HEME:  There are no carotid bruits bilaterally.  Neurological examination:  Orientation: The patient is alert and oriented x3. Cranial nerves: There is good facial symmetry. The speech is fluent and clear. Soft palate rises symmetrically and there is no tongue deviation. Hearing is intact to conversational tone. Sensation: Sensation is intact to light touch throughout Motor: Strength is at least antigravity x4.  Movement examination: Tone: There is normal tone in the UE/LE Abnormal movements: mild postural tremor bilaterally.  Mild trouble with archimedes spiral bilaterally Coordination:  There is no decremation with RAM's Gait and Station: The patient has no difficulty arising out of a deep-seated chair without the use of the hands. The patient's stride length is good I have reviewed and interpreted the following labs independently   Chemistry      Component Value Date/Time   NA 135 09/26/2020 1045   K 4.0 09/26/2020 1045   CL 97 09/26/2020 1045   CO2 28 09/26/2020 1045   BUN 8 09/26/2020 1045   CREATININE 0.77 09/26/2020 1045   CREATININE  0.67 02/16/2018 1332      Component Value Date/Time   CALCIUM 9.7 09/26/2020 1045   ALKPHOS 168 (H) 01/31/2020 0805   AST 22 01/31/2020 0805   ALT 24 01/31/2020 0805   BILITOT 0.3 01/31/2020 0805      Lab Results  Component Value Date   WBC 5.1 02/15/2020   HGB 12.8 02/15/2020   HCT 37.6 02/15/2020   MCV 95.9 02/15/2020   PLT 290 02/15/2020   Lab Results  Component Value Date   TSH 2.24 09/26/2020  Chemistry      Component Value Date/Time   NA 135 09/26/2020 1045   K 4.0 09/26/2020 1045   CL 97 09/26/2020 1045   CO2 28 09/26/2020 1045   BUN 8 09/26/2020 1045   CREATININE 0.77 09/26/2020 1045   CREATININE 0.67 02/16/2018 1332      Component Value Date/Time   CALCIUM 9.7 09/26/2020 1045   ALKPHOS 168 (H) 01/31/2020 0805   AST 22 01/31/2020 0805   ALT 24 01/31/2020 0805   BILITOT 0.3 01/31/2020 0805         Total time spent on today's visit was *** minutes, including both face-to-face time and nonface-to-face time.  Time included that spent on review of records (prior notes available to me/labs/imaging if pertinent), discussing treatment and goals, answering patient's questions and coordinating care.  Cc:  Doreene Nest, NP

## 2021-02-19 ENCOUNTER — Encounter: Payer: Medicare PPO | Admitting: Primary Care

## 2021-02-26 ENCOUNTER — Ambulatory Visit: Payer: Medicare PPO | Admitting: Neurology

## 2021-03-14 ENCOUNTER — Other Ambulatory Visit: Payer: Self-pay

## 2021-03-14 ENCOUNTER — Encounter: Payer: Self-pay | Admitting: Primary Care

## 2021-03-14 ENCOUNTER — Ambulatory Visit (INDEPENDENT_AMBULATORY_CARE_PROVIDER_SITE_OTHER): Payer: Medicare PPO | Admitting: Primary Care

## 2021-03-14 VITALS — BP 120/82 | HR 83 | Temp 96.8°F | Ht 66.0 in | Wt 224.0 lb

## 2021-03-14 DIAGNOSIS — I10 Essential (primary) hypertension: Secondary | ICD-10-CM | POA: Diagnosis not present

## 2021-03-14 DIAGNOSIS — E039 Hypothyroidism, unspecified: Secondary | ICD-10-CM | POA: Diagnosis not present

## 2021-03-14 DIAGNOSIS — R251 Tremor, unspecified: Secondary | ICD-10-CM

## 2021-03-14 DIAGNOSIS — Z0000 Encounter for general adult medical examination without abnormal findings: Secondary | ICD-10-CM

## 2021-03-14 DIAGNOSIS — R053 Chronic cough: Secondary | ICD-10-CM | POA: Diagnosis not present

## 2021-03-14 DIAGNOSIS — R7303 Prediabetes: Secondary | ICD-10-CM

## 2021-03-14 DIAGNOSIS — F331 Major depressive disorder, recurrent, moderate: Secondary | ICD-10-CM | POA: Diagnosis not present

## 2021-03-14 DIAGNOSIS — E7800 Pure hypercholesterolemia, unspecified: Secondary | ICD-10-CM | POA: Diagnosis not present

## 2021-03-14 DIAGNOSIS — G40909 Epilepsy, unspecified, not intractable, without status epilepticus: Secondary | ICD-10-CM | POA: Diagnosis not present

## 2021-03-14 LAB — LIPID PANEL
Cholesterol: 202 mg/dL — ABNORMAL HIGH (ref 0–200)
HDL: 74.9 mg/dL (ref >39.00)
NonHDL: 126.75
Total CHOL/HDL Ratio: 3
Triglycerides: 202 mg/dL — ABNORMAL HIGH (ref 0.0–149.0)
VLDL: 40.4 mg/dL — ABNORMAL HIGH (ref 0.0–40.0)

## 2021-03-14 LAB — COMPREHENSIVE METABOLIC PANEL
ALT: 40 U/L — ABNORMAL HIGH (ref 0–35)
AST: 38 U/L — ABNORMAL HIGH (ref 0–37)
Albumin: 4.6 g/dL (ref 3.5–5.2)
Alkaline Phosphatase: 99 U/L (ref 39–117)
BUN: 6 mg/dL (ref 6–23)
CO2: 28 mEq/L (ref 19–32)
Calcium: 10 mg/dL (ref 8.4–10.5)
Chloride: 96 mEq/L (ref 96–112)
Creatinine, Ser: 0.81 mg/dL (ref 0.40–1.20)
GFR: 79.56 mL/min (ref >60.00)
Glucose, Bld: 115 mg/dL — ABNORMAL HIGH (ref 70–99)
Potassium: 4.3 mEq/L (ref 3.5–5.1)
Sodium: 133 mEq/L — ABNORMAL LOW (ref 135–145)
Total Bilirubin: 0.4 mg/dL (ref 0.2–1.2)
Total Protein: 7 g/dL (ref 6.0–8.3)

## 2021-03-14 LAB — HEMOGLOBIN A1C: Hgb A1c MFr Bld: 5.8 % (ref 4.6–6.5)

## 2021-03-14 LAB — TSH: TSH: 3.39 u[IU]/mL (ref 0.35–5.50)

## 2021-03-14 LAB — LDL CHOLESTEROL, DIRECT: Direct LDL: 90 mg/dL

## 2021-03-14 NOTE — Progress Notes (Signed)
Subjective:    Patient ID: Jamie Patterson, female    DOB: 10-Nov-1960, 60 y.o.   MRN: [id number]  HPI  Jamie Patterson is a very pleasant 60 y.o. female who presents today for complete physical and follow up of chronic conditions.  Immunizations: -Tetanus: 2013 -Influenza: due this season  -Covid-19: 2 vaccines -Shingles: One shingles dose, will get second vaccine at the pharmacy -Pneumonia: Prevnar 13 in 2016   Diet: Fair diet.  Exercise: No regular exercise.  Eye exam: Completes annually  Dental exam: Completes semi-annually   Pap Smear: Completed in 2020 Mammogram: Completed in 2021, due. Completes at GYN. Colonoscopy: Completed Cologuard in 2021  BP Readings from Last 3 Encounters:  03/14/21 120/82  01/14/21 138/86  11/15/20 (!) 125/103       Review of Systems  Constitutional:  Negative for unexpected weight change.  HENT:  Negative for rhinorrhea.   Eyes:  Negative for visual disturbance.  Respiratory:  Positive for cough. Negative for shortness of breath.   Cardiovascular:  Negative for chest pain.  Gastrointestinal:  Negative for constipation and diarrhea.  Genitourinary:  Negative for difficulty urinating.  Musculoskeletal:  Negative for arthralgias and myalgias.  Skin:  Negative for rash.  Allergic/Immunologic: Positive for environmental allergies.  Neurological:  Positive for headaches. Negative for dizziness.  Psychiatric/Behavioral:  The patient is not nervous/anxious.         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   Trimalleolar fracture on right s/p fusion     Benign essential tremor    worsened by anxiety   Chronic pain due to injury    at R ankle due to ankle fracture   Depression    followed by psych   History of hypertension    HLD (hyperlipidemia)    Imbalance    likely multifactorial (s/p balance training at [facility] 08/2010) ?CMT   Influenza A 09/15/2018   Iron deficiency anemia    LLL  pneumonia 10/05/2018   Right ankle pain 2003   trimalleolar fx s/p fusion   Seizure (HCC)    focal, ex vacuo ventriculomegaly?, pending neuro w/u with sleep deprived EEG   Syncope and collapse 08/19/2011    Social History   Socioeconomic History   Marital status: Divorced    Spouse name: Not on file   Number of children: 1   Years of education: Not on file   Highest education level: Not on file  Occupational History   Occupation: Disabled    Comment: depression   Occupation: DISABILITY    Employer: UNEMPLOYED  Tobacco Use   Smoking status: Former    Packs/day: 1.00    Years: 20.00    Pack years: 20.00    Types: Cigarettes    Quit date: 08/12/1995    Years since quitting: 25.6   Smokeless tobacco: Never  Vaping Use   Vaping Use: Never used  Substance and Sexual Activity   Alcohol use: No    Alcohol/week: 0.0 standard drinks    Comment: occassinal   Drug use: No   Sexual activity: Not on file  Other Topics Concern   Not on file  Social History Narrative   No caffeine. Divorced. 1 child. 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, although endorses that she falls a lot    Social Determinants of Health   Financial Resource Strain: Not on file  Food  Insecurity: Not on file  Transportation Needs: Not on file  Physical Activity: Not on file  Stress: Not on file  Social Connections: Not on file  Intimate Partner Violence: Not on file    Past Surgical History:  Procedure Laterality Date   ANKLE FUSION  02/2004   Right-50% disability    CARPAL TUNNEL RELEASE  12/2017   L CTR repair, R thumb A1 pulley release (Gramig)   CESAREAN SECTION     HAMMER TOE SURGERY  09/12/2019   hospitalization  08/2011   syncope thought 2/2 seizure, MRI - Hyperintensity in the cerebral white matter bilaterally is nonspecific, started on Keppra   ORIF ANKLE FRACTURE  06/2003   Right    Family History  Problem Relation Age of  Onset   Depression Mother    Arrhythmia Mother 65       Dx with VTach after having sudden collapse, did NOT have h/o AMI's    Atrial fibrillation Father    Hypertension Father    Osteoarthritis Father    Aortic aneurysm Father    Hepatitis Brother        Hep. C   Anemia Sister     Allergies  Allergen Reactions   Amlodipine Swelling    Ankle edema    Current Outpatient Medications on File Prior to Visit  Medication Sig Dispense Refill   acetaminophen (TYLENOL) 500 MG tablet Take 500 mg by mouth as needed.     albuterol (VENTOLIN HFA) 108 (90 Base) MCG/ACT inhaler Inhale 2 puffs into the lungs every 6 (six) hours as needed for wheezing or shortness of breath. 8 each 11   Ascorbic Acid (VITAMIN C) 1000 MG tablet Take 1,000 mg by mouth daily.     aspirin EC 81 MG tablet Take 81 mg by mouth every evening.     cholecalciferol (VITAMIN D3) 25 MCG (1000 UNIT) tablet Take 1,000 Units by mouth daily.     cycloSPORINE (RESTASIS) 0.05 % ophthalmic emulsion Place 1 drop into both eyes 2 (two) times daily.     DULoxetine (CYMBALTA) 60 MG capsule Take 60 mg by mouth 2 (two) times daily.      escitalopram (LEXAPRO) 10 MG tablet Take 10 mg by mouth daily.      ferrous sulfate 325 (65 FE) MG tablet Take 325 mg by mouth daily with breakfast.     gabapentin (NEURONTIN) 300 MG capsule Take 300 mg by mouth. Take 1 capsule in the morning and 2 capsules in the evening.     hydrochlorothiazide (HYDRODIURIL) 25 MG tablet TAKE ONE TABLET BY MOUTH DAILY FOR BLOODPRESSURE 90 tablet 0   lamoTRIgine (LAMICTAL) 150 MG tablet TAKE 1 TABLET BY MOUTH TWICE A DAY 180 tablet 1   levothyroxine (SYNTHROID) 50 MCG tablet Take 1 tablet by mouth every morning on an empty stomach with water only.  No food or other medications for 30 minutes. 90 tablet 1   MAGNESIUM PO Take 400 mg by mouth 3 (three) times daily.      Melatonin 10 MG TABS Take 2 tablets by mouth at bedtime.      montelukast (SINGULAIR) 10 MG tablet TAKE 1  TABLET BY MOUTH AT BEDTIME. FOR COUGH AND ALLERGIES 90 tablet 0   Multiple Vitamin (MULTIVITAMIN) tablet Take 1 tablet by mouth daily.     naproxen sodium (ANAPROX) 220 MG tablet Take 440 mg by mouth as needed.     olmesartan (BENICAR) 20 MG tablet TAKE 1 TABLET BY MOUTH ONCE A  DAY FOR BLOOD PRESSURE 90 tablet 3   Omega-3 Fatty Acids (FISH OIL PO) Take 1 tablet by mouth daily.      pantoprazole (PROTONIX) 40 MG tablet Take 1 tablet (40 mg total) by mouth daily. 90 tablet 3   primidone (MYSOLINE) 50 MG tablet Take 1 tablet (50 mg total) by mouth in the morning and at bedtime. 180 tablet 2   propranolol (INDERAL) 40 MG tablet TAKE 1 TABLET BY MOUTH ONCE DAILY 90 tablet 2   rosuvastatin (CRESTOR) 10 MG tablet TAKE 1 TABLET BY MOUTH ONCE EVERY EVENING FOR CHOLESTEROL 90 tablet 0   tiZANidine (ZANAFLEX) 2 MG tablet Take 2 mg by mouth at bedtime.     vitamin B-12 (CYANOCOBALAMIN) 1000 MCG tablet Take 500 mcg by mouth daily.     Coenzyme Q10 (COQ-10 PO) Take by mouth. (Patient not taking: Reported on 03/14/2021)     No current facility-administered medications on file prior to visit.    BP 120/82   Pulse 83   Temp (!) 96.8 F (36 C) (Temporal)   Ht 5\' 6"  (1.676 m)   Wt 224 lb (101.6 kg)   LMP 06/01/2013   SpO2 97%   BMI 36.15 kg/m  Objective:   Physical Exam HENT:     Right Ear: Tympanic membrane and ear canal normal.     Left Ear: Tympanic membrane and ear canal normal.     Nose: Nose normal.  Eyes:     Conjunctiva/sclera: Conjunctivae normal.     Pupils: Pupils are equal, round, and reactive to light.  Neck:     Thyroid: No thyromegaly.  Cardiovascular:     Rate and Rhythm: Normal rate and regular rhythm.     Heart sounds: No murmur heard. Pulmonary:     Effort: Pulmonary effort is normal.     Breath sounds: Normal breath sounds. No rales.  Abdominal:     General: Bowel sounds are normal.     Palpations: Abdomen is soft.     Tenderness: There is no abdominal tenderness.   Musculoskeletal:        General: Normal range of motion.     Cervical back: Neck supple.  Lymphadenopathy:     Cervical: No cervical adenopathy.  Skin:    General: Skin is warm and dry.     Findings: No rash.  Neurological:     Mental Status: She is alert and oriented to person, place, and time.     Cranial Nerves: No cranial nerve deficit.     Deep Tendon Reflexes: Reflexes are normal and symmetric.  Psychiatric:        Mood and Affect: Mood normal.          Assessment & Plan:      This visit occurred during the SARS-CoV-2 public health emergency.  Safety protocols were in place, including screening questions prior to the visit, additional usage of staff PPE, and extensive cleaning of exam room while observing appropriate contact time as indicated for disinfecting solutions.

## 2021-03-14 NOTE — Assessment & Plan Note (Signed)
Discussed the importance of a healthy diet and regular exercise in order for weight loss, and to reduce the risk of further co-morbidity. ? ?Repeat A1C pending. ?

## 2021-03-14 NOTE — Assessment & Plan Note (Signed)
Taking levothyroxine 50 mcg correctly.  Repeat TSH pending. Continue levothyroxine 50 mcg.

## 2021-03-14 NOTE — Assessment & Plan Note (Signed)
Compliant to rosuvastatin 10 mg, continue same. Repeat lipid panel pending.  

## 2021-03-14 NOTE — Assessment & Plan Note (Signed)
Second Shingrix due, she will go to the pharmacy. Mammogram UTD. Pap smear UTD.  Colon cancer screening UTD, due in 2024.  Discussed the importance of a healthy diet and regular exercise in order for weight loss, and to reduce the risk of further co-morbidity.  Exam today stable. Labs pending.

## 2021-03-14 NOTE — Assessment & Plan Note (Addendum)
Overall feels well managed on Lexapro 10 mg, Cymbalta 60 mg, gabapentin 300 mg in AM and 600 mg in PM, tizanidine 2 mg HS.   Follows with psychiatry, Dr. Sharl Ma.  Continue current regimen.

## 2021-03-14 NOTE — Assessment & Plan Note (Signed)
Following with neurology, continue propranolol 40 mg daily and primidone 50 mg daily.

## 2021-03-14 NOTE — Assessment & Plan Note (Signed)
Following with neurology, continue Lamictal 150 mg BID.  No recent seizures.

## 2021-03-14 NOTE — Patient Instructions (Signed)
Stop by the lab prior to leaving today. I will notify you of your results once received.   Be sure to take your levothyroxine (thyroid medication) every morning on an empty stomach with water only.   No food or other medications for 30 minutes.   No heartburn medication, iron pills, calcium, vitamin D, or magnesium pills within four hours of taking levothyroxine.   It was a pleasure to see you today!  Preventive Care 19-59 Years Old, Female Preventive care refers to lifestyle choices and visits with your health care provider that can promote health and wellness. This includes: A yearly physical exam. This is also called an annual wellness visit. Regular dental and eye exams. Immunizations. Screening for certain conditions. Healthy lifestyle choices, such as: Eating a healthy diet. Getting regular exercise. Not using drugs or products that contain nicotine and tobacco. Limiting alcohol use. What can I expect for my preventive care visit? Physical exam Your health care provider will check your: Height and weight. These may be used to calculate your BMI (body mass index). BMI is a measurement that tells if you are at a healthy weight. Heart rate and blood pressure. Body temperature. Skin for abnormal spots. Counseling Your health care provider may ask you questions about your: Past medical problems. Family's medical history. Alcohol, tobacco, and drug use. Emotional well-being. Home life and relationship well-being. Sexual activity. Diet, exercise, and sleep habits. Work and work Statistician. Access to firearms. Method of birth control. Menstrual cycle. Pregnancy history. What immunizations do I need?  Vaccines are usually given at various ages, according to a schedule. Your health care provider will recommend vaccines for you based on your age, medicalhistory, and lifestyle or other factors, such as travel or where you work. What tests do I need? Blood tests Lipid and  cholesterol levels. These may be checked every 5 years, or more often if you are over 50 years old. Hepatitis C test. Hepatitis B test. Screening Lung cancer screening. You may have this screening every year starting at age 22 if you have a 30-pack-year history of smoking and currently smoke or have quit within the past 15 years. Colorectal cancer screening. All adults should have this screening starting at age 31 and continuing until age 71. Your health care provider may recommend screening at age 56 if you are at increased risk. You will have tests every 1-10 years, depending on your results and the type of screening test. Diabetes screening. This is done by checking your blood sugar (glucose) after you have not eaten for a while (fasting). You may have this done every 1-3 years. Mammogram. This may be done every 1-2 years. Talk with your health care provider about when you should start having regular mammograms. This may depend on whether you have a family history of breast cancer. BRCA-related cancer screening. This may be done if you have a family history of breast, ovarian, tubal, or peritoneal cancers. Pelvic exam and Pap test. This may be done every 3 years starting at age 72. Starting at age 65, this may be done every 5 years if you have a Pap test in combination with an HPV test. Other tests STD (sexually transmitted disease) testing, if you are at risk. Bone density scan. This is done to screen for osteoporosis. You may have this scan if you are at high risk for osteoporosis. Talk with your health care provider about your test results, treatment options,and if necessary, the need for more tests. Follow these instructions at  home: Eating and drinking  Eat a diet that includes fresh fruits and vegetables, whole grains, lean protein, and low-fat dairy products. Take vitamin and mineral supplements as recommended by your health care provider. Do not drink alcohol if: Your health  care provider tells you not to drink. You are pregnant, may be pregnant, or are planning to become pregnant. If you drink alcohol: Limit how much you have to 0-1 drink a day. Be aware of how much alcohol is in your drink. In the [place] one drink equals one 12 oz bottle of beer (355 mL), one 5 oz glass of wine (148 mL), or one 1 oz glass of hard liquor (44 mL).  Lifestyle Take daily care of your teeth and gums. Brush your teeth every morning and night with fluoride toothpaste. Floss one time each day. Stay active. Exercise for at least 30 minutes 5 or more days each week. Do not use any products that contain nicotine or tobacco, such as cigarettes, e-cigarettes, and chewing tobacco. If you need help quitting, ask your health care provider. Do not use drugs. If you are sexually active, practice safe sex. Use a condom or other form of protection to prevent STIs (sexually transmitted infections). If you do not wish to become pregnant, use a form of birth control. If you plan to become pregnant, see your health care provider for a prepregnancy visit. If told by your health care provider, take low-dose aspirin daily starting at age 74. Find healthy ways to cope with stress, such as: Meditation, yoga, or listening to music. Journaling. Talking to a trusted person. Spending time with friends and family. Safety Always wear your seat belt while driving or riding in a vehicle. Do not drive: If you have been drinking alcohol. Do not ride with someone who has been drinking. When you are tired or distracted. While texting. Wear a helmet and other protective equipment during sports activities. If you have firearms in your house, make sure you follow all gun safety procedures. What's next? Visit your health care provider once a year for an annual wellness visit. Ask your health care provider how often you should have your eyes and teeth checked. Stay up to date on all vaccines. This information is not  intended to replace advice given to you by your health care provider. Make sure you discuss any questions you have with your healthcare provider. Document Revised: 05/01/2020 Document Reviewed: 04/08/2018 Elsevier Patient Education  2022 [employer].

## 2021-03-14 NOTE — Assessment & Plan Note (Signed)
Well controlled in the office today, continue olmesartan 20 mg, HCTZ 25 mg.   CMP pending.

## 2021-03-14 NOTE — Assessment & Plan Note (Signed)
Chronic and continued but overall better.  Managed on pantoprazole 40 mg, Singulair 10 mg. PFT's negative. Continue current regimen. Following with GI.

## 2021-03-19 ENCOUNTER — Other Ambulatory Visit: Payer: Self-pay | Admitting: Primary Care

## 2021-03-19 ENCOUNTER — Other Ambulatory Visit: Payer: Self-pay | Admitting: Neurology

## 2021-03-19 DIAGNOSIS — E7800 Pure hypercholesterolemia, unspecified: Secondary | ICD-10-CM

## 2021-03-19 DIAGNOSIS — R053 Chronic cough: Secondary | ICD-10-CM

## 2021-03-19 DIAGNOSIS — I10 Essential (primary) hypertension: Secondary | ICD-10-CM

## 2021-03-19 DIAGNOSIS — E039 Hypothyroidism, unspecified: Secondary | ICD-10-CM

## 2021-04-01 ENCOUNTER — Other Ambulatory Visit: Payer: Self-pay | Admitting: Neurology

## 2021-04-01 ENCOUNTER — Telehealth: Payer: Self-pay | Admitting: Primary Care

## 2021-04-02 ENCOUNTER — Other Ambulatory Visit: Payer: Self-pay | Admitting: Primary Care

## 2021-04-02 NOTE — Progress Notes (Signed)
Assessment/Plan:     1.  Essential Tremor, exacerbated by GAD             -continue primidone, 50 mg bid             -Continue propranolol, 40 mg.  Did not want to increase this because of history of depression 2.  Chronic migraine/chronic daily headache  -okay right now 3.  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 [name] 4.  Hypertension             -Patient on 2 antihypertensives.  This is in addition to the propranolol I have her on for tremor.   5.  Cough  -Following with pulmonary.  They feel that some is due to reflux.  They are also treating her gabapentin for "neurogenic cough."  Seems to be helping. Subjective:   Jamie Patterson was seen today in follow up for essential tremor.  My previous records were reviewed prior to todays visit. I slightly increased her primidone last visit, in addition to her propranolol that she was already on.  She reports that helped. last visit, I really wanted the patient to follow-up with pulmonary because of her coughing and shortness of breath.  She has done that.  She is following with Dr. Celine Mans.  She last saw Dr. Celine Mans on June 6.  There was some improvement in the nighttime cough with PPI.  I noted that Dr. Celine Mans increased the patient's gabapentin to 300 mg in the morning, 300 mg in the afternoon and 600 mg at night for neurogenic cough.  Cough is better than it was but it does come and go.    Current prescribed movement disorder medications: Primidone, 50 mg twice daily (increased last visit) Propranolol, 40 mg daily (She is already on gabapentin, 300/300/600 for cough)   ALLERGIES:   Allergies  Allergen Reactions   Amlodipine Swelling    Ankle edema    CURRENT MEDICATIONS:  Outpatient Encounter Medications as of 04/04/2021  Medication Sig   acetaminophen (TYLENOL) 500 MG tablet Take 500 mg by mouth as  needed.   albuterol (VENTOLIN HFA) 108 (90 Base) MCG/ACT inhaler Inhale 2 puffs into the lungs every 6 (six) hours as needed for wheezing or shortness of breath.   Ascorbic Acid (VITAMIN C) 1000 MG tablet Take 1,000 mg by mouth daily.   aspirin EC 81 MG tablet Take 81 mg by mouth every evening.   cholecalciferol (VITAMIN D3) 25 MCG (1000 UNIT) tablet Take 1,000 Units by mouth daily.   cycloSPORINE (RESTASIS) 0.05 % ophthalmic emulsion Place 1 drop into both eyes 2 (two) times daily.   DULoxetine (CYMBALTA) 60 MG capsule Take 60 mg by mouth 2 (two) times daily.    escitalopram (LEXAPRO) 10 MG tablet Take 10 mg by mouth daily.    ferrous sulfate 325 (65 FE) MG tablet Take 325 mg by mouth daily with breakfast.   gabapentin (NEURONTIN) 300 MG capsule Take 300 mg by mouth. Take 1 capsule in the morning and 2 capsules in the evening.   hydrochlorothiazide (HYDRODIURIL) 25 MG tablet TAKE ONE TABLET BY MOUTH DAILY FOR BLOODPRESSURE   lamoTRIgine (LAMICTAL) 150 MG tablet TAKE 1 TABLET BY MOUTH TWICE A DAY   levothyroxine (SYNTHROID) 50 MCG tablet Take 1 tablet  by mouth every morning on an empty stomach with water only.  No food or other medications for 30 minutes.   MAGNESIUM PO Take 400 mg by mouth 3 (three) times daily.    Melatonin 10 MG TABS Take 2 tablets by mouth at bedtime.    montelukast (SINGULAIR) 10 MG tablet TAKE 1 TABLET BY MOUTH AT BEDTIME. FOR COUGH AND ALLERGIES   Multiple Vitamin (MULTIVITAMIN) tablet Take 1 tablet by mouth daily.   naproxen sodium (ANAPROX) 220 MG tablet Take 440 mg by mouth as needed.   olmesartan (BENICAR) 20 MG tablet TAKE 1 TABLET BY MOUTH ONCE A DAY FOR BLOOD PRESSURE   Omega-3 Fatty Acids (FISH OIL PO) Take 1 tablet by mouth daily.    pantoprazole (PROTONIX) 40 MG tablet Take 1 tablet (40 mg total) by mouth daily.   rosuvastatin (CRESTOR) 10 MG tablet TAKE 1 TABLET BY MOUTH ONCE EVERY EVENING FOR CHOLESTEROL   tiZANidine (ZANAFLEX) 2 MG tablet Take 2 mg by mouth  at bedtime.   vitamin B-12 (CYANOCOBALAMIN) 1000 MCG tablet Take 500 mcg by mouth daily.   [DISCONTINUED] primidone (MYSOLINE) 50 MG tablet Take 1 tablet (50 mg total) by mouth in the morning and at bedtime.   [DISCONTINUED] propranolol (INDERAL) 40 MG tablet TAKE 1 TABLET BY MOUTH ONCE DAILY   primidone (MYSOLINE) 50 MG tablet Take 1 tablet (50 mg total) by mouth in the morning and at bedtime.   propranolol (INDERAL) 40 MG tablet Take 1 tablet (40 mg total) by mouth daily.   [DISCONTINUED] Coenzyme Q10 (COQ-10 PO) Take by mouth. (Patient not taking: No sig reported)   No facility-administered encounter medications on file as of 04/04/2021.     Objective:    PHYSICAL EXAMINATION:    VITALS:   Vitals:   04/04/21 1452  BP: 123/69  Pulse: 97  SpO2: 97%  Weight: 223 lb (101.2 kg)  Height: 5\' 6"  (1.676 m)    GEN:  The patient appears stated age and is in NAD. HEENT:  Normocephalic, atraumatic  Neurological examination:  Orientation: The patient is alert and oriented x3. Cranial nerves: There is good facial symmetry. The speech is fluent and clear. Soft palate rises symmetrically and there is no tongue deviation. Hearing is intact to conversational tone. Sensation: Sensation is intact to light touch throughout Motor: Strength is at least antigravity x4.  Movement examination: Tone: There is normal tone in the UE/LE Abnormal movements: no rest tremor.  No significant postural tremor.   Coordination:  There is no decremation with RAM's, with any form of RAMS, including alternating supination and pronation of the forearm, hand opening and closing, finger taps, heel taps and toe taps. Gait and Station: The patient has no difficulty arising out of a deep-seated chair without the use of the hands.  I have reviewed and interpreted the following labs independently   Chemistry      Component Value Date/Time   NA 133 (L) 03/14/2021 1302   K 4.3 03/14/2021 1302   CL 96 03/14/2021 1302    CO2 28 03/14/2021 1302   BUN 6 03/14/2021 1302   CREATININE 0.81 03/14/2021 1302   CREATININE 0.67 02/16/2018 1332      Component Value Date/Time   CALCIUM 10.0 03/14/2021 1302   ALKPHOS 99 03/14/2021 1302   AST 38 (H) 03/14/2021 1302   ALT 40 (H) 03/14/2021 1302   BILITOT 0.4 03/14/2021 1302      Lab Results  Component Value Date   WBC 5.1  02/15/2020   HGB 12.8 02/15/2020   HCT 37.6 02/15/2020   MCV 95.9 02/15/2020   PLT 290 02/15/2020   Lab Results  Component Value Date   TSH 3.39 03/14/2021     Chemistry      Component Value Date/Time   NA 133 (L) 03/14/2021 1302   K 4.3 03/14/2021 1302   CL 96 03/14/2021 1302   CO2 28 03/14/2021 1302   BUN 6 03/14/2021 1302   CREATININE 0.81 03/14/2021 1302   CREATININE 0.67 02/16/2018 1332      Component Value Date/Time   CALCIUM 10.0 03/14/2021 1302   ALKPHOS 99 03/14/2021 1302   AST 38 (H) 03/14/2021 1302   ALT 40 (H) 03/14/2021 1302   BILITOT 0.4 03/14/2021 1302       Cc:  Doreene Nest, NP

## 2021-04-02 NOTE — Telephone Encounter (Signed)
Dr. Arbutus Leas, patient has a follow up visit scheduled with you for 04/04/21. Fyi. Have a great rest of your week :)

## 2021-04-02 NOTE — Telephone Encounter (Signed)
Please notify patient that her propranolol is typically filled by her neurologist. Her neurologist declined her refill as she has not been in for follow up as recommended.   Patient needs to contact neurologist's office.

## 2021-04-02 NOTE — Telephone Encounter (Signed)
Please advise 

## 2021-04-02 NOTE — Telephone Encounter (Signed)
Pt called to follow up on refill request  °

## 2021-04-02 NOTE — Telephone Encounter (Signed)
Left message to return call to our office.  

## 2021-04-03 ENCOUNTER — Other Ambulatory Visit: Payer: Self-pay | Admitting: Neurology

## 2021-04-03 NOTE — Telephone Encounter (Signed)
Called patient she is aware that needs to come from neurology she has called ans has an appointment with scheduled.

## 2021-04-03 NOTE — Telephone Encounter (Signed)
Returning phone call °

## 2021-04-04 ENCOUNTER — Ambulatory Visit: Payer: Medicare PPO | Admitting: Neurology

## 2021-04-04 ENCOUNTER — Other Ambulatory Visit: Payer: Self-pay

## 2021-04-04 VITALS — BP 123/69 | HR 97 | Ht 66.0 in | Wt 223.0 lb

## 2021-04-04 DIAGNOSIS — G250 Essential tremor: Secondary | ICD-10-CM

## 2021-04-04 MED ORDER — PROPRANOLOL HCL 40 MG PO TABS: 40.0000 mg | ORAL_TABLET | Freq: Every day | ORAL | 3 refills | Status: DC

## 2021-04-04 MED ORDER — PRIMIDONE 50 MG PO TABS: 50.0000 mg | ORAL_TABLET | Freq: Two times a day (BID) | ORAL | 3 refills | Status: DC

## 2021-05-21 ENCOUNTER — Other Ambulatory Visit: Payer: Self-pay | Admitting: Primary Care

## 2021-05-21 ENCOUNTER — Telehealth: Payer: Self-pay | Admitting: Internal Medicine

## 2021-05-21 DIAGNOSIS — I10 Essential (primary) hypertension: Secondary | ICD-10-CM

## 2021-05-21 NOTE — Telephone Encounter (Signed)
I have called the pt and LM on VM making her aware that the rx was sent to the pharmacy for 90 day supply of the protonix. Advised her to reach out to the pharmacy to see if it is related to her insurance.  Will sign off

## 2021-08-22 ENCOUNTER — Ambulatory Visit: Payer: Medicare PPO | Admitting: Neurology

## 2021-09-23 ENCOUNTER — Other Ambulatory Visit: Payer: Self-pay

## 2021-09-23 ENCOUNTER — Other Ambulatory Visit: Payer: Self-pay | Admitting: Nurse Practitioner

## 2021-09-23 ENCOUNTER — Encounter: Payer: Self-pay | Admitting: Nurse Practitioner

## 2021-09-23 ENCOUNTER — Ambulatory Visit: Payer: Medicare PPO | Admitting: Nurse Practitioner

## 2021-09-23 ENCOUNTER — Ambulatory Visit (INDEPENDENT_AMBULATORY_CARE_PROVIDER_SITE_OTHER)
Admission: RE | Admit: 2021-09-23 | Discharge: 2021-09-23 | Disposition: A | Discharge status: Home / Self Care | Payer: Medicare PPO | Source: Ambulatory Visit | Attending: Nurse Practitioner | Admitting: Nurse Practitioner

## 2021-09-23 VITALS — BP 118/94 | HR 107 | Temp 97.6°F | Resp 18 | Wt 217.5 lb

## 2021-09-23 DIAGNOSIS — R0602 Shortness of breath: Secondary | ICD-10-CM | POA: Insufficient documentation

## 2021-09-23 DIAGNOSIS — R9389 Abnormal findings on diagnostic imaging of other specified body structures: Secondary | ICD-10-CM

## 2021-09-23 DIAGNOSIS — R0689 Other abnormalities of breathing: Secondary | ICD-10-CM | POA: Insufficient documentation

## 2021-09-23 DIAGNOSIS — J22 Unspecified acute lower respiratory infection: Secondary | ICD-10-CM | POA: Diagnosis not present

## 2021-09-23 DIAGNOSIS — R059 Cough, unspecified: Secondary | ICD-10-CM | POA: Diagnosis not present

## 2021-09-23 DIAGNOSIS — R051 Acute cough: Secondary | ICD-10-CM | POA: Diagnosis not present

## 2021-09-23 MED ORDER — GUAIFENESIN-CODEINE 100-10 MG/5ML PO SOLN: 5.0000 mL | Freq: Three times a day (TID) | ORAL | 0 refills | Status: AC | PRN

## 2021-09-23 MED ORDER — PREDNISONE 20 MG PO TABS: ORAL_TABLET | ORAL | 0 refills | Status: AC

## 2021-09-23 MED ORDER — AMOXICILLIN-POT CLAVULANATE 875-125 MG PO TABS: 1.0000 | ORAL_TABLET | Freq: Two times a day (BID) | ORAL | 0 refills | Status: AC

## 2021-09-23 NOTE — Assessment & Plan Note (Signed)
Patient endorses shortness of breath having wheezing in office.  We will start patient on prednisone taper.  Signs and symptoms reviewed when to seek urgent or emergent healthcare

## 2021-09-23 NOTE — Assessment & Plan Note (Signed)
Given adventitious breath sounds will elect to get chest x-ray in office

## 2021-09-23 NOTE — Patient Instructions (Signed)
Nice to see you today I will be in touch with xray results once I have them Sent several prescriptions to your pharmacy Let me know If not improving in the next 3 days Follow up if no improvement

## 2021-09-23 NOTE — Assessment & Plan Note (Signed)
Given length of symptoms and patient presentation will elect to treat for lower respiratory infection.  Start patient on Augmentin 875-125 mg twice daily for 7 days.  Pending chest x-ray

## 2021-09-23 NOTE — Assessment & Plan Note (Signed)
Patient is a very deep and aggressive cough heard in office.  She is tried over-the-counter without relief.  We will send in some codeine-guaifenesin she can do up to 3 times a day as needed cough

## 2021-09-23 NOTE — Progress Notes (Signed)
Acute Office Visit  Subjective:    Patient ID: Jamie Patterson, female    DOB: 08-23-1960, 61 y.o.   MRN: [id number]  Chief Complaint  Patient presents with   Cough    Sx x 2 weeks, sore throat, achy, sinus pressure/pain. Coughing up greenish phlegm. No fever. SOB. Covid test was not done.     Patient is in today for Cough  Symptoms approx 2 weeks ago. Pfizer x 2 Covid test not done Grandaughter had strep but no one else  Hx of pna without hospitlization Has not been using albuterol inhaler  Has tried nyqill and dyquill no relief  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   Trimalleolar fracture on right s/p fusion     Benign essential tremor    worsened by anxiety   Chronic pain due to injury    at R ankle due to ankle fracture   Depression    followed by psych   History of hypertension    HLD (hyperlipidemia)    Imbalance    likely multifactorial (s/p balance training at [facility] 08/2010) ?CMT   Influenza A 09/15/2018   Iron deficiency anemia    LLL pneumonia 10/05/2018   Right ankle pain 2003   trimalleolar fx s/p fusion   Seizure ([place])    focal, ex vacuo ventriculomegaly?, pending neuro w/u with sleep deprived EEG   Syncope and collapse 08/19/2011    Past Surgical History:  Procedure Laterality Date   ANKLE FUSION  02/2004   Right-50% disability    CARPAL TUNNEL RELEASE  12/2017   L CTR repair, R thumb A1 pulley release ([place])   CESAREAN SECTION     HAMMER TOE SURGERY  09/12/2019   hospitalization  08/2011   syncope thought 2/2 seizure, MRI - Hyperintensity in the cerebral white matter bilaterally is nonspecific, started on Keppra   ORIF ANKLE FRACTURE  06/2003   Right    Family History  Problem Relation Age of Onset   Depression Mother    Arrhythmia Mother 15       Dx with [place] after having sudden collapse, did NOT have h/o AMI's    Atrial fibrillation Father    Hypertension Father    Osteoarthritis Father     Aortic aneurysm Father    Hepatitis Brother        Hep. C   Anemia Sister     Social History   Socioeconomic History   Marital status: Divorced    Spouse name: Not on file   Number of children: 1   Years of education: Not on file   Highest education level: Not on file  Occupational History   Occupation: Disabled    Comment: depression   Occupation: DISABILITY    Employer: UNEMPLOYED  Tobacco Use   Smoking status: Former    Packs/day: 1.00    Years: 20.00    Pack years: 20.00    Types: Cigarettes    Quit date: 08/12/1995    Years since quitting: 26.1   Smokeless tobacco: Never  Vaping Use   Vaping Use: Never used  Substance and Sexual Activity   Alcohol use: No    Alcohol/week: 0.0 standard drinks    Comment: occassinal   Drug use: No   Sexual activity: Not on file  Other Topics Concern   Not on file  Social History Narrative   No caffeine. Divorced. 1 child. 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 Has a cane but hasn't needed it recently, although endorses that she falls a lot    Social Determinants of Health   Financial Resource Strain: Not on file  Food Insecurity: Not on file  Transportation Needs: Not on file  Physical Activity: Not on file  Stress: Not on file  Social Connections: Not on file  Intimate Partner Violence: Not on file    Outpatient Medications Prior to Visit  Medication Sig Dispense Refill   acetaminophen (TYLENOL) 500 MG tablet Take 500 mg by mouth as needed.     albuterol (VENTOLIN HFA) 108 (90 Base) MCG/ACT inhaler Inhale 2 puffs into the lungs every 6 (six) hours as needed for wheezing or shortness of breath. 8 each 11   Ascorbic Acid (VITAMIN C) 1000 MG tablet Take 1,000 mg by mouth daily.     aspirin EC 81 MG tablet Take 81 mg by mouth every evening.     cholecalciferol (VITAMIN D3) 25 MCG (1000 UNIT) tablet Take 1,000 Units by mouth daily.     cycloSPORINE (RESTASIS) 0.05 % ophthalmic emulsion Place 1 drop  into both eyes 2 (two) times daily.     DULoxetine (CYMBALTA) 60 MG capsule Take 60 mg by mouth 2 (two) times daily.      escitalopram (LEXAPRO) 10 MG tablet Take 10 mg by mouth daily.      ferrous sulfate 325 (65 FE) MG tablet Take 325 mg by mouth daily with breakfast.     gabapentin (NEURONTIN) 300 MG capsule Take 300 mg by mouth. Take 1 capsule in the morning and 2 capsules in the evening.     hydrochlorothiazide (HYDRODIURIL) 25 MG tablet TAKE ONE TABLET BY MOUTH DAILY FOR BLOODPRESSURE 90 tablet 3   lamoTRIgine (LAMICTAL) 150 MG tablet TAKE 1 TABLET BY MOUTH TWICE A DAY 180 tablet 1   levothyroxine (SYNTHROID) 50 MCG tablet Take 1 tablet by mouth every morning on an empty stomach with water only.  No food or other medications for 30 minutes. 90 tablet 3   MAGNESIUM PO Take 400 mg by mouth 3 (three) times daily.      Melatonin 10 MG TABS Take 2 tablets by mouth at bedtime.      montelukast (SINGULAIR) 10 MG tablet TAKE 1 TABLET BY MOUTH AT BEDTIME. FOR COUGH AND ALLERGIES 90 tablet 3   Multiple Vitamin (MULTIVITAMIN) tablet Take 1 tablet by mouth daily.     naproxen sodium (ANAPROX) 220 MG tablet Take 440 mg by mouth as needed.     olmesartan (BENICAR) 20 MG tablet TAKE 1 TABLET BY MOUTH ONCE A DAY FOR BLOOD PRESSURE 90 tablet 2   Omega-3 Fatty Acids (FISH OIL PO) Take 1 tablet by mouth daily.      pantoprazole (PROTONIX) 40 MG tablet Take 1 tablet (40 mg total) by mouth daily. 90 tablet 3   primidone (MYSOLINE) 50 MG tablet Take 1 tablet (50 mg total) by mouth in the morning and at bedtime. 180 tablet 3   propranolol (INDERAL) 40 MG tablet Take 1 tablet (40 mg total) by mouth daily. 90 tablet 3   rosuvastatin (CRESTOR) 10 MG tablet TAKE 1 TABLET BY MOUTH ONCE EVERY EVENING FOR CHOLESTEROL 90 tablet 3   tiZANidine (ZANAFLEX) 2 MG tablet Take 2 mg by mouth at bedtime.     vitamin B-12 (CYANOCOBALAMIN) 1000 MCG tablet Take 500 mcg by mouth daily.     No facility-administered medications prior  to visit.    Allergies  Allergen Reactions   Amlodipine Swelling    Ankle edema    Review of Systems  Constitutional:  Positive for appetite change (no tastse) and fatigue. Negative for chills and fever.  HENT:  Positive for congestion, sinus pressure and sinus pain. Negative for ear discharge, ear pain and sore throat.   Respiratory:  Positive for cough (rust green stuff.) and shortness of breath.   Cardiovascular:  Negative for chest pain.  Gastrointestinal:  Negative for diarrhea, nausea and vomiting.  Musculoskeletal:  Negative for arthralgias and myalgias.  Neurological:  Positive for headaches.      Objective:    Physical Exam Vitals and nursing note reviewed.  Constitutional:      Appearance: She is obese.  HENT:     Right Ear: Tympanic membrane, ear canal and external ear normal.     Left Ear: Tympanic membrane, ear canal and external ear normal.     Nose:     Right Sinus: Maxillary sinus tenderness present. No frontal sinus tenderness.     Left Sinus: No maxillary sinus tenderness or frontal sinus tenderness.     Mouth/Throat:     Mouth: Mucous membranes are moist.     Pharynx: Oropharynx is clear.  Cardiovascular:     Rate and Rhythm: Regular rhythm. Tachycardia present.     Heart sounds: Normal heart sounds.  Pulmonary:     Effort: Pulmonary effort is normal.     Breath sounds: Examination of the right-upper field reveals wheezing. Examination of the left-upper field reveals wheezing. Examination of the right-middle field reveals wheezing. Examination of the right-lower field reveals wheezing. Examination of the left-lower field reveals wheezing. Wheezing present.  Lymphadenopathy:     Cervical: No cervical adenopathy.  Skin:    General: Skin is warm.  Neurological:     Mental Status: She is alert.    BP (!) 118/94    Pulse (!) 107    Temp 97.6 F (36.4 C)    Resp 18    Wt 217 lb 8 oz (98.7 kg)    LMP 06/01/2013    SpO2 96%    BMI 35.11 kg/m  Wt Readings  from Last 3 Encounters:  09/23/21 217 lb 8 oz (98.7 kg)  04/04/21 223 lb (101.2 kg)  03/14/21 224 lb (101.6 kg)    Health Maintenance Due  Topic Date Due   Zoster Vaccines- Shingrix (2 of 2) 06/08/2020   INFLUENZA VACCINE  03/11/2021   TETANUS/TDAP  08/27/2021    There are no preventive care reminders to display for this patient.   Lab Results  Component Value Date   TSH 3.39 03/14/2021   Lab Results  Component Value Date   WBC 5.1 02/15/2020   HGB 12.8 02/15/2020   HCT 37.6 02/15/2020   MCV 95.9 02/15/2020   PLT 290 02/15/2020   Lab Results  Component Value Date   NA 133 (L) 03/14/2021   K 4.3 03/14/2021   CO2 28 03/14/2021   GLUCOSE 115 (H) 03/14/2021   BUN 6 03/14/2021   CREATININE 0.81 03/14/2021   BILITOT 0.4 03/14/2021   ALKPHOS 99 03/14/2021   AST 38 (H) 03/14/2021   ALT 40 (H) 03/14/2021   PROT 7.0 03/14/2021   ALBUMIN 4.6 03/14/2021   CALCIUM 10.0 03/14/2021   GFR 79.56 03/14/2021   Lab Results  Component Value Date   CHOL 202 (H) 03/14/2021   Lab Results  Component Value Date   HDL 74.90 03/14/2021   Lab Results  Component  Value Date   LDLCALC 94 07/12/2020   Lab Results  Component Value Date   TRIG 202.0 (H) 03/14/2021   Lab Results  Component Value Date   CHOLHDL 3 03/14/2021   Lab Results  Component Value Date   HGBA1C 5.8 03/14/2021       Assessment & Plan:   Problem List Items Addressed This Visit   None Visit Diagnoses     Lower respiratory infection    -  Primary   Relevant Medications   amoxicillin-clavulanate (AUGMENTIN) 875-125 MG tablet   Acute cough       Relevant Medications   guaiFENesin-codeine 100-10 MG/5ML syrup   Shortness of breath       Relevant Medications   predniSONE (DELTASONE) 20 MG tablet   Adventitious breath sounds       Relevant Orders   DG Chest 2 View (Completed)        Meds ordered this encounter  Medications   amoxicillin-clavulanate (AUGMENTIN) 875-125 MG tablet    Sig: Take 1  tablet by mouth 2 (two) times daily for 7 days.    Dispense:  14 tablet    Refill:  0    Order Specific Question:   Supervising Provider    Answer:   Glori Bickers, MARNE A [[id number]]   predniSONE (DELTASONE) 20 MG tablet    Sig: Take 1 tablet (20 mg total) by mouth 2 (two) times daily with a meal for 3 days, THEN 1 tablet (20 mg total) daily with breakfast for 3 days.    Dispense:  9 tablet    Refill:  0    Order Specific Question:   Supervising Provider    Answer:   TOWER, MARNE A [[id number]]   guaiFENesin-codeine 100-10 MG/5ML syrup    Sig: Take 5 mLs by mouth 3 (three) times daily as needed for up to 8 days for cough.    Dispense:  120 mL    Refill:  0    Order Specific Question:   Supervising Provider    Answer:   Loura Pardon A [[id number]]   This visit occurred during the SARS-CoV-2 public health emergency.  Safety protocols were in place, including screening questions prior to the visit, additional usage of staff PPE, and extensive cleaning of exam room while observing appropriate contact time as indicated for disinfecting solutions.    Romilda Garret, NP

## 2021-10-29 ENCOUNTER — Ambulatory Visit (INDEPENDENT_AMBULATORY_CARE_PROVIDER_SITE_OTHER): Payer: Medicare PPO

## 2021-10-29 VITALS — Wt 217.0 lb

## 2021-10-29 DIAGNOSIS — Z0000 Encounter for general adult medical examination without abnormal findings: Secondary | ICD-10-CM | POA: Diagnosis not present

## 2021-10-29 NOTE — Progress Notes (Addendum)
? ?Subjective:  ? Jamie Patterson is a 61 y.o. female who presents for Medicare Annual (Subsequent) preventive examination. ? ?Virtual Visit via Telephone Note ? ?I connected with  Jamie Patterson on 10/29/21 at 10:30 AM EDT by telephone and verified that I am speaking with the correct person using two identifiers. ? ?Location: ?Patient: Home ?Provider: [place] ?Persons participating in the virtual visit: [name] ?  ?I discussed the limitations, risks, security and privacy concerns of performing an evaluation and management service by telephone and the availability of in person appointments. The patient expressed understanding and agreed to proceed. ? ?Interactive audio and video telecommunications were attempted between this nurse and patient, however failed, due to patient having technical difficulties OR patient did not have access to video capability.  We continued and completed visit with audio only. ? ?Some vital signs may be absent or patient reported.  ? ?Jamie Dionne Ano, LPN  ? ?Review of Systems    ? ?Cardiac Risk Factors include: advanced age (>23men, >27 women);sedentary lifestyle;obesity (BMI >30kg/m2);dyslipidemia;hypertension;Other (see comment), Risk factor comments: pre-diabetes, Anemia, chronic cough/SOB ? ?   ?Objective:  ?  ?Today's Vitals  ? 10/29/21 1032  ?Weight: 217 lb (98.4 kg)  ?PainSc: 7   ? ?Body mass index is 35.02 kg/m?. ? ?Advanced Directives 10/29/2021 04/04/2021 06/26/2020 12/01/2019 11/25/2018 08/19/2011  ?Does Patient Have a Medical Advance Directive? No No No No No Patient does not have advance directive  ?Would patient like information on creating a medical advance directive? No - Patient declined - - No - Patient declined - -  ? ? ?Current Medications (verified) ?Outpatient Encounter Medications as of 10/29/2021  ?Medication Sig  ? acetaminophen (TYLENOL) 500 MG tablet Take 500 mg by mouth as needed.  ? albuterol (VENTOLIN HFA) 108 (90 Base) MCG/ACT inhaler Inhale  2 puffs into the lungs every 6 (six) hours as needed for wheezing or shortness of breath.  ? Ascorbic Acid (VITAMIN C) 1000 MG tablet Take 1,000 mg by mouth daily.  ? aspirin EC 81 MG tablet Take 81 mg by mouth every evening.  ? cholecalciferol (VITAMIN D3) 25 MCG (1000 UNIT) tablet Take 1,000 Units by mouth daily.  ? cycloSPORINE (RESTASIS) 0.05 % ophthalmic emulsion Place 1 drop into both eyes 2 (two) times daily.  ? DULoxetine (CYMBALTA) 60 MG capsule Take 60 mg by mouth 2 (two) times daily.   ? escitalopram (LEXAPRO) 10 MG tablet Take 10 mg by mouth daily.   ? ferrous sulfate 325 (65 FE) MG tablet Take 325 mg by mouth daily with breakfast.  ? gabapentin (NEURONTIN) 300 MG capsule Take 300 mg by mouth. Take 1 capsule in the morning and 2 capsules in the evening.  ? hydrochlorothiazide (HYDRODIURIL) 25 MG tablet TAKE ONE TABLET BY MOUTH DAILY FOR BLOODPRESSURE  ? lamoTRIgine (LAMICTAL) 150 MG tablet TAKE 1 TABLET BY MOUTH TWICE A DAY  ? levothyroxine (SYNTHROID) 50 MCG tablet Take 1 tablet by mouth every morning on an empty stomach with water only.  No food or other medications for 30 minutes.  ? MAGNESIUM PO Take 400 mg by mouth 3 (three) times daily.   ? Melatonin 10 MG TABS Take 2 tablets by mouth at bedtime.   ? montelukast (SINGULAIR) 10 MG tablet TAKE 1 TABLET BY MOUTH AT BEDTIME. FOR COUGH AND ALLERGIES  ? Multiple Vitamin (MULTIVITAMIN) tablet Take 1 tablet by mouth daily.  ? naproxen sodium (ANAPROX) 220 MG tablet Take 440 mg by mouth  as needed.  ? olmesartan (BENICAR) 20 MG tablet TAKE 1 TABLET BY MOUTH ONCE A DAY FOR BLOOD PRESSURE  ? Omega-3 Fatty Acids (FISH OIL PO) Take 1 tablet by mouth daily.   ? pantoprazole (PROTONIX) 40 MG tablet Take 1 tablet (40 mg total) by mouth daily.  ? primidone (MYSOLINE) 50 MG tablet Take 1 tablet (50 mg total) by mouth in the morning and at bedtime.  ? propranolol (INDERAL) 40 MG tablet Take 1 tablet (40 mg total) by mouth daily.  ? rosuvastatin (CRESTOR) 10 MG tablet  TAKE 1 TABLET BY MOUTH ONCE EVERY EVENING FOR CHOLESTEROL  ? tiZANidine (ZANAFLEX) 4 MG tablet Take by mouth.  ? vitamin B-12 (CYANOCOBALAMIN) 1000 MCG tablet Take 500 mcg by mouth daily.  ? [DISCONTINUED] tiZANidine (ZANAFLEX) 2 MG tablet Take 2 mg by mouth at bedtime.  ? ?No facility-administered encounter medications on file as of 10/29/2021.  ? ? ?Allergies (verified) ?Amlodipine  ? ?History: ?Past Medical History:  ?Diagnosis Date  ? Amblyopia   ? 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 Trimalleolar fracture on right s/p fusion    ? Benign essential tremor   ? worsened by anxiety  ? Chronic pain due to injury   ? at R ankle due to ankle fracture  ? Depression   ? followed by psych  ? History of hypertension   ? HLD (hyperlipidemia)   ? Imbalance   ? likely multifactorial (s/p balance training at [facility] 08/2010) ?CMT  ? Influenza A 09/15/2018  ? Iron deficiency anemia   ? LLL pneumonia 10/05/2018  ? Right ankle pain 2003  ? trimalleolar fx s/p fusion  ? Seizure ([place])   ? focal, ex vacuo ventriculomegaly?, pending neuro w/u with sleep deprived EEG  ? Syncope and collapse 08/19/2011  ? ?Past Surgical History:  ?Procedure Laterality Date  ? ANKLE FUSION  02/2004  ? Right-50% disability   ? CARPAL TUNNEL RELEASE  12/2017  ? L CTR repair, R thumb A1 pulley release (Jamie Patterson)  ? CESAREAN SECTION    ? HAMMER TOE SURGERY  09/12/2019  ? hospitalization  08/2011  ? syncope thought 2/2 seizure, MRI - Hyperintensity in the cerebral white matter bilaterally is nonspecific, started on Keppra  ? ORIF ANKLE FRACTURE  06/2003  ? Right  ? ?Family History  ?Problem Relation Age of Onset  ? Depression Mother   ? Arrhythmia Mother 47  ?     Dx with Jamie Patterson after having sudden collapse, did NOT have h/o AMI's   ? Atrial fibrillation Father   ? Hypertension Father   ? Osteoarthritis Father   ? Aortic aneurysm Father   ? Hepatitis Brother   ?     Hep. C  ? Anemia Sister   ? ?Social History  ? ?Socioeconomic History  ? Marital status: Married  ?   Spouse name: Not on file  ? Number of children: 1  ? Years of education: Not on file  ? Highest education level: Not on file  ?Occupational History  ? Occupation: Disabled  ?  Comment: depression  ? Occupation: DISABILITY  ?  Employer: UNEMPLOYED  ?Tobacco Use  ? Smoking status: Former  ?  Packs/day: 1.00  ?  Years: 20.00  ?  Pack years: 20.00  ?  Types: Cigarettes  ?  Quit date: 08/12/1995  ?  Years since quitting: 26.2  ? Smokeless tobacco: Never  ?Vaping Use  ? Vaping Use: Never used  ?Substance and Sexual Activity  ? Alcohol  use: No  ?  Alcohol/week: 0.0 standard drinks  ?  Comment: occassinal  ? Drug use: No  ? Sexual activity: Not on file  ?Other Topics Concern  ? Not on file  ?Social History Narrative  ? No caffeine. Divorced. 1 child. 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 Has a cane but hasn't needed it recently, although endorses that she falls a lot   ? ?Social Determinants of Health  ? ?Financial Resource Strain: Low Risk   ? Difficulty of Paying Living Expenses: Not hard at all  ?Food Insecurity: No Food Insecurity  ? Worried About Charity fundraiser in the Last Year: Never true  ? Ran Out of Food in the Last Year: Never true  ?Transportation Needs: No Transportation Needs  ? Lack of Transportation (Medical): No  ? Lack of Transportation (Non-Medical): No  ?Physical Activity: Inactive  ? Days of Exercise per Week: 0 days  ? Minutes of Exercise per Session: 0 min  ?Stress: Stress Concern Present  ? Feeling of Stress : To some extent  ?Social Connections: Moderately Isolated  ? Frequency of Communication with Friends and Family: More than three times a week  ? Frequency of Social Gatherings with Friends and Family: More than three times a week  ? Attends Religious Services: Never  ? Active Member of Clubs or Organizations: Not on file  ? Attends Archivist Meetings: Never  ? Marital Status: Married  ? ? ?Tobacco Counseling ?Counseling given: Not Answered ? ? ?Clinical  Intake: ? ?Pre-visit preparation completed: Yes ? ?Pain : 0-10 ?Pain Score: 7  ?Pain Type: Chronic pain ?Pain Location: Back ?Pain Descriptors / Indicators: Aching, Sharp, Tender ?Pain Onset: More than a

## 2021-10-29 NOTE — Patient Instructions (Signed)
Jamie Patterson , ?Thank you for taking time to come for your Medicare Wellness Visit. I appreciate your ongoing commitment to your health goals. Please review the following plan we discussed and let me know if I can assist you in the future.  ? ?Screening recommendations/referrals: ?Colonoscopy: Cologuard done 03/14/2020 - repeat in 3 years ?Mammogram: Done 07/18/2020 - Repeat annually *due ?Bone Density: Done 04/07/2019 - repeat in 3 years with GYN ?Recommended yearly ophthalmology/optometry visit for glaucoma screening and checkup ?Recommended yearly dental visit for hygiene and checkup ? ?Vaccinations: ?Influenza vaccine: Done 08/14/2020 - recommend repeat every fall* ?Pneumococcal vaccine: Done 05/04/2015 - due for Pneumovax-23* ?Tdap vaccine: Done 08/28/2011 - Repeat in 10 years *due ?Shingles vaccine: Done 04/13/2020 - due for second dose*  ?Covid-19: Done 10/31/2019 & 11/21/2019 ? ?Advanced directives: Advance directive discussed with you today. Even though you declined this today, please call our office should you change your mind, and we can give you the proper paperwork for you to fill out.  ? ?Conditions/risks identified: Aim for 30 minutes of exercise, 6-8 glasses of water, and 5 servings of fruits and vegetables each day. Try seated and chair exercises, stretches. ? ?Next appointment: Follow up in one year for your annual wellness visit.  ? ?Preventive Care 40-64 Years, Female ?Preventive care refers to lifestyle choices and visits with your health care provider that can promote health and wellness. ?What does preventive care include? ?A yearly physical exam. This is also called an annual well check. ?Dental exams once or twice a year. ?Routine eye exams. Ask your health care provider how often you should have your eyes checked. ?Personal lifestyle choices, including: ?Daily care of your teeth and gums. ?Regular physical activity. ?Eating a healthy diet. ?Avoiding tobacco and drug use. ?Limiting alcohol use. ?Practicing  safe sex. ?Taking low-dose aspirin daily starting at age 55. ?Taking vitamin and mineral supplements as recommended by your health care provider. ?What happens during an annual well check? ?The services and screenings done by your health care provider during your annual well check will depend on your age, overall health, lifestyle risk factors, and family history of disease. ?Counseling  ?Your health care provider may ask you questions about your: ?Alcohol use. ?Tobacco use. ?Drug use. ?Emotional well-being. ?Home and relationship well-being. ?Sexual activity. ?Eating habits. ?Work and work Statistician. ?Method of birth control. ?Menstrual cycle. ?Pregnancy history. ?Screening  ?You may have the following tests or measurements: ?Height, weight, and BMI. ?Blood pressure. ?Lipid and cholesterol levels. These may be checked every 5 years, or more frequently if you are over 4 years old. ?Skin check. ?Lung cancer screening. You may have this screening every year starting at age 90 if you have a 30-pack-year history of smoking and currently smoke or have quit within the past 15 years. ?Fecal occult blood test (FOBT) of the stool. You may have this test every year starting at age 62. ?Flexible sigmoidoscopy or colonoscopy. You may have a sigmoidoscopy every 5 years or a colonoscopy every 10 years starting at age 82. ?Hepatitis C blood test. ?Hepatitis B blood test. ?Sexually transmitted disease (STD) testing. ?Diabetes screening. This is done by checking your blood sugar (glucose) after you have not eaten for a while (fasting). You may have this done every 1-3 years. ?Mammogram. This may be done every 1-2 years. Talk to your health care provider about when you should start having regular mammograms. This may depend on whether you have a family history of breast cancer. ?BRCA-related cancer screening. This  may be done if you have a family history of breast, ovarian, tubal, or peritoneal cancers. ?Pelvic exam and Pap test.  This may be done every 3 years starting at age 28. Starting at age 75, this may be done every 5 years if you have a Pap test in combination with an HPV test. ?Bone density scan. This is done to screen for osteoporosis. You may have this scan if you are at high risk for osteoporosis. ?Discuss your test results, treatment options, and if necessary, the need for more tests with your health care provider. ?Vaccines  ?Your health care provider may recommend certain vaccines, such as: ?Influenza vaccine. This is recommended every year. ?Tetanus, diphtheria, and acellular pertussis (Tdap, Td) vaccine. You may need a Td booster every 10 years. ?Zoster vaccine. You may need this after age 12. ?Pneumococcal 13-valent conjugate (PCV13) vaccine. You may need this if you have certain conditions and were not previously vaccinated. ?Pneumococcal polysaccharide (PPSV23) vaccine. You may need one or two doses if you smoke cigarettes or if you have certain conditions. ?Talk to your health care provider about which screenings and vaccines you need and how often you need them. ?This information is not intended to replace advice given to you by your health care provider. Make sure you discuss any questions you have with your health care provider. ?Document Released: 08/24/2015 Document Revised: 04/16/2016 Document Reviewed: 05/29/2015 ?Elsevier Interactive Patient Education ? 2017 Elsevier [employer]. ? ? ? ?Fall Prevention in the Home ?Falls can cause injuries. They can happen to people of all ages. There are many things you can do to make your home safe and to help prevent falls. ?What can I do on the outside of my home? ?Regularly fix the edges of walkways and driveways and fix any cracks. ?Remove anything that might make you trip as you walk through a door, such as a raised step or threshold. ?Trim any bushes or trees on the path to your home. ?Use bright outdoor lighting. ?Clear any walking paths of anything that might make someone trip,  such as rocks or tools. ?Regularly check to see if handrails are loose or broken. Make sure that both sides of any steps have handrails. ?Any raised decks and porches should have guardrails on the edges. ?Have any leaves, snow, or ice cleared regularly. ?Use sand or salt on walking paths during winter. ?Clean up any spills in your garage right away. This includes oil or grease spills. ?What can I do in the bathroom? ?Use night lights. ?Install grab bars by the toilet and in the tub and shower. Do not use towel bars as grab bars. ?Use non-skid mats or decals in the tub or shower. ?If you need to sit down in the shower, use a plastic, non-slip stool. ?Keep the floor dry. Clean up any water that spills on the floor as soon as it happens. ?Remove soap buildup in the tub or shower regularly. ?Attach bath mats securely with double-sided non-slip rug tape. ?Do not have throw rugs and other things on the floor that can make you trip. ?What can I do in the bedroom? ?Use night lights. ?Make sure that you have a light by your bed that is easy to reach. ?Do not use any sheets or blankets that are too big for your bed. They should not hang down onto the floor. ?Have a firm chair that has side arms. You can use this for support while you get dressed. ?Do not have throw rugs and other  things on the floor that can make you trip. ?What can I do in the kitchen? ?Clean up any spills right away. ?Avoid walking on wet floors. ?Keep items that you use a lot in easy-to-reach places. ?If you need to reach something above you, use a strong step stool that has a grab bar. ?Keep electrical cords out of the way. ?Do not use floor polish or wax that makes floors slippery. If you must use wax, use non-skid floor wax. ?Do not have throw rugs and other things on the floor that can make you trip. ?What can I do with my stairs? ?Do not leave any items on the stairs. ?Make sure that there are handrails on both sides of the stairs and use them. Fix  handrails that are broken or loose. Make sure that handrails are as long as the stairways. ?Check any carpeting to make sure that it is firmly attached to the stairs. Fix any carpet that is loose or wor

## 2021-11-19 ENCOUNTER — Ambulatory Visit: Payer: Medicare PPO | Admitting: Internal Medicine

## 2021-11-19 DIAGNOSIS — M5414 Radiculopathy, thoracic region: Secondary | ICD-10-CM | POA: Diagnosis not present

## 2021-11-19 DIAGNOSIS — M546 Pain in thoracic spine: Secondary | ICD-10-CM | POA: Diagnosis not present

## 2022-02-21 DIAGNOSIS — H18523 Epithelial (juvenile) corneal dystrophy, bilateral: Secondary | ICD-10-CM | POA: Diagnosis not present

## 2022-02-21 DIAGNOSIS — H5213 Myopia, bilateral: Secondary | ICD-10-CM | POA: Diagnosis not present

## 2022-02-21 DIAGNOSIS — H04123 Dry eye syndrome of bilateral lacrimal glands: Secondary | ICD-10-CM | POA: Diagnosis not present

## 2022-02-21 DIAGNOSIS — H524 Presbyopia: Secondary | ICD-10-CM | POA: Diagnosis not present

## 2022-02-21 DIAGNOSIS — H16223 Keratoconjunctivitis sicca, not specified as Sjogren's, bilateral: Secondary | ICD-10-CM | POA: Diagnosis not present

## 2022-02-26 DIAGNOSIS — R32 Unspecified urinary incontinence: Secondary | ICD-10-CM | POA: Diagnosis not present

## 2022-02-26 DIAGNOSIS — Z809 Family history of malignant neoplasm, unspecified: Secondary | ICD-10-CM | POA: Diagnosis not present

## 2022-02-26 DIAGNOSIS — I10 Essential (primary) hypertension: Secondary | ICD-10-CM | POA: Diagnosis not present

## 2022-02-26 DIAGNOSIS — E669 Obesity, unspecified: Secondary | ICD-10-CM | POA: Diagnosis not present

## 2022-02-26 DIAGNOSIS — Z604 Social exclusion and rejection: Secondary | ICD-10-CM | POA: Diagnosis not present

## 2022-02-26 DIAGNOSIS — Z825 Family history of asthma and other chronic lower respiratory diseases: Secondary | ICD-10-CM | POA: Diagnosis not present

## 2022-02-26 DIAGNOSIS — F321 Major depressive disorder, single episode, moderate: Secondary | ICD-10-CM | POA: Diagnosis not present

## 2022-02-26 DIAGNOSIS — Z7982 Long term (current) use of aspirin: Secondary | ICD-10-CM | POA: Diagnosis not present

## 2022-02-26 DIAGNOSIS — H04129 Dry eye syndrome of unspecified lacrimal gland: Secondary | ICD-10-CM | POA: Diagnosis not present

## 2022-02-26 DIAGNOSIS — Z8249 Family history of ischemic heart disease and other diseases of the circulatory system: Secondary | ICD-10-CM | POA: Diagnosis not present

## 2022-02-26 DIAGNOSIS — Z791 Long term (current) use of non-steroidal anti-inflammatories (NSAID): Secondary | ICD-10-CM | POA: Diagnosis not present

## 2022-02-26 DIAGNOSIS — F411 Generalized anxiety disorder: Secondary | ICD-10-CM | POA: Diagnosis not present

## 2022-02-26 DIAGNOSIS — Z823 Family history of stroke: Secondary | ICD-10-CM | POA: Diagnosis not present

## 2022-02-26 DIAGNOSIS — Z6837 Body mass index (BMI) 37.0-37.9, adult: Secondary | ICD-10-CM | POA: Diagnosis not present

## 2022-02-26 DIAGNOSIS — E039 Hypothyroidism, unspecified: Secondary | ICD-10-CM | POA: Diagnosis not present

## 2022-02-26 DIAGNOSIS — J441 Chronic obstructive pulmonary disease with (acute) exacerbation: Secondary | ICD-10-CM | POA: Diagnosis not present

## 2022-02-26 DIAGNOSIS — E785 Hyperlipidemia, unspecified: Secondary | ICD-10-CM | POA: Diagnosis not present

## 2022-02-26 DIAGNOSIS — G40909 Epilepsy, unspecified, not intractable, without status epilepticus: Secondary | ICD-10-CM | POA: Diagnosis not present

## 2022-03-03 ENCOUNTER — Telehealth: Payer: Self-pay | Admitting: Primary Care

## 2022-03-03 ENCOUNTER — Emergency Department (HOSPITAL_BASED_OUTPATIENT_CLINIC_OR_DEPARTMENT_OTHER): Payer: Medicare PPO

## 2022-03-03 ENCOUNTER — Observation Stay (HOSPITAL_BASED_OUTPATIENT_CLINIC_OR_DEPARTMENT_OTHER)
Admission: EM | Admit: 2022-03-03 | Discharge: 2022-03-05 | Disposition: A | Discharge status: Home / Self Care | Payer: Medicare PPO | Attending: Internal Medicine | Admitting: Internal Medicine

## 2022-03-03 ENCOUNTER — Emergency Department (HOSPITAL_BASED_OUTPATIENT_CLINIC_OR_DEPARTMENT_OTHER): Payer: Medicare PPO | Admitting: Radiology

## 2022-03-03 ENCOUNTER — Other Ambulatory Visit: Payer: Self-pay

## 2022-03-03 ENCOUNTER — Encounter (HOSPITAL_BASED_OUTPATIENT_CLINIC_OR_DEPARTMENT_OTHER): Payer: Self-pay | Admitting: Emergency Medicine

## 2022-03-03 DIAGNOSIS — G40909 Epilepsy, unspecified, not intractable, without status epilepticus: Secondary | ICD-10-CM

## 2022-03-03 DIAGNOSIS — E278 Other specified disorders of adrenal gland: Secondary | ICD-10-CM | POA: Diagnosis present

## 2022-03-03 DIAGNOSIS — Z7982 Long term (current) use of aspirin: Secondary | ICD-10-CM | POA: Diagnosis not present

## 2022-03-03 DIAGNOSIS — Z20822 Contact with and (suspected) exposure to covid-19: Secondary | ICD-10-CM | POA: Diagnosis not present

## 2022-03-03 DIAGNOSIS — E039 Hypothyroidism, unspecified: Secondary | ICD-10-CM | POA: Diagnosis not present

## 2022-03-03 DIAGNOSIS — E279 Disorder of adrenal gland, unspecified: Secondary | ICD-10-CM | POA: Diagnosis not present

## 2022-03-03 DIAGNOSIS — R059 Cough, unspecified: Secondary | ICD-10-CM | POA: Diagnosis not present

## 2022-03-03 DIAGNOSIS — E876 Hypokalemia: Secondary | ICD-10-CM | POA: Insufficient documentation

## 2022-03-03 DIAGNOSIS — Z87891 Personal history of nicotine dependence: Secondary | ICD-10-CM | POA: Diagnosis not present

## 2022-03-03 DIAGNOSIS — Z6833 Body mass index (BMI) 33.0-33.9, adult: Secondary | ICD-10-CM | POA: Insufficient documentation

## 2022-03-03 DIAGNOSIS — R0602 Shortness of breath: Secondary | ICD-10-CM

## 2022-03-03 DIAGNOSIS — E669 Obesity, unspecified: Secondary | ICD-10-CM | POA: Insufficient documentation

## 2022-03-03 DIAGNOSIS — R053 Chronic cough: Secondary | ICD-10-CM | POA: Diagnosis not present

## 2022-03-03 DIAGNOSIS — R042 Hemoptysis: Secondary | ICD-10-CM | POA: Diagnosis not present

## 2022-03-03 DIAGNOSIS — I10 Essential (primary) hypertension: Secondary | ICD-10-CM | POA: Diagnosis not present

## 2022-03-03 DIAGNOSIS — J209 Acute bronchitis, unspecified: Secondary | ICD-10-CM | POA: Insufficient documentation

## 2022-03-03 DIAGNOSIS — J9811 Atelectasis: Secondary | ICD-10-CM | POA: Diagnosis not present

## 2022-03-03 DIAGNOSIS — Z79899 Other long term (current) drug therapy: Secondary | ICD-10-CM | POA: Diagnosis not present

## 2022-03-03 DIAGNOSIS — R918 Other nonspecific abnormal finding of lung field: Secondary | ICD-10-CM | POA: Diagnosis not present

## 2022-03-03 HISTORY — DX: Gastro-esophageal reflux disease without esophagitis: K21.9

## 2022-03-03 HISTORY — DX: Hemoptysis: R04.2

## 2022-03-03 LAB — COMPREHENSIVE METABOLIC PANEL
ALT: 14 U/L (ref 0–44)
AST: 18 U/L (ref 15–41)
Albumin: 4.6 g/dL (ref 3.5–5.0)
Alkaline Phosphatase: 90 U/L (ref 38–126)
Anion gap: 10 (ref 5–15)
BUN: 11 mg/dL (ref 6–20)
CO2: 24 mmol/L (ref 22–32)
Calcium: 10 mg/dL (ref 8.9–10.3)
Chloride: 97 mmol/L — ABNORMAL LOW (ref 98–111)
Creatinine, Ser: 0.68 mg/dL (ref 0.44–1.00)
GFR, Estimated: >60 mL/min (ref >60)
Glucose, Bld: 139 mg/dL — ABNORMAL HIGH (ref 70–99)
Potassium: 4 mmol/L (ref 3.5–5.1)
Sodium: 131 mmol/L — ABNORMAL LOW (ref 135–145)
Total Bilirubin: 0.4 mg/dL (ref 0.3–1.2)
Total Protein: 7 g/dL (ref 6.5–8.1)

## 2022-03-03 LAB — CBC WITH DIFFERENTIAL/PLATELET
Abs Immature Granulocytes: 0.01 10*3/uL (ref 0.00–0.07)
Basophils Absolute: 0 10*3/uL (ref 0.0–0.1)
Basophils Relative: 1 %
Eosinophils Absolute: 0.1 10*3/uL (ref 0.0–0.5)
Eosinophils Relative: 2 %
HCT: 38.8 % (ref 36.0–46.0)
Hemoglobin: 13.5 g/dL (ref 12.0–15.0)
Immature Granulocytes: 0 %
Lymphocytes Relative: 31 %
Lymphs Abs: 1.9 10*3/uL (ref 0.7–4.0)
MCH: 32.8 pg (ref 26.0–34.0)
MCHC: 34.8 g/dL (ref 30.0–36.0)
MCV: 94.4 fL (ref 80.0–100.0)
Monocytes Absolute: 0.5 10*3/uL (ref 0.1–1.0)
Monocytes Relative: 7 %
Neutro Abs: 3.7 10*3/uL (ref 1.7–7.7)
Neutrophils Relative %: 59 %
Platelets: 284 10*3/uL (ref 150–400)
RBC: 4.11 MIL/uL (ref 3.87–5.11)
RDW: 11.9 % (ref 11.5–15.5)
WBC: 6.2 10*3/uL (ref 4.0–10.5)
nRBC: 0 % (ref 0.0–0.2)

## 2022-03-03 LAB — PROTIME-INR
INR: 0.9 (ref 0.8–1.2)
Prothrombin Time: 12.4 seconds (ref 11.4–15.2)

## 2022-03-03 LAB — RESP PANEL BY RT-PCR (FLU A&B, COVID) ARPGX2
Influenza A by PCR: NEGATIVE
Influenza B by PCR: NEGATIVE
SARS Coronavirus 2 by RT PCR: NEGATIVE

## 2022-03-03 MED ORDER — IOHEXOL 300 MG/ML  SOLN
100.0000 mL | Freq: Once | INTRAMUSCULAR | Status: AC | PRN
  Administered 2022-03-03: 75 mL via INTRAVENOUS

## 2022-03-03 NOTE — Telephone Encounter (Signed)
[facility] Day - Client TELEPHONE ADVICE RECORD AccessNurse Patient Name: Jamie Patterson Gender: Female DOB: 1960/09/08 Age: 61 Y 2 M 15 D Return Phone Number: [phone] (Primary) Address: City/ State/ Zip: [place]  [address] Client [facility] Day - Client Client Site  [facility] - Day Provider Vernona Rieger - NP Contact Type Call Who Is Calling Patient / Member / Family / Caregiver Call Type Triage / Clinical Relationship To Patient Self Return Phone Number [phone] (Primary) Chief Complaint Coughing Up Blood Reason for Call Symptomatic / Request for Health Information Initial Comment Caller states she has been coughing up blood for thirty mins. Translation No Nurse Assessment Nurse: Andrey Campanile, RN, Marylene Land Date/Time Lamount Cohen Time): 03/03/2022 12:59:56 PM Confirm and document reason for call. If symptomatic, describe symptoms. ---Caller states she has been coughing up blood for 45 minutes. More than just streaks of blood. No fever. Does the patient have any new or worsening symptoms? ---Yes Will a triage be completed? ---Yes Related visit to physician within the last 2 weeks? ---No Does the PT have any chronic conditions? (i.e. diabetes, asthma, this includes High risk factors for pregnancy, etc.) ---Yes List chronic conditions. ---Depression and Anxiety, HTN, High cholesterol Is this a behavioral health or substance abuse call? ---No Guidelines Guideline Title Affirmed Question Affirmed Notes Nurse Date/Time (Eastern Time) Coughing Up Blood History of inherited increased risk of blood clots (e.g., Factor 5 Leiden, Anti-thrombin 3, Protein C or Protein S deficiency, Prothrombin mutation) Andrey Campanile, RN, Marylene Land 03/03/2022 1:01:45 PM PLEASE NOTE: All timestamps contained within this report are represented as [place] Standard Time. CONFIDENTIALTY NOTICE: This fax transmission is intended only  for the addressee. It contains information that is legally privileged, confidential or otherwise protected from use or disclosure. If you are not the intended recipient, you are strictly prohibited from reviewing, disclosing, copying using or disseminating any of this information or taking any action in reliance on or regarding this information. If you have received this fax in error, please notify us immediately by telephone so that we can arrange for its return to [place]. Phone: [phone], Toll-Free: [phone], Fax: [phone] Page: 2 of 2 Call Id: [id number] Disp. Time Lamount Cohen Time) Disposition Final User 03/03/2022 1:04:36 PM Go to ED Now Yes Andrey Campanile, RN, Marylene Land Final Disposition 03/03/2022 1:04:36 PM Go to ED Now Yes Andrey Campanile, RN, Rosalyn Charters Disagree/Comply Comply Caller Understands Yes PreDisposition Did not know what to do Care Advice Given Per Guideline GO TO ED NOW: * You need to be seen in the Emergency Department. SAMPLE: * Bring in a sample of the blood for testing (R/ O false positive). BRING MEDICINES: * Please bring a list of your current medicines when you go to see the doctor. * It is also a good idea to bring the pill bottles too. This will help the doctor to make certain you are taking the right medicines and the right dose. Referrals GO TO FACILITY UNDECIDE

## 2022-03-03 NOTE — Telephone Encounter (Signed)
Noted.  Patient will need follow up once out of ED/hospital. Will await ED notes.

## 2022-03-03 NOTE — ED Triage Notes (Signed)
Pt arrives to ED with c/o hemoptysis. Pt hx of chronic cough. Pt reports two hours ago she coughed up blood 6 to 7 times. Pt denies CP, SOB, fevers, night sweats, chills, and Pain.

## 2022-03-03 NOTE — ED Provider Notes (Signed)
[facility] EMERGENCY DEPT Provider Note   CSN: [id number] Arrival date & time: 03/03/22  1356     History  Chief Complaint  Patient presents with   Hemoptysis    Jamie Patterson is a 61 y.o. female.  Patient is a 61 year old female who presents with hemoptysis.  She says she has had a chronic cough for a while.  She has seen pulmonology at [facility] pulmonology.  She was started on medication for GERD thinking that that may be the etiology.  She says she has not really had any change in the coughing.  Today she had her normal cough and actually started coughing up some frank hemoptysis.  There is no mucus associated with it.  She had multiple episodes of frank hemoptysis that lasted about an hour or so.  She says when she got to the ED it seems to have started to ease off.  Now she only coughs little specks of blood.  She does not have any associated chest pain or shortness of breath.  No fevers.  No runny nose or congestion.  No night sweats.  No chills.  No leg swelling.  No dizziness.       Home Medications Prior to Admission medications   Medication Sig Start Date End Date Taking? Authorizing Provider  acetaminophen (TYLENOL) 500 MG tablet Take 500 mg by mouth as needed.    [provider]  albuterol (VENTOLIN HFA) 108 (90 Base) MCG/ACT inhaler Inhale 2 puffs into the lungs every 6 (six) hours as needed for wheezing or shortness of breath. 11/15/20 11/15/21  Copland, Karleen Hampshire, MD  Ascorbic Acid (VITAMIN C) 1000 MG tablet Take 1,000 mg by mouth daily.    [provider]  aspirin EC 81 MG tablet Take 81 mg by mouth every evening.    [provider]  cholecalciferol (VITAMIN D3) 25 MCG (1000 UNIT) tablet Take 1,000 Units by mouth daily.    [provider]  cycloSPORINE (RESTASIS) 0.05 % ophthalmic emulsion Place 1 drop into both eyes 2 (two) times daily.    [provider]  DULoxetine (CYMBALTA) 60 MG capsule Take 60 mg by mouth 2 (two)  times daily.     [provider]  escitalopram (LEXAPRO) 10 MG tablet Take 10 mg by mouth daily.  10/01/15   [provider]  ferrous sulfate 325 (65 FE) MG tablet Take 325 mg by mouth daily with breakfast.    [provider]  gabapentin (NEURONTIN) 300 MG capsule Take 300 mg by mouth. Take 1 capsule in the morning and 2 capsules in the evening.    [provider]  hydrochlorothiazide (HYDRODIURIL) 25 MG tablet TAKE ONE TABLET BY MOUTH DAILY FOR BLOODPRESSURE 03/19/21   Doreene Nest, NP  lamoTRIgine (LAMICTAL) 150 MG tablet TAKE 1 TABLET BY MOUTH TWICE A DAY 04/03/17   Tat, Octaviano Batty, DO  levothyroxine (SYNTHROID) 50 MCG tablet Take 1 tablet by mouth every morning on an empty stomach with water only.  No food or other medications for 30 minutes. 03/19/21   Doreene Nest, NP  MAGNESIUM PO Take 400 mg by mouth 3 (three) times daily.     [provider]  Melatonin 10 MG TABS Take 2 tablets by mouth at bedtime.     [provider]  montelukast (SINGULAIR) 10 MG tablet TAKE 1 TABLET BY MOUTH AT BEDTIME. FOR COUGH AND ALLERGIES 03/19/21   Doreene Nest, NP  Multiple Vitamin (MULTIVITAMIN) tablet Take 1 tablet  by mouth daily.    [provider]  naproxen sodium (ANAPROX) 220 MG tablet Take 440 mg by mouth as needed.    [provider]  olmesartan (BENICAR) 20 MG tablet TAKE 1 TABLET BY MOUTH ONCE A DAY FOR BLOOD PRESSURE 05/21/21   Doreene Nest, NP  Omega-3 Fatty Acids (FISH OIL PO) Take 1 tablet by mouth daily.     [provider]  pantoprazole (PROTONIX) 40 MG tablet Take 1 tablet (40 mg total) by mouth daily. 01/14/21   Charlott Holler, MD  primidone (MYSOLINE) 50 MG tablet Take 1 tablet (50 mg total) by mouth in the morning and at bedtime. 04/04/21   Tat, Octaviano Batty, DO  propranolol (INDERAL) 40 MG tablet Take 1 tablet (40 mg total) by mouth daily. 04/04/21   Tat, Octaviano Batty, DO  rosuvastatin (CRESTOR) 10 MG tablet  TAKE 1 TABLET BY MOUTH ONCE EVERY EVENING FOR CHOLESTEROL 03/19/21   Doreene Nest, NP  tiZANidine (ZANAFLEX) 4 MG tablet Take by mouth. 09/24/21   [provider]  vitamin B-12 (CYANOCOBALAMIN) 1000 MCG tablet Take 500 mcg by mouth daily.    [provider]      Allergies    Amlodipine    Review of Systems   Review of Systems  Constitutional:  Negative for chills, diaphoresis, fatigue and fever.  HENT:  Negative for congestion, rhinorrhea and sneezing.   Eyes: Negative.   Respiratory:  Positive for cough. Negative for chest tightness and shortness of breath.        Hemoptysis  Cardiovascular:  Negative for chest pain and leg swelling.  Gastrointestinal:  Negative for abdominal pain, blood in stool, diarrhea, nausea and vomiting.  Genitourinary:  Negative for difficulty urinating, flank pain, frequency and hematuria.  Musculoskeletal:  Negative for arthralgias and back pain.  Skin:  Negative for rash.  Neurological:  Negative for dizziness, speech difficulty, weakness, numbness and headaches.    Physical Exam Updated Vital Signs BP (!) 146/92   Pulse 75   Temp 98.1 F (36.7 C) (Oral)   Resp 16   Ht 5\' 6"  (1.676 m)   Wt 93 kg   LMP 06/01/2013   SpO2 97%   BMI 33.09 kg/m  Physical Exam Constitutional:      Appearance: She is well-developed.  HENT:     Head: Normocephalic and atraumatic.  Eyes:     Pupils: Pupils are equal, round, and reactive to light.  Cardiovascular:     Rate and Rhythm: Normal rate and regular rhythm.     Heart sounds: Normal heart sounds.  Pulmonary:     Effort: Pulmonary effort is normal. No respiratory distress.     Breath sounds: Normal breath sounds. No wheezing or rales.  Chest:     Chest wall: No tenderness.  Abdominal:     General: Bowel sounds are normal.     Palpations: Abdomen is soft.     Tenderness: There is no abdominal tenderness. There is no guarding or rebound.  Musculoskeletal:        General: Normal range  of motion.     Cervical back: Normal range of motion and neck supple.     Comments: No edema or calf tenderness  Lymphadenopathy:     Cervical: No cervical adenopathy.  Skin:    General: Skin is warm and dry.     Findings: No rash.  Neurological:     Mental Status: She is alert and oriented to person, place, and  time.      ED Results / Procedures / Treatments   Labs (all labs ordered are listed, but only abnormal results are displayed) Labs Reviewed  COMPREHENSIVE METABOLIC PANEL - Abnormal; Notable for the following components:      Result Value   Sodium 131 (*)    Chloride 97 (*)    Glucose, Bld 139 (*)    All other components within normal limits  RESP PANEL BY RT-PCR (FLU A&B, COVID) ARPGX2  CBC WITH DIFFERENTIAL/PLATELET  PROTIME-INR    EKG None  Radiology CT Chest W Contrast  Result Date: 03/03/2022 CLINICAL DATA:  Hemoptysis, new onset EXAM: CT CHEST WITH CONTRAST TECHNIQUE: Multidetector CT imaging of the chest was performed during intravenous contrast administration. RADIATION DOSE REDUCTION: This exam was performed according to the departmental dose-optimization program which includes automated exposure control, adjustment of the mA and/or kV according to patient size and/or use of iterative reconstruction technique. CONTRAST:  76mL OMNIPAQUE IOHEXOL 300 MG/ML  SOLN IV COMPARISON:  None Available. FINDINGS: Cardiovascular: Minimal atherosclerotic calcifications aorta without aneurysm or dissection. Minimal enlargement of cardiac chambers. No pericardial effusion. Pulmonary arteries grossly patent on non targeted exam. Mediastinum/Nodes: Esophagus unremarkable. No thoracic adenopathy. Base of cervical region normal appearance. Lungs/Pleura: Few scattered patchy opacities in both lungs, somewhat nodular in appearance, could represent alveolar hemorrhage greatest in superior segment RIGHT lower lobe no additional infiltrate, pleural effusion, or pneumothorax. No dominant  pulmonary mass. Linear subsegmental atelectasis BILATERAL upper lobes. Upper Abdomen: Mass identified lateral RIGHT lobe liver 3.3 x 2.2 cm, demonstrating nodular peripheral enhancement consistent with hemangioma. Additional small hypervascular focus lateral segment LEFT lobe liver question small atypical hemangioma 8 mm diameter. 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 Musculoskeletal: Mild degenerative disc disease changes at thoracolumbar junction. No acute osseous findings. IMPRESSION: Few scattered patchy opacities in both lungs, somewhat nodular in appearance, could represent alveolar hemorrhage greatest in superior segment RIGHT lower lobe though infection not excluded. Linear subsegmental atelectasis BILATERAL upper lobes. RIGHT lobe liver hemangioma 3.3 x 2.2 cm. Additional small hypervascular focus lateral segment LEFT lobe liver question small atypical hemangioma. 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 Aortic Atherosclerosis (ICD10-I70.0). Electronically Signed   By: Ulyses Southward M.D.   On: 03/03/2022 17:32   DG Chest 2 View  Result Date: 03/03/2022 CLINICAL DATA:  coughing up blood EXAM: CHEST - 2 VIEW COMPARISON:  Radiograph 09/23/2021 FINDINGS: Unchanged cardiomediastinal silhouette. Persistent right midlung streaky opacities. Left lung is clear. No new airspace disease. Mild bronchial wall thickening and lucencies which may reflect bronchiectasis or emphysema. No pleural effusion. No pneumothorax. No acute osseous abnormality. Thoracic spondylosis. IMPRESSION: Persistent streaky opacities in the right lung with mild bronchial wall thickening, findings could reflect an atypical infectious/inflammatory process. Consider chest CT as clinically indicated. Electronically Signed   By: Caprice Renshaw M.D.   On: 03/03/2022 15:08    Procedures Procedures    Medications Ordered in ED Medications  iohexol  (OMNIPAQUE) 300 MG/ML solution 100 mL (75 mLs Intravenous Contrast Given 03/03/22 1708)    ED Course/ Medical Decision Making/ A&P                           Medical Decision Making Amount and/or Complexity of Data Reviewed Labs: ordered. Radiology: ordered.  Risk Prescription drug management. Decision regarding hospitalization.   Patient is a 61 year old  female who presents with hemoptysis.  She had a respectable amount of hemoptysis over about an hour.  There is no associated mucus.  No shortness of breath or chest pain.  Chest x-ray was performed which showed streaky opacities bilaterally.  This was interpreted by the radiologist and me as well.  She had a CT scan of her chest which showed some alveolar hemorrhage but otherwise no acute findings.  Her labs are nonconcerning.  I spoke with the on-call pulmonology APP.  This was discussed with the attending physician and it was recommended that patient be admitted by the hospitalist overnight for observation.  They advised that pulmonology can be consulted if hemoptysis continues.  Otherwise patient can likely be discharged after period of observation.  I spoke with Dr. Allena Katz with the hospitalist service he will admit the patient for further treatment.  Final Clinical Impression(s) / ED Diagnoses Final diagnoses:  Hemoptysis    Rx / DC Orders ED Discharge Orders     None         Rolan Bucco, MD 03/03/22 1927

## 2022-03-03 NOTE — Telephone Encounter (Signed)
Patient called in stating she has been coughing up blood for 30 minutes. Sent over to triage.

## 2022-03-03 NOTE — Telephone Encounter (Signed)
Noted, will leave for pcp review. Agree with need for evaluation

## 2022-03-03 NOTE — Progress Notes (Signed)
Case and media tab reviewed. 20 pack year smoker  Make NPO MN.   Pulm will see in AM, probably deserves at least an airway inspection with that amount of hemoptysis.  Myrla Halsted MD PCCM

## 2022-03-03 NOTE — Telephone Encounter (Signed)
Per chart review tab pt is presently at [facility] ED. Sending note to Allayne Gitelman NP who is out of office today and Dr Selena Batten who is in office today and [employer] CMA.

## 2022-03-04 ENCOUNTER — Observation Stay (HOSPITAL_BASED_OUTPATIENT_CLINIC_OR_DEPARTMENT_OTHER): Payer: Medicare PPO

## 2022-03-04 ENCOUNTER — Observation Stay (HOSPITAL_BASED_OUTPATIENT_CLINIC_OR_DEPARTMENT_OTHER): Payer: Medicare PPO | Admitting: Anesthesiology

## 2022-03-04 ENCOUNTER — Observation Stay (HOSPITAL_COMMUNITY): Payer: Medicare PPO | Admitting: Anesthesiology

## 2022-03-04 ENCOUNTER — Encounter (HOSPITAL_COMMUNITY)
Admission: EM | Disposition: A | Discharge status: Home / Self Care | Payer: Self-pay | Source: Home / Self Care | Attending: Emergency Medicine

## 2022-03-04 ENCOUNTER — Observation Stay (HOSPITAL_COMMUNITY): Payer: Medicare PPO

## 2022-03-04 ENCOUNTER — Encounter (HOSPITAL_COMMUNITY): Payer: Self-pay | Admitting: Internal Medicine

## 2022-03-04 DIAGNOSIS — E039 Hypothyroidism, unspecified: Secondary | ICD-10-CM

## 2022-03-04 DIAGNOSIS — G40909 Epilepsy, unspecified, not intractable, without status epilepticus: Secondary | ICD-10-CM | POA: Diagnosis not present

## 2022-03-04 DIAGNOSIS — I10 Essential (primary) hypertension: Secondary | ICD-10-CM

## 2022-03-04 DIAGNOSIS — G319 Degenerative disease of nervous system, unspecified: Secondary | ICD-10-CM | POA: Diagnosis not present

## 2022-03-04 DIAGNOSIS — R053 Chronic cough: Secondary | ICD-10-CM | POA: Diagnosis not present

## 2022-03-04 DIAGNOSIS — E278 Other specified disorders of adrenal gland: Secondary | ICD-10-CM

## 2022-03-04 DIAGNOSIS — R609 Edema, unspecified: Secondary | ICD-10-CM

## 2022-03-04 DIAGNOSIS — Z87891 Personal history of nicotine dependence: Secondary | ICD-10-CM | POA: Diagnosis not present

## 2022-03-04 DIAGNOSIS — R042 Hemoptysis: Secondary | ICD-10-CM

## 2022-03-04 HISTORY — PX: BRONCHIAL WASHINGS: SHX5105

## 2022-03-04 HISTORY — PX: VIDEO BRONCHOSCOPY: SHX5072

## 2022-03-04 LAB — BODY FLUID CELL COUNT WITH DIFFERENTIAL
Eos, Fluid: 0 %
Lymphs, Fluid: 6 %
Monocyte-Macrophage-Serous Fluid: 8 % — ABNORMAL LOW (ref 50–90)
Neutrophil Count, Fluid: 86 % — ABNORMAL HIGH (ref 0–25)
Total Nucleated Cell Count, Fluid: 526 cu mm (ref 0–1000)

## 2022-03-04 LAB — BASIC METABOLIC PANEL
Anion gap: 8 (ref 5–15)
BUN: 10 mg/dL (ref 6–20)
CO2: 27 mmol/L (ref 22–32)
Calcium: 9.5 mg/dL (ref 8.9–10.3)
Chloride: 100 mmol/L (ref 98–111)
Creatinine, Ser: 0.72 mg/dL (ref 0.44–1.00)
GFR, Estimated: >60 mL/min (ref >60)
Glucose, Bld: 117 mg/dL — ABNORMAL HIGH (ref 70–99)
Potassium: 3.5 mmol/L (ref 3.5–5.1)
Sodium: 135 mmol/L (ref 135–145)

## 2022-03-04 LAB — CBC
HCT: 37.3 % (ref 36.0–46.0)
Hemoglobin: 12.6 g/dL (ref 12.0–15.0)
MCH: 32.8 pg (ref 26.0–34.0)
MCHC: 33.8 g/dL (ref 30.0–36.0)
MCV: 97.1 fL (ref 80.0–100.0)
Platelets: 221 10*3/uL (ref 150–400)
RBC: 3.84 MIL/uL — ABNORMAL LOW (ref 3.87–5.11)
RDW: 12.2 % (ref 11.5–15.5)
WBC: 7 10*3/uL (ref 4.0–10.5)
nRBC: 0 % (ref 0.0–0.2)

## 2022-03-04 LAB — RESPIRATORY PANEL BY PCR

## 2022-03-04 LAB — SEDIMENTATION RATE: Sed Rate: 38 mm/hr — ABNORMAL HIGH (ref 0–22)

## 2022-03-04 LAB — PROCALCITONIN: Procalcitonin: <0.1 ng/mL

## 2022-03-04 LAB — HIV ANTIBODY (ROUTINE TESTING W REFLEX): HIV Screen 4th Generation wRfx: NONREACTIVE

## 2022-03-04 LAB — D-DIMER, QUANTITATIVE: D-Dimer, Quant: 0.35 ug/mL-FEU (ref 0.00–0.50)

## 2022-03-04 SURGERY — VIDEO BRONCHOSCOPY WITHOUT FLUORO
Anesthesia: General | Laterality: Bilateral

## 2022-03-04 MED ORDER — ROCURONIUM BROMIDE 10 MG/ML (PF) SYRINGE
PREFILLED_SYRINGE | INTRAVENOUS | Status: DC | PRN
  Administered 2022-03-04: 70 mg via INTRAVENOUS

## 2022-03-04 MED ORDER — VITAMIN D 25 MCG (1000 UNIT) PO TABS
1000.0000 [IU] | ORAL_TABLET | Freq: Every day | ORAL | Status: DC
  Administered 2022-03-04 – 2022-03-05 (×2): 1000 [IU] via ORAL
  Filled 2022-03-04 (×2): qty 1

## 2022-03-04 MED ORDER — GABAPENTIN 300 MG PO CAPS: 300.0000 mg | ORAL_CAPSULE | ORAL | Status: DC

## 2022-03-04 MED ORDER — MELATONIN 5 MG PO TABS
10.0000 mg | ORAL_TABLET | Freq: Every day | ORAL | Status: DC
  Administered 2022-03-04: 10 mg via ORAL
  Filled 2022-03-04: qty 2

## 2022-03-04 MED ORDER — PROPOFOL 10 MG/ML IV BOLUS
INTRAVENOUS | Status: DC | PRN
  Administered 2022-03-04: 100 mg via INTRAVENOUS

## 2022-03-04 MED ORDER — FERROUS SULFATE 325 (65 FE) MG PO TABS
325.0000 mg | ORAL_TABLET | Freq: Every day | ORAL | Status: DC
  Administered 2022-03-04 – 2022-03-05 (×2): 325 mg via ORAL
  Filled 2022-03-04 (×2): qty 1

## 2022-03-04 MED ORDER — ONDANSETRON HCL 4 MG PO TABS: 4.0000 mg | ORAL_TABLET | Freq: Four times a day (QID) | ORAL | Status: DC | PRN

## 2022-03-04 MED ORDER — ACETAMINOPHEN 650 MG RE SUPP: 650.0000 mg | Freq: Four times a day (QID) | RECTAL | Status: DC | PRN

## 2022-03-04 MED ORDER — ONDANSETRON HCL 4 MG/2ML IJ SOLN: 4.0000 mg | Freq: Four times a day (QID) | INTRAMUSCULAR | Status: DC | PRN

## 2022-03-04 MED ORDER — LIDOCAINE 2% (20 MG/ML) 5 ML SYRINGE
INTRAMUSCULAR | Status: DC | PRN
  Administered 2022-03-04: 60 mg via INTRAVENOUS

## 2022-03-04 MED ORDER — ESCITALOPRAM OXALATE 10 MG PO TABS
10.0000 mg | ORAL_TABLET | Freq: Every day | ORAL | Status: DC
  Administered 2022-03-04 – 2022-03-05 (×2): 10 mg via ORAL
  Filled 2022-03-04 (×2): qty 1

## 2022-03-04 MED ORDER — MONTELUKAST SODIUM 10 MG PO TABS
10.0000 mg | ORAL_TABLET | Freq: Every day | ORAL | Status: DC
  Administered 2022-03-04: 10 mg via ORAL
  Filled 2022-03-04: qty 1

## 2022-03-04 MED ORDER — LIDOCAINE HCL URETHRAL/MUCOSAL 2 % EX GEL
CUTANEOUS | Status: AC
  Filled 2022-03-04: qty 30

## 2022-03-04 MED ORDER — GABAPENTIN 300 MG PO CAPS
300.0000 mg | ORAL_CAPSULE | Freq: Three times a day (TID) | ORAL | Status: DC
  Administered 2022-03-04 – 2022-03-05 (×4): 300 mg via ORAL
  Filled 2022-03-04 (×4): qty 1

## 2022-03-04 MED ORDER — DULOXETINE HCL 30 MG PO CPEP
60.0000 mg | ORAL_CAPSULE | Freq: Two times a day (BID) | ORAL | Status: DC
  Administered 2022-03-04 – 2022-03-05 (×3): 60 mg via ORAL
  Filled 2022-03-04 (×3): qty 2

## 2022-03-04 MED ORDER — IRBESARTAN 150 MG PO TABS: 150.0000 mg | ORAL_TABLET | Freq: Every day | ORAL | Status: DC

## 2022-03-04 MED ORDER — TRANEXAMIC ACID FOR INHALATION
500.0000 mg | Freq: Three times a day (TID) | RESPIRATORY_TRACT | Status: DC
  Administered 2022-03-04 – 2022-03-05 (×3): 500 mg via RESPIRATORY_TRACT
  Filled 2022-03-04 (×8): qty 10

## 2022-03-04 MED ORDER — CYCLOSPORINE 0.05 % OP EMUL
1.0000 [drp] | Freq: Two times a day (BID) | OPHTHALMIC | Status: DC
Start: 2022-03-04 — End: 2022-03-05
  Administered 2022-03-04 – 2022-03-05 (×3): 1 [drp] via OPHTHALMIC
  Filled 2022-03-04 (×3): qty 30

## 2022-03-04 MED ORDER — ALBUTEROL SULFATE HFA 108 (90 BASE) MCG/ACT IN AERS
2.0000 | INHALATION_SPRAY | Freq: Four times a day (QID) | RESPIRATORY_TRACT | Status: DC | PRN
Start: 2022-03-04 — End: 2022-03-04

## 2022-03-04 MED ORDER — GABAPENTIN 400 MG PO CAPS
1200.0000 mg | ORAL_CAPSULE | Freq: Every day | ORAL | Status: DC
  Administered 2022-03-04: 1200 mg via ORAL
  Filled 2022-03-04 (×2): qty 3

## 2022-03-04 MED ORDER — TIZANIDINE HCL 4 MG PO TABS
8.0000 mg | ORAL_TABLET | Freq: Every day | ORAL | Status: DC
  Administered 2022-03-04: 8 mg via ORAL
  Filled 2022-03-04: qty 2

## 2022-03-04 MED ORDER — ADULT MULTIVITAMIN W/MINERALS CH
1.0000 | ORAL_TABLET | Freq: Every day | ORAL | Status: DC
  Administered 2022-03-04 – 2022-03-05 (×2): 1 via ORAL
  Filled 2022-03-04 (×2): qty 1

## 2022-03-04 MED ORDER — ONDANSETRON HCL 4 MG/2ML IJ SOLN
INTRAMUSCULAR | Status: DC | PRN
  Administered 2022-03-04: 4 mg via INTRAVENOUS

## 2022-03-04 MED ORDER — LIDOCAINE HCL (PF) 4 % IJ SOLN
INTRAMUSCULAR | Status: DC | PRN
  Administered 2022-03-04: 5 mL via RESPIRATORY_TRACT

## 2022-03-04 MED ORDER — LEVOTHYROXINE SODIUM 50 MCG PO TABS
50.0000 ug | ORAL_TABLET | Freq: Every day | ORAL | Status: DC
  Administered 2022-03-04 – 2022-03-05 (×2): 50 ug via ORAL
  Filled 2022-03-04 (×2): qty 1

## 2022-03-04 MED ORDER — DEXAMETHASONE SODIUM PHOSPHATE 10 MG/ML IJ SOLN
INTRAMUSCULAR | Status: DC | PRN
  Administered 2022-03-04: 8 mg via INTRAVENOUS

## 2022-03-04 MED ORDER — ACETAMINOPHEN 325 MG PO TABS
650.0000 mg | ORAL_TABLET | Freq: Four times a day (QID) | ORAL | Status: DC | PRN
  Administered 2022-03-04: 650 mg via ORAL
  Filled 2022-03-04: qty 2

## 2022-03-04 MED ORDER — PROPRANOLOL HCL 20 MG PO TABS
40.0000 mg | ORAL_TABLET | Freq: Every day | ORAL | Status: DC
  Administered 2022-03-04 – 2022-03-05 (×2): 40 mg via ORAL
  Filled 2022-03-04 (×2): qty 2

## 2022-03-04 MED ORDER — LIDOCAINE HCL (PF) 4 % IJ SOLN
INTRAMUSCULAR | Status: AC
  Filled 2022-03-04: qty 5

## 2022-03-04 MED ORDER — PANTOPRAZOLE SODIUM 40 MG PO TBEC
40.0000 mg | DELAYED_RELEASE_TABLET | Freq: Every day | ORAL | Status: DC
  Administered 2022-03-04 – 2022-03-05 (×2): 40 mg via ORAL
  Filled 2022-03-04 (×2): qty 1

## 2022-03-04 MED ORDER — LACTATED RINGERS IV SOLN
INTRAVENOUS | Status: DC
  Administered 2022-03-04: 1000 mL via INTRAVENOUS

## 2022-03-04 MED ORDER — VITAMIN B-12 1000 MCG PO TABS
1000.0000 ug | ORAL_TABLET | Freq: Every day | ORAL | Status: DC
  Administered 2022-03-04 – 2022-03-05 (×2): 1000 ug via ORAL
  Filled 2022-03-04 (×2): qty 1

## 2022-03-04 MED ORDER — SUGAMMADEX SODIUM 500 MG/5ML IV SOLN
INTRAVENOUS | Status: DC | PRN
  Administered 2022-03-04: 500 mg via INTRAVENOUS

## 2022-03-04 MED ORDER — LIDOCAINE HCL URETHRAL/MUCOSAL 2 % EX GEL: 1.0000 | Freq: Once | CUTANEOUS | Status: DC

## 2022-03-04 MED ORDER — HYDROCHLOROTHIAZIDE 25 MG PO TABS: 25.0000 mg | ORAL_TABLET | Freq: Every day | ORAL | Status: DC

## 2022-03-04 MED ORDER — ROSUVASTATIN CALCIUM 10 MG PO TABS
10.0000 mg | ORAL_TABLET | Freq: Every evening | ORAL | Status: DC
  Administered 2022-03-04: 10 mg via ORAL
  Filled 2022-03-04: qty 1

## 2022-03-04 MED ORDER — HYDROCODONE BIT-HOMATROP MBR 5-1.5 MG/5ML PO SOLN
5.0000 mL | Freq: Two times a day (BID) | ORAL | Status: DC
  Administered 2022-03-04 – 2022-03-05 (×2): 5 mL via ORAL
  Filled 2022-03-04 (×2): qty 5

## 2022-03-04 MED ORDER — ALBUTEROL SULFATE (2.5 MG/3ML) 0.083% IN NEBU: 2.5000 mg | INHALATION_SOLUTION | Freq: Four times a day (QID) | RESPIRATORY_TRACT | Status: DC | PRN

## 2022-03-04 MED ORDER — LAMOTRIGINE 25 MG PO TABS
150.0000 mg | ORAL_TABLET | Freq: Two times a day (BID) | ORAL | Status: DC
  Administered 2022-03-04 – 2022-03-05 (×3): 150 mg via ORAL
  Filled 2022-03-04 (×3): qty 2

## 2022-03-04 MED ORDER — PROPOFOL 500 MG/50ML IV EMUL
INTRAVENOUS | Status: DC | PRN
  Administered 2022-03-04: 150 ug/kg/min via INTRAVENOUS

## 2022-03-04 MED ORDER — PRIMIDONE 50 MG PO TABS
50.0000 mg | ORAL_TABLET | Freq: Two times a day (BID) | ORAL | Status: DC
  Administered 2022-03-04 – 2022-03-05 (×3): 50 mg via ORAL
  Filled 2022-03-04 (×3): qty 1

## 2022-03-04 NOTE — Assessment & Plan Note (Signed)
Med rec pending ?

## 2022-03-04 NOTE — H&P (Signed)
History and Physical    Patient: Jamie Patterson BSW:[id number] DOB: 30-May-1961 DOA: 03/03/2022 DOS: the patient was seen and examined on 03/04/2022 PCP: Doreene Nest, NP  Patient coming from: Home  Chief Complaint:  Chief Complaint  Patient presents with   Hemoptysis   HPI: Jamie Patterson is a 61 y.o. female with medical history significant of chronic cough, seizures many years ago, HTN.  Pt presents to ED at [facility] with sudden and new onset hemoptysis.  Chronic cough for a while, started on meds for GERD by [facility] pulm.  No real change in coughing.  Today had normal cough, then suddenly started having frank hemoptysis (see photo below).  No mucus in blood.  Amount of bleeding significantly improved since onset.  No URI symptoms.  No chills, no fevers, no night sweats.  No CP, no SOB.  No leg swelling.    Review of Systems: As mentioned in the history of present illness. All other systems reviewed and are negative. 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   Trimalleolar fracture on right s/p fusion     Benign essential tremor    worsened by anxiety   Chronic pain due to injury    at R ankle due to ankle fracture   Depression    followed by psych   GERD (gastroesophageal reflux disease)    History of hypertension    HLD (hyperlipidemia)    Imbalance    likely multifactorial (s/p balance training at [facility] 08/2010) ?CMT   Influenza A 09/15/2018   Iron deficiency anemia    LLL pneumonia 10/05/2018   Right ankle pain 2003   trimalleolar fx s/p fusion   Seizure (HCC)    focal, ex vacuo ventriculomegaly?, pending neuro w/u with sleep deprived EEG   Syncope and collapse 08/19/2011   Past Surgical History:  Procedure Laterality Date   ANKLE FUSION  02/2004   Right-50% disability    CARPAL TUNNEL RELEASE  12/2017   L CTR repair, R thumb A1 pulley release (Gramig)   CESAREAN SECTION     HAMMER TOE SURGERY  09/12/2019    hospitalization  08/2011   syncope thought 2/2 seizure, MRI - Hyperintensity in the cerebral white matter bilaterally is nonspecific, started on Keppra   ORIF ANKLE FRACTURE  06/2003   Right   Social History:  reports that she quit smoking about 26 years ago. Her smoking use included cigarettes. She has a 20.00 pack-year smoking history. She has never used smokeless tobacco. She reports that she does not drink alcohol and does not use drugs.  Allergies  Allergen Reactions   Amlodipine Swelling    Ankle edema    Family History  Problem Relation Age of Onset   Depression Mother    Arrhythmia Mother 31       Dx with VTach after having sudden collapse, did NOT have h/o AMI's    Atrial fibrillation Father    Hypertension Father    Osteoarthritis Father    Aortic aneurysm Father    Hepatitis Brother        Hep. C   Anemia Sister     Prior to Admission medications   Medication Sig Start Date End Date Taking? Authorizing Provider  acetaminophen (TYLENOL) 500 MG tablet Take 1,000 mg by mouth daily as needed for moderate pain.   Yes [provider]  albuterol (VENTOLIN HFA) 108 (90 Base) MCG/ACT inhaler Inhale 2 puffs into  the lungs every 6 (six) hours as needed for wheezing or shortness of breath. 11/15/20 03/05/23 Yes Copland, Karleen Hampshire, MD  Ascorbic Acid (VITAMIN C) 1000 MG tablet Take 1,000 mg by mouth daily.   Yes [provider]  aspirin EC 81 MG tablet Take 81 mg by mouth every evening.   Yes [provider]  cholecalciferol (VITAMIN D3) 25 MCG (1000 UNIT) tablet Take 1,000 Units by mouth daily.   Yes [provider]  cycloSPORINE (RESTASIS) 0.05 % ophthalmic emulsion Place 1 drop into both eyes 2 (two) times daily.   Yes [provider]  DULoxetine (CYMBALTA) 60 MG capsule Take 60 mg by mouth 2 (two) times daily.    Yes [provider]  escitalopram (LEXAPRO) 10 MG tablet Take 10 mg by mouth daily.  10/01/15  Yes [provider]  ferrous sulfate 325 (65 FE) MG tablet Take 325 mg by mouth daily with breakfast.   Yes [provider]  gabapentin (NEURONTIN) 300 MG capsule Take 300 mg by mouth. Take 1 capsule in the morning and 2 capsules in the evening.    [provider]  hydrochlorothiazide (HYDRODIURIL) 25 MG tablet TAKE ONE TABLET BY MOUTH DAILY FOR BLOODPRESSURE 03/19/21   Doreene Nest, NP  lamoTRIgine (LAMICTAL) 150 MG tablet TAKE 1 TABLET BY MOUTH TWICE A DAY 04/03/17   Tat, Octaviano Batty, DO  levothyroxine (SYNTHROID) 50 MCG tablet Take 1 tablet by mouth every morning on an empty stomach with water only.  No food or other medications for 30 minutes. 03/19/21   Doreene Nest, NP  MAGNESIUM PO Take 400 mg by mouth 3 (three) times daily.     [provider]  Melatonin 10 MG TABS Take 2 tablets by mouth at bedtime.     [provider]  montelukast (SINGULAIR) 10 MG tablet TAKE 1 TABLET BY MOUTH AT BEDTIME. FOR COUGH AND ALLERGIES 03/19/21   Doreene Nest, NP  Multiple Vitamin (MULTIVITAMIN) tablet Take 1 tablet by mouth daily.    [provider]  naproxen sodium (ANAPROX) 220 MG tablet Take 440 mg by mouth as needed.    [provider]  olmesartan (BENICAR) 20 MG tablet TAKE 1 TABLET BY MOUTH ONCE A DAY FOR BLOOD PRESSURE 05/21/21   Doreene Nest, NP  Omega-3 Fatty Acids (FISH OIL PO) Take 1 tablet by mouth daily.     [provider]  pantoprazole (PROTONIX) 40 MG tablet Take 1 tablet (40 mg total) by mouth daily. 01/14/21   Charlott Holler, MD  primidone (MYSOLINE) 50 MG tablet Take 1 tablet (50 mg total) by mouth in the morning and at bedtime. 04/04/21   Tat, Octaviano Batty, DO  propranolol (INDERAL) 40 MG tablet Take 1 tablet (40 mg total) by mouth daily. 04/04/21   Tat, Octaviano Batty, DO  rosuvastatin (CRESTOR) 10 MG tablet TAKE 1 TABLET BY MOUTH ONCE EVERY EVENING FOR CHOLESTEROL 03/19/21   Doreene Nest, NP  tiZANidine (ZANAFLEX) 4 MG tablet Take by  mouth. 09/24/21   [provider]  vitamin B-12 (CYANOCOBALAMIN) 1000 MCG tablet Take 500 mcg by mouth daily.    [provider]    Physical Exam: Vitals:   03/03/22 2000 03/03/22 2050 03/03/22 2331 03/04/22 0013  BP: (!) 120/100 (!) 152/94 97/66 94/66   Pulse: 71 73 80 77  Resp:  18 18 20   Temp:  97.9 F (36.6 C) 97.6 F (36.4 C) (!) 97.5 F (36.4 C)  TempSrc:  Oral Oral Oral  SpO2: 98% 97% 96% 94%  Weight:      Height:       Constitutional: NAD, calm, comfortable Eyes: PERRL, lids and conjunctivae normal ENMT: Mucous membranes are moist. Posterior pharynx clear of any exudate or lesions.Normal dentition.  Neck: normal, supple, no masses, no thyromegaly Respiratory: clear to auscultation bilaterally, no wheezing, no crackles. Normal respiratory effort. No accessory muscle use.  Cardiovascular: Regular rate and rhythm, no murmurs / rubs / gallops. No extremity edema. 2+ pedal pulses. No carotid bruits.  Abdomen: no tenderness, no masses palpated. No hepatosplenomegaly. Bowel sounds positive.  Musculoskeletal: no clubbing / cyanosis. No joint deformity upper and lower extremities. Good ROM, no contractures. Normal muscle tone.  Skin: no rashes, lesions, ulcers. No induration Neurologic: CN 2-12 grossly intact. Sensation intact, DTR normal. Strength 5/5 in all 4.  Psychiatric: Normal judgment and insight. Alert and oriented x 3. Normal mood.     Data Reviewed:    CBC    Component Value Date/Time   WBC 6.2 03/03/2022 1424   RBC 4.11 03/03/2022 1424   HGB 13.5 03/03/2022 1424   HCT 38.8 03/03/2022 1424   PLT 284 03/03/2022 1424   MCV 94.4 03/03/2022 1424   MCH 32.8 03/03/2022 1424   MCHC 34.8 03/03/2022 1424   RDW 11.9 03/03/2022 1424   LYMPHSABS 1.9 03/03/2022 1424   MONOABS 0.5 03/03/2022 1424   EOSABS 0.1 03/03/2022 1424   BASOSABS 0.0 03/03/2022 1424   CMP     Component Value Date/Time   NA 131 (L) 03/03/2022 1424   K 4.0 03/03/2022 1424   CL  97 (L) 03/03/2022 1424   CO2 24 03/03/2022 1424   GLUCOSE 139 (H) 03/03/2022 1424   BUN 11 03/03/2022 1424   CREATININE 0.68 03/03/2022 1424   CREATININE 0.67 02/16/2018 1332   CALCIUM 10.0 03/03/2022 1424   PROT 7.0 03/03/2022 1424   ALBUMIN 4.6 03/03/2022 1424   AST 18 03/03/2022 1424   ALT 14 03/03/2022 1424   ALKPHOS 90 03/03/2022 1424   BILITOT 0.4 03/03/2022 1424   GFRNONAA >60 03/03/2022 1424   GFRAA >90 08/21/2011 0545   COVID and Flu neg  CT CHEST: IMPRESSION: Few scattered patchy opacities in both lungs, somewhat nodular in appearance, could represent alveolar hemorrhage greatest in superior segment RIGHT lower lobe though infection not excluded.   Linear subsegmental atelectasis BILATERAL upper lobes.   RIGHT lobe liver hemangioma 3.3 x 2.2 cm.   Additional small hypervascular focus lateral segment LEFT lobe liver question small atypical hemangioma.   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   Aortic Atherosclerosis (ICD10-I70.0).  Assessment and Plan: * Hemoptysis Large volume of bright red hemoptysis (see photo above). CT changes ? Alveolar hemorrhage in RLL. Seems to be self limiting for the moment, bleeding has slowed down significantly Avoid any anticoagulants Hold home ASA PCCM to see in AM for possible bronchoscopy NPO  Right adrenal mass (HCC) F/U 1 year adrenal CT.  Hypothyroidism Cont synthroid when med rec completed.  Essential hypertension Med rec pending.  Seizure disorder (HCC) Cont home seizure meds.      Advance Care Planning:   Code Status: Full Code  Consults: PCCM  Family Communication: No family in room  Severity of Illness: The appropriate patient status for this patient is OBSERVATION. Observation status is judged to be reasonable and necessary in order to provide the required intensity of service to  ensure the patient's safety. The patient's presenting  symptoms, physical exam findings, and initial radiographic and laboratory data in the context of their medical condition is felt to place them at decreased risk for further clinical deterioration. Furthermore, it is anticipated that the patient will be medically stable for discharge from the hospital within 2 midnights of admission.   Author: Hillary Bow., DO 03/04/2022 3:31 AM  For on call review [email or web address].

## 2022-03-04 NOTE — Assessment & Plan Note (Signed)
Cont synthroid when med-rec completed. 

## 2022-03-04 NOTE — Assessment & Plan Note (Signed)
Large volume of bright red hemoptysis (see photo above). CT changes ? Alveolar hemorrhage in RLL. 1. Seems to be self limiting for the moment, bleeding has slowed down significantly 2. Avoid any anticoagulants 3. Hold home ASA 4. PCCM to see in AM for possible bronchoscopy 1. NPO

## 2022-03-04 NOTE — Progress Notes (Signed)
Patient seen and examined this morning, H&P reviewed and agree with assessment and plan.  61 year old female with chronic cough, prior seizures, hypertension, comes to the hospital with new onset hemoptysis.  Pulmonary consulted, work-up initiated for autoimmune/infectious causes, she will also get a bronchoscopy this afternoon.  Will closely monitor post-procedure   Jamie Patterson M. Elvera Lennox, MD, PhD [facility]  Between 7 am - 7 pm you can contact me via Amion (for emergencies) or Securechat (non urgent matters).  I am not available 7 pm - 7 am, please contact night coverage MD/APP via Amion

## 2022-03-04 NOTE — Progress Notes (Signed)
  Transition of Care (TOC) Screening Note   Patient Details  Name: Jamie Patterson Date of Birth: 21-Oct-1960   Transition of Care [facility]) CM/SW Contact:    Otelia Santee, LCSW Phone Number: 03/04/2022, 12:56 PM    Transition of Care Department [facility]) has reviewed patient and no TOC needs have been identified at this time. We will continue to monitor patient advancement through interdisciplinary progression rounds. If new patient transition needs arise, please place a TOC consult.

## 2022-03-04 NOTE — Consult Note (Addendum)
NAME:  Jamie Patterson, MRN:  [id number], DOB:  November 04, 1960, LOS: 0 ADMISSION DATE:  03/03/2022, CONSULTATION DATE:  03/04/22 REFERRING MD:  DR Alcario Drought, CHIEF COMPLAINT:  hemoptysis submassive  PCP Pleas Koch, NP [facility] - Dr Lenice Llamas   History of Present Illness:  61 year old female with a history of chronic cough for the last several years along with chronic sinus drainage intermittently green in color with discharge for the last several years.  Has seen Dr. Lenice Llamas at the pulmonary clinic for chronic cough in June 2022 at which time she was on gabapentin.  She was also on fish oil.  She was prescribed a PPI course that she still continues to take but she reports no improvement in cough.  She says she spends greater than 50% of the year with chronic cough.  This comes as episodes and can last for months.  It is unclear what provokes them.  During this time the voice also turns hoarse.  There is associated sinus drainage.  But no active acid reflux symptoms.  Her blood allergy profile was negative.  Currently she has been at her above baseline and then all of a sudden very abruptly without any warning March 03, 2022 she started having hemoptysis.  The amount of hemoptysis is new and is described as a "large" but she is soiled 7-10 kitchen table paper tissues.  Since arrival to the hospital it is largely improved.  This morning there is just a fleck of hemoptysis.  She denies any rash arthralgia arthritis.  No fever chills.  No nausea vomiting diarrhea.  No autoimmune symptoms.  Does not have tuberculosis.  She had a CT chest with contrast that I personally visualized.  She has some scattered pulmonary opacities particularly in the right lower lobe superior segment.  There is also some liver hemangioma and adrenal adenoma.  No mention of pulmonary embolism but the CT chest was with contrast though it was not a PE protocol.  No mention of endobronchial lung lesion.  Of note she is a  former smoker with a 20 pack smoking history having quit 26 years ago.    Past Medical History:    has a past medical history of Amblyopia, ARTHRITIS, TRAUMATIC, ANKLE (08/07/2009), Benign essential tremor, Chronic pain due to injury, Depression, GERD (gastroesophageal reflux disease), History of hypertension, HLD (hyperlipidemia), Imbalance, Influenza A (09/15/2018), Iron deficiency anemia, LLL pneumonia (10/05/2018), Right ankle pain (2003), Seizure ([place]), and Syncope and collapse (08/19/2011).   reports that she quit smoking about 26 years ago. Her smoking use included cigarettes. She has a 20.00 pack-year smoking history. She has never used smokeless tobacco.  Past Surgical History:  Procedure Laterality Date   ANKLE FUSION  02/2004   Right-50% disability    CARPAL TUNNEL RELEASE  12/2017   L CTR repair, R thumb A1 pulley release ([place])   CESAREAN SECTION     HAMMER TOE SURGERY  09/12/2019   hospitalization  08/2011   syncope thought 2/2 seizure, MRI - Hyperintensity in the cerebral white matter bilaterally is nonspecific, started on Keppra   ORIF ANKLE FRACTURE  06/2003   Right    Allergies  Allergen Reactions   Amlodipine Swelling    Ankle edema    Immunization History  Administered Date(s) Administered   Influenza Split 08/19/2012   Influenza,inj,Quad PF,6+ Mos 05/04/2015, 09/04/2018, 04/07/2019, 08/14/2020   PFIZER(Purple Top)SARS-COV-2 Vaccination 10/31/2019, 11/21/2019   Pneumococcal Conjugate-13 05/04/2015   Tdap 08/28/2011   Zoster Recombinat (  Shingrix) 04/13/2020    Family History  Problem Relation Age of Onset   Depression Mother    Arrhythmia Mother 56       Dx with VTach after having sudden collapse, did NOT have h/o AMI's    Atrial fibrillation Father    Hypertension Father    Osteoarthritis Father    Aortic aneurysm Father    Hepatitis Brother        Hep. C   Anemia Sister      Current Facility-Administered Medications:    acetaminophen  (TYLENOL) tablet 650 mg, 650 mg, Oral, Q6H PRN **OR** acetaminophen (TYLENOL) suppository 650 mg, 650 mg, Rectal, Q6H PRN, Alcario Drought, Jared M, DO   albuterol (PROVENTIL) (2.5 MG/3ML) 0.083% nebulizer solution 2.5 mg, 2.5 mg, Nebulization, Q6H PRN, Alcario Drought, Jared M, DO   cholecalciferol (VITAMIN D3) tablet 1,000 Units, 1,000 Units, Oral, Daily, Alcario Drought, Jared M, DO, 1,000 Units at 03/04/22 8828   cycloSPORINE (RESTASIS) 0.05 % ophthalmic emulsion 1 drop, 1 drop, Both Eyes, BID, Alcario Drought, Jared M, DO, 1 drop at 03/04/22 0809   DULoxetine (CYMBALTA) DR capsule 60 mg, 60 mg, Oral, BID, Alcario Drought, Jared M, DO, 60 mg at 03/04/22 0810   escitalopram (LEXAPRO) tablet 10 mg, 10 mg, Oral, Daily, Alcario Drought, Jared M, DO, 10 mg at 03/04/22 0034   ferrous sulfate tablet 325 mg, 325 mg, Oral, Q breakfast, Alcario Drought, Jared M, DO, 325 mg at 03/04/22 0810   gabapentin (NEURONTIN) capsule 300 mg, 300 mg, Oral, TID WC, 300 mg at 03/04/22 9179 **AND** gabapentin (NEURONTIN) capsule 1,200 mg, 1,200 mg, Oral, QHS, Alcario Drought, Jared M, DO   lamoTRIgine (LAMICTAL) tablet 150 mg, 150 mg, Oral, BID, Alcario Drought, Jared M, DO, 150 mg at 03/04/22 1505   levothyroxine (SYNTHROID) tablet 50 mcg, 50 mcg, Oral, Q0600, Etta Quill, DO, 50 mcg at 03/04/22 6979   melatonin tablet 10 mg, 10 mg, Oral, QHS, Alcario Drought, Jared M, DO   montelukast (SINGULAIR) tablet 10 mg, 10 mg, Oral, QHS, Alcario Drought, Jared M, DO   multivitamin with minerals tablet 1 tablet, 1 tablet, Oral, Daily, Alcario Drought, Jared M, DO, 1 tablet at 03/04/22 0809   ondansetron (ZOFRAN) tablet 4 mg, 4 mg, Oral, Q6H PRN **OR** ondansetron (ZOFRAN) injection 4 mg, 4 mg, Intravenous, Q6H PRN, Alcario Drought, Jared M, DO   pantoprazole (PROTONIX) EC tablet 40 mg, 40 mg, Oral, Daily, Alcario Drought, Jared M, DO, 40 mg at 03/04/22 4801   primidone (MYSOLINE) tablet 50 mg, 50 mg, Oral, BID, Alcario Drought, Jared M, DO, 50 mg at 03/04/22 0809   propranolol (INDERAL) tablet 40 mg, 40 mg, Oral, Daily, Alcario Drought, Jared M, DO,  40 mg at 03/04/22 0809   rosuvastatin (CRESTOR) tablet 10 mg, 10 mg, Oral, QPM, Alcario Drought, Jared M, DO   tiZANidine (ZANAFLEX) tablet 8 mg, 8 mg, Oral, QHS, Alcario Drought, Jared M, DO   vitamin B-12 (CYANOCOBALAMIN) tablet 1,000 mcg, 1,000 mcg, Oral, Daily, Jennette Kettle M, DO, 1,000 mcg at 03/04/22 0811     Significant Hospital Events:  03/03/2022 - admit   Interim History / Subjective:   03/04/2022 - seen in nrf [address]  Objective   Blood pressure 122/78, pulse 70, temperature 98 F (36.7 C), temperature source Oral, resp. rate 18, height 5' 6"  (1.676 m), weight 93 kg, last menstrual period 06/01/2013, SpO2 98 %.        Intake/Output Summary (Last 24 hours) at 03/04/2022 0930 Last data filed at 03/04/2022 0816 Gross per 24 hour  Intake 0 ml  Output --  Net 0 ml   Filed Weights   03/03/22 1418  Weight: 93 kg    Examination: General: Overweight looks well.  Resting comfortably HENT: Intermittent cough and hoarse voice that she talks but particularly hoarse voice Lungs: Clear to auscultation bilaterally.  No wheeze no crackles Cardiovascular: Normal heart sounds Abdomen: Soft nontender no organomegaly Extremities: No sinus no clubbing no edema Neuro: Alert and oriented x3.  Moves all 4 extremities GU: Not examined  Resolved Hospital Problem list   X  Assessment & Plan:  ASSESSMENT / PLAN:  PULMONARY  A:  Former 20 pack smoker quit 26 years prior to admission  Chronic cough with chronic sinus drainage -refractory and idiopathic [blood allergy test negative] x several years  Abrupt onset submassive hemoptysis 03/03/2022 with pulmonary infiltrates particularly right upper lobe superior segment but no endobronchial lesion.  Story not consistent with pulmonary embolism.  No infectious associated symptoms  03/04/2022 -> improved hemoptysis since admission.  Currently n.p.o.  P:   Check ANA, ESR, GBM, ANCA, double-stranded DNA  Check urine Legionella and urine strep,  procalcitonin, Quantifeferon gold  Get CT scan sinus  Do Sarah get evaluation for pulm embolism check D-dimer and Doppler ultrasound  Bronchoscopy with lavage indicated with or without endobronchial biopsy depending on endobronchial mass lesion on bronchoscopy exam   -We will send for respiratory virus panel, cytology, lavage for alveolar hemorrhage, Gram stain and cell count   -Continue n.p.o.   -Case requested for today but if not available can come back as outpatient for tomorrow 03/05/2022  Cough control important -start Hycodan cough syrup but stop fish oil   Addendum in eNDO suite 1:38 PM 03/04/2022   1 having recurring hemoptysis in the Endo suite -pre procedure: Convert procedure to GA with 8.5 size ET tube. ->also give nebulized lidocaine to suppress cough.   2) Start nebulized tranexamic acid (513m Q8h)  3) Risks of pneumothorax, hemothorax, sedation/anesthesia complications such as cardiac or respiratory arrest or hypotension, stroke and bleeding all explained. Benefits of diagnosis but limitations of non-diagnosis also explained. Patient verbalized understanding and wished to proceed. Also explained that patient might end up on ventilator I nICU. 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 MBrand Males M.D., F.C.C.P,  Pulmonary and Critical Care Medicine Staff Physician, CTaholahDirector - Interstitial Lung Disease  Program  [place] [place] [address] NPI Number:  NPI ##[id number] Pager: [phone] If no answer  ->  Check AMION or Try [phone] Telephone (clinical office): [phone] Telephone (research): [phone]  9:30 AM 03/04/2022   03/04/2022 9:30 AM    LABS    PULMONARY No results for input(s): "PHART", "PCO2ART", "PO2ART", "HCO3", "TCO2", "O2SAT" in the last 168 hours.  Invalid input(s): "PCO2", "PO2"  CBC Recent Labs  Lab 03/03/22 1424 03/04/22 0458  HGB 13.5 12.6  HCT 38.8 37.3  WBC 6.2 7.0  PLT 284 221    COAGULATION Recent Labs  Lab 03/03/22 1424  INR 0.9    CARDIAC  No results for input(s): "TROPONINI"  in the last 168 hours. No results for input(s): "PROBNP" in the last 168 hours.  CHEMISTRY Recent Labs  Lab 03/03/22 1424 03/04/22 0458  NA 131* 135  K 4.0 3.5  CL 97* 100  CO2 24 27  GLUCOSE 139* 117*  BUN 11 10  CREATININE 0.68 0.72  CALCIUM 10.0 9.5   Estimated Creatinine Clearance: 85.9 mL/min (by C-G formula based on SCr of 0.72 mg/dL).   LIVER Recent Labs  Lab 03/03/22 1424  AST 18  ALT 14  ALKPHOS 90  BILITOT 0.4  PROT 7.0  ALBUMIN 4.6  INR 0.9     INFECTIOUS No results for input(s): "LATICACIDVEN", "PROCALCITON" in the last 168 hours.   ENDOCRINE CBG (last 3)  No results for input(s): "GLUCAP" in the last 72 hours.       IMAGING x48h  - image(s) personally visualized  -   highlighted in bold CT Chest W Contrast  Result Date: 03/03/2022 CLINICAL DATA:  Hemoptysis, new onset EXAM: CT CHEST WITH CONTRAST TECHNIQUE: Multidetector CT imaging of the chest was performed during intravenous contrast administration. RADIATION DOSE REDUCTION: This exam was performed according to the departmental dose-optimization program which includes automated exposure control, adjustment of the mA and/or kV according to patient size and/or use of iterative reconstruction technique. CONTRAST:  74m OMNIPAQUE IOHEXOL 300 MG/ML  SOLN IV COMPARISON:  None Available. FINDINGS: Cardiovascular: Minimal atherosclerotic calcifications aorta without  aneurysm or dissection. Minimal enlargement of cardiac chambers. No pericardial effusion. Pulmonary arteries grossly patent on non targeted exam. Mediastinum/Nodes: Esophagus unremarkable. No thoracic adenopathy. Base of cervical region normal appearance. Lungs/Pleura: Few scattered patchy opacities in both lungs, somewhat nodular in appearance, could represent alveolar hemorrhage greatest in superior segment RIGHT lower lobe no additional infiltrate, pleural effusion, or pneumothorax. No dominant pulmonary mass. Linear subsegmental atelectasis BILATERAL upper lobes. Upper Abdomen: Mass identified lateral RIGHT lobe liver 3.3 x 2.2 cm, demonstrating nodular peripheral enhancement consistent with hemangioma. Additional small hypervascular focus lateral segment LEFT lobe liver question small atypical hemangioma 8 mm diameter. 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 Musculoskeletal: Mild degenerative disc disease changes at thoracolumbar junction. No acute osseous findings. IMPRESSION: Few scattered patchy opacities in both lungs, somewhat nodular in appearance, could represent alveolar hemorrhage greatest in superior segment RIGHT lower lobe though infection not excluded. Linear subsegmental atelectasis BILATERAL upper lobes. RIGHT lobe liver hemangioma 3.3 x 2.2 cm. Additional small hypervascular focus lateral segment LEFT lobe liver question small atypical hemangioma. 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 Aortic Atherosclerosis (ICD10-I70.0). Electronically Signed   By: MLavonia DanaM.D.   On: 03/03/2022 17:32   DG Chest 2 View  Result Date: 03/03/2022 CLINICAL DATA:  coughing up blood EXAM: CHEST - 2 VIEW COMPARISON:  Radiograph 09/23/2021 FINDINGS: Unchanged cardiomediastinal silhouette. Persistent right midlung streaky opacities. Left lung is clear. No new airspace disease. Mild bronchial wall thickening and  lucencies which may reflect bronchiectasis or emphysema. No pleural effusion. No pneumothorax. No acute osseous abnormality. Thoracic spondylosis. IMPRESSION: Persistent streaky opacities in the right lung with mild bronchial wall thickening, findings could reflect an atypical infectious/inflammatory process. Consider chest CT as clinically indicated. Electronically Signed   By: JMaurine SimmeringM.D.   On: 03/03/2022 15:08

## 2022-03-04 NOTE — Plan of Care (Signed)
°  Problem: Coping: °Goal: Level of anxiety will decrease °Outcome: Progressing °  °

## 2022-03-04 NOTE — Transfer of Care (Signed)
Immediate Anesthesia Transfer of Care Note  Patient: Jamie Patterson  Procedure(s) Performed: VIDEO BRONCHOSCOPY WITHOUT FLUORO (Bilateral) BRONCHIAL WASHINGS  Patient Location: PACU  Anesthesia Type:General  Level of Consciousness: awake  Airway & Oxygen Therapy: Patient Spontanous Breathing and Patient connected to face mask oxygen  Post-op Assessment: Report given to RN and Post -op Vital signs reviewed and stable  Post vital signs: Reviewed and stable  Last Vitals:  Vitals Value Taken Time  BP 120/82 03/04/22 1432  Temp 36.8 C 03/04/22 1426  Pulse 83 03/04/22 1434  Resp 12 03/04/22 1434  SpO2 93 % 03/04/22 1434  Vitals shown include unvalidated device data.  Last Pain:  Vitals:   03/04/22 1426  TempSrc: Temporal  PainSc: 0-No pain         Complications: No notable events documented.

## 2022-03-04 NOTE — Anesthesia Preprocedure Evaluation (Addendum)
Anesthesia Evaluation  Patient identified by MRN, date of birth, ID band Patient awake    Reviewed: Allergy & Precautions, NPO status , Patient's Chart, lab work & pertinent test results  Airway Mallampati: II  TM Distance: >3 FB Neck ROM: Full    Dental no notable dental hx. (+) Caps, Dental Advisory Given   Pulmonary shortness of breath and with exertion, pneumonia, unresolved, former smoker,  Hemoptysis Pulmonary infiltrate Chronic cough Hoarseness     + decreased breath sounds      Cardiovascular hypertension, Pt. on medications Normal cardiovascular exam Rhythm:Regular Rate:Normal     Neuro/Psych  Headaches, Seizures -, Well Controlled,  PSYCHIATRIC DISORDERS Depression  Neuromuscular disease    GI/Hepatic Neg liver ROS, GERD  Medicated,  Endo/Other  Hypothyroidism Obesity   Renal/GU negative Renal ROS  negative genitourinary   Musculoskeletal negative musculoskeletal ROS (+)   Abdominal (+) + obese,   Peds  Hematology  (+) Blood dyscrasia, anemia ,   Anesthesia Other Findings   Reproductive/Obstetrics                            Anesthesia Physical Anesthesia Plan  ASA: 3  Anesthesia Plan: General   Post-op Pain Management: Minimal or no pain anticipated   Induction: Intravenous  PONV Risk Score and Plan: 2 and Treatment may vary due to age or medical condition and Ondansetron  Airway Management Planned: Oral ETT  Additional Equipment: None  Intra-op Plan:   Post-operative Plan:   Informed Consent: I have reviewed the patients History and Physical, chart, labs and discussed the procedure including the risks, benefits and alternatives for the proposed anesthesia with the patient or authorized representative who has indicated his/her understanding and acceptance.     Dental advisory given  Plan Discussed with: CRNA and Anesthesiologist  Anesthesia Plan Comments:        Anesthesia Quick Evaluation

## 2022-03-04 NOTE — Assessment & Plan Note (Signed)
F/U 1 year adrenal CT.

## 2022-03-04 NOTE — Assessment & Plan Note (Signed)
Cont home seizure meds.

## 2022-03-04 NOTE — Progress Notes (Signed)
Bilateral lower extremity venous duplex has been completed. Preliminary results can be found in CV Proc through chart review.   03/04/22 11:16 AM Olen Cordial RVT

## 2022-03-04 NOTE — Anesthesia Procedure Notes (Signed)
Procedure Name: Intubation Date/Time: 03/04/2022 1:50 PM  Performed by: Ezekiel Ina, CRNAPre-anesthesia Checklist: Patient identified, Emergency Drugs available, Suction available and Patient being monitored Patient Re-evaluated:Patient Re-evaluated prior to induction Oxygen Delivery Method: Circle system utilized Preoxygenation: Pre-oxygenation with 100% oxygen Induction Type: IV induction Ventilation: Mask ventilation without difficulty Laryngoscope Size: Miller and 2 Grade View: Grade I Tube type: Oral Tube size: 8.5 mm Number of attempts: 1 Airway Equipment and Method: Stylet Placement Confirmation: ETT inserted through vocal cords under direct vision, positive ETCO2 and breath sounds checked- equal and bilateral Secured at: 21 cm Tube secured with: Tape Dental Injury: Teeth and Oropharynx as per pre-operative assessment

## 2022-03-04 NOTE — Anesthesia Postprocedure Evaluation (Signed)
Anesthesia Post Note  Patient: Jamie Patterson  Procedure(s) Performed: VIDEO BRONCHOSCOPY WITHOUT FLUORO (Bilateral) BRONCHIAL WASHINGS     Patient location during evaluation: PACU Anesthesia Type: General Level of consciousness: awake and alert and oriented Pain management: pain level controlled Vital Signs Assessment: post-procedure vital signs reviewed and stable Respiratory status: spontaneous breathing, nonlabored ventilation and respiratory function stable Cardiovascular status: blood pressure returned to baseline and stable Postop Assessment: no apparent nausea or vomiting Anesthetic complications: no   No notable events documented.  Last Vitals:  Vitals:   03/04/22 1450 03/04/22 1500  BP: 102/62 117/72  Pulse: 84 77  Resp: 15 13  Temp:    SpO2: 98% 96%    Last Pain:  Vitals:   03/04/22 1500  TempSrc:   PainSc: 0-No pain                 Simaya Lumadue A.

## 2022-03-04 NOTE — Op Note (Signed)
Name:  Jamie Patterson MRN:  [id number] DOB:  [birth year]  PROCEDURE NOTE  Procedure(s): Flexible bronchoscopy [phone]) Bronchial alveolar lavage [phone]) of the Right lower lobe superior segment  Indications:  hemoptysis with chronic cough  Consent:  Procedure, benefits, risks and alternatives discussed.  Questions answered.  Consent obtained.  Anesthesia:  General anesthesia  Location: [facility]  Procedure summary:  Appropriate equipment was assembled.  The patient was brought to the procedure suite room and identified as [name] with 10-24-1960  Safety timeout was performed. The patient was placed supine on the  table, airway and GA adminsted. Secured with 8.5 size ET tube. Prior to all this paitent ws given lidocaine neb and her cough stopped  After the appropriate level of  GA s assured, flexible video bronchoscope was lubricated and inserted through the ET tube   Airway examination was YES performed bilaterally to subsegmental level.  Thhe was blood coating the entire airway trachea, carinak, secondary carina throughout both lungs but deep in the subsegments there was no blood in any of the sub-divisions. No mass seen. Mucosa appeared normal and NO endobronchial lesions were identified.  Bronchial alveolar lavage of the RLL was performed with 30 mL of COLD normal saline  x 3 number of times  and each time the bloody returns looked less blody   After hemostasis was assured, the bronchoscope was withdrawn.  The patient was recovered and then  transferred to recovery area  Post-procedure chest x-ray was NOT ordered.  Specimens sent: Bronchial alveolar lavage specimen of the RLL superiog segment for cell count   Complications:  No immediate complications were noted.  Hemodynamic parameters and oxygenation remained stable throughout the procedure.  Estimated blood loss:  <2 cc that was natural  IMPRESSION 1. Normal airway 2. Blood seen along airways 3. No  endobronchial lesion 4. RLL superior segment BAL - not c/w alveolar hemorrhage 5. No biopsy 6 Send cultures and cell count and cytology  Followup Inpatient Start tanexamic acid Might need nebulized lidocaine for cough control  Dr. Brand Males, M.D., [facility].C.P Pulmonary and Critical Care Medicine Staff Physician [facility] Pager: [phone], If no answer or between  15:00h - 7:00h: call [phone]  03/04/2022 2:17 PM

## 2022-03-05 ENCOUNTER — Other Ambulatory Visit (HOSPITAL_COMMUNITY): Payer: Self-pay

## 2022-03-05 ENCOUNTER — Telehealth: Payer: Self-pay | Admitting: Internal Medicine

## 2022-03-05 ENCOUNTER — Encounter (HOSPITAL_COMMUNITY): Payer: Self-pay | Admitting: Internal Medicine

## 2022-03-05 DIAGNOSIS — I10 Essential (primary) hypertension: Secondary | ICD-10-CM | POA: Diagnosis not present

## 2022-03-05 DIAGNOSIS — E038 Other specified hypothyroidism: Secondary | ICD-10-CM | POA: Diagnosis not present

## 2022-03-05 DIAGNOSIS — R042 Hemoptysis: Secondary | ICD-10-CM | POA: Diagnosis not present

## 2022-03-05 DIAGNOSIS — G40909 Epilepsy, unspecified, not intractable, without status epilepticus: Secondary | ICD-10-CM | POA: Diagnosis not present

## 2022-03-05 DIAGNOSIS — E278 Other specified disorders of adrenal gland: Secondary | ICD-10-CM | POA: Diagnosis not present

## 2022-03-05 LAB — COMPREHENSIVE METABOLIC PANEL
ALT: 15 U/L (ref 0–44)
AST: 18 U/L (ref 15–41)
Albumin: 3.8 g/dL (ref 3.5–5.0)
Alkaline Phosphatase: 75 U/L (ref 38–126)
Anion gap: 10 (ref 5–15)
BUN: 11 mg/dL (ref 6–20)
CO2: 27 mmol/L (ref 22–32)
Calcium: 9.2 mg/dL (ref 8.9–10.3)
Chloride: 100 mmol/L (ref 98–111)
Creatinine, Ser: 0.61 mg/dL (ref 0.44–1.00)
GFR, Estimated: >60 mL/min (ref >60)
Glucose, Bld: 120 mg/dL — ABNORMAL HIGH (ref 70–99)
Potassium: 3.2 mmol/L — ABNORMAL LOW (ref 3.5–5.1)
Sodium: 137 mmol/L (ref 135–145)
Total Bilirubin: 0.5 mg/dL (ref 0.3–1.2)
Total Protein: 6.8 g/dL (ref 6.5–8.1)

## 2022-03-05 LAB — CBC WITH DIFFERENTIAL/PLATELET
Abs Immature Granulocytes: 0.02 10*3/uL (ref 0.00–0.07)
Basophils Absolute: 0 10*3/uL (ref 0.0–0.1)
Basophils Relative: 0 %
Eosinophils Absolute: 0 10*3/uL (ref 0.0–0.5)
Eosinophils Relative: 0 %
HCT: 37.6 % (ref 36.0–46.0)
Hemoglobin: 12.8 g/dL (ref 12.0–15.0)
Immature Granulocytes: 0 %
Lymphocytes Relative: 22 %
Lymphs Abs: 1.8 10*3/uL (ref 0.7–4.0)
MCH: 33.3 pg (ref 26.0–34.0)
MCHC: 34 g/dL (ref 30.0–36.0)
MCV: 97.9 fL (ref 80.0–100.0)
Monocytes Absolute: 0.7 10*3/uL (ref 0.1–1.0)
Monocytes Relative: 9 %
Neutro Abs: 5.7 10*3/uL (ref 1.7–7.7)
Neutrophils Relative %: 69 %
Platelets: 256 10*3/uL (ref 150–400)
RBC: 3.84 MIL/uL — ABNORMAL LOW (ref 3.87–5.11)
RDW: 12.1 % (ref 11.5–15.5)
WBC: 8.3 10*3/uL (ref 4.0–10.5)
nRBC: 0 % (ref 0.0–0.2)

## 2022-03-05 LAB — MAGNESIUM: Magnesium: 2 mg/dL (ref 1.7–2.4)

## 2022-03-05 LAB — PNEUMOCYSTIS JIROVECI SMEAR BY DFA: Pneumocystis jiroveci Ag: NEGATIVE

## 2022-03-05 LAB — PHOSPHORUS: Phosphorus: 3.4 mg/dL (ref 2.5–4.6)

## 2022-03-05 MED ORDER — AZITHROMYCIN 250 MG PO TABS: 500.0000 mg | ORAL_TABLET | Freq: Every day | ORAL | Status: DC

## 2022-03-05 MED ORDER — ACETAMINOPHEN 325 MG PO TABS
650.0000 mg | ORAL_TABLET | Freq: Four times a day (QID) | ORAL | 0 refills | Status: DC | PRN
  Filled 2022-03-05: qty 20, 3d supply, fill #0

## 2022-03-05 MED ORDER — DOXYCYCLINE HYCLATE 100 MG PO TABS
100.0000 mg | ORAL_TABLET | Freq: Two times a day (BID) | ORAL | 0 refills | Status: AC
  Filled 2022-03-05: qty 14, 7d supply, fill #0

## 2022-03-05 MED ORDER — ONDANSETRON HCL 4 MG PO TABS
4.0000 mg | ORAL_TABLET | Freq: Four times a day (QID) | ORAL | 0 refills | Status: DC | PRN
  Filled 2022-03-05: qty 20, 30d supply, fill #0

## 2022-03-05 MED ORDER — PREDNISONE 20 MG PO TABS
40.0000 mg | ORAL_TABLET | Freq: Every day | ORAL | Status: DC
Start: 2022-03-06 — End: 2022-03-05

## 2022-03-05 MED ORDER — TRANEXAMIC ACID FOR INHALATION
500.0000 mg | Freq: Three times a day (TID) | RESPIRATORY_TRACT | 0 refills | Status: AC
  Filled 2022-03-05: qty 90, 3d supply, fill #0

## 2022-03-05 MED ORDER — AZITHROMYCIN 250 MG PO TABS: 250.0000 mg | ORAL_TABLET | Freq: Every day | ORAL | Status: DC

## 2022-03-05 MED ORDER — HYDROCODONE BIT-HOMATROP MBR 5-1.5 MG/5ML PO SOLN
5.0000 mL | Freq: Two times a day (BID) | ORAL | 0 refills | Status: DC
  Filled 2022-03-05: qty 120, 12d supply, fill #0

## 2022-03-05 MED ORDER — POTASSIUM CHLORIDE CRYS ER 20 MEQ PO TBCR
40.0000 meq | EXTENDED_RELEASE_TABLET | Freq: Two times a day (BID) | ORAL | Status: DC
  Administered 2022-03-05: 40 meq via ORAL
  Filled 2022-03-05: qty 2

## 2022-03-05 MED ORDER — PREDNISONE 10 MG PO TABS
ORAL_TABLET | ORAL | 0 refills | Status: AC
  Filled 2022-03-05: qty 20, 8d supply, fill #0

## 2022-03-05 MED ORDER — DOXYCYCLINE HYCLATE 100 MG PO TABS
100.0000 mg | ORAL_TABLET | Freq: Two times a day (BID) | ORAL | Status: DC
Start: 2022-03-05 — End: 2022-03-05
  Administered 2022-03-05: 100 mg via ORAL
  Filled 2022-03-05: qty 1

## 2022-03-05 NOTE — TOC Transition Note (Signed)
Transition of Care [facility]) - CM/SW Discharge Note   Patient Details  Name: Jamie Patterson MRN: [id number] Date of Birth: September 16, 1960  Transition of Care [facility]) CM/SW Contact:  Otelia Santee, LCSW Phone Number: 03/05/2022, 2:30 PM   Clinical Narrative:    Pt is to discharge to home. Pt has new nebulizer requirement. Nebulizer has been ordered through [employer] and will be delivered to pt's room prior to discharge.    Final next level of care: Home/Self Care Barriers to Discharge: No Barriers Identified   Patient Goals and CMS Choice Patient states their goals for this hospitalization and ongoing recovery are:: To return home   Choice offered to / list presented to : Patient  Discharge Placement                       Discharge Plan and Services                DME Arranged: Nebulizer machine DME Agency: [employer] Date DME Agency Contacted: 03/05/22 Time DME Agency Contacted: 1430 Representative spoke with at DME Agency: Duwayne Heck            Social Determinants of Health (SDOH) Interventions     Readmission Risk Interventions     No data to display

## 2022-03-05 NOTE — Discharge Summary (Incomplete)
Physician Discharge Summary   Patient: Jamie Patterson MRN: [id number] DOB: 1961-04-28  Admit date:     03/03/2022  Discharge date: 03/05/22  Discharge Physician: Raiford Noble, DO   PCP: Pleas Koch, NP   Recommendations at discharge:   Follow-up with PCP within 1 to 2 weeks and repeat CBC, CMP, Mag, Phos within 1 week Follow-up with pulmonary Dr. Shearon Stalls or an app in the pulm clinic within a week repeat chest x-ray in 3 to 6 weeks Follow-up with BAL results for cultures and cell count and cytology as well as awaiting the ANA, ESR, GBM, ANCA, double-stranded DNA as well as urine Legionella and urine strep QuantiFERON gold extremities She will need repeat chest CT scan without contrast in 8 to 12 weeks Follow-up outpatient for right adrenal mass within a year with right adrenal washout CT  Discharge Diagnoses: Principal Problem:   Hemoptysis Active Problems:   Seizure disorder [name])   Essential hypertension   Hypothyroidism   Right adrenal mass ([name])  Resolved Problems:   * No resolved hospital problems. *  Hospital Course: Patient is a 61 year old Caucasian female with a past medical history significant for but not limited to chronic cough and a seizure many years ago, hypertension as well as other comorbidities who presented to the hospital and ED at [place] [facility] [place] with sudden new onset hemoptysis.  She has had a chronic cough for a while and was started on meds for GERD by lateral pulmonary.  She continues to cough but had a normal cough and is started having some frank hemoptysis and no mucus or blood.  Her amount of bleeding improved significantly since onset and she had no upper respiratory symptoms.  She presented for further evaluation for this hemoptysis and pulmonary was consulted and she underwent further testing and had CT of the chest without contrast and there is no mention of PE noted and no mention of endobronchial lesion.  Pulmonary recommended a fiberoptic  bronchoscopy and this was done which showed a normal airway but there is blood staining along the airways with no endobronchial lesion and right lower lobe superior segment BAL was not consistent with alveolar hemorrhage and no biopsy was done.  The analysis was sent for cell count and cytology and she was started on tanexamic acid.  Pulmonary recommended starting her on doxycycline as well as a prednisone taper and recommended home tanexamic Acid nebs for 3 days.  She was stable for discharge and hemoptysis had improved and pulmonary cleared for discharge.  She will need to follow-up with PCP and follow-up within pulm within a week close follow-up.  Assessment and Plan: * Hemoptysis Large volume of bright red hemoptysis (see photo above). CT changes ? Alveolar hemorrhage in RLL. Seems to be self limiting for the moment, bleeding has slowed down significantly and then had an episode of hemoptysis prior to her bronchoscopy Avoid any anticoagulants Hold home ASA and NSAIDs for now and defer to PCP to resume PCCM evaluated and took her for a fiberoptic bronchoscopy which showed a normal airway with blood seen along the airways with no endobronchial lesion on the right lower lobe superior segment BAL was done was not consistent with alveolar hemorrhage.  No biopsy was done but the analysis of the BAL was sent for culture and cell count and cytology which will need follow-up in outpatient setting Pulmonary recommending starting the patient on a prednisone taper, antibiotics with doxycycline as well as continuing  tanexamic Acid nebs  for 3 days as well as cough suppression with Hycodan.  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  Hypothyroidism Continue home levothyroxine  Essential hypertension Med rec pending.  Seizure disorder ([name]) Cont home seizure meds.  Hypokalemia -Patient's potassium was 3.2 -Replete prior to discharge; mag level was 2.0 -Continue monitor and trend and repeat CMP within 1 week  Obesity -Complicates overall prognosis and care -Estimated body mass index is 33.09 kg/m as calculated from the following:   Height as of this encounter: _0  (1.676 m).   Weight as of this encounter: 93 kg.  -Weight Loss and Dietary Counseling given  Consultants: Pulmonary Procedures performed: Bronchoscopy with BAL Disposition: Home Diet recommendation:  Discharge Diet Orders (From admission, onward)     Start     Ordered   03/05/22 0000  Diet - low sodium heart healthy        03/05/22 1216           Cardiac diet DISCHARGE MEDICATION: Allergies as of 03/05/2022       Reactions   Amlodipine Swelling   Ankle edema        Medication List     STOP taking these medications    aspirin EC 81 MG tablet   hydrochlorothiazide 25 MG tablet Commonly known as: HYDRODIURIL   naproxen sodium 220 MG tablet Commonly known as: ALEVE       TAKE these medications    acetaminophen 325 MG tablet Commonly known as: TYLENOL Take 2 tablets (650 mg total) by mouth every 6 (six) hours as needed for mild pain (or Fever >/= 101). What changed:  medication strength how much to take when to take this reasons to take this   albuterol 108 (90 Base) MCG/ACT inhaler Commonly known as: VENTOLIN HFA Inhale 2 puffs into the lungs every 6 (six) hours as needed for wheezing or shortness of breath.   cholecalciferol 25 MCG (1000 UNIT) tablet Commonly known as: VITAMIN D3 Take 1,000 Units by mouth  daily.   cyanocobalamin 1000 MCG tablet Commonly known as: VITAMIN B12 Take 1,000 mcg by mouth daily.   cycloSPORINE 0.05 % ophthalmic emulsion Commonly known as: RESTASIS Place 1 drop into both eyes 2 (two) times daily.   doxycycline 100 MG tablet Commonly known as: VIBRA-TABS Take 1 tablet (100 mg total) by mouth every 12 (twelve) hours for 7 days.   DULoxetine 60 MG capsule Commonly known as: CYMBALTA Take 60 mg by mouth 2 (two) times daily.   escitalopram 10 MG tablet Commonly known as: LEXAPRO Take 10 mg by mouth daily.   ferrous sulfate 325 (65 FE) MG tablet Take 325 mg by mouth daily with breakfast.   FISH OIL PO Take 1 tablet by mouth daily.   gabapentin 300 MG capsule Commonly known as: NEURONTIN Take 300-1,200 mg by mouth See admin instructions. Take 1 capsule four times a day, then take 4 capsules at bedtime   HYDROcodone bit-homatropine 5-1.5 MG/5ML syrup Commonly known as: HYCODAN Take 5 mLs by mouth 2 (two) times daily.   lamoTRIgine 150 MG tablet  Commonly known as: LAMICTAL TAKE 1 TABLET BY MOUTH TWICE A DAY   levothyroxine 50 MCG tablet Commonly known as: SYNTHROID Take 1 tablet by mouth every morning on an empty stomach with water only.  No food or other medications for 30 minutes. What changed:  how much to take how to take this when to take this   MAGNESIUM PO Take 400 mg by mouth 3 (three) times daily.   Melatonin 10 MG Tabs Take 2 tablets by mouth at bedtime.   montelukast 10 MG tablet Commonly known as: SINGULAIR TAKE 1 TABLET BY MOUTH AT BEDTIME. FOR COUGH AND ALLERGIES What changed: See the new instructions.   multivitamin tablet Take 1 tablet by mouth daily.   olmesartan 20 MG tablet Commonly known as: BENICAR TAKE 1 TABLET BY MOUTH ONCE A DAY FOR BLOOD PRESSURE   ondansetron 4 MG tablet Commonly known as: ZOFRAN Take 1 tablet (4 mg total) by mouth every 6 (six) hours as needed for nausea.   pantoprazole 40 MG  tablet Commonly known as: Protonix Take 1 tablet (40 mg total) by mouth daily.   predniSONE 10 MG tablet Commonly known as: DELTASONE Take 4 tablets by mouth daily with breakfast for 2 days, then 3 tabs daily for 2 days, then 2 tabs daily for 2 days, then 1 tab daily for 2 days. Start taking on: March 06, 2022   primidone 50 MG tablet Commonly known as: MYSOLINE Take 1 tablet (50 mg total) by mouth in the morning and at bedtime.   propranolol 40 MG tablet Commonly known as: INDERAL Take 1 tablet (40 mg total) by mouth daily.   rosuvastatin 10 MG tablet Commonly known as: CRESTOR TAKE 1 TABLET BY MOUTH ONCE EVERY EVENING FOR CHOLESTEROL What changed: See the new instructions.   tiZANidine 4 MG tablet Commonly known as: ZANAFLEX Take 8 mg by mouth at bedtime.   tranexamic acid 1000 MG/10ML injection Commonly known as: CYKLOKAPRON Inhale 5 mLs (500 mg total) by nebulization every 8 (eight) hours for 3 days.   vitamin C 1000 MG tablet Take 1,000 mg by mouth daily.               Durable Medical Equipment  (From admission, onward)           Start     Ordered   03/05/22 0000  For home use only DME Nebulizer machine       Question Answer Comment  Patient needs a nebulizer to treat with the following condition Hemoptysis   Length of Need 6 Months      03/05/22 1210           Discharge Exam: Filed Weights   03/03/22 1418 03/04/22 1244  Weight: 93 kg 93 kg   Vitals:   03/05/22 0557 03/05/22 1521  BP: 129/77 107/77  Pulse: 62 67  Resp: 20 18  Temp: (!) 97.5 F (36.4 C) 98.4 F (36.9 C)  SpO2: 92% 95%   Examination: Physical Exam:  Constitutional: WN/WD obese Caucasian female currently no acute distress appears a little anxious Respiratory: Diminished to auscultation bilaterally with coarse breath sounds, no wheezing, rales, rhonchi or crackles. Normal respiratory effort and patient is not tachypenic. No accessory muscle use.  Unlabored breathing and  not wearing supplemental oxygen via nasal cannula Cardiovascular: RRR, no murmurs / rubs / gallops. S1 and S2 auscultated. No extremity edema.  Abdomen: Soft, non-tender, distended secondary body habitus. Bowel sounds positive.  GU: Deferred. Musculoskeletal: No clubbing /  cyanosis of digits/nails. No joint deformity upper and lower extremities.  Skin: No rashes, lesions, ulcers on limited skin evaluation. No induration; Warm and dry.  Neurologic: CN 2-12 grossly intact with no focal deficits. Romberg sign and cerebellar reflexes not assessed.  Psychiatric: Normal judgment and insight. Alert and oriented x 3.  Slightly anxious mood and appropriate affect.   Condition at discharge: stable  The results of significant diagnostics from this hospitalization (including imaging, microbiology, ancillary and laboratory) are listed below for reference.   Imaging Studies: VAS [place] LOWER EXTREMITY VENOUS (DVT)  Result Date: 03/04/2022  Lower Venous DVT Study Patient Name:  JAMES LAFALCE Zoll  Date of Exam:   03/04/2022 Medical Rec #: [id number]   Accession #:    [id number] Date of Birth: 16-May-1961    Patient Gender: F Patient Age:   76 years Exam Location:  [facility] Procedure:      VAS [place] LOWER EXTREMITY VENOUS (DVT) Referring Phys: MURALI RAMASWAMY --------------------------------------------------------------------------------  Indications: Edema.  Risk Factors: None identified. Limitations: Poor ultrasound/tissue interface. Comparison Study: No prior studies. Performing Technologist: Oliver Hum RVT  Examination Guidelines: A complete evaluation includes B-mode imaging, spectral Doppler, color Doppler, and power Doppler as needed of all accessible portions of each vessel. Bilateral testing is considered an integral part of a complete examination. Limited examinations for reoccurring indications may be performed as noted. The reflux portion of the exam is performed with the patient in reverse Trendelenburg.   +---------+---------------+---------+-----------+----------+--------------+ RIGHT    CompressibilityPhasicitySpontaneityPropertiesThrombus Aging +---------+---------------+---------+-----------+----------+--------------+ CFV      Full           Yes      Yes                                 +---------+---------------+---------+-----------+----------+--------------+ SFJ      Full                                                        +---------+---------------+---------+-----------+----------+--------------+ FV Prox  Full                                                        +---------+---------------+---------+-----------+----------+--------------+ FV Mid   Full                                                        +---------+---------------+---------+-----------+----------+--------------+ FV DistalFull                                                        +---------+---------------+---------+-----------+----------+--------------+ PFV      Full                                                        +---------+---------------+---------+-----------+----------+--------------+  POP      Full           Yes      Yes                                 +---------+---------------+---------+-----------+----------+--------------+ PTV      Full                                                        +---------+---------------+---------+-----------+----------+--------------+ PERO     Full                                                        +---------+---------------+---------+-----------+----------+--------------+   +---------+---------------+---------+-----------+----------+--------------+ LEFT     CompressibilityPhasicitySpontaneityPropertiesThrombus Aging +---------+---------------+---------+-----------+----------+--------------+ CFV      Full           Yes      Yes                                  +---------+---------------+---------+-----------+----------+--------------+ SFJ      Full                                                        +---------+---------------+---------+-----------+----------+--------------+ FV Prox  Full                                                        +---------+---------------+---------+-----------+----------+--------------+ FV Mid   Full                                                        +---------+---------------+---------+-----------+----------+--------------+ FV DistalFull                                                        +---------+---------------+---------+-----------+----------+--------------+ PFV      Full                                                        +---------+---------------+---------+-----------+----------+--------------+ POP      Full           Yes      Yes                                 +---------+---------------+---------+-----------+----------+--------------+  PTV      Full                                                        +---------+---------------+---------+-----------+----------+--------------+ PERO     Full                                                        +---------+---------------+---------+-----------+----------+--------------+     Summary: RIGHT: - There is no evidence of deep vein thrombosis in the lower extremity.  - No cystic structure found in the popliteal fossa.  LEFT: - There is no evidence of deep vein thrombosis in the lower extremity.  - No cystic structure found in the popliteal fossa.  *See table(s) above for measurements and observations. Electronically signed by Deitra Mayo MD on 03/04/2022 at 2:24:27 PM.    Final    CT MAXILLOFACIAL WO CONTRAST  Result Date: 03/04/2022 CLINICAL DATA:  Chronic sinusitis. EXAM: CT MAXILLOFACIAL WITHOUT CONTRAST TECHNIQUE: Multidetector CT imaging of the maxillofacial structures was performed. Multiplanar CT image  reconstructions were also generated. RADIATION DOSE REDUCTION: This exam was performed according to the departmental dose-optimization program which includes automated exposure control, adjustment of the mA and/or kV according to patient size and/or use of iterative reconstruction technique. COMPARISON:  None Available. FINDINGS: Osseous: No acute bony findings. Orbits: The globes are intact. No retrobulbar/intraorbital mass or inflammation. The bony orbits are intact. Sinuses: Severe diffuse and chronic appearing sinusitis. Complete opacification of the frontal, and right maxillary sinus. Air-fluid level noted in the left maxillary sinus. Small amount of residual air in the left half of the sphenoid sinus. The sinus walls are all slightly thickened suggesting chronic sinusitis. There also surgical changes suggesting prior sinonasal surgery. The mastoid air cells and middle ear cavities are clear. Ethmoid sinuses. Soft tissues: No significant soft tissue abnormality. Limited intracranial: No significant intracranial findings. Moderate age advanced cerebral atrophy. IMPRESSION: 1. Severe diffuse and chronic appearing pansinusitis 2. Postsurgical changes suggesting prior sinonasal surgery. 3. Moderate age advanced cerebral atrophy. Electronically Signed   By: Marijo Sanes M.D.   On: 03/04/2022 11:13   CT Chest W Contrast  Result Date: 03/03/2022 CLINICAL DATA:  Hemoptysis, new onset EXAM: CT CHEST WITH CONTRAST TECHNIQUE: Multidetector CT imaging of the chest was performed during intravenous contrast administration. RADIATION DOSE REDUCTION: This exam was performed according to the departmental dose-optimization program which includes automated exposure control, adjustment of the mA and/or kV according to patient size and/or use of iterative reconstruction technique. CONTRAST:  50m OMNIPAQUE IOHEXOL 300 MG/ML  SOLN IV COMPARISON:  None Available. FINDINGS: Cardiovascular: Minimal atherosclerotic calcifications  aorta without aneurysm or dissection. Minimal enlargement of cardiac chambers. No pericardial effusion. Pulmonary arteries grossly patent on non targeted exam. Mediastinum/Nodes: Esophagus unremarkable. No thoracic adenopathy. Base of cervical region normal appearance. Lungs/Pleura: Few scattered patchy opacities in both lungs, somewhat nodular in appearance, could represent alveolar hemorrhage greatest in superior segment RIGHT lower lobe no additional infiltrate, pleural effusion, or pneumothorax. No dominant pulmonary mass. Linear subsegmental atelectasis BILATERAL upper lobes. Upper Abdomen: Mass identified lateral RIGHT lobe liver 3.3 x 2.2 cm, demonstrating nodular peripheral enhancement consistent with hemangioma.  Additional small hypervascular focus lateral segment LEFT lobe liver question small atypical hemangioma 8 mm diameter. 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 Musculoskeletal: Mild degenerative disc disease changes at thoracolumbar junction. No acute osseous findings. IMPRESSION: Few scattered patchy opacities in both lungs, somewhat nodular in appearance, could represent alveolar hemorrhage greatest in superior segment RIGHT lower lobe though infection not excluded. Linear subsegmental atelectasis BILATERAL upper lobes. RIGHT lobe liver hemangioma 3.3 x 2.2 cm. Additional small hypervascular focus lateral segment LEFT lobe liver question small atypical hemangioma. 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 Aortic Atherosclerosis (ICD10-I70.0). Electronically Signed   By: Lavonia Dana M.D.   On: 03/03/2022 17:32   DG Chest 2 View  Result Date: 03/03/2022 CLINICAL DATA:  coughing up blood EXAM: CHEST - 2 VIEW COMPARISON:  Radiograph 09/23/2021 FINDINGS: Unchanged cardiomediastinal silhouette. Persistent right midlung streaky opacities. Left lung is clear. No new airspace disease. Mild bronchial wall  thickening and lucencies which may reflect bronchiectasis or emphysema. No pleural effusion. No pneumothorax. No acute osseous abnormality. Thoracic spondylosis. IMPRESSION: Persistent streaky opacities in the right lung with mild bronchial wall thickening, findings could reflect an atypical infectious/inflammatory process. Consider chest CT as clinically indicated. Electronically Signed   By: Maurine Simmering M.D.   On: 03/03/2022 15:08    Microbiology: Results for orders placed or performed during the hospital encounter of 03/03/22  Resp Panel by RT-PCR (Flu A&B, Covid) Anterior Nasal Swab     Status: None   Collection Time: 03/03/22  4:46 PM   Specimen: Anterior Nasal Swab  Result Value Ref Range Status   SARS Coronavirus 2 by RT PCR NEGATIVE NEGATIVE Final    Comment: (NOTE) SARS-CoV-2 target nucleic acids are NOT DETECTED.  The SARS-CoV-2 RNA is generally detectable in upper respiratory specimens during the acute phase of infection. The lowest concentration of SARS-CoV-2 viral copies this assay can detect is 138 copies/mL. A negative result does not preclude SARS-Cov-2 infection and should not be used as the sole basis for treatment or other patient management decisions. A negative result may occur with  improper specimen collection/handling, submission of specimen other than nasopharyngeal swab, presence of viral mutation(s) within the areas targeted by this assay, and inadequate number of viral copies(<138 copies/mL). A negative result must be combined with clinical observations, patient history, and epidemiological information. The expected result is Negative.  Fact Sheet for Patients:  EntrepreneurPulse.com.au  Fact Sheet for Healthcare Providers:  IncredibleEmployment.be  This test is no t yet approved or cleared by the [place] FDA and  has been authorized for detection and/or diagnosis of SARS-CoV-2 by FDA under an Emergency Use  Authorization (EUA). This EUA will remain  in effect (meaning this test can be used) for the duration of the COVID-19 declaration under Section 564(b)(1) of the Act, 21 U.S.C.section 360bbb-3(b)(1), unless the authorization is terminated  or revoked sooner.       Influenza A by PCR NEGATIVE NEGATIVE Final   Influenza B by PCR NEGATIVE NEGATIVE Final    Comment: (NOTE) The Xpert Xpress SARS-CoV-2/FLU/RSV plus assay is intended as an aid in the diagnosis of influenza from Nasopharyngeal swab specimens and should not be used as a sole basis for treatment. Nasal washings and aspirates are unacceptable for Xpert Xpress SARS-CoV-2/FLU/RSV testing.  Fact Sheet for Patients: EntrepreneurPulse.com.au  Fact Sheet for Healthcare Providers: IncredibleEmployment.be  This test is not yet approved or  cleared by the [place] and has been authorized for detection and/or diagnosis of SARS-CoV-2 by FDA under an Emergency Use Authorization (EUA). This EUA will remain in effect (meaning this test can be used) for the duration of the COVID-19 declaration under Section 564(b)(1) of the Act, 21 U.S.C. section 360bbb-3(b)(1), unless the authorization is terminated or revoked.  Performed at [facility], [address], [place], [place] [address]   Pneumocystis smear by DFA     Status: None   Collection Time: 03/04/22  2:08 PM   Specimen: Bronchial Alveolar Lavage; Respiratory  Result Value Ref Range Status   Specimen Source-PJSRC BRONCHIAL ALVEOLAR LAVAGE  Final   Pneumocystis jiroveci Ag NEGATIVE  Final    Comment: Performed at [facility] Performed at [facility], [place] [address] [place], [place] [address]   Culture, Respiratory w Gram Stain     Status: None (Preliminary result)   Collection Time: 03/04/22  2:08 PM   Specimen: Bronchial Alveolar Lavage; Respiratory  Result Value Ref Range Status    Specimen Description   Final    BRONCHIAL ALVEOLAR LAVAGE Performed at [facility] [address], [place], [place] [address]    Special Requests   Final    NONE RLL Performed at [facility] [address], [place], [place] [address]    Gram Stain   Final    FEW WBC PRESENT, PREDOMINANTLY MONONUCLEAR NO ORGANISMS SEEN    Culture   Final    FEW STAPHYLOCOCCUS AUREUS CULTURE REINCUBATED FOR BETTER GROWTH Performed at [facility], [place] [address] [place], [place] [address]    Report Status PENDING  Incomplete  Respiratory (~20 pathogens) panel by PCR     Status: None   Collection Time: 03/04/22  4:00 PM   Specimen: Bronchoalveolar Lavage; Respiratory  Result Value Ref Range Status   Adenovirus NOT DETECTED NOT DETECTED Final   Coronavirus 229E NOT DETECTED NOT DETECTED Final    Comment: (NOTE) The Coronavirus on the Respiratory Panel, DOES NOT test for the novel  Coronavirus (2019 nCoV)    Coronavirus HKU1 NOT DETECTED NOT DETECTED Final   Coronavirus NL63 NOT DETECTED NOT DETECTED Final   Coronavirus OC43 NOT DETECTED NOT DETECTED Final   Metapneumovirus NOT DETECTED NOT DETECTED Final   Rhinovirus / Enterovirus NOT DETECTED NOT DETECTED Final   Influenza A NOT DETECTED NOT DETECTED Final   Influenza B NOT DETECTED NOT DETECTED Final   Parainfluenza Virus 1 NOT DETECTED NOT DETECTED Final   Parainfluenza Virus 2 NOT DETECTED NOT DETECTED Final   Parainfluenza Virus 3 NOT DETECTED NOT DETECTED Final   Parainfluenza Virus 4 NOT DETECTED NOT DETECTED Final   Respiratory Syncytial Virus NOT DETECTED NOT DETECTED Final   Bordetella pertussis NOT DETECTED NOT DETECTED Final   Bordetella Parapertussis NOT DETECTED NOT DETECTED Final   Chlamydophila pneumoniae NOT DETECTED NOT DETECTED Final   Mycoplasma pneumoniae NOT DETECTED NOT DETECTED Final    Comment: Performed at [facility], [place]. [address], [place],  [address]     Labs: CBC: Recent Labs  Lab 03/03/22 1424 03/04/22 0458 03/05/22 0952  WBC 6.2 7.0 8.3  NEUTROABS 3.7  --  5.7  HGB 13.5 12.6 12.8  HCT 38.8 37.3 37.6  MCV 94.4 97.1 97.9  PLT 284 221 248   Basic Metabolic Panel: Recent Labs  Lab 03/03/22 1424 03/04/22 0458 03/05/22 0952  NA 131* 135 137  K  4.0 3.5 3.2*  CL 97* 100 100  CO2 _0 GLUCOSE 139* 117* 120*  BUN _1 CREATININE 0.68 0.72 0.61  CALCIUM 10.0 9.5 9.2  MG  --   --  2.0  PHOS  --   --  3.4   Liver Function Tests: Recent Labs  Lab 03/03/22 1424 03/05/22 0952  AST 18 18  ALT 14 15  ALKPHOS 90 75  BILITOT 0.4 0.5  PROT 7.0 6.8  ALBUMIN 4.6 3.8   CBG: No results for input(s): "GLUCAP" in the last 168 hours.  Discharge time spent: greater than 30 minutes.  Signed: Raiford Noble, DO [facility] 03/05/2022

## 2022-03-05 NOTE — Plan of Care (Signed)

## 2022-03-05 NOTE — Progress Notes (Signed)
Order to discharge pt home.  Discharge instructions/AVS given to patient and reviewed - education provided as needed.  Pt advised to call PCP and/or come back to the hospital if there are any problems. Pt verbalized understanding.     Pt still waiting for nebulizer and outpatient medications.  Will discharge pt once delivered.

## 2022-03-05 NOTE — Telephone Encounter (Signed)
Pt scheduled to see ND on 8/3 and 9/20. This will be printed on her discharge papers.

## 2022-03-05 NOTE — Consult Note (Addendum)
NAME:  Jamie Patterson, MRN:  [id number], DOB:  [birth year], LOS: 0 ADMISSION DATE:  03/03/2022, CONSULTATION DATE:  03/04/22 REFERRING MD:  DR Julian Reil, CHIEF COMPLAINT:  hemoptysis submassive  PCP Doreene Nest, NP [facility] - Dr Durel Salts   BRIEF  61 year old female with a history of chronic cough for the last several years along with chronic sinus drainage intermittently green in color with discharge for the last several years.  Has seen Dr. Durel Salts at the pulmonary clinic for chronic cough in June 2022 at which time she was on gabapentin.  She was also on fish oil.  She was prescribed a PPI course that she still continues to take but she reports no improvement in cough.  She says she spends greater than 50% of the year with chronic cough.  This comes as episodes and can last for months.  It is unclear what provokes them.  During this time the voice also turns hoarse.  There is associated sinus drainage.  But no active acid reflux symptoms.  Her blood allergy profile was negative.  Currently she has been at her above baseline and then all of a sudden very abruptly without any warning March 03, 2022 she started having hemoptysis.  The amount of hemoptysis is new and is described as a "large" but she is soiled 7-10 kitchen table paper tissues.  Since arrival to the hospital it is largely improved.  This morning there is just a fleck of hemoptysis.  She denies any rash arthralgia arthritis.  No fever chills.  No nausea vomiting diarrhea.  No autoimmune symptoms.  Does not have tuberculosis.  She had a CT chest with contrast that I personally visualized.  She has some scattered pulmonary opacities particularly in the right lower lobe superior segment.  There is also some liver hemangioma and adrenal adenoma.  No mention of pulmonary embolism but the CT chest was with contrast though it was not a PE protocol.  No mention of endobronchial lung lesion.  Of note she is a former smoker with a 20  pack smoking history having quit 26 years ago.    Past Medical History:   has a past medical history of Amblyopia, ARTHRITIS, TRAUMATIC, ANKLE (08/07/2009), Benign essential tremor, Chronic pain due to injury, Depression, GERD (gastroesophageal reflux disease), History of hypertension, HLD (hyperlipidemia), Imbalance, Influenza A (09/15/2018), Iron deficiency anemia, LLL pneumonia (10/05/2018), Right ankle pain (2003), Seizure (HCC), and Syncope and collapse (08/19/2011).    has a past surgical history that includes Ankle Fusion (02/2004); ORIF ankle fracture (06/2003); Cesarean section; hospitalization (08/2011); Carpal tunnel release (12/2017); Hammer toe surgery (09/12/2019); Video bronchoscopy (Bilateral, 03/04/2022); and Bronchial washings (03/04/2022).    Significant Hospital Events:  03/03/2022 - admit 03/04/2022: Bronchoscopy with right lower lobe superior segment lavage.  Features not consistent with alveolar hemorrhage.  Diffuse spattering of blood across airways with no focal source.  -Cell count 86% neutrophils 6% lymphocytes and 0% eosinophils.  PCP negative.  -Respiratory virus panel negative.   Interim History / Subjective:   03/05/2022 - no further hemopatysis. BAL with lot of polys. PCP neg. Cultures pending. Feels better. CT sinus with chronic sinusitis  Objective   Blood pressure 129/77, pulse 62, temperature (!) 97.5 F (36.4 C), temperature source Oral, resp. rate 20, height 5\' 6"  (1.676 m), weight 93 kg, last menstrual period 06/01/2013, SpO2 92 %.        Intake/Output Summary (Last 24 hours) at 03/05/2022 1151 Last data filed at 03/05/2022  6578 Gross per 24 hour  Intake 720 ml  Output --  Net 720 ml   Filed Weights   03/03/22 1418 03/04/22 1244  Weight: 93 kg 93 kg    Examination: General Appearance:  Looks well Head:  Normocephalic, without obvious abnormality, atraumatic Eyes:  PERRL - yes, conjunctiva/corneas - c lear     Ears:  Normal external ear  canals, both ears Nose:  G tube - no Throat:  ETT TUBE - no , OG tube - no Neck:  Supple,  No enlargement/tenderness/nodules Lungs: Clear to auscultation bilaterally,  Heart:  S1 and S2 normal, no murmur, CVP - no.  Pressors - no Abdomen:  Soft, no masses, no organomegaly Genitalia / Rectal:  Not done Extremities:  Extremities- intact Skin:  ntact in exposed areas . Sacral area - not examined Neurologic:  Sedation - none -> RASS - +! Marland Kitchen Moves all 4s - yes. CAM-ICU - neg . Orientation - x3+     Resolved Hospital Problem list   X  Assessment & Plan:  ASSESSMENT / PLAN:  PULMONARY  A:  Former 20 pack smoker quit 26 years prior to admission  Chronic cough with chronic sinus drainage -refractory and idiopathic [blood allergy test negative] x several years  Abrupt onset submassive hemoptysis 03/03/2022 with pulmonary infiltrates particularly right upper lobe superior segment but no endobronchial lesion.  Story not consistent with pulmonary embolism.  No infectious associated symptoms   03/04/2022: CT scan with chronic sinusitis.  Bronchoscopy without alveolar hemorrhage "significant neutrophils.  Culture pending.  Started tranexamic acid nebulizer.  Doppler negative for  DVT    03/05/2022 -> no further hemoptysis after cough suppression and tranexamic acid  Plan  - 7-day doxycycline course for chronic sinusitis and hemoptysis with a lot of neutrophils  -Start prednisone 40 mg once daily -> taper over 7 days and stop -Pharmacy checking if she can do tranexamic acid 500 mg 3 times daily nebulizer x36 hours after discharge for hemoptysis  - if we cannot do this then is fine -Cough suppression with Hycodan 5 mils twice daily as needed [further course to be decided in the outpatient setting]  - AWait  ANA, ESR, GBM, ANCA, double-stranded DNA - aWait  urine Legionella and urine strep, , Quantifeferon gold -Will need outpatient ENT consult [to be addressed at the time of pulmonary  follow-up in the outpatient] -Await bronchoscopy with lavage culture results -We will need CT scan of the chest without contrast in 8-12 weeks   Outpatient follow-up in less than 1 week and another follow-up in 8-12 weeks -request sent to pulmonary office   Pulmonary will sign off  . Anti-infectives (From admission, onward)    Start     Dose/Rate Route Frequency Ordered Stop   03/06/22 1000  azithromycin (ZITHROMAX) tablet 250 mg  Status:  Discontinued       See Hyperspace for full Linked Orders Report.   250 mg Oral Daily 03/05/22 1149 03/05/22 1150   03/05/22 1245  azithromycin (ZITHROMAX) tablet 500 mg  Status:  Discontinued       See Hyperspace for full Linked Orders Report.   500 mg Oral Daily 03/05/22 1149 03/05/22 1150   03/05/22 1245  doxycycline (VIBRA-TABS) tablet 100 mg        100 mg Oral Every 12 hours 03/05/22 1150 03/12/22 0959         Best practice (daily eval):  Diet: npo Pain/Anxiety/Delirium protocol (if indicated): nx VAP protocol (if indicated): x DVT  prophylaxis: scd GI prophylaxis: baseline ppi Glucose control: per triad Mobility:  per triad Code Status: full Family Communication: Wife and husband updated Disposition: med floor    \  SIGNATURE    Dr. Kalman Shan, M.D., F.C.C.P,  Pulmonary and Critical Care Medicine Staff Physician, [facility] Center Director - Interstitial Lung Disease  Program  Pulmonary Fibrosis [facility] [place], [place], [address]  NPI Number:  NPI #[id number]  Pager: [phone], If no answer  -> Check AMION or Try [phone] Telephone (clinical office): [phone] Telephone (research): [phone]  11:51 AM 03/05/2022   03/05/2022 11:51 AM    LABS    PULMONARY No results for input(s): "PHART", "PCO2ART", "PO2ART", "HCO3", "TCO2", "O2SAT" in the last 168 hours.  Invalid input(s): "PCO2", "PO2"  CBC Recent Labs  Lab 03/03/22 1424  03/04/22 0458 03/05/22 0952  HGB 13.5 12.6 12.8  HCT 38.8 37.3 37.6  WBC 6.2 7.0 8.3  PLT 284 221 256    COAGULATION Recent Labs  Lab 03/03/22 1424  INR 0.9    CARDIAC  No results for input(s): "TROPONINI" in the last 168 hours. No results for input(s): "PROBNP" in the last 168 hours.  CHEMISTRY Recent Labs  Lab 03/03/22 1424 03/04/22 0458 03/05/22 0952  NA 131* 135 137  K 4.0 3.5 3.2*  CL 97* 100 100  CO2 24 27 27   GLUCOSE 139* 117* 120*  BUN 11 10 11   CREATININE 0.68 0.72 0.61  CALCIUM 10.0 9.5 9.2  MG  --   --  2.0  PHOS  --   --  3.4   Estimated Creatinine Clearance: 85.9 mL/min (by C-G formula based on SCr of 0.61 mg/dL).   LIVER Recent Labs  Lab 03/03/22 1424 03/05/22 0952  AST 18 18  ALT 14 15  ALKPHOS 90 75  BILITOT 0.4 0.5  PROT 7.0 6.8  ALBUMIN 4.6 3.8  INR 0.9  --      INFECTIOUS Recent Labs  Lab 03/04/22 1526  PROCALCITON <0.10     ENDOCRINE CBG (last 3)  No results for input(s): "GLUCAP" in the last 72 hours.       IMAGING x48h  - image(s) personally visualized  -   highlighted in bold VAS [place] LOWER EXTREMITY VENOUS (DVT)  Result Date: 03/04/2022  Lower Venous DVT Study Patient Name:  TURA PAULHUS Margolis  Date of Exam:   03/04/2022 Medical Rec #: [id number]   Accession #:    [id number] Date of Birth: 08/24/1960    Patient Gender: F Patient Age:   45 years Exam Location:  [facility] Procedure:      VAS [place] LOWER EXTREMITY VENOUS (DVT) Referring Phys: Ventura Hollenbeck --------------------------------------------------------------------------------  Indications: Edema.  Risk Factors: None identified. Limitations: Poor ultrasound/tissue interface. Comparison Study: No prior studies. Performing Technologist: Chanda Busing RVT  Examination Guidelines: A complete evaluation includes B-mode imaging, spectral Doppler, color Doppler, and power Doppler as needed of all accessible portions of each vessel. Bilateral testing is considered an  integral part of a complete examination. Limited examinations for reoccurring indications may be performed as noted. The reflux portion of the exam is performed with the patient in reverse Trendelenburg.  +---------+---------------+---------+-----------+----------+--------------+ RIGHT    CompressibilityPhasicitySpontaneityPropertiesThrombus Aging +---------+---------------+---------+-----------+----------+--------------+ CFV      Full           Yes      Yes                                 +---------+---------------+---------+-----------+----------+--------------+  SFJ      Full                                                        +---------+---------------+---------+-----------+----------+--------------+ FV Prox  Full                                                        +---------+---------------+---------+-----------+----------+--------------+ FV Mid   Full                                                        +---------+---------------+---------+-----------+----------+--------------+ FV DistalFull                                                        +---------+---------------+---------+-----------+----------+--------------+ PFV      Full                                                        +---------+---------------+---------+-----------+----------+--------------+ POP      Full           Yes      Yes                                 +---------+---------------+---------+-----------+----------+--------------+ PTV      Full                                                        +---------+---------------+---------+-----------+----------+--------------+ PERO     Full                                                        +---------+---------------+---------+-----------+----------+--------------+   +---------+---------------+---------+-----------+----------+--------------+ LEFT     CompressibilityPhasicitySpontaneityPropertiesThrombus  Aging +---------+---------------+---------+-----------+----------+--------------+ CFV      Full           Yes      Yes                                 +---------+---------------+---------+-----------+----------+--------------+ SFJ      Full                                                        +---------+---------------+---------+-----------+----------+--------------+  FV Prox  Full                                                        +---------+---------------+---------+-----------+----------+--------------+ FV Mid   Full                                                        +---------+---------------+---------+-----------+----------+--------------+ FV DistalFull                                                        +---------+---------------+---------+-----------+----------+--------------+ PFV      Full                                                        +---------+---------------+---------+-----------+----------+--------------+ POP      Full           Yes      Yes                                 +---------+---------------+---------+-----------+----------+--------------+ PTV      Full                                                        +---------+---------------+---------+-----------+----------+--------------+ PERO     Full                                                        +---------+---------------+---------+-----------+----------+--------------+     Summary: RIGHT: - There is no evidence of deep vein thrombosis in the lower extremity.  - No cystic structure found in the popliteal fossa.  LEFT: - There is no evidence of deep vein thrombosis in the lower extremity.  - No cystic structure found in the popliteal fossa.  *See table(s) above for measurements and observations. Electronically signed by Waverly Ferrari MD on 03/04/2022 at 2:24:27 PM.    Final    CT MAXILLOFACIAL WO CONTRAST  Result Date: 03/04/2022 CLINICAL DATA:   Chronic sinusitis. EXAM: CT MAXILLOFACIAL WITHOUT CONTRAST TECHNIQUE: Multidetector CT imaging of the maxillofacial structures was performed. Multiplanar CT image reconstructions were also generated. RADIATION DOSE REDUCTION: This exam was performed according to the departmental dose-optimization program which includes automated exposure control, adjustment of the mA and/or kV according to patient size and/or use of iterative reconstruction technique. COMPARISON:  None Available. FINDINGS: Osseous: No acute bony findings. Orbits: The globes are intact. No retrobulbar/intraorbital mass or inflammation. The bony orbits are intact. Sinuses:  Severe diffuse and chronic appearing sinusitis. Complete opacification of the frontal, and right maxillary sinus. Air-fluid level noted in the left maxillary sinus. Small amount of residual air in the left half of the sphenoid sinus. The sinus walls are all slightly thickened suggesting chronic sinusitis. There also surgical changes suggesting prior sinonasal surgery. The mastoid air cells and middle ear cavities are clear. Ethmoid sinuses. Soft tissues: No significant soft tissue abnormality. Limited intracranial: No significant intracranial findings. Moderate age advanced cerebral atrophy. IMPRESSION: 1. Severe diffuse and chronic appearing pansinusitis 2. Postsurgical changes suggesting prior sinonasal surgery. 3. Moderate age advanced cerebral atrophy. Electronically Signed   By: Rudie Meyer M.D.   On: 03/04/2022 11:13   CT Chest W Contrast  Result Date: 03/03/2022 CLINICAL DATA:  Hemoptysis, new onset EXAM: CT CHEST WITH CONTRAST TECHNIQUE: Multidetector CT imaging of the chest was performed during intravenous contrast administration. RADIATION DOSE REDUCTION: This exam was performed according to the departmental dose-optimization program which includes automated exposure control, adjustment of the mA and/or kV according to patient size and/or use of iterative  reconstruction technique. CONTRAST:  75mL OMNIPAQUE IOHEXOL 300 MG/ML  SOLN IV COMPARISON:  None Available. FINDINGS: Cardiovascular: Minimal atherosclerotic calcifications aorta without aneurysm or dissection. Minimal enlargement of cardiac chambers. No pericardial effusion. Pulmonary arteries grossly patent on non targeted exam. Mediastinum/Nodes: Esophagus unremarkable. No thoracic adenopathy. Base of cervical region normal appearance. Lungs/Pleura: Few scattered patchy opacities in both lungs, somewhat nodular in appearance, could represent alveolar hemorrhage greatest in superior segment RIGHT lower lobe no additional infiltrate, pleural effusion, or pneumothorax. No dominant pulmonary mass. Linear subsegmental atelectasis BILATERAL upper lobes. Upper Abdomen: Mass identified lateral RIGHT lobe liver 3.3 x 2.2 cm, demonstrating nodular peripheral enhancement consistent with hemangioma. Additional small hypervascular focus lateral segment LEFT lobe liver question small atypical hemangioma 8 mm diameter. 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 Musculoskeletal: Mild degenerative disc disease changes at thoracolumbar junction. No acute osseous findings. IMPRESSION: Few scattered patchy opacities in both lungs, somewhat nodular in appearance, could represent alveolar hemorrhage greatest in superior segment RIGHT lower lobe though infection not excluded. Linear subsegmental atelectasis BILATERAL upper lobes. RIGHT lobe liver hemangioma 3.3 x 2.2 cm. Additional small hypervascular focus lateral segment LEFT lobe liver question small atypical hemangioma. 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 Aortic Atherosclerosis (ICD10-I70.0). Electronically Signed   By: Ulyses Southward M.D.   On: 03/03/2022 17:32   DG Chest 2 View  Result Date: 03/03/2022 CLINICAL DATA:  coughing up blood EXAM: CHEST - 2 VIEW COMPARISON:  Radiograph  09/23/2021 FINDINGS: Unchanged cardiomediastinal silhouette. Persistent right midlung streaky opacities. Left lung is clear. No new airspace disease. Mild bronchial wall thickening and lucencies which may reflect bronchiectasis or emphysema. No pleural effusion. No pneumothorax. No acute osseous abnormality. Thoracic spondylosis. IMPRESSION: Persistent streaky opacities in the right lung with mild bronchial wall thickening, findings could reflect an atypical infectious/inflammatory process. Consider chest CT as clinically indicated. Electronically Signed   By: Caprice Renshaw M.D.   On: 03/03/2022 15:08

## 2022-03-05 NOTE — Telephone Encounter (Signed)
Front desk  Jamie Patterson needs 2 appt   1) with App or DR Celine Mans in < 1week - but next week post hospital followup  2) DR Celine Mans in 8-12 weeks

## 2022-03-05 NOTE — Progress Notes (Signed)
Order to discharge pt home - nebulizer ordered and neb medication to be delivered by outpatient pharmacy.  Will continue to monitor pt and will follow up as needed.

## 2022-03-06 ENCOUNTER — Other Ambulatory Visit (HOSPITAL_COMMUNITY): Payer: Self-pay

## 2022-03-06 LAB — GLOMERULAR BASEMENT MEMBRANE ANTIBODIES: GBM Ab: <0.2 units (ref 0.0–0.9)

## 2022-03-06 LAB — ANTI-DNA ANTIBODY, DOUBLE-STRANDED: ds DNA Ab: <1 IU/mL (ref 0–9)

## 2022-03-06 LAB — ANCA TITERS
Atypical P-ANCA titer: <1:20 {titer}
C-ANCA: <1:20 {titer}
P-ANCA: <1:20 {titer}

## 2022-03-06 LAB — ANA: Anti Nuclear Antibody (ANA): NEGATIVE

## 2022-03-06 LAB — FUNGUS STAIN

## 2022-03-06 LAB — FUNGAL STAIN REFLEX

## 2022-03-07 LAB — QUANTIFERON-TB GOLD PLUS: QuantiFERON-TB Gold Plus: NEGATIVE

## 2022-03-07 LAB — QUANTIFERON-TB GOLD PLUS (RQFGPL)
QuantiFERON Mitogen Value: >10 IU/mL
QuantiFERON Nil Value: 0.02 IU/mL
QuantiFERON TB1 Ag Value: 0.1 IU/mL
QuantiFERON TB2 Ag Value: 0.08 IU/mL

## 2022-03-07 LAB — CULTURE, RESPIRATORY W GRAM STAIN

## 2022-03-07 LAB — CYTOLOGY - NON PAP

## 2022-03-09 LAB — ANAEROBIC CULTURE W GRAM STAIN

## 2022-03-13 ENCOUNTER — Ambulatory Visit: Payer: Medicare PPO | Admitting: Internal Medicine

## 2022-03-13 ENCOUNTER — Encounter: Payer: Self-pay | Admitting: Internal Medicine

## 2022-03-13 VITALS — BP 130/100 | HR 73 | Temp 98.2°F | Ht 66.0 in | Wt 218.8 lb

## 2022-03-13 DIAGNOSIS — J410 Simple chronic bronchitis: Secondary | ICD-10-CM | POA: Diagnosis not present

## 2022-03-13 DIAGNOSIS — J324 Chronic pansinusitis: Secondary | ICD-10-CM

## 2022-03-13 DIAGNOSIS — R053 Chronic cough: Secondary | ICD-10-CM | POA: Diagnosis not present

## 2022-03-13 DIAGNOSIS — R042 Hemoptysis: Secondary | ICD-10-CM | POA: Diagnosis not present

## 2022-03-13 NOTE — Progress Notes (Signed)
Jamie Patterson    [id number]    03/05/1961  Primary Care Physician:Clark, Keane Scrape, NP Date of Appointment: 03/13/2022 Established Patient Visit  Chief complaint:   Chief Complaint  Patient presents with   Follow-up    Follow-up: Hospital visit     HPI: Jamie Patterson is a 61 y.o. woman with chronic cough.  Interval Updates: Here for one year follow. She was hospitalized for hemoptysis and had a ct scan that showed possible bronchitis/pneumonia. She had bronchoscopy which was negative for [facility] - extensive in hospital records reviewed - cultures were negative. AFB initially negative but still pending. Cell count shows neutrophil predominance. No DVT/PE.   No further hemoptysis since discharge.   Currently on neurontin approx 1200 mg a day for her mood and chronic pain.   I have reviewed the patient's family social and past medical history and updated as appropriate.   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   Trimalleolar fracture on right s/p fusion     Benign essential tremor    worsened by anxiety   Chronic pain due to injury    at R ankle due to ankle fracture   Depression    followed by psych   GERD (gastroesophageal reflux disease)    History of hypertension    HLD (hyperlipidemia)    Imbalance    likely multifactorial (s/p balance training at [facility] 08/2010) ?CMT   Influenza A 09/15/2018   Iron deficiency anemia    LLL pneumonia 10/05/2018   Right ankle pain 2003   trimalleolar fx s/p fusion   Seizure (HCC)    focal, ex vacuo ventriculomegaly?, pending neuro w/u with sleep deprived EEG   Syncope and collapse 08/19/2011    Past Surgical History:  Procedure Laterality Date   ANKLE FUSION  02/2004   Right-50% disability    BRONCHIAL WASHINGS  03/04/2022   Procedure: BRONCHIAL WASHINGS;  Surgeon: Kalman Shan, MD;  Location: [facility];  Service: Endoscopy;;   CARPAL TUNNEL RELEASE  12/2017   L  CTR repair, R thumb A1 pulley release (Gramig)   CESAREAN SECTION     HAMMER TOE SURGERY  09/12/2019   hospitalization  08/2011   syncope thought 2/2 seizure, MRI - Hyperintensity in the cerebral white matter bilaterally is nonspecific, started on Keppra   ORIF ANKLE FRACTURE  06/2003   Right   VIDEO BRONCHOSCOPY Bilateral 03/04/2022   Procedure: VIDEO BRONCHOSCOPY WITHOUT FLUORO;  Surgeon: Kalman Shan, MD;  Location: [facility];  Service: Endoscopy;  Laterality: Bilateral;    Family History  Problem Relation Age of Onset   Depression Mother    Arrhythmia Mother 61       Dx with VTach after having sudden collapse, did NOT have h/o AMI's    Atrial fibrillation Father    Hypertension Father    Osteoarthritis Father    Aortic aneurysm Father    Hepatitis Brother        Hep. C   Anemia Sister     Social History   Occupational History   Occupation: Disabled    Comment: depression   Occupation: DISABILITY    Employer: UNEMPLOYED  Tobacco Use   Smoking status: Former    Packs/day: 1.00    Years: 20.00    Total pack years: 20.00    Types: Cigarettes    Quit date: 08/12/1995    Years since quitting: 26.6  Smokeless tobacco: Never  Vaping Use   Vaping Use: Never used  Substance and Sexual Activity   Alcohol use: No    Alcohol/week: 0.0 standard drinks of alcohol    Comment: occassinal   Drug use: No   Sexual activity: Not on file     Physical Exam: Blood pressure (!) 130/100, pulse 73, temperature 98.2 F (36.8 C), temperature source Oral, height 5\' 6"  (1.676 m), weight 218 lb 12.8 oz (99.2 kg), last menstrual period 06/01/2013, SpO2 97 %.  Gen:      No acute distress Lungs:  ctab no wheezes or crackles CV:        RRR no mrg   Data Reviewed: Imaging: CT Chest July 2023 reviewed - multifocal tree in bud opacities consistent with infection.  CT sinuses July 2023 - severe pansinusitis PFTs:      Latest Ref Rng & Units 01/14/2021    9:56 AM  PFT Results   FVC-Pre L 2.90   FVC-Predicted Pre % 78   FVC-Post L 2.72   FVC-Predicted Post % 73   Pre FEV1/FVC % % 72   Post FEV1/FCV % % 71   FEV1-Pre L 2.09   FEV1-Predicted Pre % 72   FEV1-Post L 1.94   DLCO uncorrected ml/min/mmHg 20.62   DLCO UNC% % 91   DLCO corrected ml/min/mmHg 20.62   DLCO COR %Predicted % 91   DLVA Predicted % 98   TLC L 5.80   TLC % Predicted % 105   RV % Predicted % 123    I have personally reviewed the patient's PFTs and they show normal pulmonary function.   Labs:  Immunization status: Immunization History  Administered Date(s) Administered   Influenza Split 08/19/2012   Influenza,inj,Quad PF,6+ Mos 05/04/2015, 09/04/2018, 04/07/2019, 08/14/2020   PFIZER(Purple Top)SARS-COV-2 Vaccination 10/31/2019, 11/21/2019   Pneumococcal Conjugate-13 05/04/2015   Tdap 08/28/2011   Zoster Recombinat (Shingrix) 04/13/2020    Assessment:  Chronic cough Severe pansinusitis Recent hemoptysis - likely secondary to bronchitis/early pneumonia based on clinical presentation.     Plan/Recommendations: She would like to stop pantoprazole. Says it doesn't really help her cough.   I am referring you to an ENT doctor for your sinus disease - I think this might be to blame for your chronic cough.  You can keep taking neurontin as it also treats the neurogenic cough component.   Let me know if you cough up any more blood.   I think her chronic bronchitis symptoms may be worsenig over the last year despite normal pFTS in 2022. Monitor closely off inhaler therapy for now.   Return to Care: No follow-ups on file.   2023, MD Pulmonary and Critical Care Medicine [facility] Office:[phone]

## 2022-03-13 NOTE — Patient Instructions (Addendum)
Please schedule follow up scheduled with myself in 3 months.  If my schedule is not open yet, we will contact you with a reminder closer to that time. Please call [phone] if you haven't heard from [place] a month before.   Before your next visit I would like you to have:  I am referring you to an ENT doctor for your sinus disease - I think this might be to blame for your chronic cough.  You can stop taking pantoprazole. Monitor your nighttime cough symptoms. Might need to take it just at night if cough worsens.   You can keep taking neurontin as it also treats the neurogenic cough component.   Let me know if you cough up any more blood.   Follow up with your primary care doctor/NP about your blood pressure medicine.

## 2022-03-18 ENCOUNTER — Other Ambulatory Visit: Payer: Self-pay | Admitting: Primary Care

## 2022-03-18 ENCOUNTER — Other Ambulatory Visit: Payer: Self-pay | Admitting: Neurology

## 2022-03-18 DIAGNOSIS — R251 Tremor, unspecified: Secondary | ICD-10-CM

## 2022-03-18 DIAGNOSIS — E039 Hypothyroidism, unspecified: Secondary | ICD-10-CM

## 2022-03-18 DIAGNOSIS — I10 Essential (primary) hypertension: Secondary | ICD-10-CM

## 2022-03-18 DIAGNOSIS — E7800 Pure hypercholesterolemia, unspecified: Secondary | ICD-10-CM

## 2022-03-18 DIAGNOSIS — G250 Essential tremor: Secondary | ICD-10-CM

## 2022-03-18 DIAGNOSIS — R053 Chronic cough: Secondary | ICD-10-CM

## 2022-03-19 NOTE — Telephone Encounter (Signed)
Left message to return call to our office.  

## 2022-03-19 NOTE — Telephone Encounter (Signed)
Patient called returning call she received. I did get her scheduled for her CPE. Wasn't sure if you needed to speak with her regarding something else. Thank you!

## 2022-03-19 NOTE — Telephone Encounter (Signed)
Patient is due for general follow-up/CPE in late August.  She will need to be scheduled in order to receive refills.  Let me know once she is scheduled.

## 2022-03-21 NOTE — Telephone Encounter (Signed)
Noted. Refill(s) sent to pharmacy.  

## 2022-03-25 ENCOUNTER — Other Ambulatory Visit (HOSPITAL_COMMUNITY): Payer: Self-pay

## 2022-04-02 ENCOUNTER — Encounter: Payer: Medicare PPO | Admitting: Primary Care

## 2022-04-04 ENCOUNTER — Ambulatory Visit: Payer: Medicare PPO | Admitting: Neurology

## 2022-04-08 NOTE — Progress Notes (Unsigned)
Assessment/Plan:   1.  Essential Tremor, exacerbated by GAD             -continue primidone, 50 mg bid             -Continue propranolol, 40 mg.  Did not want to increase this because of history of depression 2.  Chronic migraine/chronic daily headache  -okay right now 3.  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 [name] 4.  Hypertension             -Patient on 2 antihypertensives.  This is in addition to the propranolol I have her on for tremor.  BP looks okay today 5.  Cough  -Following with pulmonary.  They feel that some is due to reflux.  They are also treating her gabapentin for "neurogenic cough" but she was just referred to ENT for cough.  6.  Falls  -due to chronic ankle issue on the R per patient.  Discussed use of cane. 7.  F/u 1 year Subjective:   Jamie Patterson was seen today in follow up for essential tremor.  My previous records were reviewed prior to todays visit.  She remains on primidone and propranolol.  She continues to follow with pulmonary for chronic cough.  She was hospitalized at the end of July for hemoptysis.  CT scan showed possible bronchitis/pneumonia.  Pulmonary referred her to ENT as they felt that it was a sinus issue.  Has had several falls.  She attributes to R ankle issues (chronic) and was told non surgical now.  With the one fall, she got her foot caught in a hose outside and fell.  She then fell at her granddaughters ball game and hit her forehead.    Current prescribed movement disorder medications: Primidone, 50 mg twice daily  Propranolol, 40 mg daily (She is already on gabapentin, 300/300/600 for cough)   ALLERGIES:   Allergies  Allergen Reactions   Amlodipine Swelling    Ankle edema    CURRENT MEDICATIONS:  Outpatient Encounter Medications as of 04/10/2022  Medication Sig   acetaminophen (TYLENOL) 325  MG tablet Take 2 tablets (650 mg total) by mouth every 6 (six) hours as needed for mild pain (or Fever >/= 101).   albuterol (VENTOLIN HFA) 108 (90 Base) MCG/ACT inhaler Inhale 2 puffs into the lungs every 6 (six) hours as needed for wheezing or shortness of breath.   Ascorbic Acid (VITAMIN C) 1000 MG tablet Take 1,000 mg by mouth daily.   cholecalciferol (VITAMIN D3) 25 MCG (1000 UNIT) tablet Take 1,000 Units by mouth daily.   cycloSPORINE (RESTASIS) 0.05 % ophthalmic emulsion Place 1 drop into both eyes 2 (two) times daily.   DULoxetine (CYMBALTA) 60 MG capsule Take 60 mg by mouth 2 (two) times daily.    escitalopram (LEXAPRO) 10 MG tablet Take 10 mg by mouth daily.    ferrous sulfate 325 (65 FE) MG tablet Take 325 mg by mouth daily with breakfast.   gabapentin (NEURONTIN) 300 MG capsule Take 300-1,200 mg by mouth See admin instructions. Take 1 capsule four times a day, then take 4 capsules at bedtime   HYDROcodone bit-homatropine (HYCODAN) 5-1.5 MG/5ML syrup Take 5 mLs by mouth 2 (two) times daily.   lamoTRIgine (LAMICTAL) 150 MG tablet TAKE 1  TABLET BY MOUTH TWICE A DAY (Patient taking differently: Take 150 mg by mouth 2 (two) times daily.)   levothyroxine (SYNTHROID) 50 MCG tablet TAKE 1 TABLET BY MOUTH ONCE A DAY. TAKE ON AN EMPTY STOMACH WITH A GLASS OF WATER ATLEAST 30-60 MIN BEFORE BREAKFAST.Office visit required for further refills.   MAGNESIUM PO Take 400 mg by mouth 3 (three) times daily.    Melatonin 10 MG TABS Take 2 tablets by mouth at bedtime.    Multiple Vitamin (MULTIVITAMIN) tablet Take 1 tablet by mouth daily.   olmesartan (BENICAR) 20 MG tablet Take 1 tablet (20 mg total) by mouth daily. for blood pressure. Office visit required for further refills.   Omega-3 Fatty Acids (FISH OIL PO) Take 1 tablet by mouth daily.    ondansetron (ZOFRAN) 4 MG tablet Take 1 tablet (4 mg total) by mouth every 6 (six) hours as needed for nausea.   primidone (MYSOLINE) 50 MG tablet TAKE 1 TABLET BY  MOUTH EVERY MORNING ANDAT BEDTIME   propranolol (INDERAL) 40 MG tablet TAKE 1 TABLET BY MOUTH ONCE A DAY   rosuvastatin (CRESTOR) 10 MG tablet Take 1 tablet (10 mg total) by mouth daily. for cholesterol. Office visit required for further refills.   tiZANidine (ZANAFLEX) 4 MG tablet Take 8 mg by mouth at bedtime.   vitamin B-12 (CYANOCOBALAMIN) 1000 MCG tablet Take 1,000 mcg by mouth daily.   montelukast (SINGULAIR) 10 MG tablet Take 1 tablet (10 mg total) by mouth at bedtime. FOR COUGH AND ALLERGIES. Office visit required for further refills. (Patient not taking: Reported on 04/10/2022)   pantoprazole (PROTONIX) 40 MG tablet Take 1 tablet (40 mg total) by mouth daily. (Patient not taking: Reported on 04/10/2022)   No facility-administered encounter medications on file as of 04/10/2022.     Objective:    PHYSICAL EXAMINATION:    VITALS:   Vitals:   04/10/22 1426  BP: 135/88  Pulse: 78  SpO2: 98%  Weight: 221 lb (100.2 kg)  Height: 5\' 6"  (1.676 m)     GEN:  The patient appears stated age and is in NAD. HEENT:  Normocephalic, atraumatic  Neurological examination:  Orientation: The patient is alert and oriented x3. Cranial nerves: There is good facial symmetry. The speech is fluent and clear. Soft palate rises symmetrically and there is no tongue deviation. Hearing is intact to conversational tone. Sensation: Sensation is intact to light touch throughout Motor: Strength is at least antigravity x4.  Movement examination: Tone: There is normal tone in the UE/LE Abnormal movements: no rest tremor.  No significant postural tremor.  Mild trouble with archimedes spirals. Coordination:  There is no decremation with RAM's, with any form of RAMS, including alternating supination and pronation of the forearm, hand opening and closing, finger taps, heel taps and toe taps. Gait and Station: The patient has no difficulty arising.  Gait is antalgic I have reviewed and interpreted the following  labs independently   Chemistry      Component Value Date/Time   NA 137 03/05/2022 0952   K 3.2 (L) 03/05/2022 0952   CL 100 03/05/2022 0952   CO2 27 03/05/2022 0952   BUN 11 03/05/2022 0952   CREATININE 0.61 03/05/2022 0952   CREATININE 0.67 02/16/2018 1332      Component Value Date/Time   CALCIUM 9.2 03/05/2022 0952   ALKPHOS 75 03/05/2022 0952   AST 18 03/05/2022 0952   ALT 15 03/05/2022 0952   BILITOT 0.5 03/05/2022 03/07/2022  Lab Results  Component Value Date   WBC 8.3 03/05/2022   HGB 12.8 03/05/2022   HCT 37.6 03/05/2022   MCV 97.9 03/05/2022   PLT 256 03/05/2022   Lab Results  Component Value Date   TSH 3.39 03/14/2021     Chemistry      Component Value Date/Time   NA 137 03/05/2022 0952   K 3.2 (L) 03/05/2022 0952   CL 100 03/05/2022 0952   CO2 27 03/05/2022 0952   BUN 11 03/05/2022 0952   CREATININE 0.61 03/05/2022 0952   CREATININE 0.67 02/16/2018 1332      Component Value Date/Time   CALCIUM 9.2 03/05/2022 0952   ALKPHOS 75 03/05/2022 0952   AST 18 03/05/2022 0952   ALT 15 03/05/2022 0952   BILITOT 0.5 03/05/2022 2585        Cc:  Doreene Nest, NP

## 2022-04-10 ENCOUNTER — Encounter: Payer: Self-pay | Admitting: Neurology

## 2022-04-10 ENCOUNTER — Ambulatory Visit: Payer: Medicare PPO | Admitting: Neurology

## 2022-04-10 VITALS — BP 135/88 | HR 78 | Ht 66.0 in | Wt 221.0 lb

## 2022-04-10 DIAGNOSIS — G250 Essential tremor: Secondary | ICD-10-CM | POA: Diagnosis not present

## 2022-04-10 DIAGNOSIS — W19XXXD Unspecified fall, subsequent encounter: Secondary | ICD-10-CM

## 2022-04-11 DIAGNOSIS — J329 Chronic sinusitis, unspecified: Secondary | ICD-10-CM | POA: Diagnosis not present

## 2022-04-11 DIAGNOSIS — R1314 Dysphagia, pharyngoesophageal phase: Secondary | ICD-10-CM | POA: Diagnosis not present

## 2022-04-11 DIAGNOSIS — J324 Chronic pansinusitis: Secondary | ICD-10-CM | POA: Diagnosis not present

## 2022-04-17 ENCOUNTER — Other Ambulatory Visit: Payer: Self-pay | Admitting: Primary Care

## 2022-04-17 DIAGNOSIS — I10 Essential (primary) hypertension: Secondary | ICD-10-CM

## 2022-04-17 NOTE — Telephone Encounter (Signed)
Received refill request for HCTZ for blood pressure. I don't see this on her medication list any longer. Just want to double check. We will see her in a few weeks.

## 2022-04-21 NOTE — Telephone Encounter (Signed)
Called patient she has not been taking the HCTZ. She was informed for appointment with Jae Dire.

## 2022-04-22 ENCOUNTER — Other Ambulatory Visit (HOSPITAL_COMMUNITY): Payer: Self-pay

## 2022-04-30 ENCOUNTER — Ambulatory Visit: Payer: Medicare PPO | Admitting: Internal Medicine

## 2022-05-01 ENCOUNTER — Other Ambulatory Visit (INDEPENDENT_AMBULATORY_CARE_PROVIDER_SITE_OTHER): Payer: Medicare PPO

## 2022-05-01 ENCOUNTER — Ambulatory Visit (INDEPENDENT_AMBULATORY_CARE_PROVIDER_SITE_OTHER): Payer: Medicare PPO | Admitting: Primary Care

## 2022-05-01 VITALS — BP 130/88 | HR 84 | Temp 97.9°F | Ht 66.0 in | Wt 221.0 lb

## 2022-05-01 DIAGNOSIS — R251 Tremor, unspecified: Secondary | ICD-10-CM

## 2022-05-01 DIAGNOSIS — R042 Hemoptysis: Secondary | ICD-10-CM

## 2022-05-01 DIAGNOSIS — G40909 Epilepsy, unspecified, not intractable, without status epilepticus: Secondary | ICD-10-CM | POA: Diagnosis not present

## 2022-05-01 DIAGNOSIS — D509 Iron deficiency anemia, unspecified: Secondary | ICD-10-CM

## 2022-05-01 DIAGNOSIS — I10 Essential (primary) hypertension: Secondary | ICD-10-CM

## 2022-05-01 DIAGNOSIS — Z0000 Encounter for general adult medical examination without abnormal findings: Secondary | ICD-10-CM

## 2022-05-01 DIAGNOSIS — K5900 Constipation, unspecified: Secondary | ICD-10-CM | POA: Diagnosis not present

## 2022-05-01 DIAGNOSIS — F331 Major depressive disorder, recurrent, moderate: Secondary | ICD-10-CM

## 2022-05-01 DIAGNOSIS — R7303 Prediabetes: Secondary | ICD-10-CM

## 2022-05-01 DIAGNOSIS — E7800 Pure hypercholesterolemia, unspecified: Secondary | ICD-10-CM | POA: Diagnosis not present

## 2022-05-01 DIAGNOSIS — R053 Chronic cough: Secondary | ICD-10-CM | POA: Diagnosis not present

## 2022-05-01 DIAGNOSIS — E038 Other specified hypothyroidism: Secondary | ICD-10-CM

## 2022-05-01 DIAGNOSIS — Z23 Encounter for immunization: Secondary | ICD-10-CM | POA: Diagnosis not present

## 2022-05-01 DIAGNOSIS — E538 Deficiency of other specified B group vitamins: Secondary | ICD-10-CM | POA: Diagnosis not present

## 2022-05-01 DIAGNOSIS — E278 Other specified disorders of adrenal gland: Secondary | ICD-10-CM

## 2022-05-01 LAB — LIPID PANEL
Cholesterol: 211 mg/dL — ABNORMAL HIGH (ref 0–200)
HDL: 71.7 mg/dL (ref >39.00)
LDL Cholesterol: 111 mg/dL — ABNORMAL HIGH (ref 0–99)
NonHDL: 138.95
Total CHOL/HDL Ratio: 3
Triglycerides: 138 mg/dL (ref 0.0–149.0)
VLDL: 27.6 mg/dL (ref 0.0–40.0)

## 2022-05-01 LAB — BASIC METABOLIC PANEL
BUN: 10 mg/dL (ref 6–23)
CO2: 27 mEq/L (ref 19–32)
Calcium: 9.7 mg/dL (ref 8.4–10.5)
Chloride: 101 mEq/L (ref 96–112)
Creatinine, Ser: 0.73 mg/dL (ref 0.40–1.20)
GFR: 89.42 mL/min (ref >60.00)
Glucose, Bld: 111 mg/dL — ABNORMAL HIGH (ref 70–99)
Potassium: 4.1 mEq/L (ref 3.5–5.1)
Sodium: 136 mEq/L (ref 135–145)

## 2022-05-01 NOTE — Assessment & Plan Note (Signed)
Following with neurology, office notes reviewed from August 2023. Controlled.  Continue propranolol 40 mg daily, Primidone 50 mg BID.

## 2022-05-01 NOTE — Patient Instructions (Signed)
Stop by the lab prior to leaving today. I will notify you of your results once received.   Follow up with GYN as scheduled.  You are due for a repeat CT scan in July 2024.  Be sure to take your levothyroxine (thyroid medication) every morning on an empty stomach with water only.   No food or other medications for 30 minutes.   No heartburn medication, iron pills, calcium, vitamin D, or magnesium pills within four hours of taking levothyroxine.   It was a pleasure to see you today!  Preventive Care 71-27 Years Old, Female Preventive care refers to lifestyle choices and visits with your health care provider that can promote health and wellness. Preventive care visits are also called wellness exams. What can I expect for my preventive care visit? Counseling Your health care provider may ask you questions about your: Medical history, including: Past medical problems. Family medical history. Pregnancy history. Current health, including: Menstrual cycle. Method of birth control. Emotional well-being. Home life and relationship well-being. Sexual activity and sexual health. Lifestyle, including: Alcohol, nicotine or tobacco, and drug use. Access to firearms. Diet, exercise, and sleep habits. Work and work Statistician. Sunscreen use. Safety issues such as seatbelt and bike helmet use. Physical exam Your health care provider will check your: Height and weight. These may be used to calculate your BMI (body mass index). BMI is a measurement that tells if you are at a healthy weight. Waist circumference. This measures the distance around your waistline. This measurement also tells if you are at a healthy weight and may help predict your risk of certain diseases, such as type 2 diabetes and high blood pressure. Heart rate and blood pressure. Body temperature. Skin for abnormal spots. What immunizations do I need?  Vaccines are usually given at various ages, according to a schedule.  Your health care provider will recommend vaccines for you based on your age, medical history, and lifestyle or other factors, such as travel or where you work. What tests do I need? Screening Your health care provider may recommend screening tests for certain conditions. This may include: Lipid and cholesterol levels. Diabetes screening. This is done by checking your blood sugar (glucose) after you have not eaten for a while (fasting). Pelvic exam and Pap test. Hepatitis B test. Hepatitis C test. HIV (human immunodeficiency virus) test. STI (sexually transmitted infection) testing, if you are at risk. Lung cancer screening. Colorectal cancer screening. Mammogram. Talk with your health care provider about when you should start having regular mammograms. This may depend on whether you have a family history of breast cancer. BRCA-related cancer screening. This may be done if you have a family history of breast, ovarian, tubal, or peritoneal cancers. Bone density scan. This is done to screen for osteoporosis. Talk with your health care provider about your test results, treatment options, and if necessary, the need for more tests. Follow these instructions at home: Eating and drinking  Eat a diet that includes fresh fruits and vegetables, whole grains, lean protein, and low-fat dairy products. Take vitamin and mineral supplements as recommended by your health care provider. Do not drink alcohol if: Your health care provider tells you not to drink. You are pregnant, may be pregnant, or are planning to become pregnant. If you drink alcohol: Limit how much you have to 0-1 drink a day. Know how much alcohol is in your drink. In the [place] one drink equals one 12 oz bottle of beer (355 mL), one 5 oz glass  of wine (148 mL), or one 1 oz glass of hard liquor (44 mL). Lifestyle Brush your teeth every morning and night with fluoride toothpaste. Floss one time each day. Exercise for at least 30 minutes  5 or more days each week. Do not use any products that contain nicotine or tobacco. These products include cigarettes, chewing tobacco, and vaping devices, such as e-cigarettes. If you need help quitting, ask your health care provider. Do not use drugs. If you are sexually active, practice safe sex. Use a condom or other form of protection to prevent STIs. If you do not wish to become pregnant, use a form of birth control. If you plan to become pregnant, see your health care provider for a prepregnancy visit. Take aspirin only as told by your health care provider. Make sure that you understand how much to take and what form to take. Work with your health care provider to find out whether it is safe and beneficial for you to take aspirin daily. Find healthy ways to manage stress, such as: Meditation, yoga, or listening to music. Journaling. Talking to a trusted person. Spending time with friends and family. Minimize exposure to UV radiation to reduce your risk of skin cancer. Safety Always wear your seat belt while driving or riding in a vehicle. Do not drive: If you have been drinking alcohol. Do not ride with someone who has been drinking. When you are tired or distracted. While texting. If you have been using any mind-altering substances or drugs. Wear a helmet and other protective equipment during sports activities. If you have firearms in your house, make sure you follow all gun safety procedures. Seek help if you have been physically or sexually abused. What's next? Visit your health care provider once a year for an annual wellness visit. Ask your health care provider how often you should have your eyes and teeth checked. Stay up to date on all vaccines. This information is not intended to replace advice given to you by your health care provider. Make sure you discuss any questions you have with your health care provider. Document Revised: 01/23/2021 Document Reviewed:  01/23/2021 Elsevier Patient Education  [place].

## 2022-05-01 NOTE — Assessment & Plan Note (Signed)
Immunizations UTD. Influenza vaccine provided today. Pap smear due, follows with GYN and is scheduled. Mammogram due, she will have done at GYN office. Colon cancer screening UTD, due for Cologuard screening in 2024.  Discussed the importance of a healthy diet and regular exercise in order for weight loss, and to reduce the risk of further co-morbidity.  Exam stable. Labs pending.  Follow up in 1 year for repeat physical.

## 2022-05-01 NOTE — Assessment & Plan Note (Signed)
Controlled.  Continue olmesartan 20 mg daily. BMP pending.

## 2022-05-01 NOTE — Assessment & Plan Note (Signed)
Continue ferrous sulfate 325 mg every other day. Repeat iron levels pending.  Discussed to separate iron 4 hours from levothyroxine.

## 2022-05-01 NOTE — Assessment & Plan Note (Addendum)
She is taking levothyroxine incorrectly. Discussed to separate magnesium, iron, and vitamins 4 hours from levothyroxine.  Continue levothyroxine 50 mcg daily. Repeat TSH pending.

## 2022-05-01 NOTE — Assessment & Plan Note (Signed)
Recent hospitalization, reviewed hospital notes, labs, imaging. Unclear etiology.  Continue with ENT and pulmonology follow up.

## 2022-05-01 NOTE — Assessment & Plan Note (Signed)
Reviewed CT chest from July 2023. Repeat CT washout due in July 2024 ,reviewed with patient today.

## 2022-05-01 NOTE — Assessment & Plan Note (Signed)
Discussed the importance of a healthy diet and regular exercise in order for weight loss, and to reduce the risk of further co-morbidity. ? ?Repeat A1C pending. ?

## 2022-05-01 NOTE — Assessment & Plan Note (Signed)
Following with neurology, office notes reviewed from August 2023. No recent seizures.  Continue Lamictal 150 mg daily.

## 2022-05-01 NOTE — Assessment & Plan Note (Signed)
Repeat lipid panel pending. Continue rosuvastatin 10 mg daily. 

## 2022-05-01 NOTE — Progress Notes (Signed)
Subjective:    Patient ID: Jamie Patterson, female    DOB: 04-Nov-1960, 61 y.o.   MRN: [id number]  HPI  Jamie Patterson is a very pleasant 61 y.o. female who presents today for complete physical and follow up of chronic conditions.  Immunizations: -Tetanus: 2013 -Influenza: Due today -Covid-19: 2 vaccines -Shingles: Completed 1 dose of Shingrix, she will have done. -Pneumonia:  2016  Diet: Fair diet.  Exercise: No regular exercise.  Eye exam: Completes annually  Dental exam: Completes semi-annually   Pap Smear: Completed in 2020, scheduled with GYN Mammogram: Completed in December 2021, scheduled with GYN.  Colonoscopy: Completed Cologuard in 2021  BP Readings from Last 3 Encounters:  05/01/22 130/88  04/10/22 135/88  03/13/22 (!) 130/100     Review of Systems  Constitutional:  Negative for unexpected weight change.  HENT:  Negative for rhinorrhea.   Respiratory:  Positive for cough. Negative for shortness of breath.   Cardiovascular:  Negative for chest pain.  Gastrointestinal:  Negative for constipation and diarrhea.  Genitourinary:  Negative for difficulty urinating.  Musculoskeletal:  Positive for arthralgias.  Skin:  Negative for rash.  Allergic/Immunologic: Negative for environmental allergies.  Neurological:  Negative for dizziness, tremors, numbness and headaches.  Psychiatric/Behavioral:  The patient is not nervous/anxious.          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   Trimalleolar fracture on right s/p fusion     Benign essential tremor    worsened by anxiety   Chronic pain due to injury    at R ankle due to ankle fracture   Depression    followed by psych   GERD (gastroesophageal reflux disease)    History of hypertension    HLD (hyperlipidemia)    Imbalance    likely multifactorial (s/p balance training at [facility] 08/2010) ?CMT   Influenza A 09/15/2018   Iron deficiency anemia    LLL pneumonia  10/05/2018   Right ankle pain 2003   trimalleolar fx s/p fusion   Seizure (HCC)    focal, ex vacuo ventriculomegaly?, pending neuro w/u with sleep deprived EEG   Syncope and collapse 08/19/2011    Social History   Socioeconomic History   Marital status: Married    Spouse name: Not on file   Number of children: 1   Years of education: Not on file   Highest education level: Not on file  Occupational History   Occupation: Disabled    Comment: depression   Occupation: DISABILITY    Employer: UNEMPLOYED  Tobacco Use   Smoking status: Former    Packs/day: 1.00    Years: 20.00    Total pack years: 20.00    Types: Cigarettes    Quit date: 08/12/1995    Years since quitting: 26.7   Smokeless tobacco: Never  Vaping Use   Vaping Use: Never used  Substance and Sexual Activity   Alcohol use: No    Alcohol/week: 0.0 standard drinks of alcohol    Comment: occassinal   Drug use: No   Sexual activity: Not on file  Other Topics Concern   Not on file  Social History Narrative   No caffeine. Divorced. 1 child. 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, although endorses that she falls a lot    Social Determinants of Health   Financial Resource Strain: Low Risk  (10/29/2021)  Overall Financial Resource Strain (CARDIA)    Difficulty of Paying Living Expenses: Not hard at all  Food Insecurity: No Food Insecurity (10/29/2021)   Hunger Vital Sign    Worried About Running Out of Food in the Last Year: Never true    Ran Out of Food in the Last Year: Never true  Transportation Needs: No Transportation Needs (10/29/2021)   PRAPARE - Administrator, Civil Service (Medical): No    Lack of Transportation (Non-Medical): No  Physical Activity: Inactive (10/29/2021)   Exercise Vital Sign    Days of Exercise per Week: 0 days    Minutes of Exercise per Session: 0 min  Stress: Stress Concern Present (10/29/2021)   [employer] of Occupational Health - Occupational Stress Questionnaire    Feeling of Stress : To some extent  Social Connections: Moderately Isolated (10/29/2021)   Social Connection and Isolation Panel [NHANES]    Frequency of Communication with Friends and Family: More than three times a week    Frequency of Social Gatherings with Friends and Family: More than three times a week    Attends Religious Services: Never    Database administrator or Organizations: Not on file    Attends Banker Meetings: Never    Marital Status: Married  Catering manager Violence: Not At Risk (10/29/2021)   Humiliation, Afraid, Rape, and Kick questionnaire    Fear of Current or Ex-Partner: No    Emotionally Abused: No    Physically Abused: No    Sexually Abused: No    Past Surgical History:  Procedure Laterality Date   ANKLE FUSION  02/2004   Right-50% disability    BRONCHIAL WASHINGS  03/04/2022   Procedure: BRONCHIAL WASHINGS;  Surgeon: Kalman Shan, MD;  Location: [facility];  Service: Endoscopy;;   CARPAL TUNNEL RELEASE  12/2017   L CTR repair, R thumb A1 pulley release (Gramig)   CESAREAN SECTION     HAMMER TOE SURGERY  09/12/2019   hospitalization  08/2011   syncope thought 2/2 seizure, MRI - Hyperintensity in the cerebral white matter bilaterally is nonspecific, started on Keppra   ORIF ANKLE FRACTURE  06/2003   Right   VIDEO BRONCHOSCOPY Bilateral 03/04/2022   Procedure: VIDEO BRONCHOSCOPY WITHOUT FLUORO;  Surgeon: Kalman Shan, MD;  Location: [facility];  Service: Endoscopy;  Laterality: Bilateral;    Family History  Problem Relation Age of Onset   Depression Mother    Arrhythmia Mother 49       Dx with VTach after having sudden collapse, did NOT have h/o AMI's    Atrial fibrillation Father    Hypertension Father    Osteoarthritis Father    Aortic aneurysm Father    Hepatitis Brother        Hep. C   Anemia Sister     Allergies  Allergen Reactions    Amlodipine Swelling    Ankle edema    Current Outpatient Medications on File Prior to Visit  Medication Sig Dispense Refill   acetaminophen (TYLENOL) 325 MG tablet Take 2 tablets (650 mg total) by mouth every 6 (six) hours as needed for mild pain (or Fever >/= 101). 20 tablet 0   albuterol (VENTOLIN HFA) 108 (90 Base) MCG/ACT inhaler Inhale 2 puffs into the lungs every 6 (six) hours as needed for wheezing or shortness of breath. 8 each 11   Ascorbic Acid (VITAMIN C) 1000 MG tablet Take 1,000 mg by mouth daily.     cholecalciferol (  VITAMIN D3) 25 MCG (1000 UNIT) tablet Take 1,000 Units by mouth daily.     cycloSPORINE (RESTASIS) 0.05 % ophthalmic emulsion Place 1 drop into both eyes 2 (two) times daily.     doxycycline (VIBRAMYCIN) 100 MG capsule Take 100 mg by mouth 2 (two) times daily.     DULoxetine (CYMBALTA) 60 MG capsule Take 60 mg by mouth 2 (two) times daily.      escitalopram (LEXAPRO) 10 MG tablet Take 10 mg by mouth daily.      ferrous sulfate 325 (65 FE) MG tablet Take 325 mg by mouth daily with breakfast.     gabapentin (NEURONTIN) 300 MG capsule Take 300-1,200 mg by mouth See admin instructions. Take 1 capsule four times a day, then take 4 capsules at bedtime     lamoTRIgine (LAMICTAL) 150 MG tablet TAKE 1 TABLET BY MOUTH TWICE A DAY (Patient taking differently: Take 150 mg by mouth 2 (two) times daily.) 180 tablet 1   levothyroxine (SYNTHROID) 50 MCG tablet TAKE 1 TABLET BY MOUTH ONCE A DAY. TAKE ON AN EMPTY STOMACH WITH A GLASS OF WATER ATLEAST 30-60 MIN BEFORE BREAKFAST.Office visit required for further refills. 90 tablet 0   MAGNESIUM PO Take 400 mg by mouth 3 (three) times daily.      Melatonin 10 MG TABS Take 2 tablets by mouth at bedtime.      Multiple Vitamin (MULTIVITAMIN) tablet Take 1 tablet by mouth daily.     olmesartan (BENICAR) 20 MG tablet Take 1 tablet (20 mg total) by mouth daily. for blood pressure. Office visit required for further refills. 90 tablet 0    primidone (MYSOLINE) 50 MG tablet TAKE 1 TABLET BY MOUTH EVERY MORNING ANDAT BEDTIME 180 tablet 1   propranolol (INDERAL) 40 MG tablet TAKE 1 TABLET BY MOUTH ONCE A DAY 90 tablet 1   rosuvastatin (CRESTOR) 10 MG tablet Take 1 tablet (10 mg total) by mouth daily. for cholesterol. Office visit required for further refills. 90 tablet 0   tiZANidine (ZANAFLEX) 4 MG tablet Take 8 mg by mouth at bedtime.     vitamin B-12 (CYANOCOBALAMIN) 1000 MCG tablet Take 1,000 mcg by mouth daily.     No current facility-administered medications on file prior to visit.    BP 130/88   Pulse 84   Temp 97.9 F (36.6 C) (Temporal)   Ht 5\' 6"  (1.676 m)   Wt 221 lb (100.2 kg)   LMP 06/01/2013   SpO2 99%   BMI 35.67 kg/m  Objective:   Physical Exam HENT:     Right Ear: Tympanic membrane and ear canal normal.     Left Ear: Tympanic membrane and ear canal normal.     Nose: Nose normal.  Eyes:     Conjunctiva/sclera: Conjunctivae normal.     Pupils: Pupils are equal, round, and reactive to light.  Neck:     Thyroid: No thyromegaly.  Cardiovascular:     Rate and Rhythm: Normal rate and regular rhythm.     Heart sounds: No murmur heard. Pulmonary:     Effort: Pulmonary effort is normal.     Breath sounds: Normal breath sounds. No rales.  Abdominal:     General: Bowel sounds are normal.     Palpations: Abdomen is soft.     Tenderness: There is no abdominal tenderness.  Musculoskeletal:        General: Normal range of motion.     Cervical back: Neck supple.  Lymphadenopathy:  Cervical: No cervical adenopathy.  Skin:    General: Skin is warm and dry.     Findings: No rash.  Neurological:     Mental Status: She is alert and oriented to person, place, and time.     Cranial Nerves: No cranial nerve deficit.     Deep Tendon Reflexes: Reflexes are normal and symmetric.  Psychiatric:        Mood and Affect: Mood normal.           Assessment & Plan:   Problem List Items Addressed This Visit        Cardiovascular and Mediastinum   Essential hypertension (Chronic)    Controlled.  Continue olmesartan 20 mg daily. BMP pending.      Relevant Orders   Basic metabolic panel     Endocrine   Hypothyroidism (Chronic)    She is taking levothyroxine incorrectly. Discussed to separate magnesium, iron, and vitamins 4 hours from levothyroxine.  Continue levothyroxine 50 mcg daily. Repeat TSH pending.      Relevant Orders   TSH     Nervous and Auditory   Seizure disorder [facility])    Following with neurology, office notes reviewed from August 2023. No recent seizures.  Continue Lamictal 150 mg daily.        Other   HYPERCHOLESTEROLEMIA    Repeat lipid panel pending.  Continue rosuvastatin 10 mg daily.      Relevant Orders   Lipid panel   MDD (major depressive disorder), recurrent episode, moderate (HCC)    Following with psychiatry.  Continue Cymbalta 60 mg BID, Lexapro 10 mg daily, gabapentin [phone] BID.      Iron deficiency anemia    Continue ferrous sulfate 325 mg every other day. Repeat iron levels pending.  Discussed to separate iron 4 hours from levothyroxine.      B12 deficiency    Continue B12 BID. Repeat B12 pending.      Relevant Orders   Vitamin B12   Tremor    Following with neurology, office notes reviewed from August 2023. Controlled.  Continue propranolol 40 mg daily, Primidone 50 mg BID.       Constipation    Controlled.  Continue probiotics and magnesium daily.      Chronic cough    Ongoing, following with pulmonology.   Failed PPI therapy. No prior history of asthma.  She will follow up with pulmonology as scheduled.       Preventative health care - Primary    Immunizations UTD. Influenza vaccine provided today. Pap smear due, follows with GYN and is scheduled. Mammogram due, she will have done at GYN office. Colon cancer screening UTD, due for Cologuard screening in 2024.  Discussed the importance of a healthy  diet and regular exercise in order for weight loss, and to reduce the risk of further co-morbidity.  Exam stable. Labs pending.  Follow up in 1 year for repeat physical.       Prediabetes    Discussed the importance of a healthy diet and regular exercise in order for weight loss, and to reduce the risk of further co-morbidity.  Repeat A1C pending.      Relevant Orders   Hemoglobin A1c   Hemoptysis    Recent hospitalization, reviewed hospital notes, labs, imaging. Unclear etiology.  Continue with ENT and pulmonology follow up.      Right adrenal mass [facility])    Reviewed CT chest from July 2023. Repeat CT washout due in July 2024 ,reviewed with  patient today.      Other Visit Diagnoses     Need for immunization against influenza       Relevant Orders   Flu Vaccine QUAD 45mo+IM (Fluarix, Fluzone & Alfiuria Quad PF) (Completed)          Doreene Nest, NP

## 2022-05-01 NOTE — Assessment & Plan Note (Signed)
Controlled.  Continue probiotics and magnesium daily.

## 2022-05-01 NOTE — Assessment & Plan Note (Signed)
Continue B12 BID. Repeat B12 pending.

## 2022-05-01 NOTE — Assessment & Plan Note (Signed)
Following with psychiatry.  Continue Cymbalta 60 mg BID, Lexapro 10 mg daily, gabapentin [phone] BID.

## 2022-05-01 NOTE — Assessment & Plan Note (Signed)
Ongoing, following with pulmonology.   Failed PPI therapy. No prior history of asthma.  She will follow up with pulmonology as scheduled.

## 2022-05-02 LAB — TSH: TSH: 3.13 u[IU]/mL (ref 0.35–5.50)

## 2022-05-02 LAB — VITAMIN B12: Vitamin B-12: 712 pg/mL (ref 211–911)

## 2022-05-05 ENCOUNTER — Other Ambulatory Visit: Payer: Self-pay | Admitting: Primary Care

## 2022-05-05 DIAGNOSIS — E7800 Pure hypercholesterolemia, unspecified: Secondary | ICD-10-CM

## 2022-05-05 LAB — HEMOGLOBIN A1C: Hgb A1c MFr Bld: 5.8 % (ref 4.6–6.5)

## 2022-05-05 MED ORDER — ROSUVASTATIN CALCIUM 20 MG PO TABS: 20.0000 mg | ORAL_TABLET | Freq: Every day | ORAL | 0 refills | Status: DC

## 2022-05-19 DIAGNOSIS — Z01419 Encounter for gynecological examination (general) (routine) without abnormal findings: Secondary | ICD-10-CM | POA: Diagnosis not present

## 2022-05-19 DIAGNOSIS — Z1231 Encounter for screening mammogram for malignant neoplasm of breast: Secondary | ICD-10-CM | POA: Diagnosis not present

## 2022-05-19 DIAGNOSIS — Z6836 Body mass index (BMI) 36.0-36.9, adult: Secondary | ICD-10-CM | POA: Diagnosis not present

## 2022-05-19 LAB — HM MAMMOGRAPHY

## 2022-05-21 DIAGNOSIS — J329 Chronic sinusitis, unspecified: Secondary | ICD-10-CM | POA: Diagnosis not present

## 2022-05-26 ENCOUNTER — Other Ambulatory Visit (HOSPITAL_COMMUNITY): Payer: Self-pay

## 2022-06-02 ENCOUNTER — Ambulatory Visit: Payer: Medicare PPO | Admitting: Internal Medicine

## 2022-06-05 ENCOUNTER — Ambulatory Visit: Payer: Medicare PPO | Admitting: Internal Medicine

## 2022-06-13 ENCOUNTER — Other Ambulatory Visit: Payer: Self-pay | Admitting: Primary Care

## 2022-06-13 DIAGNOSIS — E039 Hypothyroidism, unspecified: Secondary | ICD-10-CM

## 2022-06-13 DIAGNOSIS — I10 Essential (primary) hypertension: Secondary | ICD-10-CM

## 2022-06-14 ENCOUNTER — Other Ambulatory Visit: Payer: Self-pay | Admitting: Primary Care

## 2022-06-14 DIAGNOSIS — E7800 Pure hypercholesterolemia, unspecified: Secondary | ICD-10-CM

## 2022-06-16 ENCOUNTER — Other Ambulatory Visit: Payer: Self-pay | Admitting: Primary Care

## 2022-06-20 ENCOUNTER — Other Ambulatory Visit: Payer: Self-pay

## 2022-07-07 ENCOUNTER — Other Ambulatory Visit (INDEPENDENT_AMBULATORY_CARE_PROVIDER_SITE_OTHER): Payer: Medicare PPO

## 2022-07-07 DIAGNOSIS — E7800 Pure hypercholesterolemia, unspecified: Secondary | ICD-10-CM | POA: Diagnosis not present

## 2022-07-07 LAB — LIPID PANEL
Cholesterol: 179 mg/dL (ref 0–200)
HDL: 66.5 mg/dL (ref >39.00)
LDL Cholesterol: 92 mg/dL (ref 0–99)
NonHDL: 112.21
Total CHOL/HDL Ratio: 3
Triglycerides: 99 mg/dL (ref 0.0–149.0)
VLDL: 19.8 mg/dL (ref 0.0–40.0)

## 2022-07-09 ENCOUNTER — Other Ambulatory Visit (HOSPITAL_COMMUNITY): Payer: Self-pay

## 2022-07-15 ENCOUNTER — Other Ambulatory Visit: Payer: Self-pay | Admitting: Primary Care

## 2022-07-15 DIAGNOSIS — E7800 Pure hypercholesterolemia, unspecified: Secondary | ICD-10-CM

## 2022-07-23 ENCOUNTER — Other Ambulatory Visit: Payer: Self-pay | Admitting: Primary Care

## 2022-07-23 ENCOUNTER — Ambulatory Visit (INDEPENDENT_AMBULATORY_CARE_PROVIDER_SITE_OTHER)
Admission: RE | Admit: 2022-07-23 | Discharge: 2022-07-23 | Disposition: A | Discharge status: Home / Self Care | Payer: Medicare PPO | Source: Ambulatory Visit | Attending: Primary Care | Admitting: Primary Care

## 2022-07-23 ENCOUNTER — Encounter: Payer: Self-pay | Admitting: Primary Care

## 2022-07-23 ENCOUNTER — Ambulatory Visit: Payer: Medicare PPO | Admitting: Primary Care

## 2022-07-23 VITALS — BP 138/88 | HR 88 | Temp 97.9°F | Ht 66.0 in | Wt 214.0 lb

## 2022-07-23 DIAGNOSIS — R053 Chronic cough: Secondary | ICD-10-CM | POA: Diagnosis not present

## 2022-07-23 DIAGNOSIS — J189 Pneumonia, unspecified organism: Secondary | ICD-10-CM

## 2022-07-23 DIAGNOSIS — R051 Acute cough: Secondary | ICD-10-CM

## 2022-07-23 LAB — POCT INFLUENZA A/B
Influenza A, POC: NEGATIVE
Influenza B, POC: NEGATIVE

## 2022-07-23 LAB — POC COVID19 BINAXNOW: SARS Coronavirus 2 Ag: NEGATIVE

## 2022-07-23 MED ORDER — AMOXICILLIN-POT CLAVULANATE 875-125 MG PO TABS: 1.0000 | ORAL_TABLET | Freq: Two times a day (BID) | ORAL | 0 refills | Status: DC

## 2022-07-23 MED ORDER — ALBUTEROL SULFATE HFA 108 (90 BASE) MCG/ACT IN AERS
2.0000 | INHALATION_SPRAY | Freq: Four times a day (QID) | RESPIRATORY_TRACT | 0 refills | Status: DC | PRN
Start: 2022-07-23 — End: 2023-05-26

## 2022-07-23 MED ORDER — HYDROCOD POLI-CHLORPHE POLI ER 10-8 MG/5ML PO SUER: 5.0000 mL | Freq: Two times a day (BID) | ORAL | 0 refills | Status: DC | PRN

## 2022-07-23 MED ORDER — AZITHROMYCIN 250 MG PO TABS: ORAL_TABLET | ORAL | 0 refills | Status: DC

## 2022-07-23 NOTE — Patient Instructions (Signed)
Start Augmentin antibiotics for the infection Take 1 tablet by mouth twice daily for 7 days.  You may take the cough suppressant every 12 hours as needed for cough and rest. Caution this medication contains codeine which may cause drowsiness.   Complete xray(s) prior to leaving today. I will notify you of your results once received.  Shortness of Breath/Wheezing/Cough: Use the albuterol inhaler. Inhale 2 puffs into the lungs every 4 to 6 hours as needed for wheezing, cough, and/or shortness of breath.   It was a pleasure to see you today!

## 2022-07-23 NOTE — Progress Notes (Signed)
Subjective:    Patient ID: Jamie Patterson, female    DOB: 10/11/1960, 61 y.o.   MRN: [id number]  Cough Pertinent negatives include no chills, fever or headaches.    Jamie Patterson is a very pleasant 61 y.o. female with a significant medical history including hypertension, hypothyroidism, seizure disorder, hyperlipidemia, iron deficiency anemia, tobacco abuse, chronic back pain, chronic cough, prediabetes who presents today to discuss cough.  Acute on chronic cough which progressed over the last week.  Symptoms include chest tightness, productive cough, body aches, shortness of breath. Her cough is persistent throughout the day and night. She is no longer on Protonix as it did not help with her cough.   She has not tested for COVID 19 infection. She denies known fevers. She's been taking Coricidin, Delsym without improvement. She's coughing so much and so hard that her back and chest muscles are hurting.   Follows with pulmonology for chronic cough and chronic bronchitis. Last office visit was in August 2023, was referred to ENT for recurrent cough as it was thought to be secondary to sinus disease. She's undergone PFT's, inhalers, and PPI use without improvement.   Evaluated by ENT in late August, was treated with Doxycycline BID for 4 weeks which helped with sinus congestion but not with her chronic cough.   She recently resumed her Symbicort inhaler, isn't sure where she got this from. She does not have an albuterol inhaler on hand.   Review of Systems  Constitutional:  Positive for fatigue. Negative for chills and fever.  HENT:  Positive for congestion.   Respiratory:  Positive for cough. Negative for chest tightness.   Neurological:  Negative for headaches.         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   Trimalleolar fracture on right s/p fusion     Benign essential tremor    worsened by anxiety   Chronic pain due to injury     at R ankle due to ankle fracture   Depression    followed by psych   GERD (gastroesophageal reflux disease)    History of hypertension    HLD (hyperlipidemia)    Imbalance    likely multifactorial (s/p balance training at [facility] 08/2010) ?CMT   Influenza A 09/15/2018   Iron deficiency anemia    LLL pneumonia 10/05/2018   Right ankle pain 2003   trimalleolar fx s/p fusion   Seizure (HCC)    focal, ex vacuo ventriculomegaly?, pending neuro w/u with sleep deprived EEG   Syncope and collapse 08/19/2011    Social History   Socioeconomic History   Marital status: Married    Spouse name: Not on file   Number of children: 1   Years of education: Not on file   Highest education level: Not on file  Occupational History   Occupation: Disabled    Comment: depression   Occupation: DISABILITY    Employer: UNEMPLOYED  Tobacco Use   Smoking status: Former    Packs/day: 1.00    Years: 20.00    Total pack years: 20.00    Types: Cigarettes    Quit date: 08/12/1995    Years since quitting: 26.9   Smokeless tobacco: Never  Vaping Use   Vaping Use: Never used  Substance and Sexual Activity   Alcohol use: No    Alcohol/week: 0.0 standard drinks of alcohol    Comment: occassinal   Drug use: No  Sexual activity: Not on file  Other Topics Concern   Not on file  Social History Narrative   No caffeine. Divorced. 1 child. 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, although endorses that she falls a lot    Social Determinants of Health   Financial Resource Strain: Low Risk  (10/29/2021)   Overall Financial Resource Strain (CARDIA)    Difficulty of Paying Living Expenses: Not hard at all  Food Insecurity: No Food Insecurity (10/29/2021)   Hunger Vital Sign    Worried About Running Out of Food in the Last Year: Never true    Ran Out of Food in the Last Year: Never true  Transportation Needs: No Transportation Needs (10/29/2021)    PRAPARE - Administrator, Civil Service (Medical): No    Lack of Transportation (Non-Medical): No  Physical Activity: Inactive (10/29/2021)   Exercise Vital Sign    Days of Exercise per Week: 0 days    Minutes of Exercise per Session: 0 min  Stress: Stress Concern Present (10/29/2021)   [employer] of Occupational Health - Occupational Stress Questionnaire    Feeling of Stress : To some extent  Social Connections: Moderately Isolated (10/29/2021)   Social Connection and Isolation Panel [NHANES]    Frequency of Communication with Friends and Family: More than three times a week    Frequency of Social Gatherings with Friends and Family: More than three times a week    Attends Religious Services: Never    Database administrator or Organizations: Not on file    Attends Banker Meetings: Never    Marital Status: Married  Catering manager Violence: Not At Risk (10/29/2021)   Humiliation, Afraid, Rape, and Kick questionnaire    Fear of Current or Ex-Partner: No    Emotionally Abused: No    Physically Abused: No    Sexually Abused: No    Past Surgical History:  Procedure Laterality Date   ANKLE FUSION  02/2004   Right-50% disability    BRONCHIAL WASHINGS  03/04/2022   Procedure: BRONCHIAL WASHINGS;  Surgeon: Kalman Shan, MD;  [facility];  Service: Endoscopy;;   CARPAL TUNNEL RELEASE  12/2017   L CTR repair, R thumb A1 pulley release (Gramig)   CESAREAN SECTION     HAMMER TOE SURGERY  09/12/2019   hospitalization  08/2011   syncope thought 2/2 seizure, MRI - Hyperintensity in the cerebral white matter bilaterally is nonspecific, started on Keppra   ORIF ANKLE FRACTURE  06/2003   Right   VIDEO BRONCHOSCOPY Bilateral 03/04/2022   Procedure: VIDEO BRONCHOSCOPY WITHOUT FLUORO;  Surgeon: Kalman Shan, MD;  [facility]: [facility];  Service: Endoscopy;  Laterality: Bilateral;    Family History  Problem Relation Age of Onset   Depression  Mother    Arrhythmia Mother 59       Dx with VTach after having sudden collapse, did NOT have h/o AMI's    Atrial fibrillation Father    Hypertension Father    Osteoarthritis Father    Aortic aneurysm Father    Hepatitis Brother        Hep. C   Anemia Sister     Allergies  Allergen Reactions   Amlodipine Swelling    Ankle edema    Current Outpatient Medications on File Prior to Visit  Medication Sig Dispense Refill   acetaminophen (TYLENOL) 325 MG tablet Take 2 tablets (650 mg total) by mouth  every 6 (six) hours as needed for mild pain (or Fever >/= 101). 20 tablet 0   Ascorbic Acid (VITAMIN C) 1000 MG tablet Take 1,000 mg by mouth daily.     cholecalciferol (VITAMIN D3) 25 MCG (1000 UNIT) tablet Take 1,000 Units by mouth daily.     cycloSPORINE (RESTASIS) 0.05 % ophthalmic emulsion Place 1 drop into both eyes 2 (two) times daily.     DULoxetine (CYMBALTA) 60 MG capsule Take 60 mg by mouth 2 (two) times daily.      escitalopram (LEXAPRO) 10 MG tablet Take 10 mg by mouth daily.      ferrous sulfate 325 (65 FE) MG tablet Take 325 mg by mouth daily with breakfast.     gabapentin (NEURONTIN) 300 MG capsule Take 300-1,200 mg by mouth See admin instructions. Take 1 capsule four times a day, then take 4 capsules at bedtime     lamoTRIgine (LAMICTAL) 150 MG tablet TAKE 1 TABLET BY MOUTH TWICE A DAY (Patient taking differently: Take 150 mg by mouth 2 (two) times daily.) 180 tablet 1   levothyroxine (SYNTHROID) 50 MCG tablet TAKE 1 TABLET BY MOUTH ONCE A DAY. TAKE ON AN EMPTY STOMACH WITH A GLASS OFWATER ATLEAST 30-60 MIN BEFORE BREAKFAST 90 tablet 2   MAGNESIUM PO Take 400 mg by mouth 3 (three) times daily.      Melatonin 10 MG TABS Take 2 tablets by mouth at bedtime.      Multiple Vitamin (MULTIVITAMIN) tablet Take 1 tablet by mouth daily.     olmesartan (BENICAR) 20 MG tablet Take 1 tablet (20 mg total) by mouth daily. for blood pressure. 90 tablet 2   primidone (MYSOLINE) 50 MG tablet  TAKE 1 TABLET BY MOUTH EVERY MORNING ANDAT BEDTIME 180 tablet 1   propranolol (INDERAL) 40 MG tablet TAKE 1 TABLET BY MOUTH ONCE A DAY 90 tablet 1   rosuvastatin (CRESTOR) 20 MG tablet Take 1 tablet (20 mg total) by mouth daily. for cholesterol. 90 tablet 0   tiZANidine (ZANAFLEX) 4 MG tablet Take 8 mg by mouth at bedtime.     vitamin B-12 (CYANOCOBALAMIN) 1000 MCG tablet Take 1,000 mcg by mouth daily.     No current facility-administered medications on file prior to visit.    BP 138/88   Pulse 88   Temp 97.9 F (36.6 C) (Temporal)   Ht 5\' 6"  (1.676 m)   Wt 214 lb (97.1 kg)   LMP 06/01/2013   SpO2 93%   BMI 34.54 kg/m  Objective:   Physical Exam Constitutional:      Appearance: She is ill-appearing.  HENT:     Right Ear: Tympanic membrane and ear canal normal.     Left Ear: Tympanic membrane and ear canal normal.     Nose:     Right Sinus: No maxillary sinus tenderness or frontal sinus tenderness.     Left Sinus: No maxillary sinus tenderness or frontal sinus tenderness.     Mouth/Throat:     Pharynx: No posterior oropharyngeal erythema.  Eyes:     Conjunctiva/sclera: Conjunctivae normal.  Cardiovascular:     Rate and Rhythm: Normal rate and regular rhythm.  Pulmonary:     Effort: Pulmonary effort is normal. No respiratory distress.     Breath sounds: Examination of the right-upper field reveals rhonchi. Examination of the left-upper field reveals rhonchi. Examination of the right-middle field reveals rhonchi. Examination of the right-lower field reveals rhonchi. Examination of the left-lower field reveals rhonchi. Rhonchi present.  No wheezing or rales.     Comments: Persistent congested cough during visit Musculoskeletal:     Cervical back: Neck supple.  Lymphadenopathy:     Cervical: No cervical adenopathy.  Skin:    General: Skin is warm and dry.           Assessment & Plan:   Problem List Items Addressed This Visit       Other   Acute cough - Primary     Acute on chronic.  Symptoms and presentation today worrisome for pneumonia. SHe tested negative today for influenza and COVID-19 infections.  Chest x-ray ordered and pending.  Start Augmentin 874-125 mg twice daily x 7 days. Consider an of macrolide if pneumonia positive.  Prescription for Tussionex sent to pharmacy to use as needed per patient request. Refill provided for albuterol inhaler.  Strict ED precautions provided.      Relevant Medications   amoxicillin-clavulanate (AUGMENTIN) 875-125 MG tablet   chlorpheniramine-HYDROcodone (TUSSIONEX) 10-8 MG/5ML   albuterol (VENTOLIN HFA) 108 (90 Base) MCG/ACT inhaler   Other Relevant Orders   DG Chest 2 View   POC COVID-19 (Completed)   Influenza A/B (Completed)       Doreene Nest, NP

## 2022-07-23 NOTE — Assessment & Plan Note (Addendum)
Acute on chronic.  Symptoms and presentation today worrisome for pneumonia. SHe tested negative today for influenza and COVID-19 infections.  Chest x-ray ordered and pending.  Start Augmentin 874-125 mg twice daily x 7 days. Consider an of macrolide if pneumonia positive.  Prescription for Tussionex sent to pharmacy to use as needed per patient request. Refill provided for albuterol inhaler.  Strict ED precautions provided.

## 2022-08-14 ENCOUNTER — Other Ambulatory Visit: Payer: Self-pay | Admitting: Primary Care

## 2022-09-01 DIAGNOSIS — N958 Other specified menopausal and perimenopausal disorders: Secondary | ICD-10-CM | POA: Diagnosis not present

## 2022-09-01 DIAGNOSIS — E039 Hypothyroidism, unspecified: Secondary | ICD-10-CM | POA: Diagnosis not present

## 2022-09-01 DIAGNOSIS — M8588 Other specified disorders of bone density and structure, other site: Secondary | ICD-10-CM | POA: Diagnosis not present

## 2022-09-01 DIAGNOSIS — R29890 Loss of height: Secondary | ICD-10-CM | POA: Diagnosis not present

## 2022-09-01 LAB — HM DEXA SCAN

## 2022-09-09 ENCOUNTER — Other Ambulatory Visit: Payer: Self-pay | Admitting: Neurology

## 2022-09-09 ENCOUNTER — Other Ambulatory Visit: Payer: Self-pay | Admitting: Primary Care

## 2022-09-09 DIAGNOSIS — E7800 Pure hypercholesterolemia, unspecified: Secondary | ICD-10-CM

## 2022-09-09 DIAGNOSIS — R251 Tremor, unspecified: Secondary | ICD-10-CM

## 2022-09-09 DIAGNOSIS — G250 Essential tremor: Secondary | ICD-10-CM

## 2022-09-12 ENCOUNTER — Other Ambulatory Visit: Payer: Self-pay | Admitting: Primary Care

## 2022-09-13 NOTE — Telephone Encounter (Signed)
Please call patient:  I continue to receive refill requests for hydrochlorothiazide BP mediation and Singulair allergy medication from her pharmacy, but these are not on her medication list.  Has she been taking them?

## 2022-09-15 NOTE — Telephone Encounter (Signed)
Noted. Will decline refill request from pharmacy

## 2022-09-15 NOTE — Telephone Encounter (Signed)
Called and spoke to patient, she states she stopped taking these medications months ago, she does not need a refill. Okay to refuse. She will contact phamracy to let them know she does not need these medication any longer.

## 2022-09-17 ENCOUNTER — Encounter: Payer: Self-pay | Admitting: Primary Care

## 2022-09-18 DIAGNOSIS — S80211A Abrasion, right knee, initial encounter: Secondary | ICD-10-CM | POA: Diagnosis not present

## 2022-09-18 DIAGNOSIS — W010XXA Fall on same level from slipping, tripping and stumbling without subsequent striking against object, initial encounter: Secondary | ICD-10-CM | POA: Diagnosis not present

## 2022-10-16 ENCOUNTER — Other Ambulatory Visit: Payer: Self-pay | Admitting: Primary Care

## 2022-10-16 ENCOUNTER — Other Ambulatory Visit: Payer: Self-pay | Admitting: Internal Medicine

## 2022-12-02 ENCOUNTER — Other Ambulatory Visit: Payer: Self-pay | Admitting: Primary Care

## 2022-12-02 ENCOUNTER — Other Ambulatory Visit: Payer: Self-pay | Admitting: Neurology

## 2022-12-02 DIAGNOSIS — E7800 Pure hypercholesterolemia, unspecified: Secondary | ICD-10-CM

## 2022-12-02 DIAGNOSIS — E039 Hypothyroidism, unspecified: Secondary | ICD-10-CM

## 2022-12-02 DIAGNOSIS — I10 Essential (primary) hypertension: Secondary | ICD-10-CM

## 2022-12-02 DIAGNOSIS — R251 Tremor, unspecified: Secondary | ICD-10-CM

## 2022-12-02 DIAGNOSIS — G250 Essential tremor: Secondary | ICD-10-CM

## 2023-02-06 ENCOUNTER — Encounter (HOSPITAL_BASED_OUTPATIENT_CLINIC_OR_DEPARTMENT_OTHER): Payer: Self-pay

## 2023-02-06 ENCOUNTER — Other Ambulatory Visit: Payer: Self-pay

## 2023-02-06 ENCOUNTER — Emergency Department (HOSPITAL_BASED_OUTPATIENT_CLINIC_OR_DEPARTMENT_OTHER): Payer: Medicare PPO | Admitting: Radiology

## 2023-02-06 ENCOUNTER — Emergency Department (HOSPITAL_BASED_OUTPATIENT_CLINIC_OR_DEPARTMENT_OTHER)
Admission: EM | Admit: 2023-02-06 | Discharge: 2023-02-06 | Disposition: A | Discharge status: Home / Self Care | Payer: Medicare PPO | Attending: Emergency Medicine | Admitting: Emergency Medicine

## 2023-02-06 DIAGNOSIS — M79671 Pain in right foot: Secondary | ICD-10-CM | POA: Diagnosis not present

## 2023-02-06 DIAGNOSIS — I10 Essential (primary) hypertension: Secondary | ICD-10-CM | POA: Diagnosis not present

## 2023-02-06 DIAGNOSIS — M7731 Calcaneal spur, right foot: Secondary | ICD-10-CM | POA: Diagnosis not present

## 2023-02-06 DIAGNOSIS — M25571 Pain in right ankle and joints of right foot: Secondary | ICD-10-CM | POA: Diagnosis not present

## 2023-02-06 DIAGNOSIS — Z4789 Encounter for other orthopedic aftercare: Secondary | ICD-10-CM | POA: Diagnosis not present

## 2023-02-06 DIAGNOSIS — Z87891 Personal history of nicotine dependence: Secondary | ICD-10-CM | POA: Diagnosis not present

## 2023-02-06 DIAGNOSIS — Z043 Encounter for examination and observation following other accident: Secondary | ICD-10-CM | POA: Diagnosis not present

## 2023-02-06 MED ORDER — HYDROCODONE-ACETAMINOPHEN 5-325 MG PO TABS
1.0000 | ORAL_TABLET | Freq: Once | ORAL | Status: AC
  Administered 2023-02-06: 1 via ORAL
  Filled 2023-02-06: qty 1

## 2023-02-06 MED ORDER — NAPROXEN 500 MG PO TABS: 500.0000 mg | ORAL_TABLET | Freq: Two times a day (BID) | ORAL | 0 refills | Status: DC

## 2023-02-06 NOTE — ED Triage Notes (Signed)
Pt states she suffered mechanical fell yesterday, hx R ankle fusion. C/o R foot pain

## 2023-02-06 NOTE — Discharge Instructions (Addendum)
You were evaluated in the Emergency Department and after careful evaluation, we did not find any emergent condition requiring admission or further testing in the hospital.  Your exam/testing today was overall reassuring.  X-rays without any broken bones or emergencies.  Take the Naprosyn twice daily for pain.  Use crutches for comfort, follow-up with orthopedic specialist if not improving over the next week or so.  Please return to the Emergency Department if you experience any worsening of your condition.  Thank you for allowing [place] to be a part of your care.

## 2023-02-06 NOTE — ED Provider Notes (Signed)
[facility] Emergency Department Provider Note MRN:  [id number]  Arrival date & time: 02/06/23     Chief Complaint   Fall   History of Present Illness   Jamie Patterson is a 62 y.o. year-old female with no pertinent past medical history presenting to the ED with chief complaint of fall.  Right foot pain since falling yesterday.  Explains that she had her ankle fused years ago and this causes her to fall on occasion.  Denies passing out or lightheadedness.  Denies any other injuries.   Review of Systems  A thorough review of systems was obtained and all systems are negative except as noted in the HPI and PMH.   Patient's Health History    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   Trimalleolar fracture on right s/p fusion     Benign essential tremor    worsened by anxiety   Chronic pain due to injury    at R ankle due to ankle fracture   Depression    followed by psych   GERD (gastroesophageal reflux disease)    History of hypertension    HLD (hyperlipidemia)    Imbalance    likely multifactorial (s/p balance training at [facility] 08/2010) ?CMT   Influenza A 09/15/2018   Iron deficiency anemia    LLL pneumonia 10/05/2018   Right ankle pain 2003   trimalleolar fx s/p fusion   Seizure (HCC)    focal, ex vacuo ventriculomegaly?, pending neuro w/u with sleep deprived EEG   Syncope and collapse 08/19/2011    Past Surgical History:  Procedure Laterality Date   ANKLE FUSION  02/2004   Right-50% disability    BRONCHIAL WASHINGS  03/04/2022   Procedure: BRONCHIAL WASHINGS;  Surgeon: Kalman Shan, MD;  Location: [facility];  Service: Endoscopy;;   CARPAL TUNNEL RELEASE  12/2017   L CTR repair, R thumb A1 pulley release (Gramig)   CESAREAN SECTION     HAMMER TOE SURGERY  09/12/2019   hospitalization  08/2011   syncope thought 2/2 seizure, MRI - Hyperintensity in the cerebral white matter bilaterally is  nonspecific, started on Keppra   ORIF ANKLE FRACTURE  06/2003   Right   VIDEO BRONCHOSCOPY Bilateral 03/04/2022   Procedure: VIDEO BRONCHOSCOPY WITHOUT FLUORO;  Surgeon: Kalman Shan, MD;  Location: [facility];  Service: Endoscopy;  Laterality: Bilateral;    Family History  Problem Relation Age of Onset   Depression Mother    Arrhythmia Mother 72       Dx with VTach after having sudden collapse, did NOT have h/o AMI's    Atrial fibrillation Father    Hypertension Father    Osteoarthritis Father    Aortic aneurysm Father    Hepatitis Brother        Hep. C   Anemia Sister     Social History   Socioeconomic History   Marital status: Married    Spouse name: Not on file   Number of children: 1   Years of education: Not on file   Highest education level: Not on file  Occupational History   Occupation: Disabled    Comment: depression   Occupation: DISABILITY    Employer: UNEMPLOYED  Tobacco Use   Smoking status: Former    Packs/day: 1.00    Years: 20.00    Additional pack years: 0.00    Total pack years: 20.00    Types: Cigarettes  Quit date: 08/12/1995    Years since quitting: 27.5   Smokeless tobacco: Never  Vaping Use   Vaping Use: Never used  Substance and Sexual Activity   Alcohol use: No    Alcohol/week: 0.0 standard drinks of alcohol    Comment: occassinal   Drug use: No   Sexual activity: Not on file  Other Topics Concern   Not on file  Social History Narrative   No caffeine. Divorced. 1 child. 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, although endorses that she falls a lot    Social Determinants of Health   Financial Resource Strain: Low Risk  (10/29/2021)   Overall Financial Resource Strain (CARDIA)    Difficulty of Paying Living Expenses: Not hard at all  Food Insecurity: No Food Insecurity (10/29/2021)   Hunger Vital Sign    Worried About Running Out of Food in the Last Year: Never  true    Ran Out of Food in the Last Year: Never true  Transportation Needs: No Transportation Needs (10/29/2021)   PRAPARE - Administrator, Civil Service (Medical): No    Lack of Transportation (Non-Medical): No  Physical Activity: Inactive (10/29/2021)   Exercise Vital Sign    Days of Exercise per Week: 0 days    Minutes of Exercise per Session: 0 min  Stress: Stress Concern Present (10/29/2021)   [employer] of Occupational Health - Occupational Stress Questionnaire    Feeling of Stress : To some extent  Social Connections: Moderately Isolated (10/29/2021)   Social Connection and Isolation Panel [NHANES]    Frequency of Communication with Friends and Family: More than three times a week    Frequency of Social Gatherings with Friends and Family: More than three times a week    Attends Religious Services: Never    Database administrator or Organizations: Not on file    Attends Banker Meetings: Never    Marital Status: Married  Catering manager Violence: Not At Risk (10/29/2021)   Humiliation, Afraid, Rape, and Kick questionnaire    Fear of Current or Ex-Partner: No    Emotionally Abused: No    Physically Abused: No    Sexually Abused: No     Physical Exam   Vitals:   02/06/23 0550 02/06/23 0606  BP: (!) 126/91   Pulse: 97   Resp: 16   Temp:  98.2 F (36.8 C)  SpO2: 97%     CONSTITUTIONAL: Well-appearing, NAD NEURO/PSYCH:  Alert and oriented x 3, no focal deficits EYES:  eyes equal and reactive ENT/NECK:  no LAD, no JVD CARDIO: Regular rate, well-perfused, normal S1 and S2 PULM:  CTAB no wheezing or rhonchi GI/GU:  non-distended, non-tender MSK/SPINE:  No gross deformities, no edema SKIN:  no rash, atraumatic   *Additional and/or pertinent findings included in MDM below  Diagnostic and Interventional Summary    EKG Interpretation Date/Time:    Ventricular Rate:    PR Interval:    QRS Duration:    QT Interval:    QTC Calculation:    R Axis:      Text Interpretation:         Labs Reviewed - No data to display  DG Foot Complete Right  Final Result    DG Ankle Complete Right  Final Result      Medications  HYDROcodone-acetaminophen (NORCO/VICODIN) 5-325 MG per tablet 1 tablet (1 tablet Oral Given 02/06/23 0602)  Procedures  /  Critical Care Procedures  ED Course and Medical Decision Making  Initial Impression and Ddx Question sprain versus fracture.  Past medical/surgical history that increases complexity of ED encounter: History of ankle fusion  Interpretation of Diagnostics I personally reviewed the ankle x-ray and my interpretation is as follows: Fused ankle, no obvious fracture    Patient Reassessment and Ultimate Disposition/Management     Discharge  Patient management required discussion with the following services or consulting groups:  None  Complexity of Problems Addressed Acute complicated illness or Injury  Additional Data Reviewed and Analyzed Further history obtained from: Further history from spouse/family member  Additional Factors Impacting ED Encounter Risk None  Elmer Sow. Pilar Plate, MD [facility] Emergency Medicine [facility] [email or web address]  Final Clinical Impressions(s) / ED Diagnoses     ICD-10-CM   1. Right foot pain  M79.671       ED Discharge Orders          Ordered    naproxen (NAPROSYN) 500 MG tablet  2 times daily        02/06/23 0631             Discharge Instructions Discussed with and Provided to Patient:     Discharge Instructions      You were evaluated in the Emergency Department and after careful evaluation, we did not find any emergent condition requiring admission or further testing in the hospital.  Your exam/testing today was overall reassuring.  X-rays without any broken bones or emergencies.  Take the Naprosyn twice daily for pain.  Use crutches for comfort, follow-up with orthopedic specialist if not  improving over the next week or so.  Please return to the Emergency Department if you experience any worsening of your condition.  Thank you for allowing [place] to be a part of your care.        Sabas Sous, MD 02/06/23 [phone]

## 2023-02-06 NOTE — ED Notes (Signed)
All appropriate discharge materials reviewed at length with patient. Time for questions provided. Pt has no other questions at this time and verbalizes understanding of all provided materials.  

## 2023-02-25 ENCOUNTER — Other Ambulatory Visit: Payer: Self-pay | Admitting: Neurology

## 2023-02-25 ENCOUNTER — Other Ambulatory Visit: Payer: Self-pay | Admitting: Primary Care

## 2023-02-25 DIAGNOSIS — E7800 Pure hypercholesterolemia, unspecified: Secondary | ICD-10-CM

## 2023-02-25 DIAGNOSIS — E039 Hypothyroidism, unspecified: Secondary | ICD-10-CM

## 2023-02-25 DIAGNOSIS — G250 Essential tremor: Secondary | ICD-10-CM

## 2023-02-25 DIAGNOSIS — I10 Essential (primary) hypertension: Secondary | ICD-10-CM

## 2023-02-25 DIAGNOSIS — R251 Tremor, unspecified: Secondary | ICD-10-CM

## 2023-03-25 ENCOUNTER — Ambulatory Visit (INDEPENDENT_AMBULATORY_CARE_PROVIDER_SITE_OTHER): Payer: Medicare PPO

## 2023-03-25 VITALS — Ht 65.0 in | Wt 210.0 lb

## 2023-03-25 DIAGNOSIS — Z0000 Encounter for general adult medical examination without abnormal findings: Secondary | ICD-10-CM

## 2023-03-25 DIAGNOSIS — Z1211 Encounter for screening for malignant neoplasm of colon: Secondary | ICD-10-CM

## 2023-03-25 DIAGNOSIS — Z1231 Encounter for screening mammogram for malignant neoplasm of breast: Secondary | ICD-10-CM

## 2023-03-25 NOTE — Patient Instructions (Signed)
Jamie Patterson , Thank you for taking time to come for your Medicare Wellness Visit. I appreciate your ongoing commitment to your health goals. Please review the following plan we discussed and let me know if I can assist you in the future.   Referrals/Orders/Follow-Ups/Clinician Recommendations: Aim for 30 minutes of exercise or brisk walking, 6-8 glasses of water, and 5 servings of fruits and vegetables each day.   This is a list of the screening recommended for you and due dates:  Health Maintenance  Topic Date Due   Zoster (Shingles) Vaccine (2 of 2) 06/08/2020   DTaP/Tdap/Td vaccine (2 - Td or Tdap) 08/27/2021   Pap Smear  04/04/2022   Mammogram  07/18/2022   Medicare Annual Wellness Visit  10/30/2022   Cologuard (Stool DNA test)  03/15/2023   Flu Shot  03/12/2023   Hepatitis C Screening  Completed   HIV Screening  Completed   HPV Vaccine  Aged Out   COVID-19 Vaccine  Discontinued    Advanced directives: (Declined) Advance directive discussed with you today. Even though you declined this today, please call our office should you change your mind, and we can give you the proper paperwork for you to fill out.  Next Medicare Annual Wellness Visit scheduled for next year: Yes  Preventive Care 40-64 Years, Female Preventive care refers to lifestyle choices and visits with your health care provider that can promote health and wellness. What does preventive care include? A yearly physical exam. This is also called an annual well check. Dental exams once or twice a year. Routine eye exams. Ask your health care provider how often you should have your eyes checked. Personal lifestyle choices, including: Daily care of your teeth and gums. Regular physical activity. Eating a healthy diet. Avoiding tobacco and drug use. Limiting alcohol use. Practicing safe sex. Taking low-dose aspirin daily starting at age 38. Taking vitamin and mineral supplements as recommended by your health care  provider. What happens during an annual well check? The services and screenings done by your health care provider during your annual well check will depend on your age, overall health, lifestyle risk factors, and family history of disease. Counseling  Your health care provider may ask you questions about your: Alcohol use. Tobacco use. Drug use. Emotional well-being. Home and relationship well-being. Sexual activity. Eating habits. Work and work Astronomer. Method of birth control. Menstrual cycle. Pregnancy history. Screening  You may have the following tests or measurements: Height, weight, and BMI. Blood pressure. Lipid and cholesterol levels. These may be checked every 5 years, or more frequently if you are over 37 years old. Skin check. Lung cancer screening. You may have this screening every year starting at age 51 if you have a 30-pack-year history of smoking and currently smoke or have quit within the past 15 years. Fecal occult blood test (FOBT) of the stool. You may have this test every year starting at age 45. Flexible sigmoidoscopy or colonoscopy. You may have a sigmoidoscopy every 5 years or a colonoscopy every 10 years starting at age 47. Hepatitis C blood test. Hepatitis B blood test. Sexually transmitted disease (STD) testing. Diabetes screening. This is done by checking your blood sugar (glucose) after you have not eaten for a while (fasting). You may have this done every 1-3 years. Mammogram. This may be done every 1-2 years. Talk to your health care provider about when you should start having regular mammograms. This may depend on whether you have a family history of breast  cancer. BRCA-related cancer screening. This may be done if you have a family history of breast, ovarian, tubal, or peritoneal cancers. Pelvic exam and Pap test. This may be done every 3 years starting at age 54. Starting at age 27, this may be done every 5 years if you have a Pap test in  combination with an HPV test. Bone density scan. This is done to screen for osteoporosis. You may have this scan if you are at high risk for osteoporosis. Discuss your test results, treatment options, and if necessary, the need for more tests with your health care provider. Vaccines  Your health care provider may recommend certain vaccines, such as: Influenza vaccine. This is recommended every year. Tetanus, diphtheria, and acellular pertussis (Tdap, Td) vaccine. You may need a Td booster every 10 years. Zoster vaccine. You may need this after age 34. Pneumococcal 13-valent conjugate (PCV13) vaccine. You may need this if you have certain conditions and were not previously vaccinated. Pneumococcal polysaccharide (PPSV23) vaccine. You may need one or two doses if you smoke cigarettes or if you have certain conditions. Talk to your health care provider about which screenings and vaccines you need and how often you need them. This information is not intended to replace advice given to you by your health care provider. Make sure you discuss any questions you have with your health care provider. Document Released: 08/24/2015 Document Revised: 04/16/2016 Document Reviewed: 05/29/2015 Elsevier Interactive Patient Education  2017 [employer].    Fall Prevention in the Home Falls can cause injuries. They can happen to people of all ages. There are many things you can do to make your home safe and to help prevent falls. What can I do on the outside of my home? Regularly fix the edges of walkways and driveways and fix any cracks. Remove anything that might make you trip as you walk through a door, such as a raised step or threshold. Trim any bushes or trees on the path to your home. Use bright outdoor lighting. Clear any walking paths of anything that might make someone trip, such as rocks or tools. Regularly check to see if handrails are loose or broken. Make sure that both sides of any steps have  handrails. Any raised decks and porches should have guardrails on the edges. Have any leaves, snow, or ice cleared regularly. Use sand or salt on walking paths during winter. Clean up any spills in your garage right away. This includes oil or grease spills. What can I do in the bathroom? Use night lights. Install grab bars by the toilet and in the tub and shower. Do not use towel bars as grab bars. Use non-skid mats or decals in the tub or shower. If you need to sit down in the shower, use a plastic, non-slip stool. Keep the floor dry. Clean up any water that spills on the floor as soon as it happens. Remove soap buildup in the tub or shower regularly. Attach bath mats securely with double-sided non-slip rug tape. Do not have throw rugs and other things on the floor that can make you trip. What can I do in the bedroom? Use night lights. Make sure that you have a light by your bed that is easy to reach. Do not use any sheets or blankets that are too big for your bed. They should not hang down onto the floor. Have a firm chair that has side arms. You can use this for support while you get dressed. Do not  have throw rugs and other things on the floor that can make you trip. What can I do in the kitchen? Clean up any spills right away. Avoid walking on wet floors. Keep items that you use a lot in easy-to-reach places. If you need to reach something above you, use a strong step stool that has a grab bar. Keep electrical cords out of the way. Do not use floor polish or wax that makes floors slippery. If you must use wax, use non-skid floor wax. Do not have throw rugs and other things on the floor that can make you trip. What can I do with my stairs? Do not leave any items on the stairs. Make sure that there are handrails on both sides of the stairs and use them. Fix handrails that are broken or loose. Make sure that handrails are as long as the stairways. Check any carpeting to make sure  that it is firmly attached to the stairs. Fix any carpet that is loose or worn. Avoid having throw rugs at the top or bottom of the stairs. If you do have throw rugs, attach them to the floor with carpet tape. Make sure that you have a light switch at the top of the stairs and the bottom of the stairs. If you do not have them, ask someone to add them for you. What else can I do to help prevent falls? Wear shoes that: Do not have high heels. Have rubber bottoms. Are comfortable and fit you well. Are closed at the toe. Do not wear sandals. If you use a stepladder: Make sure that it is fully opened. Do not climb a closed stepladder. Make sure that both sides of the stepladder are locked into place. Ask someone to hold it for you, if possible. Clearly mark and make sure that you can see: Any grab bars or handrails. First and last steps. Where the edge of each step is. Use tools that help you move around (mobility aids) if they are needed. These include: Canes. Walkers. Scooters. Crutches. Turn on the lights when you go into a dark area. Replace any light bulbs as soon as they burn out. Set up your furniture so you have a clear path. Avoid moving your furniture around. If any of your floors are uneven, fix them. If there are any pets around you, be aware of where they are. Review your medicines with your doctor. Some medicines can make you feel dizzy. This can increase your chance of falling. Ask your doctor what other things that you can do to help prevent falls. This information is not intended to replace advice given to you by your health care provider. Make sure you discuss any questions you have with your health care provider. Document Released: 05/24/2009 Document Revised: 01/03/2016 Document Reviewed: 09/01/2014 Elsevier Interactive Patient Education  2017 [employer].

## 2023-03-25 NOTE — Progress Notes (Signed)
Subjective:   Jamie Patterson is a 62 y.o. female who presents for Medicare Annual (Subsequent) preventive examination.  Visit Complete: Virtual  I connected with  Jamie Patterson on 03/25/23 by a audio enabled telemedicine application and verified that I am speaking with the correct person using two identifiers.  Patient Location: Home  Provider Location: Home Office  I discussed the limitations of evaluation and management by telemedicine. The patient expressed understanding and agreed to proceed.  Vital Signs: Unable to obtain new vitals due to this being a telehealth visit. Pt reported ht and wt.  Review of Systems      Cardiac Risk Factors include: advanced age (>21men, >69 women);sedentary lifestyle;hypertension;obesity (BMI >30kg/m2)     Objective:    Today's Vitals   03/25/23 1358  Weight: 210 lb (95.3 kg)  Height: 5\' 5"  (1.651 m)   Body mass index is 34.95 kg/m.     03/25/2023    2:10 PM 02/06/2023    6:05 AM 04/10/2022    2:29 PM 03/04/2022   12:32 PM 03/03/2022    2:20 PM 10/29/2021   10:39 AM 04/04/2021    2:52 PM  Advanced Directives  Does Patient Have a Medical Advance Directive? No No No No No No No  Would patient like information on creating a medical advance directive? No - Patient declined No - Patient declined   No - Patient declined No - Patient declined     Current Medications (verified) Outpatient Encounter Medications as of 03/25/2023  Medication Sig   acetaminophen (TYLENOL) 325 MG tablet Take 2 tablets (650 mg total) by mouth every 6 (six) hours as needed for mild pain (or Fever >/= 101).   albuterol (VENTOLIN HFA) 108 (90 Base) MCG/ACT inhaler Inhale 2 puffs into the lungs every 6 (six) hours as needed for wheezing or shortness of breath.   Ascorbic Acid (VITAMIN C) 1000 MG tablet Take 1,000 mg by mouth daily.   cholecalciferol (VITAMIN D3) 25 MCG (1000 UNIT) tablet Take 1,000 Units by mouth daily.   cycloSPORINE (RESTASIS) 0.05 % ophthalmic emulsion  Place 1 drop into both eyes 2 (two) times daily.   DULoxetine (CYMBALTA) 60 MG capsule Take 60 mg by mouth 2 (two) times daily.    escitalopram (LEXAPRO) 10 MG tablet Take 10 mg by mouth daily.    ferrous sulfate 325 (65 FE) MG tablet Take 325 mg by mouth daily with breakfast.   gabapentin (NEURONTIN) 300 MG capsule Take 300-1,200 mg by mouth See admin instructions. Take 1 capsule four times a day, then take 4 capsules at bedtime   lamoTRIgine (LAMICTAL) 150 MG tablet TAKE 1 TABLET BY MOUTH TWICE A DAY (Patient taking differently: Take 150 mg by mouth 2 (two) times daily.)   levothyroxine (SYNTHROID) 50 MCG tablet TAKE ONE TABLET BY MOUTH ONCE A DAY. TAKE ON AN EMPTY STOMACH WITH A GLASS OF WATER ATLEAST 30-60 MIN BEFORE BREAKFAST   MAGNESIUM PO Take 400 mg by mouth 3 (three) times daily.    Melatonin 10 MG TABS Take 2 tablets by mouth at bedtime.    Multiple Vitamin (MULTIVITAMIN) tablet Take 1 tablet by mouth daily.   naproxen (NAPROSYN) 500 MG tablet Take 1 tablet (500 mg total) by mouth 2 (two) times daily.   olmesartan (BENICAR) 20 MG tablet TAKE ONE TABLET BY MOUTH ONCE A DAY FOR BLOOD PRESSURE   primidone (MYSOLINE) 50 MG tablet TAKE ONE TABLET BY MOUTH EVERY MORNING AND AT BEDTIME   propranolol (INDERAL)  40 MG tablet TAKE ONE TABLET BY MOUTH ONCE A DAY   rosuvastatin (CRESTOR) 20 MG tablet TAKE ONE TABLET BY MOUTH ONCE A DAY FOR CHOLESTEROL   tiZANidine (ZANAFLEX) 4 MG tablet Take 8 mg by mouth at bedtime.   vitamin B-12 (CYANOCOBALAMIN) 1000 MCG tablet Take 1,000 mcg by mouth daily.   amoxicillin-clavulanate (AUGMENTIN) 875-125 MG tablet Take 1 tablet by mouth 2 (two) times daily. (Patient not taking: Reported on 03/25/2023)   azithromycin (ZITHROMAX) 250 MG tablet Take 2 tablets by mouth today, then 1 tablet daily for 4 additional days. (Patient not taking: Reported on 03/25/2023)   chlorpheniramine-HYDROcodone (TUSSIONEX) 10-8 MG/5ML Take 5 mLs by mouth every 12 (twelve) hours as needed  for cough. (Patient not taking: Reported on 03/25/2023)   No facility-administered encounter medications on file as of 03/25/2023.    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   Trimalleolar fracture on right s/p fusion     Benign essential tremor    worsened by anxiety   Chronic pain due to injury    at R ankle due to ankle fracture   Depression    followed by psych   GERD (gastroesophageal reflux disease)    History of hypertension    HLD (hyperlipidemia)    Imbalance    likely multifactorial (s/p balance training at [facility] 08/2010) ?CMT   Influenza A 09/15/2018   Iron deficiency anemia    LLL pneumonia 10/05/2018   Right ankle pain 2003   trimalleolar fx s/p fusion   Seizure (HCC)    focal, ex vacuo ventriculomegaly?, pending neuro w/u with sleep deprived EEG   Syncope and collapse 08/19/2011   Past Surgical History:  Procedure Laterality Date   ANKLE FUSION  02/2004   Right-50% disability    BRONCHIAL WASHINGS  03/04/2022   Procedure: BRONCHIAL WASHINGS;  Surgeon: Kalman Shan, MD;  Location: [facility];  Service: Endoscopy;;   CARPAL TUNNEL RELEASE  12/2017   L CTR repair, R thumb A1 pulley release (Gramig)   CESAREAN SECTION     HAMMER TOE SURGERY  09/12/2019   hospitalization  08/2011   syncope thought 2/2 seizure, MRI - Hyperintensity in the cerebral white matter bilaterally is nonspecific, started on Keppra   ORIF ANKLE FRACTURE  06/2003   Right   VIDEO BRONCHOSCOPY Bilateral 03/04/2022   Procedure: VIDEO BRONCHOSCOPY WITHOUT FLUORO;  Surgeon: Kalman Shan, MD;  Location: [facility];  Service: Endoscopy;  Laterality: Bilateral;   Family History  Problem Relation Age of Onset   Depression Mother    Arrhythmia Mother 39       Dx with VTach after having sudden collapse, did NOT have h/o AMI's    Atrial fibrillation Father    Hypertension Father     Osteoarthritis Father    Aortic aneurysm Father    Hepatitis Brother        Hep. C   Anemia Sister    Social History   Socioeconomic History   Marital status: Married    Spouse name: Not on file   Number of children: 1   Years of education: Not on file   Highest education level: Not on file  Occupational History   Occupation: Disabled    Comment: depression   Occupation: DISABILITY    Employer: UNEMPLOYED  Tobacco Use   Smoking status: Former    Current packs/day: 0.00    Average packs/day: 1  pack/day for 20.0 years (20.0 ttl pk-yrs)    Types: Cigarettes    Start date: 08/12/1975    Quit date: 08/12/1995    Years since quitting: 27.6   Smokeless tobacco: Never  Vaping Use   Vaping status: Never Used  Substance and Sexual Activity   Alcohol use: No    Alcohol/week: 0.0 standard drinks of alcohol    Comment: occassinal   Drug use: No   Sexual activity: Not on file  Other Topics Concern   Not on file  Social History Narrative   No caffeine. Divorced. 1 child. 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, although endorses that she falls a lot    Social Determinants of Health   Financial Resource Strain: Low Risk  (03/25/2023)   Overall Financial Resource Strain (CARDIA)    Difficulty of Paying Living Expenses: Not hard at all  Food Insecurity: No Food Insecurity (03/25/2023)   Hunger Vital Sign    Worried About Running Out of Food in the Last Year: Never true    Ran Out of Food in the Last Year: Never true  Transportation Needs: No Transportation Needs (03/25/2023)   PRAPARE - Administrator, Civil Service (Medical): No    Lack of Transportation (Non-Medical): No  Physical Activity: Inactive (03/25/2023)   Exercise Vital Sign    Days of Exercise per Week: 0 days    Minutes of Exercise per Session: 0 min  Stress: No Stress Concern Present (03/25/2023)   [employer] of Occupational Health -  Occupational Stress Questionnaire    Feeling of Stress : Not at all  Social Connections: Moderately Isolated (03/25/2023)   Social Connection and Isolation Panel [NHANES]    Frequency of Communication with Friends and Family: More than three times a week    Frequency of Social Gatherings with Friends and Family: More than three times a week    Attends Religious Services: Never    Database administrator or Organizations: No    Attends Engineer, structural: Never    Marital Status: Married    Tobacco Counseling Counseling given: Not Answered   Clinical Intake:  Pre-visit preparation completed: Yes  Pain : No/denies pain     BMI - recorded: 34.95 Nutritional Status: BMI > 30  Obese Nutritional Risks: None Diabetes: No  How often do you need to have someone help you when you read instructions, pamphlets, or other written materials from your doctor or pharmacy?: 1 - Never  Interpreter Needed?: No  Information entered by :: C. lLPN   Activities of Daily Living    03/25/2023    2:12 PM  In your present state of health, do you have any difficulty performing the following activities:  Hearing? 0  Vision? 0  Difficulty concentrating or making decisions? 0  Walking or climbing stairs? 1  Comment Due to fused ankle, has ramp  Dressing or bathing? 0  Doing errands, shopping? 0  Preparing Food and eating ? N  Using the Toilet? N  In the past six months, have you accidently leaked urine? Y  Comment occasionally when coughs  Do you have problems with loss of bowel control? N  Managing your Medications? N  Managing your Finances? N  Housekeeping or managing your Housekeeping? N    Patient Care Team: Doreene Nest, NP as PCP - General (Internal Medicine) [name], MD Tat, Octaviano Batty, DO as Consulting Physician (Neurology)  Charlott Holler, MD as Consulting Physician (Pulmonary Disease) Milagros Evener, MD as Consulting Physician  (Psychiatry)  Indicate any recent Medical Services you may have received from other than [facility] providers in the past year (date may be approximate).     Assessment:   This is a routine wellness examination for Tongela.  Hearing/Vision screen Hearing Screening - Comments:: Denies hearing difficulties   Vision Screening - Comments:: Glasses - [facility] - Pt is due for appt. Pt will call to schedule.  Dietary issues and exercise activities discussed:     Goals Addressed             This Visit's Progress    Patient Stated       Stay healthy and  exercise more.       Depression Screen    03/25/2023    2:03 PM 05/01/2022   10:34 AM 10/29/2021   10:37 AM 03/14/2021   12:33 PM 07/12/2020   11:01 AM 12/01/2019   10:38 AM  PHQ 2/9 Scores  PHQ - 2 Score 0 5 2 3 6 6   PHQ- 9 Score  16 10 14 15 6     Fall Risk    03/25/2023    2:12 PM 04/10/2022    2:29 PM 10/29/2021   10:34 AM 04/04/2021    2:53 PM 06/26/2020    2:39 PM  Fall Risk   Falls in the past year? 1 1 1 1 1   Number falls in past yr: 1 1 1  0 1  Injury with Fall? 0 1 0 1 0  Risk for fall due to : Impaired mobility;Impaired balance/gait  History of fall(s);Impaired balance/gait;Orthopedic patient;Medication side effect    Follow up Falls prevention discussed;Falls evaluation completed;Education provided  Education provided;Falls prevention discussed      MEDICARE RISK AT HOME:  Medicare Risk at Home - 03/25/23 1414     Any stairs in or around the home? Yes    If so, are there any without handrails? No    Home free of loose throw rugs in walkways, pet beds, electrical cords, etc? Yes    Adequate lighting in your home to reduce risk of falls? Yes    Life alert? No    Use of a cane, walker or w/c? No    Grab bars in the bathroom? No    Shower chair or bench in shower? No    Elevated toilet seat or a handicapped toilet? Yes             TIMED UP AND GO:  Was the test performed?  No    Cognitive Function:     12/01/2019   10:40 AM  MMSE - Mini Mental State Exam  Orientation to time 5  Orientation to Place 5  Registration 3  Attention/ Calculation 5  Recall 3  Language- repeat 1        03/25/2023    2:14 PM  6CIT Screen  What Year? 0 points  What month? 0 points  What time? 0 points  Count back from 20 0 points  Months in reverse 0 points  Repeat phrase 0 points  Total Score 0 points    Immunizations Immunization History  Administered Date(s) Administered   Influenza Split 08/19/2012   Influenza,inj,Quad PF,6+ Mos 05/04/2015, 09/04/2018, 04/07/2019, 08/14/2020, 05/01/2022   PFIZER(Purple Top)SARS-COV-2 Vaccination 10/31/2019, 11/21/2019   Pneumococcal Conjugate-13 05/04/2015   Tdap 08/28/2011   Zoster Recombinant(Shingrix) 04/13/2020    TDAP status: Due, Education has been provided  regarding the importance of this vaccine. Advised may receive this vaccine at local pharmacy or Health Dept. Aware to provide a copy of the vaccination record if obtained from local pharmacy or Health Dept. Verbalized acceptance and understanding.  Flu Vaccine status: Due, Education has been provided regarding the importance of this vaccine. Advised may receive this vaccine at local pharmacy or Health Dept. Aware to provide a copy of the vaccination record if obtained from local pharmacy or Health Dept. Verbalized acceptance and understanding.  Pneumococcal vaccine status: Due, Education has been provided regarding the importance of this vaccine. Advised may receive this vaccine at local pharmacy or Health Dept. Aware to provide a copy of the vaccination record if obtained from local pharmacy or Health Dept. Verbalized acceptance and understanding.  Covid-19 vaccine status: Information provided on how to obtain vaccines.   Qualifies for Shingles Vaccine? Yes   Zostavax completed No   Shingrix Completed?: No.    Education has been provided regarding the importance of this vaccine. Patient has been  advised to call insurance company to determine out of pocket expense if they have not yet received this vaccine. Advised may also receive vaccine at local pharmacy or Health Dept. Verbalized acceptance and understanding.  Screening Tests Health Maintenance  Topic Date Due   Zoster Vaccines- Shingrix (2 of 2) 06/08/2020   DTaP/Tdap/Td (2 - Td or Tdap) 08/27/2021   PAP SMEAR-Modifier  04/04/2022   MAMMOGRAM  07/18/2022   Fecal DNA (Cologuard)  03/15/2023   INFLUENZA VACCINE  03/12/2023   Medicare Annual Wellness (AWV)  03/24/2024   Hepatitis C Screening  Completed   HIV Screening  Completed   HPV VACCINES  Aged Out   COVID-19 Vaccine  Discontinued    Health Maintenance  Health Maintenance Due  Topic Date Due   Zoster Vaccines- Shingrix (2 of 2) 06/08/2020   DTaP/Tdap/Td (2 - Td or Tdap) 08/27/2021   PAP SMEAR-Modifier  04/04/2022   MAMMOGRAM  07/18/2022   Fecal DNA (Cologuard)  03/15/2023   INFLUENZA VACCINE  03/12/2023    Colorectal cancer screening: Type of screening: Cologuard. Completed 03/14/20. Repeat every 3 years. Order placed for cologuard.  Mammogram status: Ordered 03/25/23. Pt provided with contact info and advised to call to schedule appt.    Lung Cancer Screening: (Low Dose CT Chest recommended if Age 6-80 years, 20 pack-year currently smoking OR have quit w/in 15years.) does not qualify.   Lung Cancer Screening Referral: under Pulmonologist Care.  Additional Screening:  Hepatitis C Screening: does qualify; Completed 01/31/20  Vision Screening: Recommended annual ophthalmology exams for early detection of glaucoma and other disorders of the eye. Is the patient up to date with their annual eye exam?  Yes , pt is due and stated she will call for appointment. Who is the provider or what is the name of the office in which the patient attends annual eye exams? [facility] If pt is not established with a provider, would they like to be referred to a provider to  establish care? Yes .   Dental Screening: Recommended annual dental exams for proper oral hygiene    Community Resource Referral / Chronic Care Management: CRR required this visit?  No   CCM required this visit?  No     Plan:     I have personally reviewed and noted the following in the patient's chart:   Medical and social history Use of alcohol, tobacco or illicit drugs  Current medications and supplements including opioid prescriptions.  Patient is not currently taking opioid prescriptions. Functional ability and status Nutritional status Physical activity Advanced directives List of other physicians Hospitalizations, surgeries, and ER visits in previous 12 months Vitals Screenings to include cognitive, depression, and falls Referrals and appointments  In addition, I have reviewed and discussed with patient certain preventive protocols, quality metrics, and best practice recommendations. A written personalized care plan for preventive services as well as general preventive health recommendations were provided to patient.     Maryan Puls, LPN   1/61/0960   After Visit Summary: (MyChart) Due to this being a telephonic visit, the after visit summary with patients personalized plan was offered to patient via MyChart   Nurse Notes: Pt stated that she will go to the pharmacy to get immunizations that are due and provide documentation to PCP. Order placed for mammogram and Cologuard. Pt stated she will call OB/GYN to schedule pap smear.

## 2023-03-26 ENCOUNTER — Encounter (INDEPENDENT_AMBULATORY_CARE_PROVIDER_SITE_OTHER): Payer: Self-pay

## 2023-04-10 NOTE — Progress Notes (Unsigned)
Assessment/Plan:   1.  Essential Tremor, exacerbated by GAD             -continue primidone, 50 mg bid.  Discussed that we could increase it prn but she wanted to hold for now.             -Continue propranolol, 40 mg.  Did not want to increase this because of history of depression 2.  Chronic migraine/chronic daily headache  -okay right now 3.  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 [name] 4.  Hypertension             -Patient on 2 antihypertensives.  This is in addition to the propranolol I have her on for tremor.  BP looks okay today 5.  Cough  -Following with pulmonary.  They feel that some is due to reflux.  They are also treating her gabapentin for "neurogenic cough" but she has seen ENT for cough as well  6.  Falls  -due to chronic ankle issue on the R per patient.    -needs to use walker 7.  Myoclonus  -she reports told can take 300 mg qid and 1200 at bedtime but she is actually doing 300 mg bid and 600 q hs.  May be cause of the mild myoclonus and she and I discussed this today. Subjective:   Jamie Patterson was seen today in follow up for essential tremor.  My previous records were reviewed prior to todays visit.  She remains on primidone and propranolol.  Tremor is about the same. Tremor is not interfering with ADL's but she still cannot type, write.  She has been under a lot of stress - she found her brother dead at the end of 2022/12/22.  He was a high functioning alcoholic.  She wonders if he didn't have an MI.  Pt reports 3 falls since Jan - she attributes to chronic bad ankle.  With one, she was at a procedural hospital with her husband and fell.  She doesn't feel dizzy/lightheaded with falls.   Last seen by primary care in December and that was an acute visit.  Notes from her nurse practitioner from September 2023 from wellness visit indicate that  we are treating her for seizure disorder, which is not factual.  Her primidone is for tremor and her Lamictal is for mood disorder, not prescribed by this office.  Current prescribed movement disorder medications: Primidone, 50 mg twice daily  Propranolol, 40 mg daily (She is already on gabapentin, 300/300/600 for cough)   ALLERGIES:   Allergies  Allergen Reactions   Amlodipine Swelling    Ankle edema    CURRENT MEDICATIONS:  Outpatient Encounter Medications as of 04/14/2023  Medication Sig   acetaminophen (TYLENOL) 325 MG tablet Take 2 tablets (650 mg total) by mouth every 6 (six) hours as needed for mild pain (or Fever >/= 101).   albuterol (VENTOLIN HFA) 108 (90 Base) MCG/ACT inhaler Inhale 2 puffs into the lungs every 6 (six) hours as needed for wheezing or shortness of breath.   Ascorbic Acid (VITAMIN C) 1000 MG tablet Take 1,000 mg by mouth daily.   cholecalciferol (VITAMIN D3) 25 MCG (1000 UNIT) tablet Take 1,000 Units by mouth daily.   cycloSPORINE (RESTASIS) 0.05 % ophthalmic emulsion Place 1  drop into both eyes 2 (two) times daily.   DULoxetine (CYMBALTA) 60 MG capsule Take 60 mg by mouth 2 (two) times daily.    escitalopram (LEXAPRO) 10 MG tablet Take 10 mg by mouth daily.    ferrous sulfate 325 (65 FE) MG tablet Take 325 mg by mouth daily with breakfast.   gabapentin (NEURONTIN) 300 MG capsule Take 300-1,200 mg by mouth See admin instructions. Take 1 capsule four times a day, then take 4 capsules at bedtime   lamoTRIgine (LAMICTAL) 150 MG tablet TAKE 1 TABLET BY MOUTH TWICE A DAY (Patient taking differently: Take 150 mg by mouth 2 (two) times daily.)   levothyroxine (SYNTHROID) 50 MCG tablet TAKE ONE TABLET BY MOUTH ONCE A DAY. TAKE ON AN EMPTY STOMACH WITH A GLASS OF WATER ATLEAST 30-60 MIN BEFORE BREAKFAST   MAGNESIUM PO Take 400 mg by mouth 3 (three) times daily.    Melatonin 10 MG TABS Take 2 tablets by mouth at bedtime.    Multiple Vitamin (MULTIVITAMIN) tablet Take 1  tablet by mouth daily.   naproxen (NAPROSYN) 500 MG tablet Take 1 tablet (500 mg total) by mouth 2 (two) times daily.   olmesartan (BENICAR) 20 MG tablet TAKE ONE TABLET BY MOUTH ONCE A DAY FOR BLOOD PRESSURE   rosuvastatin (CRESTOR) 20 MG tablet TAKE ONE TABLET BY MOUTH ONCE A DAY FOR CHOLESTEROL   tiZANidine (ZANAFLEX) 4 MG tablet Take 8 mg by mouth at bedtime.   vitamin B-12 (CYANOCOBALAMIN) 1000 MCG tablet Take 1,000 mcg by mouth daily.   [DISCONTINUED] primidone (MYSOLINE) 50 MG tablet TAKE ONE TABLET BY MOUTH EVERY MORNING AND AT BEDTIME   [DISCONTINUED] propranolol (INDERAL) 40 MG tablet TAKE ONE TABLET BY MOUTH ONCE A DAY   primidone (MYSOLINE) 50 MG tablet Take 1 tablet (50 mg total) by mouth 2 (two) times daily.   propranolol (INDERAL) 40 MG tablet Take 1 tablet (40 mg total) by mouth daily.   [DISCONTINUED] amoxicillin-clavulanate (AUGMENTIN) 875-125 MG tablet Take 1 tablet by mouth 2 (two) times daily. (Patient not taking: Reported on 03/25/2023)   [DISCONTINUED] azithromycin (ZITHROMAX) 250 MG tablet Take 2 tablets by mouth today, then 1 tablet daily for 4 additional days. (Patient not taking: Reported on 03/25/2023)   [DISCONTINUED] chlorpheniramine-HYDROcodone (TUSSIONEX) 10-8 MG/5ML Take 5 mLs by mouth every 12 (twelve) hours as needed for cough. (Patient not taking: Reported on 03/25/2023)   No facility-administered encounter medications on file as of 04/14/2023.     Objective:    PHYSICAL EXAMINATION:    VITALS:   Vitals:   04/14/23 1446  BP: 136/84  Pulse: 94  SpO2: 96%  Weight: 210 lb (95.3 kg)  Height: 5\' 5"  (1.651 m)   GEN:  The patient appears stated age and is in NAD. HEENT:  Normocephalic, atraumatic  Neurological examination:  Orientation: The patient is alert and oriented x3. Cranial nerves: There is good facial symmetry. The speech is fluent and clear. Soft palate rises symmetrically and there is no tongue deviation. Hearing is intact to conversational  tone. Sensation: Sensation is intact to light touch throughout Motor: Strength is at least antigravity x4.  Movement examination: Tone: There is normal tone in the UE/LE Abnormal movements: no rest tremor.  No significant postural tremor.  Mild trouble with Archimedes spirals on the left, but she has some mild myoclonus on the left as well.  Coordination:  There is no decremation with RAM's, with any form of RAMS, including alternating supination and pronation of the forearm,  hand opening and closing, finger taps, heel taps and toe taps. Gait and Station: The patient has no difficulty arising.  Gait is antalgic and a bit wide-based. I have reviewed and interpreted the following labs independently   Chemistry      Component Value Date/Time   NA 136 05/01/2022 1123   K 4.1 05/01/2022 1123   CL 101 05/01/2022 1123   CO2 27 05/01/2022 1123   BUN 10 05/01/2022 1123   CREATININE 0.73 05/01/2022 1123   CREATININE 0.67 02/16/2018 1332      Component Value Date/Time   CALCIUM 9.7 05/01/2022 1123   ALKPHOS 75 03/05/2022 0952   AST 18 03/05/2022 0952   ALT 15 03/05/2022 0952   BILITOT 0.5 03/05/2022 0952      Lab Results  Component Value Date   WBC 8.3 03/05/2022   HGB 12.8 03/05/2022   HCT 37.6 03/05/2022   MCV 97.9 03/05/2022   PLT 256 03/05/2022   Lab Results  Component Value Date   TSH 3.13 05/01/2022     Chemistry      Component Value Date/Time   NA 136 05/01/2022 1123   K 4.1 05/01/2022 1123   CL 101 05/01/2022 1123   CO2 27 05/01/2022 1123   BUN 10 05/01/2022 1123   CREATININE 0.73 05/01/2022 1123   CREATININE 0.67 02/16/2018 1332      Component Value Date/Time   CALCIUM 9.7 05/01/2022 1123   ALKPHOS 75 03/05/2022 0952   AST 18 03/05/2022 0952   ALT 15 03/05/2022 0952   BILITOT 0.5 03/05/2022 0952      Total time spent on today's visit was 30 minutes, including both face-to-face time and nonface-to-face time.  Time included that spent on review of records  (prior notes available to me/labs/imaging if pertinent), discussing treatment and goals, answering patient's questions and coordinating care.   Cc:  Doreene Nest, NP

## 2023-04-14 ENCOUNTER — Encounter: Payer: Self-pay | Admitting: Neurology

## 2023-04-14 ENCOUNTER — Ambulatory Visit: Payer: Medicare PPO | Admitting: Neurology

## 2023-04-14 VITALS — BP 136/84 | HR 94 | Ht 65.0 in | Wt 210.0 lb

## 2023-04-14 DIAGNOSIS — G250 Essential tremor: Secondary | ICD-10-CM

## 2023-04-14 DIAGNOSIS — G253 Myoclonus: Secondary | ICD-10-CM

## 2023-04-14 DIAGNOSIS — R251 Tremor, unspecified: Secondary | ICD-10-CM

## 2023-04-14 MED ORDER — PROPRANOLOL HCL 40 MG PO TABS: 40.0000 mg | ORAL_TABLET | Freq: Every day | ORAL | 3 refills | Status: DC

## 2023-04-14 MED ORDER — PRIMIDONE 50 MG PO TABS: 50.0000 mg | ORAL_TABLET | Freq: Two times a day (BID) | ORAL | 3 refills | Status: DC

## 2023-05-06 ENCOUNTER — Encounter: Payer: Self-pay | Admitting: Primary Care

## 2023-05-06 ENCOUNTER — Ambulatory Visit: Payer: Medicare PPO | Admitting: Primary Care

## 2023-05-06 VITALS — BP 130/74 | HR 82 | Temp 97.0°F | Ht 65.0 in | Wt 206.0 lb

## 2023-05-06 DIAGNOSIS — E038 Other specified hypothyroidism: Secondary | ICD-10-CM

## 2023-05-06 DIAGNOSIS — K5900 Constipation, unspecified: Secondary | ICD-10-CM

## 2023-05-06 DIAGNOSIS — E7800 Pure hypercholesterolemia, unspecified: Secondary | ICD-10-CM

## 2023-05-06 DIAGNOSIS — G40909 Epilepsy, unspecified, not intractable, without status epilepticus: Secondary | ICD-10-CM

## 2023-05-06 DIAGNOSIS — M545 Low back pain, unspecified: Secondary | ICD-10-CM | POA: Diagnosis not present

## 2023-05-06 DIAGNOSIS — R053 Chronic cough: Secondary | ICD-10-CM | POA: Diagnosis not present

## 2023-05-06 DIAGNOSIS — R251 Tremor, unspecified: Secondary | ICD-10-CM

## 2023-05-06 DIAGNOSIS — R7303 Prediabetes: Secondary | ICD-10-CM | POA: Diagnosis not present

## 2023-05-06 DIAGNOSIS — D509 Iron deficiency anemia, unspecified: Secondary | ICD-10-CM

## 2023-05-06 DIAGNOSIS — Z0000 Encounter for general adult medical examination without abnormal findings: Secondary | ICD-10-CM | POA: Diagnosis not present

## 2023-05-06 DIAGNOSIS — Z23 Encounter for immunization: Secondary | ICD-10-CM | POA: Diagnosis not present

## 2023-05-06 DIAGNOSIS — I10 Essential (primary) hypertension: Secondary | ICD-10-CM

## 2023-05-06 DIAGNOSIS — F331 Major depressive disorder, recurrent, moderate: Secondary | ICD-10-CM | POA: Diagnosis not present

## 2023-05-06 DIAGNOSIS — G8929 Other chronic pain: Secondary | ICD-10-CM

## 2023-05-06 DIAGNOSIS — E278 Other specified disorders of adrenal gland: Secondary | ICD-10-CM

## 2023-05-06 NOTE — Assessment & Plan Note (Signed)
Following with neurology. Reviewed offices notes from September 2024. No seizure since 2013.  Factual update: patient on Lamictal 150 mg BID for mood disorder.

## 2023-05-06 NOTE — Assessment & Plan Note (Signed)
Repeat A1c pending

## 2023-05-06 NOTE — Assessment & Plan Note (Signed)
Controlled.  Continue magnesium and probiotics.

## 2023-05-06 NOTE — Assessment & Plan Note (Addendum)
She will restart Shingrix vaccines at the pharmacy.  Influenza vaccine provided today. Pap smear UTD.  Follows with GYN Mammogram due, she is aware and will get this at her GYN office Colon cancer screening overdue, she will complete Cologuard  Discussed the importance of a healthy diet and regular exercise in order for weight loss, and to reduce the risk of further co-morbidity.  Exam stable. Labs pending.  Follow up in 1 year for repeat physical.

## 2023-05-06 NOTE — Assessment & Plan Note (Signed)
Repeat lipid panel pending. ? ?Continue rosuvastatin 20 mg daily. ?

## 2023-05-06 NOTE — Patient Instructions (Signed)
Stop by the lab prior to leaving today. I will notify you of your results once received.   You will be contacted regarding your CT scan.  Complete your mammogram and the Cologuard colon cancer screening kit.  It was a pleasure to see you today!

## 2023-05-06 NOTE — Assessment & Plan Note (Signed)
Chronic.  Continue tizanidine 8 mg HS, gabapentin 300 mg BID and 600 mg HS which is prescribed by her psychiatrist.

## 2023-05-06 NOTE — Assessment & Plan Note (Signed)
Overall controlled. Following with psychiatry.  Continue Cymbalta 60 mg twice daily, Lexapro 10 mg daily, Lamictal 150 mg twice daily, gabapentin 300 mg BID and 600 mg HS.  Condolences provided regarding her brother.

## 2023-05-06 NOTE — Assessment & Plan Note (Signed)
Chronic, somewhat better.  Failed multiple regimens. Evaluated by ENT and pulmonology.  Continue gabapentin 300 mg BID and 600 mg HS. Continue sleeping with head elevated.

## 2023-05-06 NOTE — Assessment & Plan Note (Signed)
Continue ferrous sulfate 325 mg every other day. Repeat iron studies pending.

## 2023-05-06 NOTE — Assessment & Plan Note (Signed)
Controlled.  Continue olmesartan 20 mg daily. CMP pending.

## 2023-05-06 NOTE — Assessment & Plan Note (Signed)
Due for CT adrenal washout study.  She agrees. Orders placed.

## 2023-05-06 NOTE — Assessment & Plan Note (Signed)
Following neurology, office notes reviewed from September 2024.  Continue primidone 50 mg twice daily and propranolol 40 mg daily.

## 2023-05-06 NOTE — Assessment & Plan Note (Signed)
She is taking levothyroxine incorrectly.  Discussed to separate iron and magnesium 4 hours from levothyroxine. Repeat TSH pending.

## 2023-05-06 NOTE — Progress Notes (Signed)
Subjective:    Patient ID: Jamie Patterson, female    DOB: [birth year], 62 y.o.   MRN: [id number]  HPI  [name] is a very pleasant 62 y.o. female who presents today for complete physical and follow up of chronic conditions.  Immunizations: -Tetanus: Completed in 2013 -Influenza: Completed today -Shingles: Completed 1 Shingrix vaccine -Pneumonia: Completed in 2016  Diet: Fair diet.  Exercise: No regular exercise.  Eye exam: Completes annually  Dental exam: Completes semi-annually    Pap Smear: UTD, follows with GYN Mammogram: Completed in 2023 completed at GYN office Bone Density Scan: Completed in 2024  Colonoscopy: Completed Cologuard in 2021, due. She has the kit at home and plans to complete.   BP Readings from Last 3 Encounters:  05/06/23 130/74  04/14/23 136/84  02/06/23 (!) 126/91       Review of Systems  Constitutional:  Negative for unexpected weight change.  HENT:  Negative for rhinorrhea.   Respiratory:  Negative for cough and shortness of breath.   Cardiovascular:  Negative for chest pain.  Gastrointestinal:  Negative for constipation and diarrhea.  Genitourinary:  Negative for difficulty urinating and menstrual problem.  Musculoskeletal:  Positive for arthralgias and back pain.  Skin:  Negative for rash.  Allergic/Immunologic: Negative for environmental allergies.  Neurological:  Negative for dizziness and headaches.  Psychiatric/Behavioral:  The patient is not nervous/anxious.          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   Trimalleolar fracture on right s/p fusion     Benign essential tremor    worsened by anxiety   Chronic pain due to injury    at R ankle due to ankle fracture   Depression    followed by psych   GERD (gastroesophageal reflux disease)    Hemoptysis 03/03/2022   History of hypertension    HLD (hyperlipidemia)    Imbalance    likely multifactorial (s/p balance training  at [facility] 08/2010) ?CMT   Influenza A 09/15/2018   Iron deficiency anemia    LLL pneumonia 10/05/2018   Right ankle pain 2003   trimalleolar fx s/p fusion   Seizure (HCC)    focal, ex vacuo ventriculomegaly?, pending neuro w/u with sleep deprived EEG   Syncope and collapse 08/19/2011    Social History   Socioeconomic History   Marital status: Married    Spouse name: Not on file   Number of children: 1   Years of education: Not on file   Highest education level: Not on file  Occupational History   Occupation: Disabled    Comment: depression   Occupation: DISABILITY    Employer: UNEMPLOYED  Tobacco Use   Smoking status: Former    Current packs/day: 0.00    Average packs/day: 1 pack/day for 20.0 years (20.0 ttl pk-yrs)    Types: Cigarettes    Start date: 08/12/1975    Quit date: 08/12/1995    Years since quitting: 27.7   Smokeless tobacco: Never  Vaping Use   Vaping status: Never Used  Substance and Sexual Activity   Alcohol use: No    Alcohol/week: 0.0 standard drinks of alcohol    Comment: occassinal   Drug use: No   Sexual activity: Not on file  Other Topics Concern   Not on file  Social History Narrative   No caffeine. Divorced. 1 child. 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 but  hasn't needed it recently, although endorses that she falls a lot    Social Determinants of Health   Financial Resource Strain: Low Risk  (03/25/2023)   Overall Financial Resource Strain (CARDIA)    Difficulty of Paying Living Expenses: Not hard at all  Food Insecurity: No Food Insecurity (03/25/2023)   Hunger Vital Sign    Worried About Running Out of Food in the Last Year: Never true    Ran Out of Food in the Last Year: Never true  Transportation Needs: No Transportation Needs (03/25/2023)   PRAPARE - Administrator, Civil Service (Medical): No    Lack of Transportation (Non-Medical): No  Physical Activity: Inactive (03/25/2023)   Exercise  Vital Sign    Days of Exercise per Week: 0 days    Minutes of Exercise per Session: 0 min  Stress: No Stress Concern Present (03/25/2023)   [employer] of Occupational Health - Occupational Stress Questionnaire    Feeling of Stress : Not at all  Social Connections: Moderately Isolated (03/25/2023)   Social Connection and Isolation Panel [NHANES]    Frequency of Communication with Friends and Family: More than three times a week    Frequency of Social Gatherings with Friends and Family: More than three times a week    Attends Religious Services: Never    Database administrator or Organizations: No    Attends Banker Meetings: Never    Marital Status: Married  Catering manager Violence: Not At Risk (03/25/2023)   Humiliation, Afraid, Rape, and Kick questionnaire    Fear of Current or Ex-Partner: No    Emotionally Abused: No    Physically Abused: No    Sexually Abused: No    Past Surgical History:  Procedure Laterality Date   ANKLE FUSION  02/2004   Right-50% disability    BRONCHIAL WASHINGS  03/04/2022   Procedure: BRONCHIAL WASHINGS;  Surgeon: Kalman Shan, MD;  Location: [facility];  Service: Endoscopy;;   CARPAL TUNNEL RELEASE  12/2017   L CTR repair, R thumb A1 pulley release (Gramig)   CESAREAN SECTION     HAMMER TOE SURGERY  09/12/2019   hospitalization  08/2011   syncope thought 2/2 seizure, MRI - Hyperintensity in the cerebral white matter bilaterally is nonspecific, started on Keppra   ORIF ANKLE FRACTURE  06/2003   Right   VIDEO BRONCHOSCOPY Bilateral 03/04/2022   Procedure: VIDEO BRONCHOSCOPY WITHOUT FLUORO;  Surgeon: Kalman Shan, MD;  Location: [facility];  Service: Endoscopy;  Laterality: Bilateral;    Family History  Problem Relation Age of Onset   Depression Mother    Arrhythmia Mother 46       Dx with VTach after having sudden collapse, did NOT have h/o AMI's    Atrial fibrillation Father    Hypertension Father    Osteoarthritis  Father    Aortic aneurysm Father    Hepatitis Brother        Hep. C   Anemia Sister     Allergies  Allergen Reactions   Amlodipine Swelling    Ankle edema    Current Outpatient Medications on File Prior to Visit  Medication Sig Dispense Refill   acetaminophen (TYLENOL) 325 MG tablet Take 2 tablets (650 mg total) by mouth every 6 (six) hours as needed for mild pain (or Fever >/= 101). 20 tablet 0   albuterol (VENTOLIN HFA) 108 (90 Base) MCG/ACT inhaler Inhale 2 puffs into the lungs every 6 (six) hours as needed for  wheezing or shortness of breath. 8 each 0   Ascorbic Acid (VITAMIN C) 1000 MG tablet Take 1,000 mg by mouth daily.     cholecalciferol (VITAMIN D3) 25 MCG (1000 UNIT) tablet Take 1,000 Units by mouth daily.     cycloSPORINE (RESTASIS) 0.05 % ophthalmic emulsion Place 1 drop into both eyes 2 (two) times daily.     DULoxetine (CYMBALTA) 60 MG capsule Take 60 mg by mouth 2 (two) times daily.      escitalopram (LEXAPRO) 10 MG tablet Take 10 mg by mouth daily.      ferrous sulfate 325 (65 FE) MG tablet Take 325 mg by mouth daily with breakfast.     gabapentin (NEURONTIN) 300 MG capsule Take 300-1,200 mg by mouth See admin instructions. Take 1 capsule four times a day, then take 4 capsules at bedtime     lamoTRIgine (LAMICTAL) 150 MG tablet TAKE 1 TABLET BY MOUTH TWICE A DAY (Patient taking differently: Take 150 mg by mouth 2 (two) times daily.) 180 tablet 1   levothyroxine (SYNTHROID) 50 MCG tablet TAKE ONE TABLET BY MOUTH ONCE A DAY. TAKE ON AN EMPTY STOMACH WITH A GLASS OF WATER ATLEAST 30-60 MIN BEFORE BREAKFAST 90 tablet 0   MAGNESIUM PO Take 400 mg by mouth 3 (three) times daily.      Melatonin 10 MG TABS Take 2 tablets by mouth at bedtime.      Multiple Vitamin (MULTIVITAMIN) tablet Take 1 tablet by mouth daily.     olmesartan (BENICAR) 20 MG tablet TAKE ONE TABLET BY MOUTH ONCE A DAY FOR BLOOD PRESSURE 90 tablet 0   primidone (MYSOLINE) 50 MG tablet Take 1 tablet (50 mg  total) by mouth 2 (two) times daily. 180 tablet 3   propranolol (INDERAL) 40 MG tablet Take 1 tablet (40 mg total) by mouth daily. 90 tablet 3   rosuvastatin (CRESTOR) 20 MG tablet TAKE ONE TABLET BY MOUTH ONCE A DAY FOR CHOLESTEROL 90 tablet 0   tiZANidine (ZANAFLEX) 4 MG tablet Take 8 mg by mouth at bedtime.     vitamin B-12 (CYANOCOBALAMIN) 1000 MCG tablet Take 1,000 mcg by mouth daily.     naproxen (NAPROSYN) 500 MG tablet Take 1 tablet (500 mg total) by mouth 2 (two) times daily. (Patient not taking: Reported on 05/06/2023) 30 tablet 0   No current facility-administered medications on file prior to visit.    BP 130/74   Pulse 82   Temp (!) 97 F (36.1 C) (Temporal)   Ht 5\' 5"  (1.651 m)   Wt 206 lb (93.4 kg)   LMP 06/01/2013   SpO2 95%   BMI 34.28 kg/m  Objective:   Physical Exam HENT:     Right Ear: Tympanic membrane and ear canal normal.     Left Ear: Tympanic membrane and ear canal normal.     Nose: Nose normal.  Eyes:     Conjunctiva/sclera: Conjunctivae normal.     Pupils: Pupils are equal, round, and reactive to light.  Neck:     Thyroid: No thyromegaly.  Cardiovascular:     Rate and Rhythm: Normal rate and regular rhythm.     Heart sounds: No murmur heard. Pulmonary:     Effort: Pulmonary effort is normal.     Breath sounds: Normal breath sounds. No rales.  Abdominal:     General: Bowel sounds are normal.     Palpations: Abdomen is soft.     Tenderness: There is no abdominal tenderness.  Musculoskeletal:  General: Normal range of motion.     Cervical back: Neck supple.  Lymphadenopathy:     Cervical: No cervical adenopathy.  Skin:    General: Skin is warm and dry.     Findings: No rash.  Neurological:     Mental Status: She is alert and oriented to person, place, and time.     Cranial Nerves: No cranial nerve deficit.     Deep Tendon Reflexes: Reflexes are normal and symmetric.  Psychiatric:        Mood and Affect: Mood normal.            Assessment & Plan:  Preventative health care Assessment & Plan: She will restart Shingrix vaccines at the pharmacy.  Influenza vaccine provided today. Pap smear UTD.  Follows with GYN Mammogram due, she is aware and will get this at her GYN office Colon cancer screening overdue, she will complete Cologuard  Discussed the importance of a healthy diet and regular exercise in order for weight loss, and to reduce the risk of further co-morbidity.  Exam stable. Labs pending.  Follow up in 1 year for repeat physical.    Tremor Assessment & Plan: Following neurology, office notes reviewed from September 2024.  Continue primidone 50 mg twice daily and propranolol 40 mg daily.   MDD (major depressive disorder), recurrent episode, moderate (HCC) Assessment & Plan: Overall controlled. Following with psychiatry.  Continue Cymbalta 60 mg twice daily, Lexapro 10 mg daily, Lamictal 150 mg twice daily, gabapentin 300 mg BID and 600 mg HS.  Condolences provided regarding her brother.   Seizure disorder [facility]) Assessment & Plan: Following with neurology. Reviewed offices notes from September 2024. No seizure since 2013.  Factual update: patient on Lamictal 150 mg BID for mood disorder.     Chronic bilateral low back pain, unspecified whether sciatica present Assessment & Plan: Chronic.  Continue tizanidine 8 mg HS, gabapentin 300 mg BID and 600 mg HS which is prescribed by her psychiatrist.     Chronic cough Assessment & Plan: Chronic, somewhat better.  Failed multiple regimens. Evaluated by ENT and pulmonology.  Continue gabapentin 300 mg BID and 600 mg HS. Continue sleeping with head elevated.   Other specified hypothyroidism Assessment & Plan: She is taking levothyroxine incorrectly.  Discussed to separate iron and magnesium 4 hours from levothyroxine. Repeat TSH pending.   Orders: -     TSH  Constipation, unspecified constipation type Assessment &  Plan: Controlled.  Continue magnesium and probiotics.   HYPERCHOLESTEROLEMIA Assessment & Plan: Repeat lipid panel pending. Continue rosuvastatin 20 mg daily.  Orders: -     Lipid panel  Iron deficiency anemia, unspecified iron deficiency anemia type Assessment & Plan: Continue ferrous sulfate 325 mg every other day. Repeat iron studies pending.  Orders: -     CBC -     IBC + Ferritin  Prediabetes Assessment & Plan: Repeat A1c pending.  Orders: -     Hemoglobin A1c  Right adrenal mass [facility]) Assessment & Plan: Due for CT adrenal washout study.  She agrees. Orders placed.  Orders: -     CT ADRENAL ABDOMEN WO CONTRAST; Future  Essential hypertension Assessment & Plan: Controlled.  Continue olmesartan 20 mg daily. CMP pending.  Orders: -     Comprehensive metabolic panel  Encounter for immunization -     Flu vaccine trivalent PF, 6mos and older(Flulaval,Afluria,Fluarix,Fluzone)        Doreene Nest, NP

## 2023-05-07 ENCOUNTER — Encounter: Payer: Self-pay | Admitting: *Deleted

## 2023-05-07 LAB — COMPREHENSIVE METABOLIC PANEL
ALT: 14 U/L (ref 0–35)
AST: 19 U/L (ref 0–37)
Albumin: 4.3 g/dL (ref 3.5–5.2)
Alkaline Phosphatase: 118 U/L — ABNORMAL HIGH (ref 39–117)
BUN: 11 mg/dL (ref 6–23)
CO2: 26 mEq/L (ref 19–32)
Calcium: 9.8 mg/dL (ref 8.4–10.5)
Chloride: 100 mEq/L (ref 96–112)
Creatinine, Ser: 0.73 mg/dL (ref 0.40–1.20)
GFR: 88.78 mL/min (ref >60.00)
Glucose, Bld: 92 mg/dL (ref 70–99)
Potassium: 4.1 mEq/L (ref 3.5–5.1)
Sodium: 137 mEq/L (ref 135–145)
Total Bilirubin: 0.4 mg/dL (ref 0.2–1.2)
Total Protein: 7.2 g/dL (ref 6.0–8.3)

## 2023-05-07 LAB — CBC
HCT: 43.4 % (ref 36.0–46.0)
Hemoglobin: 14.3 g/dL (ref 12.0–15.0)
MCHC: 32.9 g/dL (ref 30.0–36.0)
MCV: 98.7 fl (ref 78.0–100.0)
Platelets: 327 10*3/uL (ref 150.0–400.0)
RBC: 4.39 Mil/uL (ref 3.87–5.11)
RDW: 13.1 % (ref 11.5–15.5)
WBC: 7.1 10*3/uL (ref 4.0–10.5)

## 2023-05-07 LAB — IBC + FERRITIN
Ferritin: 135.4 ng/mL (ref 10.0–291.0)
Iron: 69 ug/dL (ref 42–145)
Saturation Ratios: 23.7 % (ref 20.0–50.0)
TIBC: 291.2 ug/dL (ref 250.0–450.0)
Transferrin: 208 mg/dL — ABNORMAL LOW (ref 212.0–360.0)

## 2023-05-07 LAB — LIPID PANEL
Cholesterol: 194 mg/dL (ref 0–200)
HDL: 66.7 mg/dL (ref >39.00)
LDL Cholesterol: 101 mg/dL — ABNORMAL HIGH (ref 0–99)
NonHDL: 126.88
Total CHOL/HDL Ratio: 3
Triglycerides: 130 mg/dL (ref 0.0–149.0)
VLDL: 26 mg/dL (ref 0.0–40.0)

## 2023-05-07 LAB — HEMOGLOBIN A1C: Hgb A1c MFr Bld: 5.8 % (ref 4.6–6.5)

## 2023-05-07 LAB — TSH: TSH: 4.43 u[IU]/mL (ref 0.35–5.50)

## 2023-05-22 ENCOUNTER — Other Ambulatory Visit: Payer: Self-pay | Admitting: Primary Care

## 2023-05-22 DIAGNOSIS — E039 Hypothyroidism, unspecified: Secondary | ICD-10-CM

## 2023-05-22 DIAGNOSIS — E7800 Pure hypercholesterolemia, unspecified: Secondary | ICD-10-CM

## 2023-05-22 DIAGNOSIS — I10 Essential (primary) hypertension: Secondary | ICD-10-CM

## 2023-05-26 ENCOUNTER — Other Ambulatory Visit: Payer: Self-pay | Admitting: Primary Care

## 2023-05-26 DIAGNOSIS — H16223 Keratoconjunctivitis sicca, not specified as Sjogren's, bilateral: Secondary | ICD-10-CM | POA: Diagnosis not present

## 2023-05-26 DIAGNOSIS — R051 Acute cough: Secondary | ICD-10-CM

## 2023-05-26 DIAGNOSIS — H524 Presbyopia: Secondary | ICD-10-CM | POA: Diagnosis not present

## 2023-05-26 DIAGNOSIS — H04123 Dry eye syndrome of bilateral lacrimal glands: Secondary | ICD-10-CM | POA: Diagnosis not present

## 2023-05-26 DIAGNOSIS — H5213 Myopia, bilateral: Secondary | ICD-10-CM | POA: Diagnosis not present

## 2023-06-25 DIAGNOSIS — M25562 Pain in left knee: Secondary | ICD-10-CM | POA: Diagnosis not present

## 2023-06-25 DIAGNOSIS — S83411A Sprain of medial collateral ligament of right knee, initial encounter: Secondary | ICD-10-CM | POA: Diagnosis not present

## 2023-06-29 ENCOUNTER — Telehealth: Payer: Self-pay | Admitting: Primary Care

## 2023-06-29 NOTE — Telephone Encounter (Signed)
Patient is requesting a call back regarding blood pressure medications, can be reached at [phone]. FYI, I did transfer patient to access due to when she called, she advised me that her most recent blood pressure reading was 160/108.

## 2023-06-29 NOTE — Telephone Encounter (Signed)
Noted, will await her return call from our several messages.

## 2023-06-29 NOTE — Telephone Encounter (Signed)
Have not gotten access nurse note. Unable to reach pt by phone and left v/m requesting pt to cb.

## 2023-06-29 NOTE — Telephone Encounter (Signed)
FYI: This call has been transferred to Access Nurse. Once the result note has been entered staff can address the message at that time.  Patient called in with the following symptoms:  Red Word:elevated blood pressure, most recent reading was 160/108   Please advise at Mobile [phone] (mobile)  Message is routed to Provider Pool and [facility] Triage

## 2023-06-29 NOTE — Telephone Encounter (Signed)
Still unable to reach pt by phone.sending note to [facility] triage, Allayne Gitelman NP and [facility].

## 2023-06-29 NOTE — Telephone Encounter (Signed)
See other telephone encounter, patient was sent to access nurse. Will await triage notes.

## 2023-06-30 ENCOUNTER — Telehealth: Payer: Self-pay

## 2023-06-30 DIAGNOSIS — R232 Flushing: Secondary | ICD-10-CM | POA: Diagnosis not present

## 2023-06-30 DIAGNOSIS — I10 Essential (primary) hypertension: Secondary | ICD-10-CM | POA: Diagnosis not present

## 2023-06-30 NOTE — Telephone Encounter (Signed)
I spoke with pt; pt did not talk with access nurse on 06/29/23 because the person on the phone asked pt what her zip code was and what state pt lived in. Pt ended the call with access. pt found a list of meds that pt had typed possibly a year or more ago and pt wanted to know if she was taking hydrochlorothiazide or not. Hydrochlorothiazide is not on current med list and pt said she had not been taking that for a while. Pt wanted to know what meds on her list are for BP I advised pt olmesartan 20 mg taking daily and propranolol 40 mg taking daily. Pt said she has been taking those meds. Pt has not taken her BP meds this morning due to taking her thyroid med. Pt took her BP now 147/102 P 106 pt rested few mins recked BP 197/ something pt cut BP machine off and could not get BP reading back up. Pt has a wrist cuff and took BP with that BP 181/131. Pt has no H/A,dizziness,CP SOB or vision changes. Pt said for about 1 month pt has been having pain in both knees and left hip.and is being seen at [facility]. Pt is going to [facility] now for eval and pt scheduled appt with Allayne Gitelman NP on 07/02/23 at 8:40. Pt is taking her BP cuff with her to UC to compare readings.UC & ED precautions given to pt and pt voiced understanding. Sending note to Allayne Gitelman NP and [facility].

## 2023-06-30 NOTE — Patient Outreach (Signed)
Attempted to contact patient regarding MM, Colorectal exam. Left voicemail for patient to return my call at [phone].  Nicholes Rough, CMA Care Guide [facility]

## 2023-07-01 NOTE — Telephone Encounter (Signed)
Noted, will evaluate. 

## 2023-07-02 ENCOUNTER — Ambulatory Visit: Payer: Medicare PPO | Admitting: Primary Care

## 2023-07-02 ENCOUNTER — Encounter: Payer: Self-pay | Admitting: Primary Care

## 2023-07-02 VITALS — BP 150/110 | HR 82 | Temp 98.2°F | Ht 65.0 in | Wt 207.0 lb

## 2023-07-02 DIAGNOSIS — I10 Essential (primary) hypertension: Secondary | ICD-10-CM | POA: Diagnosis not present

## 2023-07-02 NOTE — Patient Instructions (Signed)
We increased your dose of olmesartan to 40 mg daily.  Start taking 2 of your 20 mg pills daily.  Please schedule a follow up visit to meet back with me in 2-3 weeks for blood pressure check.   Continue to monitor blood pressure at home.  It was a pleasure to see you today!

## 2023-07-02 NOTE — Progress Notes (Signed)
Subjective:    Patient ID: Jamie Patterson, female    DOB: [birth year], 61 y.o.   MRN: [id number]  Hypertension Pertinent negatives include no chest pain or headaches.    Jamie Patterson is a very pleasant 62 y.o. female with a significant medical history including hypertension, hypothyroidism, seizure disorder, hyperlipidemia, iron deficiency anemia, chronic cough who presents today for evaluation of hypertension.  Current medication list includes olmesartan 20 mg daily for hypertension treatment.  She is also managed on propranolol 40 mg daily for essential tremor per neurology. Previously managed on hydrochlorothiazide 25 mg daily, but this was discontinued by a hospitalist in July 2023 due to hyponatremia.  Previously managed on amlodipine 10 mg daily but this caused side effects of ankle edema.  She contacted our office two days ago with reports of recent elevated blood pressure readings of 147/102, 181/131, 197 systolic.  She is here today for evaluation.  She has not been taking hydrochlorothiazide in > 1 year. She denies headaches, dizziness.   BP Readings from Last 3 Encounters:  07/02/23 (!) 150/110  05/06/23 130/74  04/14/23 136/84     Review of Systems  Eyes:  Negative for visual disturbance.  Cardiovascular:  Negative for chest pain.  Neurological:  Negative for dizziness and headaches.         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   Trimalleolar fracture on right s/p fusion     Benign essential tremor    worsened by anxiety   Chronic pain due to injury    at R ankle due to ankle fracture   Depression    followed by psych   GERD (gastroesophageal reflux disease)    Hemoptysis 03/03/2022   History of hypertension    HLD (hyperlipidemia)    Imbalance    likely multifactorial (s/p balance training at [facility] 08/2010) ?CMT   Influenza A 09/15/2018   Iron deficiency anemia    LLL pneumonia 10/05/2018   Right ankle  pain 2003   trimalleolar fx s/p fusion   Seizure (HCC)    focal, ex vacuo ventriculomegaly?, pending neuro w/u with sleep deprived EEG   Syncope and collapse 08/19/2011    Social History   Socioeconomic History   Marital status: Married    Spouse name: Not on file   Number of children: 1   Years of education: Not on file   Highest education level: Not on file  Occupational History   Occupation: Disabled    Comment: depression   Occupation: DISABILITY    Employer: UNEMPLOYED  Tobacco Use   Smoking status: Former    Current packs/day: 0.00    Average packs/day: 1 pack/day for 20.0 years (20.0 ttl pk-yrs)    Types: Cigarettes    Start date: 08/12/1975    Quit date: 08/12/1995    Years since quitting: 27.9   Smokeless tobacco: Never  Vaping Use   Vaping status: Never Used  Substance and Sexual Activity   Alcohol use: No    Alcohol/week: 0.0 standard drinks of alcohol    Comment: occassinal   Drug use: No   Sexual activity: Not on file  Other Topics Concern   Not on file  Social History Narrative   No caffeine. Divorced. 1 child. 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, although endorses that she falls a lot    Social Determinants of Health  Financial Resource Strain: Low Risk  (03/25/2023)   Overall Financial Resource Strain (CARDIA)    Difficulty of Paying Living Expenses: Not hard at all  Food Insecurity: No Food Insecurity (03/25/2023)   Hunger Vital Sign    Worried About Running Out of Food in the Last Year: Never true    Ran Out of Food in the Last Year: Never true  Transportation Needs: No Transportation Needs (03/25/2023)   PRAPARE - Administrator, Civil Service (Medical): No    Lack of Transportation (Non-Medical): No  Physical Activity: Inactive (03/25/2023)   Exercise Vital Sign    Days of Exercise per Week: 0 days    Minutes of Exercise per Session: 0 min  Stress: No Stress Concern  Present (03/25/2023)   [employer] of Occupational Health - Occupational Stress Questionnaire    Feeling of Stress : Not at all  Social Connections: Moderately Isolated (03/25/2023)   Social Connection and Isolation Panel [NHANES]    Frequency of Communication with Friends and Family: More than three times a week    Frequency of Social Gatherings with Friends and Family: More than three times a week    Attends Religious Services: Never    Database administrator or Organizations: No    Attends Banker Meetings: Never    Marital Status: Married  Catering manager Violence: Not At Risk (03/25/2023)   Humiliation, Afraid, Rape, and Kick questionnaire    Fear of Current or Ex-Partner: No    Emotionally Abused: No    Physically Abused: No    Sexually Abused: No    Past Surgical History:  Procedure Laterality Date   ANKLE FUSION  02/2004   Right-50% disability    BRONCHIAL WASHINGS  03/04/2022   Procedure: BRONCHIAL WASHINGS;  Surgeon: Kalman Shan, MD;  Location: [facility];  Service: Endoscopy;;   CARPAL TUNNEL RELEASE  12/2017   L CTR repair, R thumb A1 pulley release (Gramig)   CESAREAN SECTION     HAMMER TOE SURGERY  09/12/2019   hospitalization  08/2011   syncope thought 2/2 seizure, MRI - Hyperintensity in the cerebral white matter bilaterally is nonspecific, started on Keppra   ORIF ANKLE FRACTURE  06/2003   Right   VIDEO BRONCHOSCOPY Bilateral 03/04/2022   Procedure: VIDEO BRONCHOSCOPY WITHOUT FLUORO;  Surgeon: Kalman Shan, MD;  Location: [facility];  Service: Endoscopy;  Laterality: Bilateral;    Family History  Problem Relation Age of Onset   Depression Mother    Arrhythmia Mother 64       Dx with VTach after having sudden collapse, did NOT have h/o AMI's    Atrial fibrillation Father    Hypertension Father    Osteoarthritis Father    Aortic aneurysm Father    Hepatitis Brother        Hep. C   Anemia Sister     Allergies  Allergen  Reactions   Amlodipine Swelling    Ankle edema    Current Outpatient Medications on File Prior to Visit  Medication Sig Dispense Refill   acetaminophen (TYLENOL) 325 MG tablet Take 2 tablets (650 mg total) by mouth every 6 (six) hours as needed for mild pain (or Fever >/= 101). 20 tablet 0   albuterol (VENTOLIN HFA) 108 (90 Base) MCG/ACT inhaler INHALE TWO PUFFS INTO THE LUNGS EVERY SIX HOURS AS NEEDED FOR WHEEZING OR SHORTNESS OF BREATH. 8.5 g 0   Ascorbic Acid (VITAMIN C) 1000 MG tablet Take 1,000 mg by  mouth daily.     cholecalciferol (VITAMIN D3) 25 MCG (1000 UNIT) tablet Take 1,000 Units by mouth daily.     cycloSPORINE (RESTASIS) 0.05 % ophthalmic emulsion Place 1 drop into both eyes 2 (two) times daily.     DULoxetine (CYMBALTA) 60 MG capsule Take 60 mg by mouth 2 (two) times daily.      escitalopram (LEXAPRO) 10 MG tablet Take 10 mg by mouth daily.      ferrous sulfate 325 (65 FE) MG tablet Take 325 mg by mouth daily with breakfast.     gabapentin (NEURONTIN) 300 MG capsule Take 300-1,200 mg by mouth See admin instructions. Take 1 capsule four times a day, then take 4 capsules at bedtime     lamoTRIgine (LAMICTAL) 150 MG tablet TAKE 1 TABLET BY MOUTH TWICE A DAY (Patient taking differently: Take 150 mg by mouth 2 (two) times daily.) 180 tablet 1   levothyroxine (SYNTHROID) 50 MCG tablet TAKE ONE TABLET BY MOUTH ONCE A DAY. TAKE ON AN EMPTY STOMACH WITH A GLASS OF WATER ATLEAST 30-60 MIN BEFORE BREAKFAST 90 tablet 3   MAGNESIUM PO Take 400 mg by mouth 3 (three) times daily.      Melatonin 10 MG TABS Take 2 tablets by mouth at bedtime.      Multiple Vitamin (MULTIVITAMIN) tablet Take 1 tablet by mouth daily.     naproxen (NAPROSYN) 500 MG tablet Take 1 tablet (500 mg total) by mouth 2 (two) times daily. 30 tablet 0   olmesartan (BENICAR) 20 MG tablet TAKE ONE TABLET BY MOUTH ONCE A DAY FOR BLOOD PRESSURE 90 tablet 3   primidone (MYSOLINE) 50 MG tablet Take 1 tablet (50 mg total) by mouth  2 (two) times daily. 180 tablet 3   propranolol (INDERAL) 40 MG tablet Take 1 tablet (40 mg total) by mouth daily. 90 tablet 3   rosuvastatin (CRESTOR) 20 MG tablet TAKE ONE TABLET BY MOUTH ONCE A DAY FOR CHOLESTEROL 90 tablet 3   tiZANidine (ZANAFLEX) 4 MG tablet Take 8 mg by mouth at bedtime.     vitamin B-12 (CYANOCOBALAMIN) 1000 MCG tablet Take 1,000 mcg by mouth daily.     No current facility-administered medications on file prior to visit.    BP (!) 150/110   Pulse 82   Temp 98.2 F (36.8 C) (Temporal)   Ht 5\' 5"  (1.651 m)   Wt 207 lb (93.9 kg)   LMP 06/01/2013   SpO2 99%   BMI 34.45 kg/m  Objective:   Physical Exam Cardiovascular:     Rate and Rhythm: Normal rate and regular rhythm.  Pulmonary:     Effort: Pulmonary effort is normal.     Breath sounds: Normal breath sounds.  Musculoskeletal:     Cervical back: Neck supple.  Skin:    General: Skin is warm and dry.  Neurological:     Mental Status: She is alert and oriented to person, place, and time.  Psychiatric:        Mood and Affect: Mood normal.           Assessment & Plan:  Essential hypertension Assessment & Plan: Above goal today, also with home readings.  Increase olmesartan to 40 mg daily. She will take 2 of her 20 mg tabs.   Continue to monitor BP at home. We will plan to follow up in 2 weeks.  Would avoid hydrochlorothiazide given hyponatremia previously. She refuses to try amlodipine at a lower dose given prior ankle edema.  Consider carvedilol if needed.         Doreene Nest, NP

## 2023-07-02 NOTE — Assessment & Plan Note (Addendum)
Above goal today, also with home readings.  Increase olmesartan to 40 mg daily. She will take 2 of her 20 mg tabs.   Continue to monitor BP at home. We will plan to follow up in 2 weeks.  Would avoid hydrochlorothiazide given hyponatremia previously. She refuses to try amlodipine at a lower dose given prior ankle edema.  Consider carvedilol if needed.

## 2023-07-08 ENCOUNTER — Ambulatory Visit
Admission: RE | Admit: 2023-07-08 | Discharge: 2023-07-08 | Disposition: A | Discharge status: Home / Self Care | Payer: Medicare PPO | Source: Ambulatory Visit | Attending: Primary Care | Admitting: Primary Care

## 2023-07-08 DIAGNOSIS — E278 Other specified disorders of adrenal gland: Secondary | ICD-10-CM | POA: Diagnosis present

## 2023-07-08 DIAGNOSIS — I700 Atherosclerosis of aorta: Secondary | ICD-10-CM | POA: Insufficient documentation

## 2023-07-08 DIAGNOSIS — K8020 Calculus of gallbladder without cholecystitis without obstruction: Secondary | ICD-10-CM | POA: Insufficient documentation

## 2023-07-08 DIAGNOSIS — D3501 Benign neoplasm of right adrenal gland: Secondary | ICD-10-CM | POA: Diagnosis not present

## 2023-07-08 DIAGNOSIS — R932 Abnormal findings on diagnostic imaging of liver and biliary tract: Secondary | ICD-10-CM | POA: Diagnosis not present

## 2023-07-08 DIAGNOSIS — N1330 Unspecified hydronephrosis: Secondary | ICD-10-CM | POA: Diagnosis not present

## 2023-07-16 ENCOUNTER — Ambulatory Visit: Payer: Medicare PPO | Admitting: Primary Care

## 2023-07-16 ENCOUNTER — Encounter: Payer: Self-pay | Admitting: Primary Care

## 2023-07-16 VITALS — BP 148/90 | HR 73 | Temp 97.8°F | Ht 65.0 in | Wt 208.0 lb

## 2023-07-16 DIAGNOSIS — I10 Essential (primary) hypertension: Secondary | ICD-10-CM | POA: Diagnosis not present

## 2023-07-16 MED ORDER — CARVEDILOL 6.25 MG PO TABS: 6.2500 mg | ORAL_TABLET | Freq: Two times a day (BID) | ORAL | 0 refills | Status: DC

## 2023-07-16 MED ORDER — OLMESARTAN MEDOXOMIL 40 MG PO TABS
40.0000 mg | ORAL_TABLET | Freq: Every day | ORAL | 2 refills | Status: DC
Start: 2023-07-16 — End: 2024-04-30

## 2023-07-16 NOTE — Patient Instructions (Signed)
Stop taking propranolol 40 mg daily.  Start taking carvedilol 6.25 mg twice daily for blood pressure.  Start taking olmesartan 40 mg daily for blood pressure.  I sent this pill to your pharmacy.  Please send me blood pressure readings and heart rate via MyChart in 2 weeks.  It was a pleasure to see you today!

## 2023-07-16 NOTE — Assessment & Plan Note (Signed)
Improved but not at goal.  Continue olmesartan 40 mg daily. Add carvedilol 6.25 mg twice daily. Discontinue propranolol 40 mg daily as this seems to be an effective for tremor per patient.  Her office visits have become cost prohibitive so we will have her send blood pressure readings via MyChart in 2 weeks.

## 2023-07-16 NOTE — Progress Notes (Signed)
Subjective:    Patient ID: Jamie Patterson, female    DOB: Mar 10, 1961, 62 y.o.   MRN: [id number]  HPI  Jamie Patterson is a very pleasant 62 y.o. female with a history of hypertension, hypothyroidism, seizure disorder, hyperlipidemia, deficiency anemia, prediabetes who presents today for follow-up of hypertension.  She was last evaluated on 07/02/2023 for evaluation of hypertension.  Blood pressure readings were above goal despite management on olmesartan 20 mg daily.  She had not taken hydrochlorothiazide in quite some time due to chronic hyponatremia.  During her visit her olmesartan was increased to 40 mg daily.  She is here for follow-up today.  Since her last visit she's compliant to her olmesartan 40mg  daily. She's checking her BP at home daily which is running 140-170/80-100s. She denies increased headaches.   She is managed on propranolol 40 mg daily per neurology for chronic tremor, she doesn't feel that this medication has been effective for tremor. She is managed on primidone which was increased during her recent visit.   BP Readings from Last 3 Encounters:  07/16/23 (!) 148/90  07/02/23 (!) 150/110  05/06/23 130/74      Review of Systems  Respiratory:  Positive for cough. Negative for shortness of breath.   Cardiovascular:  Negative for chest pain.  Neurological:  Negative for dizziness and headaches.         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   Trimalleolar fracture on right s/p fusion     Benign essential tremor    worsened by anxiety   Chronic pain due to injury    at R ankle due to ankle fracture   Depression    followed by psych   GERD (gastroesophageal reflux disease)    Hemoptysis 03/03/2022   History of hypertension    HLD (hyperlipidemia)    Imbalance    likely multifactorial (s/p balance training at [facility] 08/2010) ?CMT   Influenza A 09/15/2018   Iron deficiency anemia    LLL pneumonia 10/05/2018    Right ankle pain 2003   trimalleolar fx s/p fusion   Seizure (HCC)    focal, ex vacuo ventriculomegaly?, pending neuro w/u with sleep deprived EEG   Syncope and collapse 08/19/2011    Social History   Socioeconomic History   Marital status: Married    Spouse name: Not on file   Number of children: 1   Years of education: Not on file   Highest education level: Not on file  Occupational History   Occupation: Disabled    Comment: depression   Occupation: DISABILITY    Employer: UNEMPLOYED  Tobacco Use   Smoking status: Former    Current packs/day: 0.00    Average packs/day: 1 pack/day for 20.0 years (20.0 ttl pk-yrs)    Types: Cigarettes    Start date: 08/12/1975    Quit date: 08/12/1995    Years since quitting: 27.9   Smokeless tobacco: Never  Vaping Use   Vaping status: Never Used  Substance and Sexual Activity   Alcohol use: No    Alcohol/week: 0.0 standard drinks of alcohol    Comment: occassinal   Drug use: No   Sexual activity: Not on file  Other Topics Concern   Not on file  Social History Narrative   No caffeine. Divorced. 1 child. 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, although endorses that she falls  a lot    Social Determinants of Health   Financial Resource Strain: Low Risk  (03/25/2023)   Overall Financial Resource Strain (CARDIA)    Difficulty of Paying Living Expenses: Not hard at all  Food Insecurity: No Food Insecurity (03/25/2023)   Hunger Vital Sign    Worried About Running Out of Food in the Last Year: Never true    Ran Out of Food in the Last Year: Never true  Transportation Needs: No Transportation Needs (03/25/2023)   PRAPARE - Administrator, Civil Service (Medical): No    Lack of Transportation (Non-Medical): No  Physical Activity: Inactive (03/25/2023)   Exercise Vital Sign    Days of Exercise per Week: 0 days    Minutes of Exercise per Session: 0 min  Stress: No  Stress Concern Present (03/25/2023)   [employer] of Occupational Health - Occupational Stress Questionnaire    Feeling of Stress : Not at all  Social Connections: Moderately Isolated (03/25/2023)   Social Connection and Isolation Panel [NHANES]    Frequency of Communication with Friends and Family: More than three times a week    Frequency of Social Gatherings with Friends and Family: More than three times a week    Attends Religious Services: Never    Database administrator or Organizations: No    Attends Banker Meetings: Never    Marital Status: Married  Catering manager Violence: Not At Risk (03/25/2023)   Humiliation, Afraid, Rape, and Kick questionnaire    Fear of Current or Ex-Partner: No    Emotionally Abused: No    Physically Abused: No    Sexually Abused: No    Past Surgical History:  Procedure Laterality Date   ANKLE FUSION  02/2004   Right-50% disability    BRONCHIAL WASHINGS  03/04/2022   Procedure: BRONCHIAL WASHINGS;  Surgeon: Kalman Shan, MD;  Location: [facility];  Service: Endoscopy;;   CARPAL TUNNEL RELEASE  12/2017   L CTR repair, R thumb A1 pulley release (Gramig)   CESAREAN SECTION     HAMMER TOE SURGERY  09/12/2019   hospitalization  08/2011   syncope thought 2/2 seizure, MRI - Hyperintensity in the cerebral white matter bilaterally is nonspecific, started on Keppra   ORIF ANKLE FRACTURE  06/2003   Right   VIDEO BRONCHOSCOPY Bilateral 03/04/2022   Procedure: VIDEO BRONCHOSCOPY WITHOUT FLUORO;  Surgeon: Kalman Shan, MD;  Location: [facility];  Service: Endoscopy;  Laterality: Bilateral;    Family History  Problem Relation Age of Onset   Depression Mother    Arrhythmia Mother 52       Dx with VTach after having sudden collapse, did NOT have h/o AMI's    Atrial fibrillation Father    Hypertension Father    Osteoarthritis Father    Aortic aneurysm Father    Hepatitis Brother        Hep. C   Anemia Sister     Allergies   Allergen Reactions   Amlodipine Swelling    Ankle edema    Current Outpatient Medications on File Prior to Visit  Medication Sig Dispense Refill   acetaminophen (TYLENOL) 325 MG tablet Take 2 tablets (650 mg total) by mouth every 6 (six) hours as needed for mild pain (or Fever >/= 101). 20 tablet 0   albuterol (VENTOLIN HFA) 108 (90 Base) MCG/ACT inhaler INHALE TWO PUFFS INTO THE LUNGS EVERY SIX HOURS AS NEEDED FOR WHEEZING OR SHORTNESS OF BREATH. 8.5 g 0  Ascorbic Acid (VITAMIN C) 1000 MG tablet Take 1,000 mg by mouth daily.     cholecalciferol (VITAMIN D3) 25 MCG (1000 UNIT) tablet Take 1,000 Units by mouth daily.     cycloSPORINE (RESTASIS) 0.05 % ophthalmic emulsion Place 1 drop into both eyes 2 (two) times daily.     DULoxetine (CYMBALTA) 60 MG capsule Take 60 mg by mouth 2 (two) times daily.      escitalopram (LEXAPRO) 10 MG tablet Take 10 mg by mouth daily.      ferrous sulfate 325 (65 FE) MG tablet Take 325 mg by mouth daily with breakfast.     gabapentin (NEURONTIN) 300 MG capsule Take 300-1,200 mg by mouth See admin instructions. Take 1 capsule four times a day, then take 4 capsules at bedtime     lamoTRIgine (LAMICTAL) 150 MG tablet TAKE 1 TABLET BY MOUTH TWICE A DAY (Patient taking differently: Take 150 mg by mouth 2 (two) times daily.) 180 tablet 1   levothyroxine (SYNTHROID) 50 MCG tablet TAKE ONE TABLET BY MOUTH ONCE A DAY. TAKE ON AN EMPTY STOMACH WITH A GLASS OF WATER ATLEAST 30-60 MIN BEFORE BREAKFAST 90 tablet 3   MAGNESIUM PO Take 400 mg by mouth 3 (three) times daily.      Melatonin 10 MG TABS Take 2 tablets by mouth at bedtime.      Multiple Vitamin (MULTIVITAMIN) tablet Take 1 tablet by mouth daily.     naproxen (NAPROSYN) 500 MG tablet Take 1 tablet (500 mg total) by mouth 2 (two) times daily. 30 tablet 0   primidone (MYSOLINE) 50 MG tablet Take 1 tablet (50 mg total) by mouth 2 (two) times daily. 180 tablet 3   rosuvastatin (CRESTOR) 20 MG tablet TAKE ONE TABLET BY  MOUTH ONCE A DAY FOR CHOLESTEROL 90 tablet 3   tiZANidine (ZANAFLEX) 4 MG tablet Take 8 mg by mouth at bedtime.     vitamin B-12 (CYANOCOBALAMIN) 1000 MCG tablet Take 1,000 mcg by mouth daily.     No current facility-administered medications on file prior to visit.    BP (!) 148/90   Pulse 73   Temp 97.8 F (36.6 C) (Temporal)   Ht 5\' 5"  (1.651 m)   Wt 208 lb (94.3 kg)   LMP 06/01/2013   SpO2 99%   BMI 34.61 kg/m  Objective:   Physical Exam Cardiovascular:     Rate and Rhythm: Normal rate and regular rhythm.  Pulmonary:     Effort: Pulmonary effort is normal.     Breath sounds: Normal breath sounds.  Musculoskeletal:     Cervical back: Neck supple.  Skin:    General: Skin is warm and dry.  Neurological:     Mental Status: She is alert and oriented to person, place, and time.  Psychiatric:        Mood and Affect: Mood normal.           Assessment & Plan:  Essential hypertension Assessment & Plan: Improved but not at goal.  Continue olmesartan 40 mg daily. Add carvedilol 6.25 mg twice daily. Discontinue propranolol 40 mg daily as this seems to be an effective for tremor per patient.  Her office visits have become cost prohibitive so we will have her send blood pressure readings via MyChart in 2 weeks.  Orders: -     Carvedilol; Take 1 tablet (6.25 mg total) by mouth 2 (two) times daily with a meal. for blood pressure.  Dispense: 180 tablet; Refill: 0 -  Olmesartan Medoxomil; Take 1 tablet (40 mg total) by mouth daily. for blood pressure.  Dispense: 90 tablet; Refill: 2        Doreene Nest, NP

## 2023-07-23 ENCOUNTER — Other Ambulatory Visit: Payer: Self-pay | Admitting: Primary Care

## 2023-07-23 DIAGNOSIS — J189 Pneumonia, unspecified organism: Secondary | ICD-10-CM

## 2023-07-23 MED ORDER — AMOXICILLIN 500 MG PO CAPS
1000.0000 mg | ORAL_CAPSULE | Freq: Three times a day (TID) | ORAL | 0 refills | Status: AC
Start: 2023-07-23 — End: 2023-07-30

## 2023-07-23 MED ORDER — AZITHROMYCIN 250 MG PO TABS
ORAL_TABLET | ORAL | 0 refills | Status: AC
Start: 2023-07-23 — End: 2023-07-28

## 2023-07-28 ENCOUNTER — Encounter: Payer: Self-pay | Admitting: Primary Care

## 2023-08-07 DIAGNOSIS — S83411A Sprain of medial collateral ligament of right knee, initial encounter: Secondary | ICD-10-CM | POA: Diagnosis not present

## 2023-08-07 DIAGNOSIS — M1612 Unilateral primary osteoarthritis, left hip: Secondary | ICD-10-CM | POA: Diagnosis not present

## 2023-08-10 DIAGNOSIS — I10 Essential (primary) hypertension: Secondary | ICD-10-CM

## 2023-08-10 DIAGNOSIS — E7800 Pure hypercholesterolemia, unspecified: Secondary | ICD-10-CM

## 2023-08-10 DIAGNOSIS — R7303 Prediabetes: Secondary | ICD-10-CM

## 2023-08-13 DIAGNOSIS — Z124 Encounter for screening for malignant neoplasm of cervix: Secondary | ICD-10-CM | POA: Diagnosis not present

## 2023-08-13 DIAGNOSIS — Z6838 Body mass index (BMI) 38.0-38.9, adult: Secondary | ICD-10-CM | POA: Diagnosis not present

## 2023-08-13 DIAGNOSIS — Z1231 Encounter for screening mammogram for malignant neoplasm of breast: Secondary | ICD-10-CM | POA: Diagnosis not present

## 2023-08-13 DIAGNOSIS — Z1151 Encounter for screening for human papillomavirus (HPV): Secondary | ICD-10-CM | POA: Diagnosis not present

## 2023-08-13 LAB — HM MAMMOGRAPHY

## 2023-08-26 DIAGNOSIS — M25552 Pain in left hip: Secondary | ICD-10-CM | POA: Diagnosis not present

## 2023-09-29 NOTE — Progress Notes (Signed)
 CARDIOLOGY CONSULT NOTE       Patient ID: Jamie Patterson MRN: [id number] DOB/AGE: 62-Oct-1962 62 62 y.o.  Admit date: (Not on file) Referring Physician: Chestine Spore Primary Physician: Doreene Nest, NP Primary Cardiologist: New Reason for Consultation: CAD Risk     HPI:  63 y.o. referred by NP Chestine Spore for CAD risk. History of benign tremor, depression followed by [facility], HTN, HLD former smoker quit in 1997 with 20 pack year history and A1c 5.8 HTN Rx with olmesartan No diuretic due to low sodium. She takes propanolol for her tremor. Also on primidone. Beta blocker changed to coreg as propanolol not effective for tremor and BP high. Na was normal over the last year with latest 05/06/23 137. LDL is 101   She has labile BP. More interesting is elevated HR;s 90-110 even on low dose coreg. She has no chest pain. Mild exertional dyspnea. Compliant with meds No excess ETOH  She has central polypharmacy which may contribute to labile BP and tachycardia  ROS All other systems reviewed and negative except as noted above  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   Trimalleolar fracture on right s/p fusion     Benign essential tremor    worsened by anxiety   Chronic pain due to injury    at R ankle due to ankle fracture   Depression    followed by psych   GERD (gastroesophageal reflux disease)    Hemoptysis 03/03/2022   History of hypertension    HLD (hyperlipidemia)    Imbalance    likely multifactorial (s/p balance training at [facility] 08/2010) ?CMT   Influenza A 09/15/2018   Iron deficiency anemia    LLL pneumonia 10/05/2018   Right ankle pain 2003   trimalleolar fx s/p fusion   Seizure (HCC)    focal, ex vacuo ventriculomegaly?, pending neuro w/u with sleep deprived EEG   Syncope and collapse 08/19/2011    Family History  Problem Relation Age of Onset   Depression Mother    Arrhythmia Mother 36       Dx with VTach after having sudden  collapse, did NOT have h/o AMI's    Atrial fibrillation Father    Hypertension Father    Osteoarthritis Father    Aortic aneurysm Father    Hepatitis Brother        Hep. C   Anemia Sister     Social History   Socioeconomic History   Marital status: Married    Spouse name: Not on file   Number of children: 1   Years of education: Not on file   Highest education level: Not on file  Occupational History   Occupation: Disabled    Comment: depression   Occupation: DISABILITY    Employer: UNEMPLOYED  Tobacco Use   Smoking status: Former    Current packs/day: 0.00    Average packs/day: 1 pack/day for 20.0 years (20.0 ttl pk-yrs)    Types: Cigarettes    Start date: 08/12/1975    Quit date: 08/12/1995    Years since quitting: 28.1   Smokeless tobacco: Never  Vaping Use   Vaping status: Never Used  Substance and Sexual Activity   Alcohol use: No    Alcohol/week: 0.0 standard drinks of alcohol    Comment: occassinal   Drug use: No   Sexual activity: Not on file  Other Topics Concern   Not on file  Social History Narrative   No  caffeine. Divorced. 1 child. 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, although endorses that she falls a lot    Social Drivers of Health   Financial Resource Strain: Low Risk  (03/25/2023)   Overall Financial Resource Strain (CARDIA)    Difficulty of Paying Living Expenses: Not hard at all  Food Insecurity: No Food Insecurity (03/25/2023)   Hunger Vital Sign    Worried About Running Out of Food in the Last Year: Never true    Ran Out of Food in the Last Year: Never true  Transportation Needs: No Transportation Needs (03/25/2023)   PRAPARE - Administrator, Civil Service (Medical): No    Lack of Transportation (Non-Medical): No  Physical Activity: Inactive (03/25/2023)   Exercise Vital Sign    Days of Exercise per Week: 0 days    Minutes of Exercise per Session: 0 min  Stress:  No Stress Concern Present (03/25/2023)   [employer] of Occupational Health - Occupational Stress Questionnaire    Feeling of Stress : Not at all  Social Connections: Moderately Isolated (03/25/2023)   Social Connection and Isolation Panel [NHANES]    Frequency of Communication with Friends and Family: More than three times a week    Frequency of Social Gatherings with Friends and Family: More than three times a week    Attends Religious Services: Never    Database administrator or Organizations: No    Attends Banker Meetings: Never    Marital Status: Married  Catering manager Violence: Not At Risk (03/25/2023)   Humiliation, Afraid, Rape, and Kick questionnaire    Fear of Current or Ex-Partner: No    Emotionally Abused: No    Physically Abused: No    Sexually Abused: No    Past Surgical History:  Procedure Laterality Date   ANKLE FUSION  02/2004   Right-50% disability    BRONCHIAL WASHINGS  03/04/2022   Procedure: BRONCHIAL WASHINGS;  Surgeon: Kalman Shan, MD;  Location: [facility];  Service: Endoscopy;;   CARPAL TUNNEL RELEASE  12/2017   L CTR repair, R thumb A1 pulley release (Gramig)   CESAREAN SECTION     HAMMER TOE SURGERY  09/12/2019   hospitalization  08/2011   syncope thought 2/2 seizure, MRI - Hyperintensity in the cerebral white matter bilaterally is nonspecific, started on Keppra   ORIF ANKLE FRACTURE  06/2003   Right   VIDEO BRONCHOSCOPY Bilateral 03/04/2022   Procedure: VIDEO BRONCHOSCOPY WITHOUT FLUORO;  Surgeon: Kalman Shan, MD;  Location: [facility];  Service: Endoscopy;  Laterality: Bilateral;      Current Outpatient Medications:    acetaminophen (TYLENOL) 325 MG tablet, Take 2 tablets (650 mg total) by mouth every 6 (six) hours as needed for mild pain (or Fever >/= 101)., Disp: 20 tablet, Rfl: 0   albuterol (VENTOLIN HFA) 108 (90 Base) MCG/ACT inhaler, INHALE TWO PUFFS INTO THE LUNGS EVERY SIX HOURS AS NEEDED FOR WHEEZING OR  SHORTNESS OF BREATH., Disp: 8.5 g, Rfl: 0   Ascorbic Acid (VITAMIN C) 1000 MG tablet, Take 1,000 mg by mouth daily., Disp: , Rfl:    carvedilol (COREG) 6.25 MG tablet, Take 1 tablet (6.25 mg total) by mouth 2 (two) times daily with a meal. for blood pressure., Disp: 180 tablet, Rfl: 0   cholecalciferol (VITAMIN D3) 25 MCG (1000 UNIT) tablet, Take 1,000 Units by mouth daily., Disp: , Rfl:    cycloSPORINE (RESTASIS) 0.05 %  ophthalmic emulsion, Place 1 drop into both eyes 2 (two) times daily., Disp: , Rfl:    DULoxetine (CYMBALTA) 60 MG capsule, Take 60 mg by mouth 2 (two) times daily. , Disp: , Rfl:    escitalopram (LEXAPRO) 10 MG tablet, Take 10 mg by mouth daily. , Disp: , Rfl:    ferrous sulfate 325 (65 FE) MG tablet, Take 325 mg by mouth daily with breakfast., Disp: , Rfl:    gabapentin (NEURONTIN) 300 MG capsule, Take 300-1,200 mg by mouth See admin instructions. Take 1 capsule four times a day, then take 4 capsules at bedtime, Disp: , Rfl:    lamoTRIgine (LAMICTAL) 150 MG tablet, TAKE 1 TABLET BY MOUTH TWICE A DAY (Patient taking differently: Take 150 mg by mouth 2 (two) times daily.), Disp: 180 tablet, Rfl: 1   levothyroxine (SYNTHROID) 50 MCG tablet, TAKE ONE TABLET BY MOUTH ONCE A DAY. TAKE ON AN EMPTY STOMACH WITH A GLASS OF WATER ATLEAST 30-60 MIN BEFORE BREAKFAST, Disp: 90 tablet, Rfl: 3   MAGNESIUM PO, Take 400 mg by mouth 3 (three) times daily. , Disp: , Rfl:    Melatonin 10 MG TABS, Take 2 tablets by mouth at bedtime. , Disp: , Rfl:    meloxicam (MOBIC) 15 MG tablet, Take 15 mg by mouth daily., Disp: , Rfl:    Multiple Vitamin (MULTIVITAMIN) tablet, Take 1 tablet by mouth daily., Disp: , Rfl:    naproxen (NAPROSYN) 500 MG tablet, Take 1 tablet (500 mg total) by mouth 2 (two) times daily., Disp: 30 tablet, Rfl: 0   olmesartan (BENICAR) 40 MG tablet, Take 1 tablet (40 mg total) by mouth daily. for blood pressure., Disp: 90 tablet, Rfl: 2   primidone (MYSOLINE) 50 MG tablet, Take 1 tablet  (50 mg total) by mouth 2 (two) times daily., Disp: 180 tablet, Rfl: 3   rosuvastatin (CRESTOR) 20 MG tablet, TAKE ONE TABLET BY MOUTH ONCE A DAY FOR CHOLESTEROL, Disp: 90 tablet, Rfl: 3   tiZANidine (ZANAFLEX) 4 MG tablet, Take 8 mg by mouth at bedtime., Disp: , Rfl:    vitamin B-12 (CYANOCOBALAMIN) 1000 MCG tablet, Take 1,000 mcg by mouth daily., Disp: , Rfl:     Physical Exam: Blood pressure (!) 148/90, pulse (!) 120, height 5\' 5"  (1.651 m), weight 215 lb 12.8 oz (97.9 kg), last menstrual period 06/01/2013, SpO2 94%.   Affect appropriate Healthy:  appears stated age HEENT: normal Neck supple with no adenopathy JVP normal no bruits no thyromegaly Lungs clear with no wheezing and good diaphragmatic motion Heart:  S1/S2 no murmur, no rub, gallop or click PMI normal Abdomen: benighn, BS positve, no tenderness, no AAA no bruit.  No HSM or HJR Distal pulses intact with no bruits No edema Neuro non-focal Skin warm and dry No muscular weakness   Labs:   Lab Results  Component Value Date   WBC 7.1 05/06/2023   HGB 14.3 05/06/2023   HCT 43.4 05/06/2023   MCV 98.7 05/06/2023   PLT 327.0 05/06/2023   No results for input(s): "NA", "K", "CL", "CO2", "BUN", "CREATININE", "CALCIUM", "PROT", "BILITOT", "ALKPHOS", "ALT", "AST", "GLUCOSE" in the last 168 hours.  Invalid input(s): "LABALBU" Lab Results  Component Value Date   TROPONINI <0.30 08/20/2011    Lab Results  Component Value Date   CHOL 194 05/06/2023   CHOL 179 07/07/2022   CHOL 211 (H) 05/01/2022   Lab Results  Component Value Date   HDL 66.70 05/06/2023   HDL 66.50 07/07/2022  HDL 71.70 05/01/2022   Lab Results  Component Value Date   LDLCALC 101 (H) 05/06/2023   LDLCALC 92 07/07/2022   LDLCALC 111 (H) 05/01/2022   Lab Results  Component Value Date   TRIG 130.0 05/06/2023   TRIG 99.0 07/07/2022   TRIG 138.0 05/01/2022   Lab Results  Component Value Date   CHOLHDL 3 05/06/2023   CHOLHDL 3 07/07/2022    CHOLHDL 3 05/01/2022   Lab Results  Component Value Date   LDLDIRECT 90.0 03/14/2021   LDLDIRECT 135.7 08/09/2009   LDLDIRECT 220.8 06/28/2009      Radiology: No results found.  EKG: 2020 ST otherwise normal ST rate 113 Q lead 3    ASSESSMENT AND PLAN:   HTN:  continue  continue ARB Increase coreg over next to weeks to 25 mg bid given persistent elevated HR. TTE to evaluate  EF and LVH  HLD:  continue crestor adjust dosing based on calcium score Hypothyroid:  continue synthroid TSH normal  Tremor:  on mysoline and now coreg as propanolol not helpful f/u TAT Depression:  has been on lexapro and cymbalta stable   Calcium score Echo Increase coreg to 25 mg bid   F/U  pending studies   Signed: Charlton Haws 10/07/2023, 10:59 AM

## 2023-10-07 ENCOUNTER — Other Ambulatory Visit: Payer: Self-pay | Admitting: Primary Care

## 2023-10-07 ENCOUNTER — Ambulatory Visit: Payer: Medicare PPO | Attending: Cardiovascular Disease | Admitting: Cardiovascular Disease

## 2023-10-07 ENCOUNTER — Encounter: Payer: Self-pay | Admitting: Cardiovascular Disease

## 2023-10-07 VITALS — BP 148/90 | HR 120 | Ht 65.0 in | Wt 215.8 lb

## 2023-10-07 DIAGNOSIS — I10 Essential (primary) hypertension: Secondary | ICD-10-CM

## 2023-10-07 DIAGNOSIS — R0600 Dyspnea, unspecified: Secondary | ICD-10-CM | POA: Diagnosis not present

## 2023-10-07 DIAGNOSIS — R000 Tachycardia, unspecified: Secondary | ICD-10-CM | POA: Diagnosis not present

## 2023-10-07 DIAGNOSIS — E7800 Pure hypercholesterolemia, unspecified: Secondary | ICD-10-CM

## 2023-10-07 MED ORDER — CARVEDILOL 25 MG PO TABS: 25.0000 mg | ORAL_TABLET | Freq: Two times a day (BID) | ORAL | 3 refills | Status: DC

## 2023-10-07 NOTE — Patient Instructions (Addendum)
 Medication Instructions:  Your physician has recommended you make the following change in your medication:  1-INCREASE Carvedilol 12.5 mg by mouth twice daily for 2 weeks, then increase to 25 mg by mouth twice daily.  *If you need a refill on your cardiac medications before your next appointment, please call your pharmacy*  Lab Work: If you have labs (blood work) drawn today and your tests are completely normal, you will receive your results only by: MyChart Message (if you have MyChart) OR A paper copy in the mail If you have any lab test that is abnormal or we need to change your treatment, we will call you to review the results.  Testing/Procedures: CT scanning for a cardiac calcium score (CAT scanning), is a noninvasive, special x-ray that produces cross-sectional images of the body using x-rays and a computer. CT scans help physicians diagnose and treat medical conditions. For some CT exams, a contrast material is used to enhance visibility in the area of the body being studied. CT scans provide greater clarity and reveal more details than regular x-ray exams.  Your physician has requested that you have an echocardiogram. Echocardiography is a painless test that uses sound waves to create images of your heart. It provides your doctor with information about the size and shape of your heart and how well your heart's chambers and valves are working. This procedure takes approximately one hour. There are no restrictions for this procedure. Please do NOT wear cologne, perfume, aftershave, or lotions (deodorant is allowed). Please arrive 15 minutes prior to your appointment time.  Please note: We ask at that you not bring children with you during ultrasound (echo/ vascular) testing. Due to room size and safety concerns, children are not allowed in the ultrasound rooms during exams. Our front office staff cannot provide observation of children in our lobby area while testing is being conducted. An  adult accompanying a patient to their appointment will only be allowed in the ultrasound room at the discretion of the ultrasound technician under special circumstances. We apologize for any inconvenience. Follow-Up: At [facility], you and your health needs are our priority.  As part of our continuing mission to provide you with exceptional heart care, we have created designated Provider Care Teams.  These Care Teams include your primary Cardiologist (physician) and Advanced Practice Providers (APPs -  Physician Assistants and Nurse Practitioners) who all work together to provide you with the care you need, when you need it.  We recommend signing up for the patient portal called "MyChart".  Sign up information is provided on this After Visit Summary.  MyChart is used to connect with patients for Virtual Visits (Telemedicine).  Patients are able to view lab/test results, encounter notes, upcoming appointments, etc.  Non-urgent messages can be sent to your provider as well.   To learn more about what you can do with MyChart, go to [email or web address].    Your next appointment:   4 to 6 weeks    Provider:   With PA or NP, then 6 months with Charlton Haws, MD     Other Instructions       1st Floor: - Lobby - Registration  - Pharmacy  - Lab - Cafe  2nd Floor: - PV Lab - Diagnostic Testing (echo, CT, nuclear med)  3rd Floor: - Vacant  4th Floor: - TCTS (cardiothoracic surgery) - AFib Clinic - Structural Heart Clinic - Vascular Surgery  - Vascular Ultrasound  5th Floor: - [facility] (general and EP) -  Clinical Pharmacy for coumadin, hypertension, lipid, weight-loss medications, and med management appointments    Valet parking services will be available as well.

## 2023-10-16 ENCOUNTER — Ambulatory Visit
Admission: RE | Admit: 2023-10-16 | Discharge: 2023-10-16 | Disposition: A | Discharge status: Home / Self Care | Payer: Self-pay | Source: Ambulatory Visit | Attending: Cardiovascular Disease | Admitting: Cardiovascular Disease

## 2023-10-16 DIAGNOSIS — I10 Essential (primary) hypertension: Secondary | ICD-10-CM | POA: Insufficient documentation

## 2023-10-16 DIAGNOSIS — E7800 Pure hypercholesterolemia, unspecified: Secondary | ICD-10-CM | POA: Insufficient documentation

## 2023-10-16 DIAGNOSIS — R0600 Dyspnea, unspecified: Secondary | ICD-10-CM | POA: Insufficient documentation

## 2023-10-16 DIAGNOSIS — R000 Tachycardia, unspecified: Secondary | ICD-10-CM | POA: Insufficient documentation

## 2023-10-19 ENCOUNTER — Telehealth: Payer: Self-pay

## 2023-10-19 DIAGNOSIS — E7800 Pure hypercholesterolemia, unspecified: Secondary | ICD-10-CM

## 2023-10-19 DIAGNOSIS — R931 Abnormal findings on diagnostic imaging of heart and coronary circulation: Secondary | ICD-10-CM

## 2023-10-19 NOTE — Telephone Encounter (Signed)
-----   Message from Charlton Haws sent at 10/16/2023  4:35 PM EST ----- Calcium score not that high 14.2 but high percentile for age needs f/u lipid and liver on crestor target LDL < 70

## 2023-10-19 NOTE — Telephone Encounter (Signed)
 The patient has been notified of the result and verbalized understanding.  All questions (if any) were answered. Ethelda Chick, RN 10/19/2023 12:15 PM   Will place order for lab work. Patient will have done at any [facility].

## 2023-10-21 ENCOUNTER — Telehealth: Payer: Self-pay | Admitting: Cardiovascular Disease

## 2023-10-21 ENCOUNTER — Other Ambulatory Visit: Payer: Self-pay

## 2023-10-21 DIAGNOSIS — Z79899 Other long term (current) drug therapy: Secondary | ICD-10-CM

## 2023-10-21 NOTE — Telephone Encounter (Signed)
 Labs have been reordered.  Patient is aware

## 2023-10-21 NOTE — Telephone Encounter (Signed)
 Patient wants orders for lab test re-sent to [facility].  Patient stated she went to have blood drawn yesterday and orders were not available.  Patient wants call back to confirm orders will be available.

## 2023-10-22 DIAGNOSIS — R931 Abnormal findings on diagnostic imaging of heart and coronary circulation: Secondary | ICD-10-CM | POA: Diagnosis not present

## 2023-10-22 DIAGNOSIS — E7800 Pure hypercholesterolemia, unspecified: Secondary | ICD-10-CM | POA: Diagnosis not present

## 2023-10-22 LAB — LIPID PANEL
Chol/HDL Ratio: 3.1 ratio (ref 0.0–4.4)
Cholesterol, Total: 242 mg/dL — ABNORMAL HIGH (ref 100–199)
HDL: 78 mg/dL (ref >39)
LDL Chol Calc (NIH): 148 mg/dL — ABNORMAL HIGH (ref 0–99)
Triglycerides: 93 mg/dL (ref 0–149)
VLDL Cholesterol Cal: 16 mg/dL (ref 5–40)

## 2023-10-22 LAB — HEPATIC FUNCTION PANEL
ALT: 24 IU/L (ref 0–32)
AST: 25 IU/L (ref 0–40)
Albumin: 4.5 g/dL (ref 3.9–4.9)
Alkaline Phosphatase: 123 IU/L — ABNORMAL HIGH (ref 44–121)
Bilirubin Total: 0.2 mg/dL (ref 0.0–1.2)
Bilirubin, Direct: 0.11 mg/dL (ref 0.00–0.40)
Total Protein: 7 g/dL (ref 6.0–8.5)

## 2023-10-29 ENCOUNTER — Ambulatory Visit (HOSPITAL_COMMUNITY): Payer: Medicare PPO | Attending: Cardiovascular Disease

## 2023-10-29 DIAGNOSIS — I10 Essential (primary) hypertension: Secondary | ICD-10-CM | POA: Diagnosis not present

## 2023-10-29 DIAGNOSIS — R0600 Dyspnea, unspecified: Secondary | ICD-10-CM | POA: Diagnosis not present

## 2023-10-29 DIAGNOSIS — R000 Tachycardia, unspecified: Secondary | ICD-10-CM | POA: Insufficient documentation

## 2023-10-29 DIAGNOSIS — E7800 Pure hypercholesterolemia, unspecified: Secondary | ICD-10-CM | POA: Insufficient documentation

## 2023-10-29 LAB — ECHOCARDIOGRAM COMPLETE
Area-P 1/2: 4.8 cm2
S' Lateral: 3.3 cm

## 2023-10-30 NOTE — Telephone Encounter (Signed)
-----   Message from Charlton Haws sent at 10/30/2023  8:50 AM EDT ----- Over read has some lung abnormalities has had bronch before needs f/u with [facility] last seen there 2023

## 2023-11-09 ENCOUNTER — Other Ambulatory Visit: Payer: Self-pay | Admitting: Primary Care

## 2023-11-09 DIAGNOSIS — R053 Chronic cough: Secondary | ICD-10-CM

## 2023-11-09 DIAGNOSIS — E7800 Pure hypercholesterolemia, unspecified: Secondary | ICD-10-CM

## 2023-11-12 MED ORDER — ROSUVASTATIN CALCIUM 40 MG PO TABS: 40.0000 mg | ORAL_TABLET | Freq: Every day | ORAL | 3 refills | Status: AC

## 2023-11-12 NOTE — Telephone Encounter (Signed)
-----   Message from Charlton Haws sent at 11/12/2023 12:33 PM EDT ----- LDL high given high calcium score no need for ETT increase crestor to 40 mg daily and repeat labs in 3 months Lipid/Liver Make sure she has pulmonary f/u

## 2023-11-12 NOTE — Telephone Encounter (Signed)
 The patient has been notified of the result and verbalized understanding.  All questions (if any) were answered. Ethelda Chick, RN 11/12/2023 4:07 PM   Placed orders.

## 2023-11-27 ENCOUNTER — Ambulatory Visit: Payer: Medicare PPO | Admitting: Cardiology

## 2023-12-02 ENCOUNTER — Other Ambulatory Visit: Payer: Self-pay | Admitting: Primary Care

## 2023-12-02 DIAGNOSIS — R053 Chronic cough: Secondary | ICD-10-CM

## 2023-12-15 ENCOUNTER — Encounter: Payer: Self-pay | Admitting: Internal Medicine

## 2023-12-15 ENCOUNTER — Ambulatory Visit: Admitting: Internal Medicine

## 2023-12-15 VITALS — BP 126/64 | HR 82 | Ht 65.0 in | Wt 217.4 lb

## 2023-12-15 DIAGNOSIS — R9389 Abnormal findings on diagnostic imaging of other specified body structures: Secondary | ICD-10-CM

## 2023-12-15 DIAGNOSIS — R0602 Shortness of breath: Secondary | ICD-10-CM

## 2023-12-15 DIAGNOSIS — J324 Chronic pansinusitis: Secondary | ICD-10-CM

## 2023-12-15 DIAGNOSIS — R053 Chronic cough: Secondary | ICD-10-CM

## 2023-12-15 NOTE — Patient Instructions (Addendum)
 It was a pleasure to see you today!  Please schedule follow up with myself in 2 months.  If my schedule is not open yet, we will contact you with a reminder closer to that time. Please call [phone] if you haven't heard from us  a month before, and always call us  sooner if issues or concerns arise. You can also send us  a message through MyChart, but but aware that this is not to be used for urgent issues and it may take up to 5-7 days to receive a reply. Please be aware that you will likely be able to view your results before I have a chance to respond to them. Please give us  5 business days to respond to any non-urgent results.    Before your next visit I would like you to have: CT Scan of your chest  Changes in your lungs are concerning for atypical chronic infection. We need to check some sputum samples to see if we can capture the bacteria growing.   Will order dedicated high resolution CT Chest to further exam lungs.   Instructions for getting a sputum sample:  Collect as soon as you wake up in the morning Do not eat or drink anything before collecting sample Only one sample per day per cup. Collect each sample in its own separate cups Only use cup care provider provided for you Do not use own plastic home containers Wash your hands before opening collection cup Do not take off lip until you are ready to place sample in cup  How to collect sputum sample 1. Loosen sputum Take 3 slow deep breath, so you can feel it from your stomach If you have a vest you may use this device to help break up sputum  2. Collect sample Wash your hands Take lid off Press rim of cup against lower lip  Take deep breath and hope for 2-3 seconds, then cough deeply by using stomach muscles to force out sputum. Make sure if comes from your stomach not your  not your throat or chest. GOAL: collect 3-62mL of sputum try to avoid saliva or spit  3. Refrigerate sample Close container  Place date on  label provided for you - should be in bag Put label on cup Put cup in bag provided to your from office Place in refrigerator right away. DO NOT FREEZE IT.  Bring your sample to the [address] location, or you can take it to the [place] [facility] office.   Reminders Patient should have 2 cups if collecting more than one order Obtain sample in the morning Return to our office within 4 hours of producing sample Produce enough sputum to reach the line marked by clinical staff  Make sure it is sputum and not spit  Any questions call the nearest office [place]- [phone] [place]- [phone] [place]- [phone]

## 2023-12-15 NOTE — Progress Notes (Addendum)
 Jamie Patterson    [id number]    Aug 27, 1960  Primary Care Physician:Clark, Murlean Armour, NP Date of Appointment: 12/15/2023 Established Patient Visit  Chief complaint:   Chief Complaint  Patient presents with   Follow-up    Spot showing on Cardiac scan. Ongoing cough.      HPI: Jamie Patterson is a 63 y.o. woman with chronic cough.Hospitalized for hemoptysis in 2023 and had a bronchoscopy negative for DAH. Cultures grew few haemophilus influenza and MRSA, AFB negative. She was negative for DVT/PE. Has been on neurontin  for 1200 mg daily for chronic pain and mood.   Interval Updates: Here for follow up after 2 years. She continues to have chronic cough and shortness of breath. Cough is productive especially in the mornings with brown mucus. Also wakes her up at night.   She had an episode of hemoptysis - a cup full of bright red blood -  in November 2024 but it resolved spontaneously and she never sought any care.   She had a CT Coronary calcium  scan in March 2025 which showed some abnormal changes and is here for follow up.   Breathing and coughing was really bad and she cannot tell if it is related more to sinus issues or her lungs.   She saw an ENT in [place] in and was treated for MRSA staph infection in sinuses. She continues to have sinus drainage.   Taking albuterol  inhaler every other day about once/day.   Limited mobility from knee arthritis which limits her activity more than breathing.   I have reviewed the patient's family social and past medical history and updated as appropriate.   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   Trimalleolar fracture on right s/p fusion     Benign essential tremor    worsened by anxiety   Chronic pain due to injury    at R ankle due to ankle fracture   Depression    followed by psych   GERD (gastroesophageal reflux disease)    Hemoptysis 03/03/2022   History of hypertension     HLD (hyperlipidemia)    Imbalance    likely multifactorial (s/p balance training at [facility] 08/2010) ?CMT   Influenza A 09/15/2018   Iron deficiency anemia    LLL pneumonia 10/05/2018   Right ankle pain 2003   trimalleolar fx s/p fusion   Seizure (HCC)    focal, ex vacuo ventriculomegaly?, pending neuro w/u with sleep deprived EEG   Syncope and collapse 08/19/2011    Past Surgical History:  Procedure Laterality Date   ANKLE FUSION  02/2004   Right-50% disability    BRONCHIAL WASHINGS  03/04/2022   Procedure: BRONCHIAL WASHINGS;  Surgeon: Maire Scot, MD;  Location: [facility];  Service: Endoscopy;;   CARPAL TUNNEL RELEASE  12/2017   L CTR repair, R thumb A1 pulley release (Gramig)   CESAREAN SECTION     HAMMER TOE SURGERY  09/12/2019   hospitalization  08/2011   syncope thought 2/2 seizure, MRI - Hyperintensity in the cerebral white matter bilaterally is nonspecific, started on Keppra    ORIF ANKLE FRACTURE  06/2003   Right   VIDEO BRONCHOSCOPY Bilateral 03/04/2022   Procedure: VIDEO BRONCHOSCOPY WITHOUT FLUORO;  Surgeon: Maire Scot, MD;  Location: [facility];  Service: Endoscopy;  Laterality: Bilateral;    Family History  Problem Relation Age of Onset   Depression Mother  Arrhythmia Mother 89       Dx with VTach after having sudden collapse, did NOT have h/o AMI's    Atrial fibrillation Father    Hypertension Father    Osteoarthritis Father    Aortic aneurysm Father    Hepatitis Brother        Hep. C   Anemia Sister     Social History   Occupational History   Occupation: Disabled    Comment: depression   Occupation: DISABILITY    Employer: UNEMPLOYED  Tobacco Use   Smoking status: Former    Current packs/day: 0.00    Average packs/day: 1 pack/day for 20.0 years (20.0 ttl pk-yrs)    Types: Cigarettes    Start date: 08/12/1975    Quit date: 08/12/1995    Years since quitting: 28.3   Smokeless tobacco: Never  Vaping Use   Vaping status: Never Used   Substance and Sexual Activity   Alcohol use: No    Alcohol/week: 0.0 standard drinks of alcohol    Comment: occassinal   Drug use: No   Sexual activity: Not on file     Physical Exam: Blood pressure 126/64, pulse 82, height 5\' 5"  (1.651 m), weight 217 lb 6.4 oz (98.6 kg), last menstrual period 06/01/2013, SpO2 97%.  Gen:      No acute distress, frequent coughing Lungs:  ctab no wheezes or crackles CV:        RRR no mrg   Data Reviewed: Imaging: CT Chest July 2023 reviewed - multifocal tree in bud opacities consistent with infection.  CT sinuses July 2023 - severe pansinusitis PFTs:      Latest Ref Rng & Units 01/14/2021    9:56 AM  PFT Results  FVC-Pre L 2.90   FVC-Predicted Pre % 78   FVC-Post L 2.72   FVC-Predicted Post % 73   Pre FEV1/FVC % % 72   Post FEV1/FCV % % 71   FEV1-Pre L 2.09   FEV1-Predicted Pre % 72   FEV1-Post L 1.94   DLCO uncorrected ml/min/mmHg 20.62   DLCO UNC% % 91   DLCO corrected ml/min/mmHg 20.62   DLCO COR %Predicted % 91   DLVA Predicted % 98   TLC L 5.80   TLC % Predicted % 105   RV % Predicted % 123    I have personally reviewed the patient's PFTs and they show normal pulmonary function.   Labs: Lab Results  Component Value Date   WBC 7.1 05/06/2023   HGB 14.3 05/06/2023   HCT 43.4 05/06/2023   MCV 98.7 05/06/2023   PLT 327.0 05/06/2023   Lab Results  Component Value Date   NA 137 05/06/2023   K 4.1 05/06/2023   CO2 26 05/06/2023   GLUCOSE 92 05/06/2023   BUN 11 05/06/2023   CREATININE 0.73 05/06/2023   CALCIUM  9.8 05/06/2023   GFR 88.78 05/06/2023   GFRNONAA >60 03/05/2022   Micro cultures reviewed from bronchoscopy 2023 - afb negative, bronch cultures showed h flu and mrsa.   Immunization status: Immunization History  Administered Date(s) Administered   Influenza Split 08/19/2012   Influenza, Seasonal, Injecte, Preservative Fre 05/06/2023   Influenza,inj,Quad PF,6+ Mos 05/04/2015, 09/04/2018, 04/07/2019,  08/14/2020, 05/01/2022   PFIZER(Purple Top)SARS-COV-2 Vaccination 10/31/2019, 11/21/2019   Pneumococcal Conjugate-13 05/04/2015   Tdap 08/28/2011   Zoster Recombinant(Shingrix) 04/13/2020    Assessment:  Chronic cough with abnormal chest imaging  Bronchiectasis, mucus plugging, tree in bud changes.  Severe pansinusitis Recurrent hemoptysis (last in  2024)  Plan/Recommendations:  Changes in your lungs are concerning for atypical chronic infection. We need to check some sputum samples to see if we can capture the bacteria growing. Concern for MAI infection.    Will order dedicated high resolution CT Chest to further exam lungs.   Sputum sample instructions given today.   Continue prn albuterol .   May need more aggressive sinus disease treatment and re-referral to ENT in future.   Return to Care: Return in about 2 months (around 02/14/2024).   Louie Rover, MD Pulmonary and Critical Care Medicine [facility] Office:[phone]

## 2023-12-18 NOTE — Progress Notes (Signed)
 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   Trimalleolar fracture on right s/p fusion     Benign essential tremor    worsened by anxiety   Chronic pain due to injury    at R ankle due to ankle fracture   Depression    followed by psych   GERD (gastroesophageal reflux disease)    Hemoptysis 03/03/2022   History of hypertension    HLD (hyperlipidemia)    Imbalance    likely multifactorial (s/p balance training at [facility] 08/2010) ?CMT   Influenza A 09/15/2018   Iron deficiency anemia    LLL pneumonia 10/05/2018   Right ankle pain 2003   trimalleolar fx s/p fusion   Seizure (HCC)    focal, ex vacuo ventriculomegaly?, pending neuro w/u with sleep deprived EEG   Syncope and collapse 08/19/2011    History of Present Illness  Jamie Patterson is a 63 y.o. female with a PMH of HTN, HLD former tobacco abuse quit 1997, hypothyroid, benign tremors who presents today for 17-month follow-up.  [name] was seen initially by Dr. Emmer on 10/07/2023 by referral of her PCP due to elevated CAD risk. She was noted to have labile BP with elevated heart rate on carvedilol  and endorsed no chest pain and mild exertional dyspnea on current visit.  During her visit she was continued on ARB and Coreg  was increase over a 2-week period for persistent elevated heart rate.  She also completed a coronary calcium  score with overall calcium  score of 14.2 coronary calcium  present in LAD with advisement to continue heart healthy lifestyle and risk factor modifications.  She also had some changes noted in her lungs and followed up with her pulmonologist.  [name] presents today for 67-month follow-up.  During today's visit her blood pressure was  elevated at 150/110 and was still elevated on recheck at 148/96. She experiences labile blood pressure with episodes of both high and low readings per her home log.  Her blood pressure has been fluctuating significantly, with a recent low reading of 79/52 mmHg on the 7th of this month, which made her feel poorly. She also experienced a high reading of 161/106 mmHg on the 16th, followed by a low of 80/54 mmHg on the 18th, and then a return to 123/89 mmHg. She checks her blood pressure sporadically, usually once a day, but more frequently when she feels faint or lightheaded. Her blood pressure tends to normalize by the evening on days it drops. She takes carvedilol  and a statin twice daily, with the carvedilol  dose taken in the morning and again at 7 or 8 PM. She is also on Benicar  for blood pressure management. Her heart rate has improved with the current medication regimen. She experiences chronic pain due to a fused ankle, which limits her physical activity. She does not engage in regular exercise and experiences pain when walking, especially on uneven surfaces. She occasionally cares for her grandchildren but does not engage in other physical activities due to her ankle pain. She has a history of anxiety and depression, for which she has been on medication for many years. She sees a psychiatrist, Dr. Kirt, for medication management. She feels anxious most of  the time, which she believes may affect her blood pressure. She has not been seeing a therapist since her previous psychologist retired. Her family history includes her father having atrial fibrillation and her mother passing away at 51 from suspected ventricular tachycardia. Her mother was medicated for this condition. Patient denies chest pain, palpitations, dyspnea, PND, orthopnea, nausea, vomiting, dizziness, syncope, edema, weight gain, or early satiety.  Discussed the use of AI scribe software for clinical note transcription with the patient, who gave  verbal consent to proceed.  History of Present Illness   Review of Systems  Please see the history of present illness.    All other systems reviewed and are otherwise negative except as noted above.  Physical Exam     Wt Readings from Last 3 Encounters:  12/22/23 210 lb (95.3 kg)  12/15/23 217 lb 6.4 oz (98.6 kg)  10/07/23 215 lb 12.8 oz (97.9 kg)   VS: Vitals:   12/22/23 0828 12/22/23 0859  BP: (!) 150/110 (!) 148/96  Pulse: 94   SpO2: 95%   ,Body mass index is 34.95 kg/m. GEN: Well nourished, well developed in no acute distress Neck: No JVD; No carotid bruits Pulmonary: Clear to auscultation without rales, wheezing or rhonchi  Cardiovascular: Normal rate. Regular rhythm. Normal S1. Normal S2.   Murmurs: There is no murmur.  ABDOMEN: Soft, non-tender, non-distended EXTREMITIES:  No edema; No deformity   EKG/LABS/ Recent Cardiac Studies   ECG personally reviewed by me today -none completed today  Risk Assessment/Calculations:          Lab Results  Component Value Date   WBC 7.1 05/06/2023   HGB 14.3 05/06/2023   HCT 43.4 05/06/2023   MCV 98.7 05/06/2023   PLT 327.0 05/06/2023   Lab Results  Component Value Date   CREATININE 0.73 05/06/2023   BUN 11 05/06/2023   NA 137 05/06/2023   K 4.1 05/06/2023   CL 100 05/06/2023   CO2 26 05/06/2023   Lab Results  Component Value Date   CHOL 242 (H) 10/22/2023   HDL 78 10/22/2023   LDLCALC 148 (H) 10/22/2023   LDLDIRECT 90.0 03/14/2021   TRIG 93 10/22/2023   CHOLHDL 3.1 10/22/2023    Lab Results  Component Value Date   HGBA1C 5.8 05/06/2023   Assessment & Plan    Assessment and Plan Assessment & Plan Labile Hypertension Blood pressure fluctuates between hypotension and hypertension. Carvedilol  effective for heart rate. Anxiety may influence fluctuations. No medication changes to prevent hypotension. - Refer to hypertension clinic for evaluation and management. - Monitor blood pressure three times daily  for two weeks, document readings, symptoms, and stressors. - Order renal ultrasound to evaluate for secondary hypertension.  Anxiety Disorder Chronic anxiety may affect blood pressure. Managed by psychiatrist. Lack of physical activity and social interactions may worsen anxiety. - Encourage physical activities like pool exercises or rowing machine. - Suggest finding a new therapist for ongoing support.  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 Cortisone injections used for pain management. - Encourage low-impact exercises like pool exercises or rowing machine.  Depression Chronic depression managed by psychiatrist. May reduce physical activity and increase sleep. - Encourage physical activities like pool exercises or rowing machine.  Chronic Cough Chronic cough persists despite Protonix , GERD unlikely. Pulmonologist evaluation ongoing. - Continue follow-up with pulmonologist.    1.  Coronary calcifications: - CT calcium  scoring completed 10/16/2023 showing calcium  score of 14.2 which was 74 1st percentile -  Today patient reports no chest pain or side effects with new dose of Crestor . - Continue current GDMT with Crestor  40 mg  2.  Hyperlipidemia: - Calcium  score of 14.2 on CT with Crestor  increased to 40 mg daily - Patient reports no myalgias with new current dose.   3.  Essential hypertension: -HYPERTENSION CONTROL Vitals:   12/22/23 0828 12/22/23 0859  BP: (!) 150/110 (!) 148/96    The patient's blood pressure is elevated above target today.  In order to address the patient's elevated BP: Blood pressure will be monitored at home to determine if medication changes need to be made.; A referral to the [facility]pert[facility] will be placed.     -Blood pressure fluctuates between hypotension and hypertension. Carvedilol  effective for heart rate. Anxiety may influence fluctuations. No medication changes to prevent hypotension. - Refer  to hypertension clinic for evaluation and management. - Monitor blood pressure three times daily for two weeks, document readings, symptoms, and stressors. - Order renal ultrasound to evaluate for secondary hypertension. - Continue Coreg  25 mg twice daily and Benicar  40 mg daily  4.  Sinus tachycardia: - Heart rate fairly controlled at home ranging in the 60s to 90s. - Patient will continue Coreg  25 mg twice daily  5.Anxiety Disorder: -Chronic anxiety may affect blood pressure. Managed by psychiatrist. Lack of physical activity and social interactions may worsen anxiety. - Encourage physical activities like pool exercises or rowing machine. - Suggest finding a new therapist for ongoing support.  Disposition: Follow-up with Maude Emmer, MD or APP in 3 months    Signed, Wyn Raddle, Jackee Shove, NP 12/22/2023, 10:10 AM [facility] "

## 2023-12-22 ENCOUNTER — Encounter: Payer: Self-pay | Admitting: Nurse Practitioner

## 2023-12-22 ENCOUNTER — Ambulatory Visit: Attending: Nurse Practitioner | Admitting: Nurse Practitioner

## 2023-12-22 VITALS — BP 148/96 | HR 94 | Ht 65.0 in | Wt 210.0 lb

## 2023-12-22 DIAGNOSIS — E7800 Pure hypercholesterolemia, unspecified: Secondary | ICD-10-CM

## 2023-12-22 DIAGNOSIS — R000 Tachycardia, unspecified: Secondary | ICD-10-CM | POA: Diagnosis not present

## 2023-12-22 DIAGNOSIS — I10 Essential (primary) hypertension: Secondary | ICD-10-CM

## 2023-12-22 DIAGNOSIS — R931 Abnormal findings on diagnostic imaging of heart and coronary circulation: Secondary | ICD-10-CM | POA: Diagnosis not present

## 2023-12-22 NOTE — Patient Instructions (Signed)
 Medication Instructions:  Your physician recommends that you continue on your current medications as directed. Please refer to the Current Medication list given to you today. *If you need a refill on your cardiac medications before your next appointment, please call your pharmacy*  Lab Work: None ordered If you have labs (blood work) drawn today and your tests are completely normal, you will receive your results only by: MyChart Message (if you have MyChart) OR A paper copy in the mail If you have any lab test that is abnormal or we need to change your treatment, we will call you to review the results.  Testing/Procedures: Your physician has requested that you have a renal artery duplex. During this test, an ultrasound is used to evaluate blood flow to the kidneys. Allow one hour for this exam. Do not eat after midnight the day before and avoid carbonated beverages. Take your medications as you usually do.   Follow-Up: At [facility], you and your health needs are our priority.  As part of our continuing mission to provide you with exceptional heart care, our providers are all part of one team.  This team includes your primary Cardiologist (physician) and Advanced Practice Providers or APPs (Physician Assistants and Nurse Practitioners) who all work together to provide you with the care you need, when you need it.  Your next appointment:   3 month(s)  Provider:   Charles Connor, NP or any APP  We recommend signing up for the patient portal called "MyChart".  Sign up information is provided on this After Visit Summary.  MyChart is used to connect with patients for Virtual Visits (Telemedicine).  Patients are able to view lab/test results, encounter notes, upcoming appointments, etc.  Non-urgent messages can be sent to your provider as well.   To learn more about what you can do with MyChart, go to [email or web address].   Other Instructions You have been referred to Hypertension  Clinic  Check your blood pressure three times a day for 2 weeks, then contact the office with your readings.  Contact the office either by phone or MyChart with your readings.  Make sure to check your blood pressure 2 hours after taking your medications.   AVOID these things for 30 minutes before checking your blood pressure: No Drinking caffeine. No Drinking alcohol. No Eating. No Smoking. No Exercising.  Five minutes before checking your blood pressure: Pee. Sit in a dining chair. Avoid sitting in a soft couch or armchair. Be quiet. Do not talk.

## 2023-12-25 ENCOUNTER — Ambulatory Visit

## 2024-01-13 ENCOUNTER — Ambulatory Visit (HOSPITAL_COMMUNITY)
Admission: RE | Admit: 2024-01-13 | Discharge: 2024-01-13 | Disposition: A | Discharge status: Home / Self Care | Source: Ambulatory Visit | Attending: Nurse Practitioner | Admitting: Nurse Practitioner

## 2024-01-13 DIAGNOSIS — R931 Abnormal findings on diagnostic imaging of heart and coronary circulation: Secondary | ICD-10-CM | POA: Diagnosis not present

## 2024-01-13 DIAGNOSIS — R000 Tachycardia, unspecified: Secondary | ICD-10-CM | POA: Diagnosis not present

## 2024-01-13 DIAGNOSIS — E7800 Pure hypercholesterolemia, unspecified: Secondary | ICD-10-CM | POA: Diagnosis not present

## 2024-01-13 DIAGNOSIS — I10 Essential (primary) hypertension: Secondary | ICD-10-CM | POA: Diagnosis not present

## 2024-01-15 ENCOUNTER — Ambulatory Visit: Attending: Internal Medicine

## 2024-01-17 ENCOUNTER — Ambulatory Visit: Payer: Self-pay | Admitting: Nurse Practitioner

## 2024-02-09 DIAGNOSIS — J15212 Pneumonia due to Methicillin resistant Staphylococcus aureus: Secondary | ICD-10-CM

## 2024-02-09 HISTORY — DX: Pneumonia due to methicillin resistant Staphylococcus aureus: J15.212

## 2024-02-10 ENCOUNTER — Ambulatory Visit: Admitting: Pharmacist

## 2024-02-10 NOTE — Progress Notes (Deleted)
 Patient ID: Jamie Patterson                 DOB: 1960-12-11                      MRN: [id number]      HPI: Jamie Patterson is a 63 y.o. female referred by Jackee Alberts, NP to HTN clinic. PMH is significant for labile HTN, anxirty/depression, CAC 14 (2025), HLD. Seen by Jackee in May 2025. Blood pressure was elevated in clinic. At home, her BP fluctuates between hypo and hypertension.   Certain time of day for highs or lows? Anxiety when high?  Started any exercise? Therapy? HR?   Current HTN meds: olmesartan  40mg  daily, carvedilol  25mg  twice a day Previously tried:  BP goal:   Family History:   Social History:   Diet:   Exercise:  {types:28256}  Home BP readings:  Date SBP/DBP  HR                              Average      Wt Readings from Last 3 Encounters:  12/22/23 210 lb (95.3 kg)  12/15/23 217 lb 6.4 oz (98.6 kg)  10/07/23 215 lb 12.8 oz (97.9 kg)   BP Readings from Last 3 Encounters:  12/22/23 (!) 148/96  12/15/23 126/64  10/07/23 (!) 148/90   Pulse Readings from Last 3 Encounters:  12/22/23 94  12/15/23 82  10/07/23 (!) 120    Renal function: CrCl cannot be calculated (Patient's most recent lab result is older than the maximum 21 days allowed.).  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   Trimalleolar fracture on right s/p fusion     Benign essential tremor    worsened by anxiety   Chronic pain due to injury    at R ankle due to ankle fracture   Depression    followed by psych   GERD (gastroesophageal reflux disease)    Hemoptysis 03/03/2022   History of hypertension    HLD (hyperlipidemia)    Imbalance    likely multifactorial (s/p balance training at [facility] 08/2010) ?CMT   Influenza A 09/15/2018   Iron deficiency anemia    LLL pneumonia 10/05/2018   Right ankle pain 2003   trimalleolar fx s/p fusion   Seizure (HCC)    focal, ex vacuo ventriculomegaly?, pending neuro w/u with sleep deprived  EEG   Syncope and collapse 08/19/2011    Current Outpatient Medications on File Prior to Visit  Medication Sig Dispense Refill   acetaminophen  (TYLENOL ) 325 MG tablet Take 2 tablets (650 mg total) by mouth every 6 (six) hours as needed for mild pain (or Fever >/= 101). 20 tablet 0   albuterol  (VENTOLIN  HFA) 108 (90 Base) MCG/ACT inhaler INHALE TWO PUFFS INTO THE LUNGS EVERY SIX HOURS AS NEEDED FOR WHEEZING OR SHORTNESS OF BREATH. 8.5 g 0   Ascorbic Acid (VITAMIN C) 1000 MG tablet Take 1,000 mg by mouth daily.     carvedilol  (COREG ) 25 MG tablet Take 1 tablet (25 mg total) by mouth 2 (two) times daily with a meal. 180 tablet 3   cholecalciferol  (VITAMIN D3) 25 MCG (1000 UNIT) tablet Take 1,000 Units by mouth daily.     cycloSPORINE  (RESTASIS ) 0.05 % ophthalmic emulsion Place 1 drop into both eyes 2 (two) times daily.     DULoxetine  (CYMBALTA ) 60  MG capsule Take 60 mg by mouth 2 (two) times daily.      escitalopram  (LEXAPRO ) 10 MG tablet Take 10 mg by mouth daily.      ferrous sulfate  325 (65 FE) MG tablet Take 325 mg by mouth daily with breakfast.     gabapentin  (NEURONTIN ) 300 MG capsule Take 300-1,200 mg by mouth See admin instructions. Take 1 capsule four times a day, then take 4 capsules at bedtime     lamoTRIgine  (LAMICTAL ) 150 MG tablet TAKE 1 TABLET BY MOUTH TWICE A DAY 180 tablet 1   levothyroxine  (SYNTHROID ) 50 MCG tablet TAKE ONE TABLET BY MOUTH ONCE A DAY. TAKE ON AN EMPTY STOMACH WITH A GLASS OF WATER ATLEAST 30-60 MIN BEFORE BREAKFAST 90 tablet 3   MAGNESIUM PO Take 400 mg by mouth 3 (three) times daily.      Melatonin 10 MG TABS Take 2 tablets by mouth at bedtime.      meloxicam (MOBIC) 15 MG tablet Take 15 mg by mouth daily.     Multiple Vitamin (MULTIVITAMIN) tablet Take 1 tablet by mouth daily.     naproxen  (NAPROSYN ) 500 MG tablet Take 1 tablet (500 mg total) by mouth 2 (two) times daily. 30 tablet 0   olmesartan  (BENICAR ) 40 MG tablet Take 1 tablet (40 mg total) by mouth  daily. for blood pressure. 90 tablet 2   primidone  (MYSOLINE ) 50 MG tablet Take 1 tablet (50 mg total) by mouth 2 (two) times daily. 180 tablet 3   rosuvastatin  (CRESTOR ) 40 MG tablet Take 1 tablet (40 mg total) by mouth daily. 90 tablet 3   tiZANidine  (ZANAFLEX ) 4 MG tablet Take 8 mg by mouth at bedtime.     vitamin B-12 (CYANOCOBALAMIN ) 1000 MCG tablet Take 1,000 mcg by mouth daily.     No current facility-administered medications on file prior to visit.    Allergies  Allergen Reactions   Amlodipine  Swelling    Ankle edema    Last menstrual period 06/01/2013.   Assessment/Plan: No BP recorded.  {Refresh Note OR Click here to enter BP  :1}***   1. Hypertension -  No problem-specific Assessment & Plan notes found for this encounter.      Thank you  Draeden Kellman D Lanesha Azzaro, Pharm.Jamie Patterson, CPP [facility] A Division of [facility] [address], [place], [place] [address]  Phone: [phone]; Fax: [phone]

## 2024-02-26 ENCOUNTER — Encounter (HOSPITAL_BASED_OUTPATIENT_CLINIC_OR_DEPARTMENT_OTHER): Payer: Self-pay

## 2024-02-26 ENCOUNTER — Other Ambulatory Visit: Payer: Self-pay

## 2024-02-26 ENCOUNTER — Inpatient Hospital Stay (HOSPITAL_BASED_OUTPATIENT_CLINIC_OR_DEPARTMENT_OTHER)
Admission: EM | Admit: 2024-02-26 | Discharge: 2024-03-02 | DRG: 193 | Disposition: A | Discharge status: Home / Self Care | Attending: Internal Medicine | Admitting: Internal Medicine

## 2024-02-26 ENCOUNTER — Emergency Department (HOSPITAL_BASED_OUTPATIENT_CLINIC_OR_DEPARTMENT_OTHER)

## 2024-02-26 DIAGNOSIS — E039 Hypothyroidism, unspecified: Secondary | ICD-10-CM | POA: Diagnosis present

## 2024-02-26 DIAGNOSIS — J189 Pneumonia, unspecified organism: Principal | ICD-10-CM

## 2024-02-26 DIAGNOSIS — Z8261 Family history of arthritis: Secondary | ICD-10-CM

## 2024-02-26 DIAGNOSIS — R918 Other nonspecific abnormal finding of lung field: Secondary | ICD-10-CM | POA: Diagnosis not present

## 2024-02-26 DIAGNOSIS — Z832 Family history of diseases of the blood and blood-forming organs and certain disorders involving the immune mechanism: Secondary | ICD-10-CM | POA: Diagnosis not present

## 2024-02-26 DIAGNOSIS — G250 Essential tremor: Secondary | ICD-10-CM | POA: Diagnosis present

## 2024-02-26 DIAGNOSIS — Z79899 Other long term (current) drug therapy: Secondary | ICD-10-CM | POA: Diagnosis not present

## 2024-02-26 DIAGNOSIS — F331 Major depressive disorder, recurrent, moderate: Secondary | ICD-10-CM | POA: Diagnosis not present

## 2024-02-26 DIAGNOSIS — Z8614 Personal history of Methicillin resistant Staphylococcus aureus infection: Secondary | ICD-10-CM | POA: Diagnosis not present

## 2024-02-26 DIAGNOSIS — Z818 Family history of other mental and behavioral disorders: Secondary | ICD-10-CM

## 2024-02-26 DIAGNOSIS — G629 Polyneuropathy, unspecified: Secondary | ICD-10-CM | POA: Diagnosis present

## 2024-02-26 DIAGNOSIS — I10 Essential (primary) hypertension: Secondary | ICD-10-CM | POA: Diagnosis present

## 2024-02-26 DIAGNOSIS — K8020 Calculus of gallbladder without cholecystitis without obstruction: Secondary | ICD-10-CM | POA: Diagnosis present

## 2024-02-26 DIAGNOSIS — R0602 Shortness of breath: Secondary | ICD-10-CM | POA: Diagnosis not present

## 2024-02-26 DIAGNOSIS — G8929 Other chronic pain: Secondary | ICD-10-CM | POA: Diagnosis present

## 2024-02-26 DIAGNOSIS — Z87891 Personal history of nicotine dependence: Secondary | ICD-10-CM

## 2024-02-26 DIAGNOSIS — Z791 Long term (current) use of non-steroidal anti-inflammatories (NSAID): Secondary | ICD-10-CM

## 2024-02-26 DIAGNOSIS — J168 Pneumonia due to other specified infectious organisms: Secondary | ICD-10-CM | POA: Diagnosis not present

## 2024-02-26 DIAGNOSIS — R1011 Right upper quadrant pain: Secondary | ICD-10-CM

## 2024-02-26 DIAGNOSIS — F419 Anxiety disorder, unspecified: Secondary | ICD-10-CM | POA: Diagnosis present

## 2024-02-26 DIAGNOSIS — Z79890 Hormone replacement therapy (postmenopausal): Secondary | ICD-10-CM

## 2024-02-26 DIAGNOSIS — Z1152 Encounter for screening for COVID-19: Secondary | ICD-10-CM

## 2024-02-26 DIAGNOSIS — Z6834 Body mass index (BMI) 34.0-34.9, adult: Secondary | ICD-10-CM

## 2024-02-26 DIAGNOSIS — E871 Hypo-osmolality and hyponatremia: Secondary | ICD-10-CM | POA: Diagnosis present

## 2024-02-26 DIAGNOSIS — J9601 Acute respiratory failure with hypoxia: Secondary | ICD-10-CM | POA: Diagnosis present

## 2024-02-26 DIAGNOSIS — J90 Pleural effusion, not elsewhere classified: Secondary | ICD-10-CM | POA: Diagnosis not present

## 2024-02-26 DIAGNOSIS — J181 Lobar pneumonia, unspecified organism: Principal | ICD-10-CM | POA: Diagnosis present

## 2024-02-26 DIAGNOSIS — K828 Other specified diseases of gallbladder: Secondary | ICD-10-CM | POA: Diagnosis not present

## 2024-02-26 DIAGNOSIS — J849 Interstitial pulmonary disease, unspecified: Secondary | ICD-10-CM | POA: Diagnosis not present

## 2024-02-26 DIAGNOSIS — Z8249 Family history of ischemic heart disease and other diseases of the circulatory system: Secondary | ICD-10-CM

## 2024-02-26 DIAGNOSIS — E66811 Obesity, class 1: Secondary | ICD-10-CM | POA: Diagnosis present

## 2024-02-26 DIAGNOSIS — Z888 Allergy status to other drugs, medicaments and biological substances status: Secondary | ICD-10-CM | POA: Diagnosis not present

## 2024-02-26 DIAGNOSIS — R932 Abnormal findings on diagnostic imaging of liver and biliary tract: Secondary | ICD-10-CM | POA: Diagnosis not present

## 2024-02-26 DIAGNOSIS — F32A Depression, unspecified: Secondary | ICD-10-CM | POA: Diagnosis present

## 2024-02-26 DIAGNOSIS — J1289 Other viral pneumonia: Secondary | ICD-10-CM | POA: Diagnosis not present

## 2024-02-26 DIAGNOSIS — E785 Hyperlipidemia, unspecified: Secondary | ICD-10-CM | POA: Diagnosis present

## 2024-02-26 DIAGNOSIS — R079 Chest pain, unspecified: Secondary | ICD-10-CM | POA: Diagnosis not present

## 2024-02-26 DIAGNOSIS — R296 Repeated falls: Secondary | ICD-10-CM | POA: Diagnosis present

## 2024-02-26 DIAGNOSIS — R109 Unspecified abdominal pain: Secondary | ICD-10-CM | POA: Diagnosis not present

## 2024-02-26 LAB — COMPREHENSIVE METABOLIC PANEL WITH GFR
ALT: 20 U/L (ref 0–44)
AST: 23 U/L (ref 15–41)
Albumin: 4.5 g/dL (ref 3.5–5.0)
Alkaline Phosphatase: 136 U/L — ABNORMAL HIGH (ref 38–126)
Anion gap: 14 (ref 5–15)
BUN: 9 mg/dL (ref 8–23)
CO2: 24 mmol/L (ref 22–32)
Calcium: 9.9 mg/dL (ref 8.9–10.3)
Chloride: 95 mmol/L — ABNORMAL LOW (ref 98–111)
Creatinine, Ser: 0.74 mg/dL (ref 0.44–1.00)
GFR, Estimated: >60 mL/min (ref >60)
Glucose, Bld: 136 mg/dL — ABNORMAL HIGH (ref 70–99)
Potassium: 4.9 mmol/L (ref 3.5–5.1)
Sodium: 133 mmol/L — ABNORMAL LOW (ref 135–145)
Total Bilirubin: 0.6 mg/dL (ref 0.0–1.2)
Total Protein: 7 g/dL (ref 6.5–8.1)

## 2024-02-26 LAB — CBC WITH DIFFERENTIAL/PLATELET
Abs Immature Granulocytes: 0.04 K/uL (ref 0.00–0.07)
Basophils Absolute: 0 K/uL (ref 0.0–0.1)
Basophils Relative: 0 %
Eosinophils Absolute: 0.1 K/uL (ref 0.0–0.5)
Eosinophils Relative: 0 %
HCT: 38 % (ref 36.0–46.0)
Hemoglobin: 12.8 g/dL (ref 12.0–15.0)
Immature Granulocytes: 0 %
Lymphocytes Relative: 13 %
Lymphs Abs: 1.7 K/uL (ref 0.7–4.0)
MCH: 33.3 pg (ref 26.0–34.0)
MCHC: 33.7 g/dL (ref 30.0–36.0)
MCV: 99 fL (ref 80.0–100.0)
Monocytes Absolute: 1.6 K/uL — ABNORMAL HIGH (ref 0.1–1.0)
Monocytes Relative: 12 %
Neutro Abs: 9.8 K/uL — ABNORMAL HIGH (ref 1.7–7.7)
Neutrophils Relative %: 75 %
Platelets: 227 K/uL (ref 150–400)
RBC: 3.84 MIL/uL — ABNORMAL LOW (ref 3.87–5.11)
RDW: 12.7 % (ref 11.5–15.5)
WBC: 13.1 K/uL — ABNORMAL HIGH (ref 4.0–10.5)
nRBC: 0 % (ref 0.0–0.2)

## 2024-02-26 LAB — LIPASE, BLOOD: Lipase: <10 U/L — ABNORMAL LOW (ref 11–51)

## 2024-02-26 MED ORDER — SODIUM CHLORIDE 0.9 % IV SOLN
500.0000 mg | Freq: Once | INTRAVENOUS | Status: AC
  Administered 2024-02-27: 500 mg via INTRAVENOUS
  Filled 2024-02-26: qty 5

## 2024-02-26 MED ORDER — HYDROMORPHONE HCL 1 MG/ML IJ SOLN
0.5000 mg | Freq: Once | INTRAMUSCULAR | Status: AC
  Administered 2024-02-27: 0.5 mg via INTRAVENOUS
  Filled 2024-02-26: qty 1

## 2024-02-26 MED ORDER — KETOROLAC TROMETHAMINE 15 MG/ML IJ SOLN
15.0000 mg | Freq: Once | INTRAMUSCULAR | Status: AC
  Administered 2024-02-27: 15 mg via INTRAVENOUS
  Filled 2024-02-26: qty 1

## 2024-02-26 MED ORDER — IOHEXOL 300 MG/ML  SOLN
100.0000 mL | Freq: Once | INTRAMUSCULAR | Status: AC | PRN
  Administered 2024-02-26: 100 mL via INTRAVENOUS

## 2024-02-26 MED ORDER — AZITHROMYCIN 250 MG PO TABS: 250.0000 mg | ORAL_TABLET | Freq: Every day | ORAL | 0 refills | Status: DC

## 2024-02-26 MED ORDER — ONDANSETRON HCL 4 MG/2ML IJ SOLN
4.0000 mg | Freq: Once | INTRAMUSCULAR | Status: AC
  Administered 2024-02-26: 4 mg via INTRAVENOUS
  Filled 2024-02-26: qty 2

## 2024-02-26 MED ORDER — SODIUM CHLORIDE 0.9 % IV BOLUS
1000.0000 mL | Freq: Once | INTRAVENOUS | Status: AC
  Administered 2024-02-26: 1000 mL via INTRAVENOUS

## 2024-02-26 MED ORDER — SODIUM CHLORIDE 0.9 % IV SOLN
1.0000 g | Freq: Once | INTRAVENOUS | Status: AC
  Administered 2024-02-26: 1 g via INTRAVENOUS
  Filled 2024-02-26: qty 10

## 2024-02-26 MED ORDER — MORPHINE SULFATE (PF) 4 MG/ML IV SOLN
4.0000 mg | Freq: Once | INTRAVENOUS | Status: AC
  Administered 2024-02-26: 4 mg via INTRAVENOUS
  Filled 2024-02-26: qty 1

## 2024-02-26 MED ORDER — AMOXICILLIN-POT CLAVULANATE 875-125 MG PO TABS: 1.0000 | ORAL_TABLET | Freq: Two times a day (BID) | ORAL | 0 refills | Status: DC

## 2024-02-26 MED ORDER — ALBUTEROL SULFATE HFA 108 (90 BASE) MCG/ACT IN AERS
1.0000 | INHALATION_SPRAY | Freq: Once | RESPIRATORY_TRACT | Status: AC
Start: 2024-02-26 — End: 2024-02-26
  Administered 2024-02-26: 1 via RESPIRATORY_TRACT
  Filled 2024-02-26: qty 6.7

## 2024-02-26 NOTE — ED Provider Notes (Signed)
 [facility] AT [facility] Provider Note   CSN: [id number] Arrival date & time: 02/26/24  2137     Patient presents with: Abdominal Pain   Jamie Patterson is a 63 y.o. female.  With past medical history of chronic cough, hypertension, hyperlipidemia, history of hemoptysis presents to emergency room with complaint of right upper quadrant abdominal pain.  Patient reports that she started having mild right upper quadrant abdominal pain yesterday evening that acutely worsened throughout the day today.  This is associated with nausea and poor oral intake.  She had 1 episode of loose stool with this.  She denies any chest pain shortness of breath. She has not had fever. Notes chronic cough without significant worsening of symptoms. She still has her gallbladder.    Abdominal Pain      Prior to Admission medications   Medication Sig Start Date End Date Taking? Authorizing Provider  acetaminophen  (TYLENOL ) 325 MG tablet Take 2 tablets (650 mg total) by mouth every 6 (six) hours as needed for mild pain (or Fever >/= 101). 03/05/22   Sheikh, Omair Latif, DO  albuterol  (VENTOLIN  HFA) 108 (90 Base) MCG/ACT inhaler INHALE TWO PUFFS INTO THE LUNGS EVERY SIX HOURS AS NEEDED FOR WHEEZING OR SHORTNESS OF BREATH. 11/09/23   Clark, Katherine K, NP  Ascorbic Acid (VITAMIN C) 1000 MG tablet Take 1,000 mg by mouth daily.    [provider]  carvedilol  (COREG ) 25 MG tablet Take 1 tablet (25 mg total) by mouth 2 (two) times daily with a meal. 10/07/23   Delford Maude BROCKS, MD  cholecalciferol  (VITAMIN D3) 25 MCG (1000 UNIT) tablet Take 1,000 Units by mouth daily.    [provider]  cycloSPORINE  (RESTASIS ) 0.05 % ophthalmic emulsion Place 1 drop into both eyes 2 (two) times daily.    [provider]  DULoxetine  (CYMBALTA ) 60 MG capsule Take 60 mg by mouth 2 (two) times daily.     [provider]  escitalopram  (LEXAPRO ) 10 MG tablet Take 10 mg by mouth daily.   10/01/15   [provider]  ferrous sulfate  325 (65 FE) MG tablet Take 325 mg by mouth daily with breakfast.    [provider]  gabapentin  (NEURONTIN ) 300 MG capsule Take 300-1,200 mg by mouth See admin instructions. Take 1 capsule four times a day, then take 4 capsules at bedtime    [provider]  lamoTRIgine  (LAMICTAL ) 150 MG tablet TAKE 1 TABLET BY MOUTH TWICE A DAY 04/03/17   Tat, Rebecca S, DO  levothyroxine  (SYNTHROID ) 50 MCG tablet TAKE ONE TABLET BY MOUTH ONCE A DAY. TAKE ON AN EMPTY STOMACH WITH A GLASS OF WATER ATLEAST 30-60 MIN BEFORE BREAKFAST 05/22/23   Gretta Comer POUR, NP  MAGNESIUM PO Take 400 mg by mouth 3 (three) times daily.     [provider]  Melatonin 10 MG TABS Take 2 tablets by mouth at bedtime.     [provider]  meloxicam (MOBIC) 15 MG tablet Take 15 mg by mouth daily. 09/16/23   [provider]  Multiple Vitamin (MULTIVITAMIN) tablet Take 1 tablet by mouth daily.    [provider]  naproxen  (NAPROSYN ) 500 MG tablet Take 1 tablet (500 mg total) by mouth 2 (two) times daily. 02/06/23   Theadore Ozell HERO, MD  olmesartan  (BENICAR ) 40 MG tablet Take 1 tablet (40 mg total) by mouth daily. for blood pressure. 07/16/23   Gretta Comer POUR, NP  primidone  (MYSOLINE ) 50 MG tablet  Take 1 tablet (50 mg total) by mouth 2 (two) times daily. 04/14/23   Tat, Asberry RAMAN, DO  rosuvastatin  (CRESTOR ) 40 MG tablet Take 1 tablet (40 mg total) by mouth daily. 11/12/23   Delford Maude BROCKS, MD  tiZANidine  (ZANAFLEX ) 4 MG tablet Take 8 mg by mouth at bedtime. 09/24/21   [provider]  vitamin B-12 (CYANOCOBALAMIN ) 1000 MCG tablet Take 1,000 mcg by mouth daily.    [provider]    Allergies: Amlodipine     Review of Systems  Gastrointestinal:  Positive for abdominal pain.    Updated Vital Signs BP 125/87   Pulse 92   Temp 98.3 F (36.8 C) (Oral)   Resp (!) 22   Ht 5' 5 (1.651 m)   Wt 95.3 kg   LMP 06/01/2013    SpO2 92%   BMI 34.95 kg/m   Physical Exam Vitals and nursing note reviewed.  Constitutional:      General: She is not in acute distress.    Appearance: She is ill-appearing. She is not toxic-appearing.  HENT:     Head: Normocephalic and atraumatic.  Eyes:     General: No scleral icterus.    Conjunctiva/sclera: Conjunctivae normal.  Cardiovascular:     Rate and Rhythm: Normal rate and regular rhythm.     Pulses: Normal pulses.     Heart sounds: Normal heart sounds.  Pulmonary:     Effort: Pulmonary effort is normal. No respiratory distress.     Breath sounds: Rhonchi present.  Abdominal:     General: Abdomen is flat. Bowel sounds are normal.     Palpations: Abdomen is soft.     Tenderness: There is no abdominal tenderness.     Comments: Right upper quadrant tenderness to palpation.  Musculoskeletal:     Right lower leg: No edema.     Left lower leg: No edema.  Skin:    General: Skin is warm and dry.     Findings: No lesion.  Neurological:     General: No focal deficit present.     Mental Status: She is alert and oriented to person, place, and time. Mental status is at baseline.     (all labs ordered are listed, but only abnormal results are displayed) Labs Reviewed  CBC WITH DIFFERENTIAL/PLATELET - Abnormal; Notable for the following components:      Result Value   WBC 13.1 (*)    RBC 3.84 (*)    Neutro Abs 9.8 (*)    Monocytes Absolute 1.6 (*)    All other components within normal limits  COMPREHENSIVE METABOLIC PANEL WITH GFR - Abnormal; Notable for the following components:   Sodium 133 (*)    Chloride 95 (*)    Glucose, Bld 136 (*)    Alkaline Phosphatase 136 (*)    All other components within normal limits  LIPASE, BLOOD - Abnormal; Notable for the following components:   Lipase <10 (*)    All other components within normal limits  URINALYSIS, ROUTINE W REFLEX MICROSCOPIC    EKG: None  Radiology: No results found.   Procedures   Medications  Ordered in the ED  cefTRIAXone  (ROCEPHIN ) 1 g in sodium chloride  0.9 % 100 mL IVPB (1 g Intravenous New Bag/Given 02/26/24 2346)  azithromycin  (ZITHROMAX ) 500 mg in sodium chloride  0.9 % 250 mL IVPB (has no administration in time range)  HYDROmorphone  (DILAUDID ) injection 0.5 mg (has no administration in time range)  ketorolac  (TORADOL ) 15 MG/ML injection 15 mg (has  no administration in time range)  morphine  (PF) 4 MG/ML injection 4 mg (4 mg Intravenous Given 02/26/24 2224)  ondansetron  (ZOFRAN ) injection 4 mg (4 mg Intravenous Given 02/26/24 2224)  sodium chloride  0.9 % bolus 1,000 mL (1,000 mLs Intravenous New Bag/Given 02/26/24 2224)  iohexol  (OMNIPAQUE ) 300 MG/ML solution 100 mL (100 mLs Intravenous Contrast Given 02/26/24 2317)  albuterol  (VENTOLIN  HFA) 108 (90 Base) MCG/ACT inhaler 1 puff (1 puff Inhalation Given 02/26/24 2351)                                    Medical Decision Making Amount and/or Complexity of Data Reviewed Labs: ordered. Radiology: ordered.  Risk Prescription drug management.   This patient presents to the ED for concern of abdominal pain, this involves an extensive number of treatment options, and is a complaint that carries with it a high risk of complications and morbidity.  The differential diagnosis includes atypical ACS, cholecystitis, pancreatitis, appendicitis, gastroenteritis   Co morbidities that complicate the patient evaluation  Hypertension, hyperlipidemia   Additional history obtained:  Additional history obtained from [facility] visit 12/15/2023 for chronic cough -- has had history of pneumonitis in the past.    Lab Tests:  I personally interpreted labs.  The pertinent results include:  CBC with mild leukocytosis Alk Pos 136   Imaging Studies ordered:  I ordered imaging studies including CT abd/pelvis & CT chest w/o   I independently visualized and interpreted imaging which showed abd/pelvis without acute findings, does show gall  stone but no sign of inflammatory changes. Ct chest right middle lobe pneumonia.   I agree with the radiologist interpretation   Cardiac Monitoring: / EKG:  The patient was maintained on a cardiac monitor.    Problem List / ED Course / Critical interventions / Medication management  Patient reports with RUQ abdominal pain that has acutely worsening throughout the day. This is worse with movement and tender to the touch. Has notes some nausea. Labs show mild leukocytosis. No large bump in LFT or total bilirubin. On exam she is quite tender to RUQ and epigastric area which I will further characterize with CT scan to rule out acute findings. CT chest added due to abnormal appearance of lung bases (trace pleural effusion). CT chest with pneumonia Has had low 90s here, will ambulate with pulse ox and consider admission if sats drop. Will given first dose of antibiotic here. Requesting more pain medication for RUQ pain.  Ambulated with pulse ox and dropped to 84%, placed on 2L [place] will work on admission. Patient signed off to Dr Roselyn for admit.  I ordered medication including morphine , Zofran , normal saline Reevaluation of the patient after these medicines showed that the patient improved I have reviewed the patients home medicines and have made adjustments as needed        Final diagnoses:  Pneumonia of right middle lobe due to infectious organism  RUQ pain  Calculus of gallbladder without cholecystitis without obstruction    ED Discharge Orders     None          Shermon Warren SAILOR, PA-C 02/27/24 0010    Ruthe Cornet, DO 02/27/24 [phone]

## 2024-02-26 NOTE — ED Notes (Signed)
 Hot pack given by request to patient for pain. Provider notified of pain level

## 2024-02-26 NOTE — ED Notes (Signed)
 Patient transported to CT

## 2024-02-26 NOTE — ED Triage Notes (Signed)
 Pt reports RUQ abd pain since yesterday. Pt denies any N/V. Pt endorses diarrhea. Pt denies any history of kidney stones.

## 2024-02-26 NOTE — ED Notes (Signed)
 Provider at bedside

## 2024-02-26 NOTE — Discharge Instructions (Addendum)
 Please follow-up with your pulmonologist.  You will need to have a repeat imaging 4 to 6 weeks from now to make sure pneumonia has cleared with antibiotics.  Alternate Tylenol  and ibuprofen for pain control.  Use albuterol  inhaler as needed for wheezing or shortness of breath.  For abdominal pain, you can call general surgery if you continue to have right upper quand abdominal pain for gall stone, I have also sent pain medication and nausea medication to pharmacy.

## 2024-02-27 DIAGNOSIS — Z8261 Family history of arthritis: Secondary | ICD-10-CM | POA: Diagnosis not present

## 2024-02-27 DIAGNOSIS — Z6834 Body mass index (BMI) 34.0-34.9, adult: Secondary | ICD-10-CM | POA: Diagnosis not present

## 2024-02-27 DIAGNOSIS — J189 Pneumonia, unspecified organism: Secondary | ICD-10-CM | POA: Diagnosis not present

## 2024-02-27 DIAGNOSIS — I10 Essential (primary) hypertension: Secondary | ICD-10-CM | POA: Diagnosis present

## 2024-02-27 DIAGNOSIS — J9601 Acute respiratory failure with hypoxia: Secondary | ICD-10-CM | POA: Diagnosis present

## 2024-02-27 DIAGNOSIS — Z1152 Encounter for screening for COVID-19: Secondary | ICD-10-CM | POA: Diagnosis not present

## 2024-02-27 DIAGNOSIS — Z818 Family history of other mental and behavioral disorders: Secondary | ICD-10-CM | POA: Diagnosis not present

## 2024-02-27 DIAGNOSIS — Z832 Family history of diseases of the blood and blood-forming organs and certain disorders involving the immune mechanism: Secondary | ICD-10-CM | POA: Diagnosis not present

## 2024-02-27 DIAGNOSIS — Z87891 Personal history of nicotine dependence: Secondary | ICD-10-CM | POA: Diagnosis not present

## 2024-02-27 DIAGNOSIS — E871 Hypo-osmolality and hyponatremia: Secondary | ICD-10-CM | POA: Diagnosis present

## 2024-02-27 DIAGNOSIS — E785 Hyperlipidemia, unspecified: Secondary | ICD-10-CM | POA: Diagnosis present

## 2024-02-27 DIAGNOSIS — J181 Lobar pneumonia, unspecified organism: Secondary | ICD-10-CM | POA: Diagnosis present

## 2024-02-27 DIAGNOSIS — F32A Depression, unspecified: Secondary | ICD-10-CM | POA: Diagnosis present

## 2024-02-27 DIAGNOSIS — Z888 Allergy status to other drugs, medicaments and biological substances status: Secondary | ICD-10-CM | POA: Diagnosis not present

## 2024-02-27 DIAGNOSIS — Z79899 Other long term (current) drug therapy: Secondary | ICD-10-CM | POA: Diagnosis not present

## 2024-02-27 DIAGNOSIS — Z8249 Family history of ischemic heart disease and other diseases of the circulatory system: Secondary | ICD-10-CM | POA: Diagnosis not present

## 2024-02-27 DIAGNOSIS — K8020 Calculus of gallbladder without cholecystitis without obstruction: Secondary | ICD-10-CM | POA: Diagnosis present

## 2024-02-27 DIAGNOSIS — E039 Hypothyroidism, unspecified: Secondary | ICD-10-CM | POA: Diagnosis present

## 2024-02-27 DIAGNOSIS — G250 Essential tremor: Secondary | ICD-10-CM | POA: Diagnosis present

## 2024-02-27 DIAGNOSIS — Z8614 Personal history of Methicillin resistant Staphylococcus aureus infection: Secondary | ICD-10-CM | POA: Diagnosis not present

## 2024-02-27 DIAGNOSIS — R296 Repeated falls: Secondary | ICD-10-CM | POA: Diagnosis present

## 2024-02-27 DIAGNOSIS — G8929 Other chronic pain: Secondary | ICD-10-CM | POA: Diagnosis present

## 2024-02-27 DIAGNOSIS — E66811 Obesity, class 1: Secondary | ICD-10-CM | POA: Diagnosis present

## 2024-02-27 DIAGNOSIS — G629 Polyneuropathy, unspecified: Secondary | ICD-10-CM | POA: Diagnosis present

## 2024-02-27 DIAGNOSIS — Z79890 Hormone replacement therapy (postmenopausal): Secondary | ICD-10-CM | POA: Diagnosis not present

## 2024-02-27 DIAGNOSIS — R1011 Right upper quadrant pain: Secondary | ICD-10-CM | POA: Diagnosis present

## 2024-02-27 LAB — RESPIRATORY PANEL BY PCR

## 2024-02-27 LAB — CBC
HCT: 36.7 % (ref 36.0–46.0)
Hemoglobin: 12.2 g/dL (ref 12.0–15.0)
MCH: 33.2 pg (ref 26.0–34.0)
MCHC: 33.2 g/dL (ref 30.0–36.0)
MCV: 99.7 fL (ref 80.0–100.0)
Platelets: 229 K/uL (ref 150–400)
RBC: 3.68 MIL/uL — ABNORMAL LOW (ref 3.87–5.11)
RDW: 12.5 % (ref 11.5–15.5)
WBC: 12.1 K/uL — ABNORMAL HIGH (ref 4.0–10.5)
nRBC: 0 % (ref 0.0–0.2)

## 2024-02-27 LAB — HIV ANTIBODY (ROUTINE TESTING W REFLEX): HIV Screen 4th Generation wRfx: NONREACTIVE

## 2024-02-27 LAB — CREATININE, SERUM
Creatinine, Ser: 0.72 mg/dL (ref 0.44–1.00)
GFR, Estimated: >60 mL/min (ref >60)

## 2024-02-27 LAB — EXPECTORATED SPUTUM ASSESSMENT W GRAM STAIN, RFLX TO RESP C

## 2024-02-27 LAB — TSH: TSH: 7.879 u[IU]/mL — ABNORMAL HIGH (ref 0.350–4.500)

## 2024-02-27 LAB — LACTIC ACID, PLASMA: Lactic Acid, Venous: 1 mmol/L (ref 0.5–1.9)

## 2024-02-27 LAB — PROCALCITONIN: Procalcitonin: <0.1 ng/mL

## 2024-02-27 MED ORDER — HYDROMORPHONE HCL 1 MG/ML IJ SOLN
0.5000 mg | Freq: Once | INTRAMUSCULAR | Status: AC
  Administered 2024-02-27: 0.5 mg via INTRAVENOUS
  Filled 2024-02-27: qty 1

## 2024-02-27 MED ORDER — ALBUTEROL SULFATE (2.5 MG/3ML) 0.083% IN NEBU: 2.5000 mg | INHALATION_SOLUTION | RESPIRATORY_TRACT | Status: DC | PRN

## 2024-02-27 MED ORDER — HYDROCODONE-ACETAMINOPHEN 5-325 MG PO TABS: 1.0000 | ORAL_TABLET | Freq: Four times a day (QID) | ORAL | 0 refills | Status: DC | PRN

## 2024-02-27 MED ORDER — SODIUM CHLORIDE 0.9 % IV SOLN
2.0000 g | INTRAVENOUS | Status: DC
  Administered 2024-02-27 – 2024-03-01 (×3): 2 g via INTRAVENOUS
  Filled 2024-02-27 (×3): qty 20

## 2024-02-27 MED ORDER — HYDROMORPHONE HCL 1 MG/ML IJ SOLN
1.0000 mg | Freq: Once | INTRAMUSCULAR | Status: AC
  Administered 2024-02-27: 1 mg via INTRAVENOUS
  Filled 2024-02-27: qty 1

## 2024-02-27 MED ORDER — SODIUM CHLORIDE 0.9 % IV SOLN
500.0000 mg | INTRAVENOUS | Status: DC
  Filled 2024-02-27: qty 5

## 2024-02-27 MED ORDER — ENOXAPARIN SODIUM 40 MG/0.4ML IJ SOSY
40.0000 mg | PREFILLED_SYRINGE | INTRAMUSCULAR | Status: DC
  Administered 2024-02-27 – 2024-03-01 (×3): 40 mg via SUBCUTANEOUS
  Filled 2024-02-27 (×3): qty 0.4

## 2024-02-27 MED ORDER — ALBUTEROL SULFATE (2.5 MG/3ML) 0.083% IN NEBU
2.5000 mg | INHALATION_SOLUTION | RESPIRATORY_TRACT | Status: DC
  Administered 2024-02-27 – 2024-02-28 (×4): 2.5 mg via RESPIRATORY_TRACT
  Filled 2024-02-27 (×5): qty 3

## 2024-02-27 MED ORDER — ONDANSETRON 4 MG PO TBDP: 4.0000 mg | ORAL_TABLET | Freq: Three times a day (TID) | ORAL | 0 refills | Status: DC | PRN

## 2024-02-27 MED ORDER — ONDANSETRON HCL 4 MG/2ML IJ SOLN
4.0000 mg | Freq: Three times a day (TID) | INTRAMUSCULAR | Status: DC | PRN
  Administered 2024-02-28: 4 mg via INTRAVENOUS
  Filled 2024-02-27: qty 2

## 2024-02-27 MED ORDER — SODIUM CHLORIDE 0.9 % IV SOLN
500.0000 mg | INTRAVENOUS | Status: DC
  Administered 2024-02-27: 500 mg via INTRAVENOUS

## 2024-02-27 MED ORDER — MORPHINE SULFATE (PF) 2 MG/ML IV SOLN
2.0000 mg | INTRAVENOUS | Status: DC | PRN
  Administered 2024-02-27 – 2024-02-28 (×4): 2 mg via INTRAVENOUS
  Filled 2024-02-27 (×4): qty 1

## 2024-02-27 MED ORDER — MORPHINE SULFATE (PF) 4 MG/ML IV SOLN
4.0000 mg | Freq: Once | INTRAVENOUS | Status: AC
  Administered 2024-02-27: 4 mg via INTRAVENOUS
  Filled 2024-02-27: qty 1

## 2024-02-27 NOTE — Plan of Care (Signed)

## 2024-02-27 NOTE — Progress Notes (Signed)
 Ok to limit azithromycin  to 3d due to its long half life per Dr. Vernon R.  Sergio Batch, PharmD, BCIDP, AAHIVP, CPP Infectious Disease Pharmacist 02/27/2024 1:39 PM

## 2024-02-27 NOTE — Plan of Care (Signed)
 63yo F w/ RUQ pain. Found to have RLL pneumonia - recurrent. No Sepsis. CT ABD unremarkable but showing pneumonia. O2 sats drop to 84% on RA w/ ambulation. Rocephin  and Azithro given in the ED. Hemodynamically stable. Transfer to [facility] for admission to [facility].   Respiratory viral panel ordered.   Alm JINNY Heads, MD [facility]

## 2024-02-27 NOTE — ED Provider Notes (Signed)
 Care assumed pending call back from Hospitalist. Spoke with Dr. Remonia who will accept for admission.    Roselyn Carlin NOVAK, MD 02/27/24 [phone]

## 2024-02-27 NOTE — H&P (Signed)
 History and Physical    Jamie Patterson FMW:[id number] DOB: 1961/01/29 DOA: 02/26/2024  PCP: Jamie Comer POUR, NP  Patient coming from: Home  I have personally briefly reviewed patient's old medical records in [facility]  Chief Complaint: Abdominal pain  HPI: Jamie Patterson is a 63 y.o. female with medical history significant of hypothyroidism, chronic cough, hypertension, hyperlipidemia, GERD, depression, benign essential tremor presented with right upper quadrant pain that started yesterday reports associated with nausea and poor p.o. intake.  She has a history of chronic cough however reports worsening of cough with sputum production which is dark green/brown in color associated with some shortness of breath and wheezing.  No fever, chills, recent sick contact, generalized weakness or lethargy.  She is followed by pulmonology Dr. Meade outpatient, workup for asthma/COPD negative.  Underwent bronchoscopy in 2023 due to hemoptysis as well in the past that also resulted negative.  Former smoker, quit long time ago, no alcohol, illicit drug use.  Reports history of recurrent falls.  Not on oxygen at home.  Does not use walker or cane at home.  Lives with her husband.  ED Course: Upon arrival to ED: Patient afebrile, heart rate 91, RR: 18, BP 160/114 improved to 127/83, requiring 2 L of oxygen via nasal cannula.  CBC shows leukocytosis of 13.1, H&H 12.8/38.0, PLT 227, NA: 133, BUN 9, creatinine 0.74, ALP 136.  Respiratory panel negative.  Initial concern was cholecystitis.  CT abdomen/pelvis shows no acute findings.  CT chest shows new focal patchy opacity in the posterior right middle lobe favoring pneumonia.  Patient was given Rocephin  and azithromycin  at [facility] and transferred to [facility] for the admission for acute hypoxemic respiratory failure in the setting of right middle lobe pneumonia.  Review of Systems: As per HPI otherwise negative.    Past Medical History:  Diagnosis Date    Amblyopia    ARTHRITIS, TRAUMATIC, ANKLE 08/07/2009   Trimalleolar fracture on right s/p fusion     Benign essential tremor    worsened by anxiety   Chronic pain due to injury    at R ankle due to ankle fracture   Depression    followed by psych   GERD (gastroesophageal reflux disease)    Hemoptysis 03/03/2022   History of hypertension    HLD (hyperlipidemia)    Imbalance    likely multifactorial (s/p balance training at [facility] 08/2010) ?CMT   Influenza A 09/15/2018   Iron deficiency anemia    LLL pneumonia 10/05/2018   Right ankle pain 2003   trimalleolar fx s/p fusion   Seizure (HCC)    focal, ex vacuo ventriculomegaly?, pending neuro w/u with sleep deprived EEG   Syncope and collapse 08/19/2011    Past Surgical History:  Procedure Laterality Date   ANKLE FUSION  02/2004   Right-50% disability    BRONCHIAL WASHINGS  03/04/2022   Procedure: BRONCHIAL WASHINGS;  Surgeon: Geronimo Amel, MD;  Location: [facility];  Service: Endoscopy;;   CARPAL TUNNEL RELEASE  12/2017   L CTR repair, R thumb A1 pulley release (Gramig)   CESAREAN SECTION     HAMMER TOE SURGERY  09/12/2019   hospitalization  08/2011   syncope thought 2/2 seizure, MRI - Hyperintensity in the cerebral white matter bilaterally is nonspecific, started on Keppra    ORIF ANKLE FRACTURE  06/2003   Right   VIDEO BRONCHOSCOPY Bilateral 03/04/2022   Procedure: VIDEO BRONCHOSCOPY WITHOUT FLUORO;  Surgeon: Geronimo Amel, MD;  Location: [facility];  Service: Endoscopy;  Laterality: Bilateral;     reports that she quit smoking about 28 years ago. Her smoking use included cigarettes. She started smoking about 48 years ago. She has a 20 pack-year smoking history. She has never used smokeless tobacco. She reports that she does not drink alcohol and does not use drugs.  Allergies  Allergen Reactions   Amlodipine  Swelling    Ankle edema    Family History  Problem Relation Age of Onset   Depression Mother     Arrhythmia Mother 62       Dx with VTach after having sudden collapse, did NOT have h/o AMI's    Atrial fibrillation Father    Hypertension Father    Osteoarthritis Father    Aortic aneurysm Father    Hepatitis Brother        Hep. C   Anemia Sister     Prior to Admission medications   Medication Sig Start Date End Date Taking? Authorizing Provider  amoxicillin -clavulanate (AUGMENTIN ) 875-125 MG tablet Take 1 tablet by mouth every 12 (twelve) hours. 02/26/24  Yes Barrett, Warren SAILOR, PA-C  azithromycin  (ZITHROMAX ) 250 MG tablet Take 1 tablet (250 mg total) by mouth daily. Take first 2 tablets together, then 1 every day until finished. 02/26/24  Yes Barrett, Jamie N, PA-C  HYDROcodone -acetaminophen  (NORCO/VICODIN) 5-325 MG tablet Take 1 tablet by mouth every 6 (six) hours as needed for severe pain (pain score 7-10). 02/27/24  Yes Barrett, Jamie N, PA-C  ondansetron  (ZOFRAN -ODT) 4 MG disintegrating tablet Take 1 tablet (4 mg total) by mouth every 8 (eight) hours as needed for nausea or vomiting. 02/27/24  Yes Barrett, Jamie N, PA-C  acetaminophen  (TYLENOL ) 325 MG tablet Take 2 tablets (650 mg total) by mouth every 6 (six) hours as needed for mild pain (or Fever >/= 101). 03/05/22   Sheikh, Omair Latif, DO  albuterol  (VENTOLIN  HFA) 108 (90 Base) MCG/ACT inhaler INHALE TWO PUFFS INTO THE LUNGS EVERY SIX HOURS AS NEEDED FOR WHEEZING OR SHORTNESS OF BREATH. 11/09/23   Clark, Katherine K, NP  Ascorbic Acid (VITAMIN C) 1000 MG tablet Take 1,000 mg by mouth daily.    [provider]  carvedilol  (COREG ) 25 MG tablet Take 1 tablet (25 mg total) by mouth 2 (two) times daily with a meal. 10/07/23   Delford Maude BROCKS, MD  cholecalciferol  (VITAMIN D3) 25 MCG (1000 UNIT) tablet Take 1,000 Units by mouth daily.    [provider]  cycloSPORINE  (RESTASIS ) 0.05 % ophthalmic emulsion Place 1 drop into both eyes 2 (two) times daily.    [provider]  DULoxetine  (CYMBALTA ) 60 MG capsule Take 60 mg  by mouth 2 (two) times daily.     [provider]  escitalopram  (LEXAPRO ) 10 MG tablet Take 10 mg by mouth daily.  10/01/15   [provider]  ferrous sulfate  325 (65 FE) MG tablet Take 325 mg by mouth daily with breakfast.    [provider]  gabapentin  (NEURONTIN ) 300 MG capsule Take 300-1,200 mg by mouth See admin instructions. Take 1 capsule four times a day, then take 4 capsules at bedtime    [provider]  lamoTRIgine  (LAMICTAL ) 150 MG tablet TAKE 1 TABLET BY MOUTH TWICE A DAY 04/03/17   Tat, Rebecca S, DO  levothyroxine  (SYNTHROID ) 50 MCG tablet TAKE ONE TABLET BY MOUTH ONCE A DAY. TAKE ON AN EMPTY STOMACH WITH A GLASS OF WATER ATLEAST 30-60 MIN BEFORE BREAKFAST 05/22/23   Clark, Katherine K, NP  MAGNESIUM PO Take 400 mg by mouth 3 (three) times daily.     [provider]  Melatonin 10 MG TABS Take 2 tablets by mouth at bedtime.     [provider]  Multiple Vitamin (MULTIVITAMIN) tablet Take 1 tablet by mouth daily.    [provider]  naproxen  (NAPROSYN ) 500 MG tablet Take 1 tablet (500 mg total) by mouth 2 (two) times daily. 02/06/23   Theadore Ozell HERO, MD  olmesartan  (BENICAR ) 40 MG tablet Take 1 tablet (40 mg total) by mouth daily. for blood pressure. 07/16/23   Clark, Katherine K, NP  primidone  (MYSOLINE ) 50 MG tablet Take 1 tablet (50 mg total) by mouth 2 (two) times daily. 04/14/23   Tat, Asberry RAMAN, DO  rosuvastatin  (CRESTOR ) 40 MG tablet Take 1 tablet (40 mg total) by mouth daily. 11/12/23   Delford Maude BROCKS, MD  tiZANidine  (ZANAFLEX ) 4 MG tablet Take 8 mg by mouth at bedtime. 09/24/21   [provider]  vitamin B-12 (CYANOCOBALAMIN ) 1000 MCG tablet Take 1,000 mcg by mouth daily.    [provider]    Physical Exam: Vitals:   02/27/24 1015 02/27/24 1035 02/27/24 1107 02/27/24 1158  BP:  (!) 150/86 106/61 137/73  Pulse: 97  (!) 104 (!) 110  Resp:      Temp:      TempSrc:      SpO2: 97%  98% 95%  Weight:       Height:        Constitutional: Mild respiratory distress, on nasal cannula, husband at the bedside Eyes: PERRL, lids and conjunctivae normal ENMT: Mucous membranes are moist. Posterior pharynx clear of any exudate or lesions.Normal dentition.  Neck: normal, supple, no masses, no thyromegaly Respiratory: Bilateral diffuse rhonchi and expiratory wheezing noted Cardiovascular: Regular rate and rhythm, no murmurs / rubs / gallops. No extremity edema. 2+ pedal pulses. No carotid bruits.  Abdomen: no tenderness, no masses palpated. No hepatosplenomegaly. Bowel sounds positive.  Musculoskeletal: no clubbing / cyanosis. No joint deformity upper and lower extremities. Good ROM, no contractures. Normal muscle tone.  Skin: no rashes, lesions, ulcers. No induration Neurologic: CN 2-12 grossly intact. Sensation intact, DTR normal. Strength 5/5 in all 4.  Psychiatric: Normal judgment and insight. Alert and oriented x 3. Normal mood.    Labs on Admission: I have personally reviewed following labs and imaging studies  CBC: Recent Labs  Lab 02/26/24 2203  WBC 13.1*  NEUTROABS 9.8*  HGB 12.8  HCT 38.0  MCV 99.0  PLT 227   Basic Metabolic Panel: Recent Labs  Lab 02/26/24 2203  NA 133*  K 4.9  CL 95*  CO2 24  GLUCOSE 136*  BUN 9  CREATININE 0.74  CALCIUM  9.9   GFR: Estimated Creatinine Clearance: 83.2 mL/min (by C-G formula based on SCr of 0.74 mg/dL). Liver Function Tests: Recent Labs  Lab 02/26/24 2203  AST 23  ALT 20  ALKPHOS 136*  BILITOT 0.6  PROT 7.0  ALBUMIN  4.5   Recent Labs  Lab 02/26/24 2203  LIPASE <10*   No results for input(s): AMMONIA in the last 168 hours. Coagulation Profile: No results for input(s): INR, PROTIME in the last 168 hours. Cardiac Enzymes: No results for input(s): CKTOTAL, CKMB, CKMBINDEX, TROPONINI in the last 168 hours. BNP (last 3 results) No results for input(s): PROBNP in the last 8760 hours. HbA1C: No results for  input(s): HGBA1C in the last 72 hours. CBG: No results for input(s): GLUCAP in the last  168 hours. Lipid Profile: No results for input(s): CHOL, HDL, LDLCALC, TRIG, CHOLHDL, LDLDIRECT in the last 72 hours. Thyroid  Function Tests: No results for input(s): TSH, T4TOTAL, FREET4, T3FREE, THYROIDAB in the last 72 hours. Anemia Panel: No results for input(s): VITAMINB12, FOLATE, FERRITIN, TIBC, IRON, RETICCTPCT in the last 72 hours. Urine analysis:    Component Value Date/Time   COLORURINE YELLOW 08/19/2011 1500   APPEARANCEUR CLEAR 08/19/2011 1500   LABSPEC 1.008 08/19/2011 1500   PHURINE 8.0 08/19/2011 1500   GLUCOSEU NEGATIVE 08/19/2011 1500   HGBUR NEGATIVE 08/19/2011 1500   HGBUR large 07/23/2010 1505   BILIRUBINUR Unable to determine 08/19/2012 1039   KETONESUR NEGATIVE 08/19/2011 1500   PROTEINUR Unable to determine 08/19/2012 1039   PROTEINUR NEGATIVE 08/19/2011 1500   UROBILINOGEN  08/19/2012 1039     Comment:     Unable to determine due to Azo   UROBILINOGEN 0.2 08/19/2011 1500   NITRITE Unable to determine 08/19/2012 1039   NITRITE NEGATIVE 08/19/2011 1500   LEUKOCYTESUR moderate (2+) 08/19/2012 1039    Radiological Exams on Admission: CT Chest Wo Contrast Result Date: 02/26/2024 EXAM: CT CHEST WITHOUT CONTRAST 02/26/2024 11:18:18 PM TECHNIQUE: CT of the chest was performed without the administration of intravenous contrast. Multiplanar reformatted images are provided for review. Automated exposure control, iterative reconstruction, and/or weight based adjustment of the mA/kV was utilized to reduce the radiation dose to as low as reasonably achievable. COMPARISON: Cardiac CT chest dated 10/16/2023. CLINICAL HISTORY: ILD. FINDINGS: MEDIASTINUM: Heart and pericardium are unremarkable. The central airways are clear. LYMPH NODES: No mediastinal, hilar or axillary lymphadenopathy. LUNGS AND PLEURA: Trace right pleural effusion. Linear/patchy  opacities in the posterior right upper lobe and bilateral lower lobes, favoring scarring. Additional focal/patchy opacity in the posterior right middle lobe favoring pneumonia with areas of central low density/necrosis (image 77), new from prior. Mild centrilobular and paracentral emphysematous changes, upper lung predominant. Biapical pleural parenchymal scarring. SOFT TISSUES/BONES: No acute abnormality of the bones or soft tissues. UPPER ABDOMEN: Evaluated on dedicated CT abdomen/pelvis. IMPRESSION: 1. New focal/patchy opacity in the posterior right middle lobe, favoring pneumonia. Follow-up chest radiographs are suggested in 4-6 weeks to document resolution. 2. Trace right pleural effusion. Electronically signed by: Pinkie Pebbles MD 02/26/2024 11:33 PM EDT RP Workstation: [id number]   CT ABDOMEN PELVIS W CONTRAST Result Date: 02/26/2024 EXAM: CT ABDOMEN AND PELVIS WITH CONTRAST 02/26/2024 11:17:41 PM TECHNIQUE: CT of the abdomen and pelvis was performed with the administration of intravenous contrast. Multiplanar reformatted images are provided for review. Automated exposure control, iterative reconstruction, and/or weight based adjustment of the mA/kV was utilized to reduce the radiation dose to as low as reasonably achievable. COMPARISON: 07/08/2023 CLINICAL HISTORY: Abdominal pain, acute, nonlocalized. FINDINGS: LOWER CHEST: Trace right pleural effusion. Dedicated CT chest reported separately. LIVER: Subcentimeter probable central right hepatic cyst (image 29). GALLBLADDER AND BILE DUCTS: Layering small gallstones (image 30), without associated inflammatory changes. SPLEEN: No acute abnormality. PANCREAS: No acute abnormality. ADRENAL GLANDS: No acute abnormality. 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 No follow up is recommended. No stones in the kidneys or ureters. No hydronephrosis. No  perinephric or periureteral stranding. Urinary bladder is unremarkable. GI AND BOWEL: Normal appendix (image 57). Stomach demonstrates no acute abnormality. There is no bowel obstruction. No bowel wall thickening. PERITONEUM AND RETROPERITONEUM: No ascites. No free air. VASCULATURE: Atherosclerotic calcifications of the abdominal aorta and branch vessels, although patent. LYMPH  NODES: No lymphadenopathy. REPRODUCTIVE ORGANS: No acute abnormality. BONES AND SOFT TISSUES: Mild degenerative changes of the visualized thoracolumbar spine. No focal soft tissue abnormality. IMPRESSION: 1. No acute findings in the abdomen or pelvis. 2. Normal appendix. 3. Trace right pleural effusion. Dedicated CT chest reported separately. Electronically signed by: Pinkie Pebbles MD 02/26/2024 11:24 PM EDT RP Workstation: [id number]    Assessment/Plan  Acute hypoxemic respiratory failure in the setting of right middle lobe pneumonia: -Reviewed CT chest.  Requiring 2 L of oxygen via nasal cannula.  Respiratory panel negative.  Check COVID flu RSV. -She is afebrile with leukocytosis.  Will continue azithromycin  and Rocephin .  Check urine Legionella and strep pneumoniae antigen.  Check procalcitonin, sputum and blood culture -Will try to wean off of oxygen.   Right upper quadrant pain: - CT abdomen showed small gallstones without associated inflammatory changes.  Chronic cough Prior history of hemoptysis: -Followed by [facility] outpatient. - Will continue scheduled and as needed albuterol   Hypertension: -On Coreg  and olmesartan  at home.  Will resume once med review done by pharmacy  Hyperlipidemia: On statin  Depression/anxiety: Continue Lexapro  and duloxetine   Hypothyroidism: Check TSH continue levothyroxine   Obesity with BMI of 34: -Recommend diet modification exercise and weight loss  Chronic pain due to ankle fusion: History of recurrent falls - Recommend follow-up with PCP.   Elevated coronary calcium   score: - Followed by cardiology.  On Crestor  40 mg.  DVT prophylaxis: Lovenox  Code Status: Full code Family Communication: Patient's husband  present at bedside.  Plan of care discussed with patient in length and she verbalized understanding and agreed with it. Disposition Plan: Home Consults called: None  Admission status: inPatient   Velna JONELLE Skeeter MD [facility]  If 7PM-7AM, please contact night-coverage [email or web address]  02/27/2024, 1:17 PM

## 2024-02-27 NOTE — ED Notes (Signed)
 Attempted to give nursing report 1x.

## 2024-02-27 NOTE — ED Notes (Signed)
 Walked on room air for about 40 feet. SpO2 dropped to 84% with tachycardic rate 131 and apparent shortness of breath. Returned to room and rested. Recovered quickly with SpO2 92-96 HR 82.

## 2024-02-28 ENCOUNTER — Inpatient Hospital Stay (HOSPITAL_COMMUNITY)

## 2024-02-28 DIAGNOSIS — J189 Pneumonia, unspecified organism: Secondary | ICD-10-CM

## 2024-02-28 DIAGNOSIS — K8020 Calculus of gallbladder without cholecystitis without obstruction: Secondary | ICD-10-CM | POA: Diagnosis not present

## 2024-02-28 DIAGNOSIS — R1011 Right upper quadrant pain: Secondary | ICD-10-CM

## 2024-02-28 DIAGNOSIS — J9601 Acute respiratory failure with hypoxia: Secondary | ICD-10-CM | POA: Diagnosis not present

## 2024-02-28 LAB — BASIC METABOLIC PANEL WITH GFR
Anion gap: 12 (ref 5–15)
BUN: 7 mg/dL — ABNORMAL LOW (ref 8–23)
CO2: 21 mmol/L — ABNORMAL LOW (ref 22–32)
Calcium: 9.4 mg/dL (ref 8.9–10.3)
Chloride: 99 mmol/L (ref 98–111)
Creatinine, Ser: 0.56 mg/dL (ref 0.44–1.00)
GFR, Estimated: >60 mL/min (ref >60)
Glucose, Bld: 156 mg/dL — ABNORMAL HIGH (ref 70–99)
Potassium: 4.3 mmol/L (ref 3.5–5.1)
Sodium: 132 mmol/L — ABNORMAL LOW (ref 135–145)

## 2024-02-28 LAB — PROTIME-INR
INR: 1.2 (ref 0.8–1.2)
Prothrombin Time: 15.4 s — ABNORMAL HIGH (ref 11.4–15.2)

## 2024-02-28 LAB — RESP PANEL BY RT-PCR (RSV, FLU A&B, COVID)  RVPGX2
Influenza A by PCR: NEGATIVE
Influenza B by PCR: NEGATIVE
Resp Syncytial Virus by PCR: NEGATIVE
SARS Coronavirus 2 by RT PCR: NEGATIVE

## 2024-02-28 LAB — HEPATIC FUNCTION PANEL
ALT: 17 U/L (ref 0–44)
AST: 24 U/L (ref 15–41)
Albumin: 3.1 g/dL — ABNORMAL LOW (ref 3.5–5.0)
Alkaline Phosphatase: 112 U/L (ref 38–126)
Bilirubin, Direct: 0.2 mg/dL (ref 0.0–0.2)
Indirect Bilirubin: 0.7 mg/dL (ref 0.3–0.9)
Total Bilirubin: 0.9 mg/dL (ref 0.0–1.2)
Total Protein: 7 g/dL (ref 6.5–8.1)

## 2024-02-28 LAB — CBC
HCT: 37.4 % (ref 36.0–46.0)
Hemoglobin: 12.4 g/dL (ref 12.0–15.0)
MCH: 33 pg (ref 26.0–34.0)
MCHC: 33.2 g/dL (ref 30.0–36.0)
MCV: 99.5 fL (ref 80.0–100.0)
Platelets: 290 K/uL (ref 150–400)
RBC: 3.76 MIL/uL — ABNORMAL LOW (ref 3.87–5.11)
RDW: 12.4 % (ref 11.5–15.5)
WBC: 12.1 K/uL — ABNORMAL HIGH (ref 4.0–10.5)
nRBC: 0 % (ref 0.0–0.2)

## 2024-02-28 LAB — STREP PNEUMONIAE URINARY ANTIGEN: Strep Pneumo Urinary Antigen: NEGATIVE

## 2024-02-28 LAB — LIPASE, BLOOD: Lipase: 22 U/L (ref 11–51)

## 2024-02-28 MED ORDER — LEVOTHYROXINE SODIUM 50 MCG PO TABS
50.0000 ug | ORAL_TABLET | Freq: Every day | ORAL | Status: DC
  Administered 2024-02-28 – 2024-03-02 (×4): 50 ug via ORAL
  Filled 2024-02-28 (×4): qty 1

## 2024-02-28 MED ORDER — DIPHENHYDRAMINE HCL 50 MG/ML IJ SOLN: 25.0000 mg | Freq: Four times a day (QID) | INTRAMUSCULAR | Status: DC | PRN

## 2024-02-28 MED ORDER — ACETAMINOPHEN 325 MG PO TABS
650.0000 mg | ORAL_TABLET | Freq: Four times a day (QID) | ORAL | Status: DC | PRN
  Administered 2024-02-28: 650 mg via ORAL
  Filled 2024-02-28: qty 2

## 2024-02-28 MED ORDER — ESCITALOPRAM OXALATE 10 MG PO TABS
10.0000 mg | ORAL_TABLET | Freq: Every day | ORAL | Status: DC
  Administered 2024-02-28 – 2024-03-02 (×3): 10 mg via ORAL
  Filled 2024-02-28 (×3): qty 1

## 2024-02-28 MED ORDER — ONDANSETRON HCL 4 MG/2ML IJ SOLN: 4.0000 mg | Freq: Four times a day (QID) | INTRAMUSCULAR | Status: DC | PRN

## 2024-02-28 MED ORDER — BISACODYL 10 MG RE SUPP: 10.0000 mg | Freq: Every day | RECTAL | Status: DC | PRN

## 2024-02-28 MED ORDER — SODIUM CHLORIDE 0.9 % IV SOLN
12.5000 mg | Freq: Four times a day (QID) | INTRAVENOUS | Status: DC | PRN
  Administered 2024-02-28: 12.5 mg via INTRAVENOUS
  Filled 2024-02-28: qty 12.5
  Filled 2024-02-28: qty 0.5

## 2024-02-28 MED ORDER — HYDROMORPHONE HCL 1 MG/ML IJ SOLN
1.0000 mg | INTRAMUSCULAR | Status: DC | PRN
  Administered 2024-02-28: 1 mg via INTRAVENOUS
  Filled 2024-02-28: qty 1

## 2024-02-28 MED ORDER — GABAPENTIN 300 MG PO CAPS
300.0000 mg | ORAL_CAPSULE | ORAL | Status: DC
  Administered 2024-02-28 – 2024-03-02 (×8): 300 mg via ORAL
  Filled 2024-02-28 (×9): qty 1

## 2024-02-28 MED ORDER — DULOXETINE HCL 60 MG PO CPEP
60.0000 mg | ORAL_CAPSULE | Freq: Two times a day (BID) | ORAL | Status: DC
  Administered 2024-02-28 – 2024-03-02 (×6): 60 mg via ORAL
  Filled 2024-02-28 (×6): qty 1

## 2024-02-28 MED ORDER — VITAMIN B-12 1000 MCG PO TABS
1000.0000 ug | ORAL_TABLET | Freq: Every day | ORAL | Status: DC
  Administered 2024-02-28 – 2024-03-02 (×3): 1000 ug via ORAL
  Filled 2024-02-28 (×3): qty 1

## 2024-02-28 MED ORDER — GABAPENTIN 300 MG PO CAPS: 300.0000 mg | ORAL_CAPSULE | ORAL | Status: DC

## 2024-02-28 MED ORDER — OXYCODONE HCL 5 MG PO TABS
5.0000 mg | ORAL_TABLET | ORAL | Status: DC | PRN
  Administered 2024-02-28 – 2024-03-01 (×6): 5 mg via ORAL
  Filled 2024-02-28 (×6): qty 1

## 2024-02-28 MED ORDER — POLYETHYLENE GLYCOL 3350 17 G PO PACK
17.0000 g | PACK | Freq: Every day | ORAL | Status: DC
  Administered 2024-03-01 – 2024-03-02 (×2): 17 g via ORAL
  Filled 2024-02-28 (×3): qty 1

## 2024-02-28 MED ORDER — PRIMIDONE 50 MG PO TABS
50.0000 mg | ORAL_TABLET | Freq: Two times a day (BID) | ORAL | Status: DC
  Administered 2024-02-28 – 2024-03-02 (×6): 50 mg via ORAL
  Filled 2024-02-28 (×8): qty 1

## 2024-02-28 MED ORDER — LAMOTRIGINE 100 MG PO TABS
150.0000 mg | ORAL_TABLET | Freq: Two times a day (BID) | ORAL | Status: DC
  Administered 2024-02-28 – 2024-03-02 (×6): 150 mg via ORAL
  Filled 2024-02-28 (×6): qty 2

## 2024-02-28 MED ORDER — GABAPENTIN 400 MG PO CAPS: 1200.0000 mg | ORAL_CAPSULE | Freq: Every day | ORAL | Status: DC

## 2024-02-28 MED ORDER — TIZANIDINE HCL 4 MG PO TABS
8.0000 mg | ORAL_TABLET | Freq: Every day | ORAL | Status: DC
  Administered 2024-02-28 – 2024-03-01 (×3): 8 mg via ORAL
  Filled 2024-02-28 (×4): qty 2

## 2024-02-28 MED ORDER — OXYCODONE HCL 5 MG PO TABS
5.0000 mg | ORAL_TABLET | Freq: Four times a day (QID) | ORAL | Status: DC | PRN
  Administered 2024-02-28: 5 mg via ORAL
  Filled 2024-02-28: qty 1

## 2024-02-28 MED ORDER — ALBUTEROL SULFATE (2.5 MG/3ML) 0.083% IN NEBU
2.5000 mg | INHALATION_SOLUTION | Freq: Four times a day (QID) | RESPIRATORY_TRACT | Status: DC
  Administered 2024-02-28 – 2024-02-29 (×3): 2.5 mg via RESPIRATORY_TRACT
  Filled 2024-02-28 (×4): qty 3

## 2024-02-28 MED ORDER — KETOROLAC TROMETHAMINE 15 MG/ML IJ SOLN
15.0000 mg | Freq: Four times a day (QID) | INTRAMUSCULAR | Status: AC
  Administered 2024-02-28 – 2024-03-02 (×10): 15 mg via INTRAVENOUS
  Filled 2024-02-28 (×10): qty 1

## 2024-02-28 MED ORDER — SODIUM CHLORIDE 0.9 % IV SOLN: INTRAVENOUS | Status: DC

## 2024-02-28 MED ORDER — HYDROMORPHONE HCL 1 MG/ML IJ SOLN
1.0000 mg | INTRAMUSCULAR | Status: DC | PRN
  Administered 2024-02-28 (×2): 2 mg via INTRAVENOUS
  Filled 2024-02-28 (×2): qty 2

## 2024-02-28 MED ORDER — CARVEDILOL 12.5 MG PO TABS
12.5000 mg | ORAL_TABLET | Freq: Two times a day (BID) | ORAL | Status: DC
  Administered 2024-02-28: 12.5 mg via ORAL
  Filled 2024-02-28: qty 1

## 2024-02-28 MED ORDER — LIDOCAINE 5 % EX PTCH
1.0000 | MEDICATED_PATCH | CUTANEOUS | Status: DC
  Administered 2024-02-28 – 2024-03-01 (×2): 1 via TRANSDERMAL
  Filled 2024-02-28 (×2): qty 1

## 2024-02-28 MED ORDER — AZITHROMYCIN 500 MG PO TABS
500.0000 mg | ORAL_TABLET | Freq: Once | ORAL | Status: AC
  Administered 2024-02-28: 500 mg via ORAL
  Filled 2024-02-28: qty 1

## 2024-02-28 MED ORDER — NALOXONE HCL 0.4 MG/ML IJ SOLN: 0.4000 mg | INTRAMUSCULAR | Status: DC | PRN

## 2024-02-28 MED ORDER — ROSUVASTATIN CALCIUM 20 MG PO TABS
40.0000 mg | ORAL_TABLET | Freq: Every day | ORAL | Status: DC
  Administered 2024-02-28 – 2024-03-02 (×3): 40 mg via ORAL
  Filled 2024-02-28 (×3): qty 2

## 2024-02-28 MED ORDER — ACETAMINOPHEN 500 MG PO TABS
1000.0000 mg | ORAL_TABLET | Freq: Four times a day (QID) | ORAL | Status: DC
  Administered 2024-02-28 – 2024-03-01 (×4): 1000 mg via ORAL
  Filled 2024-02-28 (×4): qty 2

## 2024-02-28 MED ORDER — METRONIDAZOLE 500 MG/100ML IV SOLN
500.0000 mg | Freq: Two times a day (BID) | INTRAVENOUS | Status: DC
  Administered 2024-02-28 – 2024-03-01 (×5): 500 mg via INTRAVENOUS
  Filled 2024-02-28 (×5): qty 100

## 2024-02-28 MED ORDER — ALPRAZOLAM 0.25 MG PO TABS
0.2500 mg | ORAL_TABLET | Freq: Three times a day (TID) | ORAL | Status: DC | PRN
  Administered 2024-02-28 – 2024-02-29 (×2): 0.25 mg via ORAL
  Filled 2024-02-28 (×2): qty 1

## 2024-02-28 NOTE — Consult Note (Signed)
 Reason for Consult: Abdominal pain, cholelithiasis Referring Provider: Raenelle, MD  HPI  Jamie Patterson is an 63 y.o. female with history of hypothyroidism, hypertension, hyperlipidemia, essential tremor, chronic neuropathic pain, depression/anxiety who has presented with a several day history of right sided abdominal and chest pain and a worsening cough.  Workup was notable for a new focal, patchy opacity in the posterior right middle lobe consistent with pneumonia there were also areas of necrosis which are new from prior scans.  She was noted to have cholelithiasis but no evidence of pericholecystic fluid, inflammation, or gallbladder wall thickening.  She was admitted for treatment of her pneumonia due to worsening of her chronic cough, and increased sputum production which was purulent appearing, and worsening shortness of breath and wheezing.  Patient has continued to have right abdominal pain and right upper quadrant ultrasound was obtained earlier today that showed no gallbladder wall thickening, no pericholecystic fluid, no ductal dilatation.  Patient does have what seems to be an element of pleuritic chest pain likely related to her pneumonia, but she does have some right upper quadrant pain.  Her pain is very superior, almost completely beneath her rib cage.  Her LFTs are completely normal. Mild leukocytosis to 12.1.   10 point review of systems is negative except as listed above in HPI.  Objective  Past Medical History: 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   Trimalleolar fracture on right s/p fusion     Benign essential tremor    worsened by anxiety   Chronic pain due to injury    at R ankle due to ankle fracture   Depression    followed by psych   GERD (gastroesophageal reflux disease)    Hemoptysis 03/03/2022   History of hypertension    HLD (hyperlipidemia)    Imbalance    likely multifactorial (s/p balance  training at [facility] 08/2010) ?CMT   Influenza A 09/15/2018   Iron deficiency anemia    LLL pneumonia 10/05/2018   Right ankle pain 2003   trimalleolar fx s/p fusion   Seizure (HCC)    focal, ex vacuo ventriculomegaly?, pending neuro w/u with sleep deprived EEG   Syncope and collapse 08/19/2011    Past Surgical History: Past Surgical History:  Procedure Laterality Date   ANKLE FUSION  02/2004   Right-50% disability    BRONCHIAL WASHINGS  03/04/2022   Procedure: BRONCHIAL WASHINGS;  Surgeon: Geronimo Amel, MD;  Location: [facility];  Service: Endoscopy;;   CARPAL TUNNEL RELEASE  12/2017   L CTR repair, R thumb A1 pulley release (Gramig)   CESAREAN SECTION     HAMMER TOE SURGERY  09/12/2019   hospitalization  08/2011   syncope thought 2/2 seizure, MRI - Hyperintensity in the cerebral white matter bilaterally is nonspecific, started on Keppra    ORIF ANKLE FRACTURE  06/2003   Right   VIDEO BRONCHOSCOPY Bilateral 03/04/2022   Procedure: VIDEO BRONCHOSCOPY WITHOUT FLUORO;  Surgeon: Geronimo Amel, MD;  Location: [facility];  Service: Endoscopy;  Laterality: Bilateral;    Family History:  Family History  Problem Relation Age of Onset   Depression Mother    Arrhythmia Mother 34       Dx with VTach after having sudden collapse, did NOT have h/o AMI's    Atrial fibrillation Father    Hypertension Father    Osteoarthritis Father    Aortic aneurysm Father    Hepatitis Brother  Hep. C   Anemia Sister     Social History:  reports that she quit smoking about 28 years ago. Her smoking use included cigarettes. She started smoking about 48 years ago. She has a 20 pack-year smoking history. She has never used smokeless tobacco. She reports that she does not drink alcohol and does not use drugs.  Allergies:  Allergies  Allergen Reactions   Amlodipine  Swelling    Ankle edema    Medications: I have reviewed the patient's current medications.  Labs: I have personally reviewed  all labs for the past 24h  Imaging: I have personally reviewed and interpreted all imaging for the past 24h and agree with the radiologist's impression.  US  Abdomen Limited RUQ (LIVER/GB) Result Date: 02/28/2024 CLINICAL DATA:  Right upper quadrant pain EXAM: ULTRASOUND ABDOMEN LIMITED RIGHT UPPER QUADRANT COMPARISON:  CT 02/26/2024. FINDINGS: Gallbladder: Distended gallbladder. No wall thickening or adjacent fluid. Dependent stones. The sonographer does report pain when scanning the gallbladder. Common bile duct: Diameter: 3 mm Liver: No focal lesion identified. Within normal limits in parenchymal echogenicity. Portal vein is patent on color Doppler imaging with normal direction of blood flow towards the liver. Other: None. IMPRESSION: Distended gallbladder with stones. The sonographer does report pain when scanning the gallbladder, Murphy's sign. No wall thickening or adjacent fluid or ductal dilatation. If there is further clinical concern of acute cholecystitis confirmatory HIDA scan could be considered. Electronically Signed   By: Ranell Bring M.D.   On: 02/28/2024 10:23     Physical Exam Blood pressure (!) 142/95, pulse 92, temperature 98.5 F (36.9 C), temperature source Oral, resp. rate 18, height 5' 5 (1.651 m), weight 95.3 kg, last menstrual period 06/01/2013, SpO2 95%. General: moderate acute distress due to pain HEENT: normocephalic, atraumatic Oropharynx: mucous membranes moist CV: Regular rate and rhythm, hypertensive Chest: equal chest rise bilaterally normal respiratory effort on room air Abdomen: soft, nondistended, tender to very deep palpation under rib cage  Extremities: moves all extremities Skin: warm, dry, no rashes Psych: normal memory, normal mood/affect  Neuro: No focal neurologic deficits, A&Ox3    Assessment   Jamie Patterson is an 63 y.o. female with pneumonia, cholelithiasis, and concern for cholecystitis  Plan  - HIDA has already been ordered by primary team -  Ok for CLD then NPO at midnight - I discussed with patient and her husband and son at bedside that her imaging is not consistent with an acute infection of her gallbladder.  However the fact that she is having right upper quadrant pain may indicate inflammation of the gallbladder.  However, she does have active pneumonia with new necrosis and unclear if some of her pain is radiating from pleuritic chest pain.  Especially in the circumstance that even with very deep palpation it is hard to elicit focal tenderness on exam, patient more so complains of the pain in this area, but my exam does not seem to make much of a difference in the pain level she is experiencing. - I did discuss the risks of surgery in case the HIDA is positive.  These risks include but are not limited to: Bleeding, infection, injury to surrounding structures, retained stone, bile leak, common bile duct injury, conversion to open cholecystectomy, subtotal cholecystectomy with drain placement. - Agree with IV antibiotics that patient is already on. - If patient were to have abnormalities in her morning LFTs or morning exam more clearly from gallbladder and not related to pleuritic chest pain, then we may  be able to forego the HIDA scan and proceed straight to the OR.  I reviewed hospitalist notes, last 24 h vitals and pain scores, last 48 h intake and output, last 24 h labs and trends, and last 24 h imaging results.  This care required high  level of medical decision making.    Orie Silversmith, MD [facility]

## 2024-02-28 NOTE — Progress Notes (Signed)
PHARMACIST - PHYSICIAN COMMUNICATION DR:   Ghimire CONCERNING: Antibiotic IV to Oral Route Change Policy  RECOMMENDATION: This patient is receiving azithromycin by the intravenous route.  Based on criteria approved by the Pharmacy and Therapeutics Committee, the antibiotic(s) is/are being converted to the equivalent oral dose form(s).   DESCRIPTION: These criteria include: Patient being treated for a respiratory tract infection, urinary tract infection, cellulitis or clostridium difficile associated diarrhea if on metronidazole The patient is not neutropenic and does not exhibit a GI malabsorption state The patient is eating (either orally or via tube) and/or has been taking other orally administered medications for a least 24 hours The patient is improving clinically and has a Tmax < 100.5  If you have questions about this conversion, please contact the Pharmacy Department  []  ( [phone] )  [place] []  ( [phone]  [facility] [x]  ( [phone]  [place] []  ( [phone]  [facility] []  ( [phone] )  [facility]   

## 2024-02-28 NOTE — Plan of Care (Signed)

## 2024-02-28 NOTE — Plan of Care (Signed)

## 2024-02-28 NOTE — Progress Notes (Addendum)
 PROGRESS NOTE        PATIENT DETAILS Name: Jamie Patterson Age: 63 y.o. Sex: female Date of Birth: [birth year] Admit Date: 02/26/2024 Admitting Physician Velna JONELLE Skeeter, MD ERE:Rojmx, Comer POUR, NP  Brief Summary: Patient is a 63 y.o.  female with history of hypothyroidism, HTN, HLD, essential tremor, chronic neuropathic pain, depression/anxiety-who presented with 3-4-day history of RUQ pain/worsening cough.  She was thought to have pneumonia and subsequently admitted to the hospitalist service.  Significant events: 7/18>> admit to [facility]  Significant studies: 7/18>> CT chest: Patchy opacity-right middle lobe. 7/18>> CT abdomen/pelvis: Layering gallstones-without inflammatory changes.  No acute findings.  Significant microbiology data: 7/19>> blood culture: No growth 7/19>> COVID/RSV/respiratory virus panel: Negative 7/19>> respiratory virus panel: Negative 7/19>> sputum culture: Pending  Procedures: None  Consults: None  Subjective: Appears uncomfortable-somewhat nauseous -complains of RLQ pain which appears to be pleuritic as well.  Objective: Vitals: Blood pressure (!) 142/88, pulse 93, temperature 98.4 F (36.9 C), temperature source Oral, resp. rate 18, height 5' 5 (1.651 m), weight 95.3 kg, last menstrual period 06/01/2013, SpO2 98%.   Exam: Gen Exam:Alert awake-not in any distress HEENT:atraumatic, normocephalic Chest: B/L clear to auscultation anteriorly CVS:S1S2 regular Abdomen: Soft-some mild RUQ tenderness but no rebound Extremities:no edema Neurology: Non focal Skin: no rash  Pertinent Labs/Radiology:    Latest Ref Rng & Units 02/28/2024    6:57 AM 02/27/2024    1:44 PM 02/26/2024   10:03 PM  CBC  WBC 4.0 - 10.5 K/uL 12.1  12.1  13.1   Hemoglobin 12.0 - 15.0 g/dL 87.5  87.7  87.1   Hematocrit 36.0 - 46.0 % 37.4  36.7  38.0   Platelets 150 - 400 K/uL 290  229  227     Lab Results  Component Value Date   NA 132 (L) 02/28/2024    K 4.3 02/28/2024   CL 99 02/28/2024   CO2 21 (L) 02/28/2024     Assessment/Plan: Acute hypoxemic respiratory failure secondary to right lobar pneumonia Continues to have productive cough-nausea-RUQ pain appears unchanged. However no fever-mild leukocytosis persists Continue Rocephin /Zithromax .  RUQ pain Appears pleuritic On exam-some tenderness but no rebound CT abdomen-no inflammatory signs around the gallbladder. Given persistent pain-will check RUQ ultrasound. Suspicion that RUQ pain is likely from PNA at this point-unless RUQ ultrasound suggests otherwise. Appears uncomfortable-will switch from morphine  to Dilaudid   Addendum Ultrasound of the gallbladder shows multiple gallstones-with distended gallbladder but no gallbladder wall thickening.  HIDA scan ordered.   However informed by nurse that patient continues to have severe RUQ pain-with hardly any relief from given IV Dilaudid .  She continues to have nausea too.  This amount of pain seems disproportionate to PNA-with hardly any relief from IV narcotics.  Have discussed with general surgery-Dr. Gwenith will evaluate shortly-request that we get LFTs which has been ordered along with INR and lipase.  Have increased IV Dilaudid  dosage-if continues to have severe pain-may need to be placed on a Dilaudid  PCA pump.  Known history of cholelithiasis Supportive care See above  HLD Statin  HTN BP creeping up Resume Coreg  at half of home dose Continue to hold Benicar .  Hypothyroidism Continue Synthroid   Neuropathic iron/chronic diarrhea Resume Neurontin -at half of home dose-per patient-does not take Neurontin  as prescribed by her psychiatrist  Anxiety/depression Somewhat worse due to pain/hospitalization Resume Lexapro /Cymbalta .  History of  intention tremor Primidone   Class 1 Obesity: Estimated body mass index is 34.95 kg/m as calculated from the following:   Height as of this encounter: 5' 5 (1.651 m).   Weight  as of this encounter: 95.3 kg.   Code status:   Code Status: Full Code   DVT Prophylaxis: enoxaparin  (LOVENOX ) injection 40 mg Start: 02/27/24 1400 SCDs Start: 02/27/24 1231   Family Communication: Spouse at bedside   Disposition Plan: Status is: Inpatient Remains inpatient appropriate because: Severity of illness   Planned Discharge Destination:Home   Diet: Diet Order             Diet regular Room service appropriate? Yes; Fluid consistency: Thin  Diet effective now                     Antimicrobial agents: Anti-infectives (From admission, onward)    Start     Dose/Rate Route Frequency Ordered Stop   02/27/24 1800  azithromycin  (ZITHROMAX ) 500 mg in sodium chloride  0.9 % 250 mL IVPB  Status:  Discontinued        500 mg 250 mL/hr over 60 Minutes Intravenous Every 24 hours 02/27/24 1233 02/27/24 1339   02/27/24 1800  azithromycin  (ZITHROMAX ) 500 mg in sodium chloride  0.9 % 250 mL IVPB        500 mg 250 mL/hr over 60 Minutes Intravenous Every 24 hours 02/27/24 1339 02/29/24 1759   02/27/24 1330  cefTRIAXone  (ROCEPHIN ) 2 g in sodium chloride  0.9 % 100 mL IVPB        2 g 200 mL/hr over 30 Minutes Intravenous Every 24 hours 02/27/24 1233 03/03/24 1329   02/26/24 2345  cefTRIAXone  (ROCEPHIN ) 1 g in sodium chloride  0.9 % 100 mL IVPB        1 g 200 mL/hr over 30 Minutes Intravenous  Once 02/26/24 2337 02/27/24 0432   02/26/24 2345  azithromycin  (ZITHROMAX ) 500 mg in sodium chloride  0.9 % 250 mL IVPB        500 mg 250 mL/hr over 60 Minutes Intravenous  Once 02/26/24 2337 02/27/24 0432   02/26/24 0000  amoxicillin -clavulanate (AUGMENTIN ) 875-125 MG tablet        1 tablet Oral Every 12 hours 02/26/24 2353     02/26/24 0000  azithromycin  (ZITHROMAX ) 250 MG tablet        250 mg Oral Daily 02/26/24 2353          MEDICATIONS: Scheduled Meds:  albuterol   2.5 mg Nebulization Q4H   cyanocobalamin   1,000 mcg Oral Daily   DULoxetine   60 mg Oral BID   enoxaparin  (LOVENOX )  injection  40 mg Subcutaneous Q24H   escitalopram   10 mg Oral Daily   gabapentin   300 mg Oral 3 times per day   lamoTRIgine   150 mg Oral BID   levothyroxine   50 mcg Oral Q0600   primidone   50 mg Oral BID   rosuvastatin   40 mg Oral Daily   tiZANidine   8 mg Oral QHS   Continuous Infusions:  azithromycin  500 mg (02/27/24 1737)   cefTRIAXone  (ROCEPHIN )  IV 2 g (02/27/24 1321)   PRN Meds:.acetaminophen , albuterol , HYDROmorphone  (DILAUDID ) injection, ondansetron  (ZOFRAN ) IV, oxyCODONE    I have personally reviewed following labs and imaging studies  LABORATORY DATA: CBC: Recent Labs  Lab 02/26/24 2203 02/27/24 1344 02/28/24 0657  WBC 13.1* 12.1* 12.1*  NEUTROABS 9.8*  --   --   HGB 12.8 12.2 12.4  HCT 38.0 36.7 37.4  MCV 99.0 99.7 99.5  PLT 227 229 290    Basic Metabolic Panel: Recent Labs  Lab 02/26/24 2203 02/27/24 1344 02/28/24 0635  NA 133*  --  132*  K 4.9  --  4.3  CL 95*  --  99  CO2 24  --  21*  GLUCOSE 136*  --  156*  BUN 9  --  7*  CREATININE 0.74 0.72 0.56  CALCIUM  9.9  --  9.4    GFR: Estimated Creatinine Clearance: 83.2 mL/min (by C-G formula based on SCr of 0.56 mg/dL).  Liver Function Tests: Recent Labs  Lab 02/26/24 2203  AST 23  ALT 20  ALKPHOS 136*  BILITOT 0.6  PROT 7.0  ALBUMIN  4.5   Recent Labs  Lab 02/26/24 2203  LIPASE <10*   No results for input(s): AMMONIA in the last 168 hours.  Coagulation Profile: No results for input(s): INR, PROTIME in the last 168 hours.  Cardiac Enzymes: No results for input(s): CKTOTAL, CKMB, CKMBINDEX, TROPONINI in the last 168 hours.  BNP (last 3 results) No results for input(s): PROBNP in the last 8760 hours.  Lipid Profile: No results for input(s): CHOL, HDL, LDLCALC, TRIG, CHOLHDL, LDLDIRECT in the last 72 hours.  Thyroid  Function Tests: Recent Labs    02/27/24 1347  TSH 7.879*    Anemia Panel: No results for input(s): VITAMINB12, FOLATE,  FERRITIN, TIBC, IRON, RETICCTPCT in the last 72 hours.  Urine analysis:    Component Value Date/Time   COLORURINE YELLOW 08/19/2011 1500   APPEARANCEUR CLEAR 08/19/2011 1500   LABSPEC 1.008 08/19/2011 1500   PHURINE 8.0 08/19/2011 1500   GLUCOSEU NEGATIVE 08/19/2011 1500   HGBUR NEGATIVE 08/19/2011 1500   HGBUR large 07/23/2010 1505   BILIRUBINUR Unable to determine 08/19/2012 1039   KETONESUR NEGATIVE 08/19/2011 1500   PROTEINUR Unable to determine 08/19/2012 1039   PROTEINUR NEGATIVE 08/19/2011 1500   UROBILINOGEN  08/19/2012 1039     Comment:     Unable to determine due to Azo   UROBILINOGEN 0.2 08/19/2011 1500   NITRITE Unable to determine 08/19/2012 1039   NITRITE NEGATIVE 08/19/2011 1500   LEUKOCYTESUR moderate (2+) 08/19/2012 1039    Sepsis Labs: Lactic Acid, Venous    Component Value Date/Time   LATICACIDVEN 1.0 02/27/2024 1625    MICROBIOLOGY: Recent Results (from the past 240 hours)  Respiratory (~20 pathogens) panel by PCR     Status: None   Collection Time: 02/27/24  4:26 AM   Specimen: Nasopharyngeal Swab; Respiratory  Result Value Ref Range Status   Adenovirus NOT DETECTED NOT DETECTED Final   Coronavirus 229E NOT DETECTED NOT DETECTED Final    Comment: (NOTE) The Coronavirus on the Respiratory Panel, DOES NOT test for the novel  Coronavirus (2019 nCoV)    Coronavirus HKU1 NOT DETECTED NOT DETECTED Final   Coronavirus NL63 NOT DETECTED NOT DETECTED Final   Coronavirus OC43 NOT DETECTED NOT DETECTED Final   Metapneumovirus NOT DETECTED NOT DETECTED Final   Rhinovirus / Enterovirus NOT DETECTED NOT DETECTED Final   Influenza A NOT DETECTED NOT DETECTED Final   Influenza B NOT DETECTED NOT DETECTED Final   Parainfluenza Virus 1 NOT DETECTED NOT DETECTED Final   Parainfluenza Virus 2 NOT DETECTED NOT DETECTED Final   Parainfluenza Virus 3 NOT DETECTED NOT DETECTED Final   Parainfluenza Virus 4 NOT DETECTED NOT DETECTED Final   Respiratory  Syncytial Virus NOT DETECTED NOT DETECTED Final   Bordetella pertussis NOT DETECTED NOT DETECTED Final   Bordetella Parapertussis NOT DETECTED  NOT DETECTED Final   Chlamydophila pneumoniae NOT DETECTED NOT DETECTED Final   Mycoplasma pneumoniae NOT DETECTED NOT DETECTED Final    Comment: Performed at [facility], [address] [place], [place] [address]  Resp panel by RT-PCR (RSV, Flu A&B, Covid) Anterior Nasal Swab     Status: None   Collection Time: 02/27/24 12:34 PM   Specimen: Anterior Nasal Swab  Result Value Ref Range Status   SARS Coronavirus 2 by RT PCR NEGATIVE NEGATIVE Final   Influenza A by PCR NEGATIVE NEGATIVE Final   Influenza B by PCR NEGATIVE NEGATIVE Final    Comment: (NOTE) The Xpert Xpress SARS-CoV-2/FLU/RSV plus assay is intended as an aid in the diagnosis of influenza from Nasopharyngeal swab specimens and should not be used as a sole basis for treatment. Nasal washings and aspirates are unacceptable for Xpert Xpress SARS-CoV-2/FLU/RSV testing.  Fact Sheet for Patients: BloggerCourse.com  Fact Sheet for Healthcare Providers: SeriousBroker.it  This test is not yet approved or cleared by the [place]  FDA and has been authorized for detection and/or diagnosis of SARS-CoV-2 by FDA under an Emergency Use Authorization (EUA). This EUA will remain in effect (meaning this test can be used) for the duration of the COVID-19 declaration under Section 564(b)(1) of the Act, 21 U.S.C. section 360bbb-3(b)(1), unless the authorization is terminated or revoked.     Resp Syncytial Virus by PCR NEGATIVE NEGATIVE Final    Comment: (NOTE) Fact Sheet for Patients: BloggerCourse.com  Fact Sheet for Healthcare Providers: SeriousBroker.it  This test is not yet approved or cleared by the [place]  FDA and has been authorized for detection and/or diagnosis of  SARS-CoV-2 by FDA under an Emergency Use Authorization (EUA). This EUA will remain in effect (meaning this test can be used) for the duration of the COVID-19 declaration under Section 564(b)(1) of the Act, 21 U.S.C. section 360bbb-3(b)(1), unless the authorization is terminated or revoked.  Performed at [facility], [address] [place], [place] [address]   Expectorated Sputum Assessment w Gram Stain, Rflx to Resp Cult     Status: None   Collection Time: 02/27/24  1:34 PM   Specimen: Expectorated Sputum  Result Value Ref Range Status   Specimen Description EXPECTORATED SPUTUM  Final   Special Requests NONE  Final   Sputum evaluation   Final    THIS SPECIMEN IS ACCEPTABLE FOR SPUTUM CULTURE Performed at [facility], [address] [place], [place] [address]    Report Status 02/27/2024 FINAL  Final  Culture, Respiratory w Gram Stain     Status: None (Preliminary result)   Collection Time: 02/27/24  1:34 PM  Result Value Ref Range Status   Specimen Description EXPECTORATED SPUTUM  Final   Special Requests NONE Reflexed from [id number]  Final   Gram Stain   Final    FEW WBC PRESENT, PREDOMINANTLY PMN FEW GRAM POSITIVE COCCI Performed at [facility], [address] [place], [place] [address]    Culture PENDING  Incomplete   Report Status PENDING  Incomplete  Culture, blood (Routine X 2) w Reflex to ID Panel     Status: None (Preliminary result)   Collection Time: 02/27/24  1:44 PM   Specimen: BLOOD RIGHT HAND  Result Value Ref Range Status   Specimen Description BLOOD RIGHT HAND  Final   Special Requests   Final    BOTTLES DRAWN AEROBIC AND ANAEROBIC Blood Culture results may not be optimal due to an  inadequate volume of blood received in culture bottles   Culture   Final    NO GROWTH < 24 HOURS Performed at [facility], [address] [place], [place] [address]    Report Status PENDING  Incomplete  Culture, blood (Routine X 2) w Reflex to ID  Panel     Status: None (Preliminary result)   Collection Time: 02/27/24  1:47 PM   Specimen: BLOOD  Result Value Ref Range Status   Specimen Description BLOOD RIGHT ANTECUBITAL  Final   Special Requests   Final    BOTTLES DRAWN AEROBIC AND ANAEROBIC Blood Culture adequate volume   Culture   Final    NO GROWTH < 24 HOURS Performed at [facility], [address] [place], [place] [address]    Report Status PENDING  Incomplete    RADIOLOGY STUDIES/RESULTS: CT Chest Wo Contrast Result Date: 02/26/2024 EXAM: CT CHEST WITHOUT CONTRAST 02/26/2024 11:18:18 PM TECHNIQUE: CT of the chest was performed without the administration of intravenous contrast. Multiplanar reformatted images are provided for review. Automated exposure control, iterative reconstruction, and/or weight based adjustment of the mA/kV was utilized to reduce the radiation dose to as low as reasonably achievable. COMPARISON: Cardiac CT chest dated 10/16/2023. CLINICAL HISTORY: ILD. FINDINGS: MEDIASTINUM: Heart and pericardium are unremarkable. The central airways are clear. LYMPH NODES: No mediastinal, hilar or axillary lymphadenopathy. LUNGS AND PLEURA: Trace right pleural effusion. Linear/patchy opacities in the posterior right upper lobe and bilateral lower lobes, favoring scarring. Additional focal/patchy opacity in the posterior right middle lobe favoring pneumonia with areas of central low density/necrosis (image 77), new from prior. Mild centrilobular and paracentral emphysematous changes, upper lung predominant. Biapical pleural parenchymal scarring. SOFT TISSUES/BONES: No acute abnormality of the bones or soft tissues. UPPER ABDOMEN: Evaluated on dedicated CT abdomen/pelvis. IMPRESSION: 1. New focal/patchy opacity in the posterior right middle lobe, favoring pneumonia. Follow-up chest radiographs are suggested in 4-6 weeks to document resolution. 2. Trace right pleural effusion. Electronically signed by: Pinkie Pebbles MD  02/26/2024 11:33 PM EDT RP Workstation: [id number]   CT ABDOMEN PELVIS W CONTRAST Result Date: 02/26/2024 EXAM: CT ABDOMEN AND PELVIS WITH CONTRAST 02/26/2024 11:17:41 PM TECHNIQUE: CT of the abdomen and pelvis was performed with the administration of intravenous contrast. Multiplanar reformatted images are provided for review. Automated exposure control, iterative reconstruction, and/or weight based adjustment of the mA/kV was utilized to reduce the radiation dose to as low as reasonably achievable. COMPARISON: 07/08/2023 CLINICAL HISTORY: Abdominal pain, acute, nonlocalized. FINDINGS: LOWER CHEST: Trace right pleural effusion. Dedicated CT chest reported separately. LIVER: Subcentimeter probable central right hepatic cyst (image 29). GALLBLADDER AND BILE DUCTS: Layering small gallstones (image 30), without associated inflammatory changes. SPLEEN: No acute abnormality. PANCREAS: No acute abnormality. ADRENAL GLANDS: No acute abnormality. 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 No follow up is recommended. No stones in the kidneys or ureters. No hydronephrosis. No perinephric or periureteral stranding. Urinary bladder is unremarkable. GI AND BOWEL: Normal appendix (image 57). Stomach demonstrates no acute abnormality. There is no bowel obstruction. No bowel wall thickening. PERITONEUM AND RETROPERITONEUM: No ascites. No free air. VASCULATURE: Atherosclerotic calcifications of the abdominal aorta and branch vessels, although patent. LYMPH NODES: No lymphadenopathy. REPRODUCTIVE ORGANS: No acute abnormality. BONES AND SOFT TISSUES: Mild degenerative changes of the visualized thoracolumbar spine. No focal soft tissue abnormality. IMPRESSION: 1. No acute findings in the abdomen or pelvis. 2. Normal appendix.  3. Trace right pleural effusion. Dedicated CT chest reported separately. Electronically signed by:  Pinkie Pebbles MD 02/26/2024 11:24 PM EDT RP Workstation: [id number]     LOS: 1 day   Donalda Applebaum, MD  [facility]    To contact the attending provider between 7A-7P or the covering provider during after hours 7P-7A, please log into the web site [email or web address] and access using universal [place] password for that web site. If you do not have the password, please call the hospital operator.  02/28/2024, 9:02 AM

## 2024-02-29 ENCOUNTER — Inpatient Hospital Stay (HOSPITAL_COMMUNITY)

## 2024-02-29 DIAGNOSIS — K8020 Calculus of gallbladder without cholecystitis without obstruction: Secondary | ICD-10-CM | POA: Diagnosis not present

## 2024-02-29 DIAGNOSIS — R1011 Right upper quadrant pain: Secondary | ICD-10-CM | POA: Diagnosis not present

## 2024-02-29 DIAGNOSIS — J9601 Acute respiratory failure with hypoxia: Secondary | ICD-10-CM | POA: Diagnosis not present

## 2024-02-29 DIAGNOSIS — J189 Pneumonia, unspecified organism: Secondary | ICD-10-CM | POA: Diagnosis not present

## 2024-02-29 LAB — COMPREHENSIVE METABOLIC PANEL WITH GFR
ALT: 16 U/L (ref 0–44)
AST: 21 U/L (ref 15–41)
Albumin: 3.2 g/dL — ABNORMAL LOW (ref 3.5–5.0)
Alkaline Phosphatase: 84 U/L (ref 38–126)
Anion gap: 9 (ref 5–15)
BUN: 6 mg/dL — ABNORMAL LOW (ref 8–23)
CO2: 26 mmol/L (ref 22–32)
Calcium: 9 mg/dL (ref 8.9–10.3)
Chloride: 99 mmol/L (ref 98–111)
Creatinine, Ser: 0.69 mg/dL (ref 0.44–1.00)
GFR, Estimated: >60 mL/min (ref >60)
Glucose, Bld: 98 mg/dL (ref 70–99)
Potassium: 4.1 mmol/L (ref 3.5–5.1)
Sodium: 134 mmol/L — ABNORMAL LOW (ref 135–145)
Total Bilirubin: 0.8 mg/dL (ref 0.0–1.2)
Total Protein: 5.9 g/dL — ABNORMAL LOW (ref 6.5–8.1)

## 2024-02-29 LAB — CBC
HCT: 30.3 % — ABNORMAL LOW (ref 36.0–46.0)
Hemoglobin: 9.8 g/dL — ABNORMAL LOW (ref 12.0–15.0)
MCH: 33 pg (ref 26.0–34.0)
MCHC: 32.3 g/dL (ref 30.0–36.0)
MCV: 102 fL — ABNORMAL HIGH (ref 80.0–100.0)
Platelets: 210 K/uL (ref 150–400)
RBC: 2.97 MIL/uL — ABNORMAL LOW (ref 3.87–5.11)
RDW: 12.3 % (ref 11.5–15.5)
WBC: 5.2 K/uL (ref 4.0–10.5)
nRBC: 0 % (ref 0.0–0.2)

## 2024-02-29 LAB — LACTIC ACID, PLASMA
Lactic Acid, Venous: 0.9 mmol/L (ref 0.5–1.9)
Lactic Acid, Venous: 0.9 mmol/L (ref 0.5–1.9)

## 2024-02-29 MED ORDER — LACTATED RINGERS IV BOLUS
1000.0000 mL | INTRAVENOUS | Status: AC
  Administered 2024-02-29: 1000 mL via INTRAVENOUS

## 2024-02-29 MED ORDER — LACTATED RINGERS IV SOLN: INTRAVENOUS | Status: AC

## 2024-02-29 MED ORDER — VANCOMYCIN HCL 2000 MG/400ML IV SOLN
2000.0000 mg | Freq: Once | INTRAVENOUS | Status: AC
  Administered 2024-02-29: 2000 mg via INTRAVENOUS
  Filled 2024-02-29: qty 400

## 2024-02-29 MED ORDER — HYDROMORPHONE HCL 1 MG/ML IJ SOLN
0.5000 mg | INTRAMUSCULAR | Status: DC | PRN
  Administered 2024-02-29 (×2): 1 mg via INTRAVENOUS
  Filled 2024-02-29 (×2): qty 1

## 2024-02-29 MED ORDER — MORPHINE SULFATE (PF) 4 MG/ML IV SOLN
INTRAVENOUS | Status: AC
  Filled 2024-02-29: qty 1

## 2024-02-29 MED ORDER — ALBUMIN HUMAN 25 % IV SOLN
25.0000 g | INTRAVENOUS | Status: AC
  Administered 2024-02-29: 25 g via INTRAVENOUS
  Filled 2024-02-29: qty 100

## 2024-02-29 MED ORDER — MIDODRINE HCL 5 MG PO TABS
10.0000 mg | ORAL_TABLET | ORAL | Status: AC
  Administered 2024-02-29: 10 mg via ORAL
  Filled 2024-02-29: qty 2

## 2024-02-29 MED ORDER — LACTATED RINGERS IV BOLUS
1000.0000 mL | Freq: Once | INTRAVENOUS | Status: AC
  Administered 2024-02-29: 1000 mL via INTRAVENOUS

## 2024-02-29 MED ORDER — VANCOMYCIN HCL 1500 MG/300ML IV SOLN
1500.0000 mg | INTRAVENOUS | Status: DC
  Administered 2024-03-01 – 2024-03-02 (×2): 1500 mg via INTRAVENOUS
  Filled 2024-02-29 (×2): qty 300

## 2024-02-29 MED ORDER — MIDODRINE HCL 5 MG PO TABS
10.0000 mg | ORAL_TABLET | Freq: Three times a day (TID) | ORAL | Status: AC
  Administered 2024-02-29: 10 mg via ORAL
  Filled 2024-02-29: qty 2

## 2024-02-29 MED ORDER — TECHNETIUM TC 99M MEBROFENIN IV KIT
5.5000 | PACK | Freq: Once | INTRAVENOUS | Status: AC | PRN
  Administered 2024-02-29: 5.5 via INTRAVENOUS

## 2024-02-29 MED ORDER — MORPHINE SULFATE (PF) 4 MG/ML IV SOLN
3.0000 mg | Freq: Once | INTRAVENOUS | Status: AC
  Administered 2024-02-29: 3 mg via INTRAVENOUS

## 2024-02-29 NOTE — Plan of Care (Signed)

## 2024-02-29 NOTE — Progress Notes (Addendum)
 Pharmacy Antibiotic Note  Jamie Patterson is a 63 y.o. female admitted on 02/26/2024 with pneumonia.  Pharmacy has been consulted for Vancomycin  dosing.  Plan: Loading dose: Vancomycin  2g IV x1 Vancomycin  1.5g IV every 24 hours  Used Scr 0.8, IBW, Vd coefficient 0.5  Height: 5' 5 (165.1 cm) Weight: 95.3 kg (210 lb) IBW/kg (Calculated) : 57  Temp (24hrs), Avg:98.2 F (36.8 C), Min:97.7 F (36.5 C), Max:98.5 F (36.9 C)  Recent Labs  Lab 02/26/24 2203 02/27/24 1344 02/27/24 1625 02/28/24 0635 02/28/24 0657 02/29/24 0430 02/29/24 0610  WBC 13.1* 12.1*  --   --  12.1* 5.2  --   CREATININE 0.74 0.72  --  0.56  --  0.69  --   LATICACIDVEN  --   --  1.0  --   --  0.9 0.9    Estimated Creatinine Clearance: 83.2 mL/min (by C-G formula based on SCr of 0.69 mg/dL).    Allergies  Allergen Reactions   Amlodipine  Swelling    Ankle edema    Microbiology results: 7/19 BCx: NGTD 7/19 Sputum: Staph. aureus   Thank you for allowing pharmacy to be a part of this patient's care.  Cebastian Neis M Daria Mcmeekin 02/29/2024 9:42 AM

## 2024-02-29 NOTE — Progress Notes (Signed)
 Transition of Care [facility]) - Inpatient Brief Assessment   Patient Details  Name: Jamie Patterson MRN: [id number] Date of Birth: 09/29/1960  [facility] CM/SW Contact:    Robynn Eileen Hoose, RN Phone Number: 02/29/2024, 2:48 PM   Clinical Narrative:  Patient from home with c/o right upper quadrant abdominal pain and diarrhea. Patient to have HIDA scan done.   Transition of Care Asessment: Insurance and Status: (P) Insurance coverage has been reviewed Patient has primary care physician: (P) Yes Home environment has been reviewed: (P) Home Prior level of function:: (P) Independent Prior/Current Home Services: (P) No current home services Social Drivers of Health Review: (P) SDOH reviewed no interventions necessary Readmission risk has been reviewed: (P) Yes Transition of care needs: (P) no transition of care needs at this time

## 2024-02-29 NOTE — Progress Notes (Addendum)
 PROGRESS NOTE        PATIENT DETAILS Name: Jamie Patterson Age: 63 y.o. Sex: female Date of Birth: 01-01-1961 Admit Date: 02/26/2024 Admitting Physician Velna JONELLE Skeeter, MD ERE:Rojmx, Comer POUR, NP  Brief Summary: Patient is a 63 y.o.  female with history of hypothyroidism, HTN, HLD, essential tremor, chronic neuropathic pain, depression/anxiety-who presented with 3-4-day history of RUQ pain/worsening cough.  She was thought to have pneumonia and subsequently admitted to the hospitalist service.  Significant events: 7/18>> admit to [facility]  Significant studies: 7/18>> CT chest: Patchy opacity-right middle lobe. 7/18>> CT abdomen/pelvis: Layering gallstones-without inflammatory changes.  No acute findings.  Significant microbiology data: 7/19>> blood culture: No growth 7/19>> COVID/RSV/respiratory virus panel: Negative 7/19>> respiratory virus panel: Negative 7/19>> sputum culture:Staph Aureus-sensitivities pending.  Procedures: None  Consults: CCS  Subjective: Appears more comfortable than yesterday-continues to RUQ pain-although better controlled with increased dosage of Dilaudid .  No nausea today.  Objective: Vitals: Blood pressure 109/77, pulse 82, temperature 97.7 F (36.5 C), temperature source Oral, resp. rate 17, height 5' 5 (1.651 m), weight 95.3 kg, last menstrual period 06/01/2013, SpO2 94%.   Exam: Awake/alert Atraumatic/normocephalic Chest: Clear to auscultation CVS: S1-S2 regular Abdomen: Soft-some tenderness in the RUQ area-but no rebound.  Extremities: No edema Nonfocal exam.  Pertinent Labs/Radiology:    Latest Ref Rng & Units 02/29/2024    4:30 AM 02/28/2024    6:57 AM 02/27/2024    1:44 PM  CBC  WBC 4.0 - 10.5 K/uL 5.2  12.1  12.1   Hemoglobin 12.0 - 15.0 g/dL 9.8  87.5  87.7   Hematocrit 36.0 - 46.0 % 30.3  37.4  36.7   Platelets 150 - 400 K/uL 210  290  229     Lab Results  Component Value Date   NA 134 (L) 02/29/2024    K 4.1 02/29/2024   CL 99 02/29/2024   CO2 26 02/29/2024     Assessment/Plan: Acute hypoxemic respiratory failure secondary to right lobar pneumonia Somewhat better today-less pain-cough better-however pain is still somewhat pleuritic Leukocytosis has resolved Sputum cultures positive for Staph aureus-sensitivity pending Add vancomycin  Continue Rocephin  Has completed 3 days of Zithromax   RUQ pain Suspicion that this is likely from PNA-as pain is mostly pleuritic and not very typical for gallbladder etiology.  However the degree of pain seems very disproportionate for PNA. Has known cholelithiasis-however no gallbladder thickening/signs of inflammation on CT and RUQ ultrasound.  Given severity of pain-HIDA scan ordered-pending today. On empiric Rocephin /Flagyl  until gallbladder issues are ruled out.   Thankfully pain is better controlled today compared to yesterday-continue as needed narcotics. CCS following.  Known history of cholelithiasis Supportive care See above  HLD Statin  HTN BP creeping up Resume Coreg  at half of home dose Continue to hold Benicar .  Hypothyroidism Continue Synthroid   Neuropathic iron/chronic diarrhea On Neurontin  at significant lower dose than home dose.    Anxiety/depression Somewhat worse due to pain/hospitalization Continue Lexapro /Cymbalta .  History of intention tremor Primidone   Class 1 Obesity: Estimated body mass index is 34.95 kg/m as calculated from the following:   Height as of this encounter: 5' 5 (1.651 m).   Weight as of this encounter: 95.3 kg.   Code status:   Code Status: Full Code   DVT Prophylaxis: enoxaparin  (LOVENOX ) injection 40 mg Start: 02/27/24 1400 SCDs Start: 02/27/24 1231  Family Communication: Spouse at bedside   Disposition Plan: Status is: Inpatient Remains inpatient appropriate because: Severity of illness   Planned Discharge Destination:Home   Diet: Diet Order             Diet NPO  time specified  Diet effective now                     Antimicrobial agents: Anti-infectives (From admission, onward)    Start     Dose/Rate Route Frequency Ordered Stop   02/28/24 1800  azithromycin  (ZITHROMAX ) tablet 500 mg        500 mg Oral  Once 02/28/24 0959 02/28/24 1807   02/28/24 1300  metroNIDAZOLE  (FLAGYL ) IVPB 500 mg        500 mg 100 mL/hr over 60 Minutes Intravenous Every 12 hours 02/28/24 1210     02/27/24 1800  azithromycin  (ZITHROMAX ) 500 mg in sodium chloride  0.9 % 250 mL IVPB  Status:  Discontinued        500 mg 250 mL/hr over 60 Minutes Intravenous Every 24 hours 02/27/24 1233 02/27/24 1339   02/27/24 1800  azithromycin  (ZITHROMAX ) 500 mg in sodium chloride  0.9 % 250 mL IVPB  Status:  Discontinued        500 mg 250 mL/hr over 60 Minutes Intravenous Every 24 hours 02/27/24 1339 02/28/24 0959   02/27/24 1330  cefTRIAXone  (ROCEPHIN ) 2 g in sodium chloride  0.9 % 100 mL IVPB        2 g 200 mL/hr over 30 Minutes Intravenous Every 24 hours 02/27/24 1233 03/03/24 1329   02/26/24 2345  cefTRIAXone  (ROCEPHIN ) 1 g in sodium chloride  0.9 % 100 mL IVPB        1 g 200 mL/hr over 30 Minutes Intravenous  Once 02/26/24 2337 02/27/24 0432   02/26/24 2345  azithromycin  (ZITHROMAX ) 500 mg in sodium chloride  0.9 % 250 mL IVPB        500 mg 250 mL/hr over 60 Minutes Intravenous  Once 02/26/24 2337 02/27/24 0432   02/26/24 0000  amoxicillin -clavulanate (AUGMENTIN ) 875-125 MG tablet        1 tablet Oral Every 12 hours 02/26/24 2353     02/26/24 0000  azithromycin  (ZITHROMAX ) 250 MG tablet        250 mg Oral Daily 02/26/24 2353          MEDICATIONS: Scheduled Meds:  acetaminophen   1,000 mg Oral Q6H   albuterol   2.5 mg Nebulization Q6H   cyanocobalamin   1,000 mcg Oral Daily   DULoxetine   60 mg Oral BID   enoxaparin  (LOVENOX ) injection  40 mg Subcutaneous Q24H   escitalopram   10 mg Oral Daily   gabapentin   300 mg Oral 3 times per day   ketorolac   15 mg Intravenous Q6H    lamoTRIgine   150 mg Oral BID   levothyroxine   50 mcg Oral Q0600   lidocaine   1 patch Transdermal Q24H   midodrine   10 mg Oral TID WC   polyethylene glycol  17 g Oral Daily   primidone   50 mg Oral BID   rosuvastatin   40 mg Oral Daily   tiZANidine   8 mg Oral QHS   Continuous Infusions:  cefTRIAXone  (ROCEPHIN )  IV 2 g (02/28/24 1254)   lactated ringers  150 mL/hr at 02/29/24 0807   metronidazole  500 mg (02/29/24 0016)   promethazine  (PHENERGAN ) injection (IM or IVPB) 12.5 mg (02/28/24 2124)   PRN Meds:.albuterol , ALPRAZolam , bisacodyl , diphenhydrAMINE , HYDROmorphone  (DILAUDID ) injection, naLOXone  (NARCAN )  injection,  ondansetron  (ZOFRAN ) IV, oxyCODONE , promethazine  (PHENERGAN ) injection (IM or IVPB)   I have personally reviewed following labs and imaging studies  LABORATORY DATA: CBC: Recent Labs  Lab 02/26/24 2203 02/27/24 1344 02/28/24 0657 02/29/24 0430  WBC 13.1* 12.1* 12.1* 5.2  NEUTROABS 9.8*  --   --   --   HGB 12.8 12.2 12.4 9.8*  HCT 38.0 36.7 37.4 30.3*  MCV 99.0 99.7 99.5 102.0*  PLT 227 229 290 210    Basic Metabolic Panel: Recent Labs  Lab 02/26/24 2203 02/27/24 1344 02/28/24 0635 02/29/24 0430  NA 133*  --  132* 134*  K 4.9  --  4.3 4.1  CL 95*  --  99 99  CO2 24  --  21* 26  GLUCOSE 136*  --  156* 98  BUN 9  --  7* 6*  CREATININE 0.74 0.72 0.56 0.69  CALCIUM  9.9  --  9.4 9.0    GFR: Estimated Creatinine Clearance: 83.2 mL/min (by C-G formula based on SCr of 0.69 mg/dL).  Liver Function Tests: Recent Labs  Lab 02/26/24 2203 02/28/24 1243 02/29/24 0430  AST 23 24 21   ALT 20 17 16   ALKPHOS 136* 112 84  BILITOT 0.6 0.9 0.8  PROT 7.0 7.0 5.9*  ALBUMIN  4.5 3.1* 3.2*   Recent Labs  Lab 02/26/24 2203 02/28/24 1243  LIPASE <10* 22   No results for input(s): AMMONIA in the last 168 hours.  Coagulation Profile: Recent Labs  Lab 02/28/24 1243  INR 1.2    Cardiac Enzymes: No results for input(s): CKTOTAL, CKMB, CKMBINDEX,  TROPONINI in the last 168 hours.  BNP (last 3 results) No results for input(s): PROBNP in the last 8760 hours.  Lipid Profile: No results for input(s): CHOL, HDL, LDLCALC, TRIG, CHOLHDL, LDLDIRECT in the last 72 hours.  Thyroid  Function Tests: Recent Labs    02/27/24 1347  TSH 7.879*    Anemia Panel: No results for input(s): VITAMINB12, FOLATE, FERRITIN, TIBC, IRON, RETICCTPCT in the last 72 hours.  Urine analysis:    Component Value Date/Time   COLORURINE YELLOW 08/19/2011 1500   APPEARANCEUR CLEAR 08/19/2011 1500   LABSPEC 1.008 08/19/2011 1500   PHURINE 8.0 08/19/2011 1500   GLUCOSEU NEGATIVE 08/19/2011 1500   HGBUR NEGATIVE 08/19/2011 1500   HGBUR large 07/23/2010 1505   BILIRUBINUR Unable to determine 08/19/2012 1039   KETONESUR NEGATIVE 08/19/2011 1500   PROTEINUR Unable to determine 08/19/2012 1039   PROTEINUR NEGATIVE 08/19/2011 1500   UROBILINOGEN  08/19/2012 1039     Comment:     Unable to determine due to Azo   UROBILINOGEN 0.2 08/19/2011 1500   NITRITE Unable to determine 08/19/2012 1039   NITRITE NEGATIVE 08/19/2011 1500   LEUKOCYTESUR moderate (2+) 08/19/2012 1039    Sepsis Labs: Lactic Acid, Venous    Component Value Date/Time   LATICACIDVEN 0.9 02/29/2024 0610    MICROBIOLOGY: Recent Results (from the past 240 hours)  Respiratory (~20 pathogens) panel by PCR     Status: None   Collection Time: 02/27/24  4:26 AM   Specimen: Nasopharyngeal Swab; Respiratory  Result Value Ref Range Status   Adenovirus NOT DETECTED NOT DETECTED Final   Coronavirus 229E NOT DETECTED NOT DETECTED Final    Comment: (NOTE) The Coronavirus on the Respiratory Panel, DOES NOT test for the novel  Coronavirus (2019 nCoV)    Coronavirus HKU1 NOT DETECTED NOT DETECTED Final   Coronavirus NL63 NOT DETECTED NOT DETECTED Final   Coronavirus OC43 NOT DETECTED NOT  DETECTED Final   Metapneumovirus NOT DETECTED NOT DETECTED Final   Rhinovirus /  Enterovirus NOT DETECTED NOT DETECTED Final   Influenza A NOT DETECTED NOT DETECTED Final   Influenza B NOT DETECTED NOT DETECTED Final   Parainfluenza Virus 1 NOT DETECTED NOT DETECTED Final   Parainfluenza Virus 2 NOT DETECTED NOT DETECTED Final   Parainfluenza Virus 3 NOT DETECTED NOT DETECTED Final   Parainfluenza Virus 4 NOT DETECTED NOT DETECTED Final   Respiratory Syncytial Virus NOT DETECTED NOT DETECTED Final   Bordetella pertussis NOT DETECTED NOT DETECTED Final   Bordetella Parapertussis NOT DETECTED NOT DETECTED Final   Chlamydophila pneumoniae NOT DETECTED NOT DETECTED Final   Mycoplasma pneumoniae NOT DETECTED NOT DETECTED Final    Comment: Performed at [facility], [address], [place], [place] [address]  Resp panel by RT-PCR (RSV, Flu A&B, Covid) Anterior Nasal Swab     Status: None   Collection Time: 02/27/24 12:34 PM   Specimen: Anterior Nasal Swab  Result Value Ref Range Status   SARS Coronavirus 2 by RT PCR NEGATIVE NEGATIVE Final   Influenza A by PCR NEGATIVE NEGATIVE Final   Influenza B by PCR NEGATIVE NEGATIVE Final    Comment: (NOTE) The Xpert Xpress SARS-CoV-2/FLU/RSV plus assay is intended as an aid in the diagnosis of influenza from Nasopharyngeal swab specimens and should not be used as a sole basis for treatment. Nasal washings and aspirates are unacceptable for Xpert Xpress SARS-CoV-2/FLU/RSV testing.  Fact Sheet for Patients: BloggerCourse.com  Fact Sheet for Healthcare Providers: SeriousBroker.it  This test is not yet approved or cleared by the [place]  FDA and has been authorized for detection and/or diagnosis of SARS-CoV-2 by FDA under an Emergency Use Authorization (EUA). This EUA will remain in effect (meaning this test can be used) for the duration of the COVID-19 declaration under Section 564(b)(1) of the Act, 21 U.S.C. section 360bbb-3(b)(1), unless the authorization is  terminated or revoked.     Resp Syncytial Virus by PCR NEGATIVE NEGATIVE Final    Comment: (NOTE) Fact Sheet for Patients: BloggerCourse.com  Fact Sheet for Healthcare Providers: SeriousBroker.it  This test is not yet approved or cleared by the [place]  FDA and has been authorized for detection and/or diagnosis of SARS-CoV-2 by FDA under an Emergency Use Authorization (EUA). This EUA will remain in effect (meaning this test can be used) for the duration of the COVID-19 declaration under Section 564(b)(1) of the Act, 21 U.S.C. section 360bbb-3(b)(1), unless the authorization is terminated or revoked.  Performed at [facility], [address] [place], [place] [address]   Expectorated Sputum Assessment w Gram Stain, Rflx to Resp Cult     Status: None   Collection Time: 02/27/24  1:34 PM   Specimen: Expectorated Sputum  Result Value Ref Range Status   Specimen Description EXPECTORATED SPUTUM  Final   Special Requests NONE  Final   Sputum evaluation   Final    THIS SPECIMEN IS ACCEPTABLE FOR SPUTUM CULTURE Performed at [facility], [address], [place], [place] [address]    Report Status 02/27/2024 FINAL  Final  Culture, Respiratory w Gram Stain     Status: None (Preliminary result)   Collection Time: 02/27/24  1:34 PM  Result Value Ref Range Status   Specimen Description EXPECTORATED SPUTUM  Final   Special Requests NONE Reflexed from [id number]  Final   Gram Stain   Final    FEW WBC PRESENT, PREDOMINANTLY PMN  FEW GRAM POSITIVE COCCI    Culture   Final    ABUNDANT STAPHYLOCOCCUS AUREUS CULTURE REINCUBATED FOR BETTER GROWTH Performed at [facility], [address], [place], [place] [address]    Report Status PENDING  Incomplete  Culture, blood (Routine X 2) w Reflex to ID Panel     Status: None (Preliminary result)   Collection Time: 02/27/24  1:44 PM   Specimen: BLOOD RIGHT HAND  Result Value  Ref Range Status   Specimen Description BLOOD RIGHT HAND  Final   Special Requests   Final    BOTTLES DRAWN AEROBIC AND ANAEROBIC Blood Culture results may not be optimal due to an inadequate volume of blood received in culture bottles   Culture   Final    NO GROWTH 2 DAYS Performed at [facility], [address], [place], [place] [address]    Report Status PENDING  Incomplete  Culture, blood (Routine X 2) w Reflex to ID Panel     Status: None (Preliminary result)   Collection Time: 02/27/24  1:47 PM   Specimen: BLOOD  Result Value Ref Range Status   Specimen Description BLOOD RIGHT ANTECUBITAL  Final   Special Requests   Final    BOTTLES DRAWN AEROBIC AND ANAEROBIC Blood Culture adequate volume   Culture   Final    NO GROWTH 2 DAYS Performed at [facility], [address] [place], [place] [address]    Report Status PENDING  Incomplete    RADIOLOGY STUDIES/RESULTS: US  Abdomen Limited RUQ (LIVER/GB) Result Date: 02/28/2024 CLINICAL DATA:  Right upper quadrant pain EXAM: ULTRASOUND ABDOMEN LIMITED RIGHT UPPER QUADRANT COMPARISON:  CT 02/26/2024. FINDINGS: Gallbladder: Distended gallbladder. No wall thickening or adjacent fluid. Dependent stones. The sonographer does report pain when scanning the gallbladder. Common bile duct: Diameter: 3 mm Liver: No focal lesion identified. Within normal limits in parenchymal echogenicity. Portal vein is patent on color Doppler imaging with normal direction of blood flow towards the liver. Other: None. IMPRESSION: Distended gallbladder with stones. The sonographer does report pain when scanning the gallbladder, Murphy's sign. No wall thickening or adjacent fluid or ductal dilatation. If there is further clinical concern of acute cholecystitis confirmatory HIDA scan could be considered. Electronically Signed   By: Ranell Bring M.D.   On: 02/28/2024 10:23     LOS: 2 days   Donalda Applebaum, MD  [facility]    To contact the  attending provider between 7A-7P or the covering provider during after hours 7P-7A, please log into the web site [email or web address] and access using universal [place] password for that web site. If you do not have the password, please call the hospital operator.  02/29/2024, 9:33 AM

## 2024-02-29 NOTE — Plan of Care (Addendum)
 Patient's blood pressure is soft. Giving 1 L of LR bolus, albumin  and after that starting maintenance fluid LR 150 cc/h.  Blood pressure is still in lower range giving rrd bolus of LR second round of albumin  25 g and giving midodrine  10 mg..  Holding the Coreg .  Jamie Aiken, MD [facility] 02/29/2024, 12:25 AM

## 2024-02-29 NOTE — Progress Notes (Signed)
 Subjective/Chief Complaint: Pt with no acute changes HIDA pending   Objective: Vital signs in last 24 hours: Temp:  [97.7 F (36.5 C)-98.5 F (36.9 C)] 97.7 F (36.5 C) (07/21 0826) Pulse Rate:  [70-93] 82 (07/21 0826) Resp:  [17-18] 17 (07/21 0826) BP: (74-142)/(45-103) 109/77 (07/21 0826) SpO2:  [91 %-96 %] 94 % (07/21 0826) FiO2 (%):  [28 %] 28 % (07/21 0308) Last BM Date : 02/26/24  Intake/Output from previous day: 07/20 0701 - 07/21 0700 In: 2010.5 [I.V.:478.8; IV Piggyback:1531.7] Out: 300 [Urine:300] Intake/Output this shift: Total I/O In: 1497.5 [I.V.:142.5; IV Piggyback:1355] Out: -   PE:  Constitutional: No acute distress, conversant, appears states age. Eyes: Anicteric sclerae, moist conjunctiva, no lid lag Lungs: Clear to auscultation bilaterally, normal respiratory effort CV: regular rate and rhythm, no murmurs, no peripheral edema, pedal pulses 2+ GI: Soft, no masses or hepatosplenomegaly, tender to palpation RUQ Skin: No rashes, palpation reveals normal turgor Psychiatric: appropriate judgment and insight, oriented to person, place, and time   Lab Results:  Recent Labs    02/28/24 0657 02/29/24 0430  WBC 12.1* 5.2  HGB 12.4 9.8*  HCT 37.4 30.3*  PLT 290 210   BMET Recent Labs    02/28/24 0635 02/29/24 0430  NA 132* 134*  K 4.3 4.1  CL 99 99  CO2 21* 26  GLUCOSE 156* 98  BUN 7* 6*  CREATININE 0.56 0.69  CALCIUM  9.4 9.0   PT/INR Recent Labs    02/28/24 1243  LABPROT 15.4*  INR 1.2   ABG No results for input(s): PHART, HCO3 in the last 72 hours.  Invalid input(s): PCO2, PO2  Studies/Results: US  Abdomen Limited RUQ (LIVER/GB) Result Date: 02/28/2024 CLINICAL DATA:  Right upper quadrant pain EXAM: ULTRASOUND ABDOMEN LIMITED RIGHT UPPER QUADRANT COMPARISON:  CT 02/26/2024. FINDINGS: Gallbladder: Distended gallbladder. No wall thickening or adjacent fluid. Dependent stones. The sonographer does report pain when scanning  the gallbladder. Common bile duct: Diameter: 3 mm Liver: No focal lesion identified. Within normal limits in parenchymal echogenicity. Portal vein is patent on color Doppler imaging with normal direction of blood flow towards the liver. Other: None. IMPRESSION: Distended gallbladder with stones. The sonographer does report pain when scanning the gallbladder, Murphy's sign. No wall thickening or adjacent fluid or ductal dilatation. If there is further clinical concern of acute cholecystitis confirmatory HIDA scan could be considered. Electronically Signed   By: Ranell Bring M.D.   On: 02/28/2024 10:23    Anti-infectives: Anti-infectives (From admission, onward)    Start     Dose/Rate Route Frequency Ordered Stop   03/01/24 1000  vancomycin  (VANCOREADY) IVPB 1500 mg/300 mL        1,500 mg 150 mL/hr over 120 Minutes Intravenous Every 24 hours 02/29/24 0950     02/29/24 1045  vancomycin  (VANCOREADY) IVPB 2000 mg/400 mL        2,000 mg 200 mL/hr over 120 Minutes Intravenous  Once 02/29/24 0950     02/28/24 1800  azithromycin  (ZITHROMAX ) tablet 500 mg        500 mg Oral  Once 02/28/24 0959 02/28/24 1807   02/28/24 1300  metroNIDAZOLE  (FLAGYL ) IVPB 500 mg        500 mg 100 mL/hr over 60 Minutes Intravenous Every 12 hours 02/28/24 1210     02/27/24 1800  azithromycin  (ZITHROMAX ) 500 mg in sodium chloride  0.9 % 250 mL IVPB  Status:  Discontinued        500 mg 250 mL/hr over  60 Minutes Intravenous Every 24 hours 02/27/24 1233 02/27/24 1339   02/27/24 1800  azithromycin  (ZITHROMAX ) 500 mg in sodium chloride  0.9 % 250 mL IVPB  Status:  Discontinued        500 mg 250 mL/hr over 60 Minutes Intravenous Every 24 hours 02/27/24 1339 02/28/24 0959   02/27/24 1330  cefTRIAXone  (ROCEPHIN ) 2 g in sodium chloride  0.9 % 100 mL IVPB        2 g 200 mL/hr over 30 Minutes Intravenous Every 24 hours 02/27/24 1233 03/03/24 1329   02/26/24 2345  cefTRIAXone  (ROCEPHIN ) 1 g in sodium chloride  0.9 % 100 mL IVPB        1  g 200 mL/hr over 30 Minutes Intravenous  Once 02/26/24 2337 02/27/24 0432   02/26/24 2345  azithromycin  (ZITHROMAX ) 500 mg in sodium chloride  0.9 % 250 mL IVPB        500 mg 250 mL/hr over 60 Minutes Intravenous  Once 02/26/24 2337 02/27/24 0432   02/26/24 0000  amoxicillin -clavulanate (AUGMENTIN ) 875-125 MG tablet        1 tablet Oral Every 12 hours 02/26/24 2353     02/26/24 0000  azithromycin  (ZITHROMAX ) 250 MG tablet        250 mg Oral Daily 02/26/24 2353         Assessment/Plan: Jamie Patterson is an 63 y.o. female with pneumonia, cholelithiasis, and concern for cholecystitis  -await results of HIDA -Pt on Abx -following  LOS: 2 days    [name] 02/29/2024

## 2024-02-29 NOTE — Plan of Care (Signed)
 Blood pressure has been improved MAP is around 69 after receiving 2 to 3 L of LR bolus and midodrine  10 mg, albumin  50 g. -Continue maintenance fluid LR 150 cc/h and initiating midodrine  10 mg 3 times daily in the setting of persistent hypotension.  Shigeo Baugh, MD [facility] 02/29/2024, 5:00 AM

## 2024-03-01 DIAGNOSIS — R1011 Right upper quadrant pain: Secondary | ICD-10-CM | POA: Diagnosis not present

## 2024-03-01 DIAGNOSIS — J189 Pneumonia, unspecified organism: Secondary | ICD-10-CM | POA: Diagnosis not present

## 2024-03-01 DIAGNOSIS — J9601 Acute respiratory failure with hypoxia: Secondary | ICD-10-CM | POA: Diagnosis not present

## 2024-03-01 DIAGNOSIS — K8020 Calculus of gallbladder without cholecystitis without obstruction: Secondary | ICD-10-CM | POA: Diagnosis not present

## 2024-03-01 LAB — COMPREHENSIVE METABOLIC PANEL WITH GFR
ALT: 21 U/L (ref 0–44)
AST: 31 U/L (ref 15–41)
Albumin: 3.2 g/dL — ABNORMAL LOW (ref 3.5–5.0)
Alkaline Phosphatase: 108 U/L (ref 38–126)
Anion gap: 11 (ref 5–15)
BUN: 5 mg/dL — ABNORMAL LOW (ref 8–23)
CO2: 26 mmol/L (ref 22–32)
Calcium: 9.5 mg/dL (ref 8.9–10.3)
Chloride: 98 mmol/L (ref 98–111)
Creatinine, Ser: 0.77 mg/dL (ref 0.44–1.00)
GFR, Estimated: >60 mL/min (ref >60)
Glucose, Bld: 76 mg/dL (ref 70–99)
Potassium: 3.7 mmol/L (ref 3.5–5.1)
Sodium: 135 mmol/L (ref 135–145)
Total Bilirubin: 0.8 mg/dL (ref 0.0–1.2)
Total Protein: 6.5 g/dL (ref 6.5–8.1)

## 2024-03-01 LAB — CBC
HCT: 33.3 % — ABNORMAL LOW (ref 36.0–46.0)
Hemoglobin: 11.4 g/dL — ABNORMAL LOW (ref 12.0–15.0)
MCH: 33.4 pg (ref 26.0–34.0)
MCHC: 34.2 g/dL (ref 30.0–36.0)
MCV: 97.7 fL (ref 80.0–100.0)
Platelets: 299 K/uL (ref 150–400)
RBC: 3.41 MIL/uL — ABNORMAL LOW (ref 3.87–5.11)
RDW: 11.9 % (ref 11.5–15.5)
WBC: 4.9 K/uL (ref 4.0–10.5)
nRBC: 0 % (ref 0.0–0.2)

## 2024-03-01 LAB — CULTURE, RESPIRATORY W GRAM STAIN

## 2024-03-01 LAB — LEGIONELLA PNEUMOPHILA SEROGP 1 UR AG: L. pneumophila Serogp 1 Ur Ag: NEGATIVE

## 2024-03-01 MED ORDER — ACETAMINOPHEN 500 MG PO TABS
1000.0000 mg | ORAL_TABLET | Freq: Three times a day (TID) | ORAL | Status: DC
  Administered 2024-03-01 – 2024-03-02 (×3): 1000 mg via ORAL
  Filled 2024-03-01 (×3): qty 2

## 2024-03-01 NOTE — Progress Notes (Signed)
 PROGRESS NOTE        PATIENT DETAILS Name: Jamie Patterson Age: 63 y.o. Sex: female Date of Birth: Nov 12, 1960 Admit Date: 02/26/2024 Admitting Physician Velna JONELLE Skeeter, MD ERE:Rojmx, Comer POUR, NP  Brief Summary: Patient is a 63 y.o.  female with history of hypothyroidism, HTN, HLD, essential tremor, chronic neuropathic pain, depression/anxiety-who presented with 3-4-day history of RUQ pain/worsening cough.  She was thought to have pneumonia and subsequently admitted to the hospitalist service.  Significant events: 7/18>> admit to [facility]  Significant studies: 7/18>> CT chest: Patchy opacity-right middle lobe. 7/18>> CT abdomen/pelvis: Layering gallstones-without inflammatory changes.  No acute findings.  Significant microbiology data: 7/19>> blood culture: No growth 7/19>> COVID/RSV/respiratory virus panel: Negative 7/19>> respiratory virus panel: Negative 7/19>> sputum culture:Staph Aureus-sensitivities pending.  Procedures: None  Consults: CCS  Subjective: Much better-RUQ pain is much better.  Appears much more awake/comfortable compared to the past several days  Objective: Vitals: Blood pressure 133/88, pulse 87, temperature 98.3 F (36.8 C), temperature source Oral, resp. rate 18, height 5' 5 (1.651 m), weight 95.3 kg, last menstrual period 06/01/2013, SpO2 91%.   Exam: Awake/alert. Abdomen: Soft-minimal RUQ tenderness on exam today. Non focal exam.   Pertinent Labs/Radiology:    Latest Ref Rng & Units 03/01/2024    5:26 AM 02/29/2024    4:30 AM 02/28/2024    6:57 AM  CBC  WBC 4.0 - 10.5 K/uL 4.9  5.2  12.1   Hemoglobin 12.0 - 15.0 g/dL 88.5  9.8  87.5   Hematocrit 36.0 - 46.0 % 33.3  30.3  37.4   Platelets 150 - 400 K/uL 299  210  290     Lab Results  Component Value Date   NA 135 03/01/2024   K 3.7 03/01/2024   CL 98 03/01/2024   CO2 26 03/01/2024     Assessment/Plan: Acute hypoxemic respiratory failure secondary to right  lobar pneumonia Improved clinically, on room air Leukocytosis has resolved.  Culture positive for Staph aureus-sensitivity pending Completed Zithromax  x 3 days Remains on Rocephin /vancomycin  pending culture data.   RUQ pain Mostly pleuritic-secondary to PNA  Abdomen/RUQ ultrasound/HIDA scan-stable-without any signs of acute cholecystitis Has improved with IV antibiotics and supportive care Minimize narcotics as much as possible.  Known history of cholelithiasis See above Will need help follow-up with general surgery for elective cholecystectomy.  HLD Statin  HTN BP stable-will resume antihypertensive when examined days.    Hypothyroidism Continue Synthroid   Neuropathic iron/chronic diarrhea On Neurontin  at significant lower dose than home dose.    Anxiety/depression Somewhat worse due to pain/hospitalization Continue Lexapro /Cymbalta .  History of intention tremor Primidone   Class 1 Obesity: Estimated body mass index is 34.95 kg/m as calculated from the following:   Height as of this encounter: 5' 5 (1.651 m).   Weight as of this encounter: 95.3 kg.   Code status:   Code Status: Full Code   DVT Prophylaxis: enoxaparin  (LOVENOX ) injection 40 mg Start: 02/27/24 1400 SCDs Start: 02/27/24 1231   Family Communication: None at bedside   Disposition Plan: Status is: Inpatient Remains inpatient appropriate because: Severity of illness   Planned Discharge Destination:Home   Diet: Diet Order             Diet Heart Room service appropriate? Yes; Fluid consistency: Thin  Diet effective now  Antimicrobial agents: Anti-infectives (From admission, onward)    Start     Dose/Rate Route Frequency Ordered Stop   03/01/24 1000  vancomycin  (VANCOREADY) IVPB 1500 mg/300 mL        1,500 mg 150 mL/hr over 120 Minutes Intravenous Every 24 hours 02/29/24 0950     02/29/24 1045  vancomycin  (VANCOREADY) IVPB 2000 mg/400 mL        2,000 mg 200  mL/hr over 120 Minutes Intravenous  Once 02/29/24 0950 02/29/24 1321   02/28/24 1800  azithromycin  (ZITHROMAX ) tablet 500 mg        500 mg Oral  Once 02/28/24 0959 02/28/24 1807   02/28/24 1300  metroNIDAZOLE  (FLAGYL ) IVPB 500 mg        500 mg 100 mL/hr over 60 Minutes Intravenous Every 12 hours 02/28/24 1210     02/27/24 1800  azithromycin  (ZITHROMAX ) 500 mg in sodium chloride  0.9 % 250 mL IVPB  Status:  Discontinued        500 mg 250 mL/hr over 60 Minutes Intravenous Every 24 hours 02/27/24 1233 02/27/24 1339   02/27/24 1800  azithromycin  (ZITHROMAX ) 500 mg in sodium chloride  0.9 % 250 mL IVPB  Status:  Discontinued        500 mg 250 mL/hr over 60 Minutes Intravenous Every 24 hours 02/27/24 1339 02/28/24 0959   02/27/24 1330  cefTRIAXone  (ROCEPHIN ) 2 g in sodium chloride  0.9 % 100 mL IVPB        2 g 200 mL/hr over 30 Minutes Intravenous Every 24 hours 02/27/24 1233 03/03/24 1329   02/26/24 2345  cefTRIAXone  (ROCEPHIN ) 1 g in sodium chloride  0.9 % 100 mL IVPB        1 g 200 mL/hr over 30 Minutes Intravenous  Once 02/26/24 2337 02/27/24 0432   02/26/24 2345  azithromycin  (ZITHROMAX ) 500 mg in sodium chloride  0.9 % 250 mL IVPB        500 mg 250 mL/hr over 60 Minutes Intravenous  Once 02/26/24 2337 02/27/24 0432   02/26/24 0000  amoxicillin -clavulanate (AUGMENTIN ) 875-125 MG tablet        1 tablet Oral Every 12 hours 02/26/24 2353     02/26/24 0000  azithromycin  (ZITHROMAX ) 250 MG tablet        250 mg Oral Daily 02/26/24 2353          MEDICATIONS: Scheduled Meds:  acetaminophen   1,000 mg Oral Q8H   cyanocobalamin   1,000 mcg Oral Daily   DULoxetine   60 mg Oral BID   enoxaparin  (LOVENOX ) injection  40 mg Subcutaneous Q24H   escitalopram   10 mg Oral Daily   gabapentin   300 mg Oral 3 times per day   ketorolac   15 mg Intravenous Q6H   lamoTRIgine   150 mg Oral BID   levothyroxine   50 mcg Oral Q0600   lidocaine   1 patch Transdermal Q24H   polyethylene glycol  17 g Oral Daily    primidone   50 mg Oral BID   rosuvastatin   40 mg Oral Daily   tiZANidine   8 mg Oral QHS   Continuous Infusions:  cefTRIAXone  (ROCEPHIN )  IV 2 g (02/28/24 1254)   metronidazole  500 mg (03/01/24 0037)   promethazine  (PHENERGAN ) injection (IM or IVPB) 12.5 mg (02/28/24 2124)   vancomycin  1,500 mg (03/01/24 0938)   PRN Meds:.albuterol , ALPRAZolam , bisacodyl , diphenhydrAMINE , HYDROmorphone  (DILAUDID ) injection, naLOXone  (NARCAN )  injection, ondansetron  (ZOFRAN ) IV, oxyCODONE , promethazine  (PHENERGAN ) injection (IM or IVPB)   I have personally reviewed following labs and imaging studies  LABORATORY  DATA: CBC: Recent Labs  Lab 02/26/24 2203 02/27/24 1344 02/28/24 0657 02/29/24 0430 03/01/24 0526  WBC 13.1* 12.1* 12.1* 5.2 4.9  NEUTROABS 9.8*  --   --   --   --   HGB 12.8 12.2 12.4 9.8* 11.4*  HCT 38.0 36.7 37.4 30.3* 33.3*  MCV 99.0 99.7 99.5 102.0* 97.7  PLT 227 229 290 210 299    Basic Metabolic Panel: Recent Labs  Lab 02/26/24 2203 02/27/24 1344 02/28/24 0635 02/29/24 0430 03/01/24 0526  NA 133*  --  132* 134* 135  K 4.9  --  4.3 4.1 3.7  CL 95*  --  99 99 98  CO2 24  --  21* 26 26  GLUCOSE 136*  --  156* 98 76  BUN 9  --  7* 6* 5*  CREATININE 0.74 0.72 0.56 0.69 0.77  CALCIUM  9.9  --  9.4 9.0 9.5    GFR: Estimated Creatinine Clearance: 83.2 mL/min (by C-G formula based on SCr of 0.77 mg/dL).  Liver Function Tests: Recent Labs  Lab 02/26/24 2203 02/28/24 1243 02/29/24 0430 03/01/24 0526  AST 23 24 21 31   ALT 20 17 16 21   ALKPHOS 136* 112 84 108  BILITOT 0.6 0.9 0.8 0.8  PROT 7.0 7.0 5.9* 6.5  ALBUMIN  4.5 3.1* 3.2* 3.2*   Recent Labs  Lab 02/26/24 2203 02/28/24 1243  LIPASE <10* 22   No results for input(s): AMMONIA in the last 168 hours.  Coagulation Profile: Recent Labs  Lab 02/28/24 1243  INR 1.2    Cardiac Enzymes: No results for input(s): CKTOTAL, CKMB, CKMBINDEX, TROPONINI in the last 168 hours.  BNP (last 3 results) No  results for input(s): PROBNP in the last 8760 hours.  Lipid Profile: No results for input(s): CHOL, HDL, LDLCALC, TRIG, CHOLHDL, LDLDIRECT in the last 72 hours.  Thyroid  Function Tests: Recent Labs    02/27/24 1347  TSH 7.879*    Anemia Panel: No results for input(s): VITAMINB12, FOLATE, FERRITIN, TIBC, IRON, RETICCTPCT in the last 72 hours.  Urine analysis:    Component Value Date/Time   COLORURINE YELLOW 08/19/2011 1500   APPEARANCEUR CLEAR 08/19/2011 1500   LABSPEC 1.008 08/19/2011 1500   PHURINE 8.0 08/19/2011 1500   GLUCOSEU NEGATIVE 08/19/2011 1500   HGBUR NEGATIVE 08/19/2011 1500   HGBUR large 07/23/2010 1505   BILIRUBINUR Unable to determine 08/19/2012 1039   KETONESUR NEGATIVE 08/19/2011 1500   PROTEINUR Unable to determine 08/19/2012 1039   PROTEINUR NEGATIVE 08/19/2011 1500   UROBILINOGEN  08/19/2012 1039     Comment:     Unable to determine due to Azo   UROBILINOGEN 0.2 08/19/2011 1500   NITRITE Unable to determine 08/19/2012 1039   NITRITE NEGATIVE 08/19/2011 1500   LEUKOCYTESUR moderate (2+) 08/19/2012 1039    Sepsis Labs: Lactic Acid, Venous    Component Value Date/Time   LATICACIDVEN 0.9 02/29/2024 0610    MICROBIOLOGY: Recent Results (from the past 240 hours)  Respiratory (~20 pathogens) panel by PCR     Status: None   Collection Time: 02/27/24  4:26 AM   Specimen: Nasopharyngeal Swab; Respiratory  Result Value Ref Range Status   Adenovirus NOT DETECTED NOT DETECTED Final   Coronavirus 229E NOT DETECTED NOT DETECTED Final    Comment: (NOTE) The Coronavirus on the Respiratory Panel, DOES NOT test for the novel  Coronavirus (2019 nCoV)    Coronavirus HKU1 NOT DETECTED NOT DETECTED Final   Coronavirus NL63 NOT DETECTED NOT DETECTED Final  Coronavirus OC43 NOT DETECTED NOT DETECTED Final   Metapneumovirus NOT DETECTED NOT DETECTED Final   Rhinovirus / Enterovirus NOT DETECTED NOT DETECTED Final   Influenza A NOT  DETECTED NOT DETECTED Final   Influenza B NOT DETECTED NOT DETECTED Final   Parainfluenza Virus 1 NOT DETECTED NOT DETECTED Final   Parainfluenza Virus 2 NOT DETECTED NOT DETECTED Final   Parainfluenza Virus 3 NOT DETECTED NOT DETECTED Final   Parainfluenza Virus 4 NOT DETECTED NOT DETECTED Final   Respiratory Syncytial Virus NOT DETECTED NOT DETECTED Final   Bordetella pertussis NOT DETECTED NOT DETECTED Final   Bordetella Parapertussis NOT DETECTED NOT DETECTED Final   Chlamydophila pneumoniae NOT DETECTED NOT DETECTED Final   Mycoplasma pneumoniae NOT DETECTED NOT DETECTED Final    Comment: Performed at [facility], [address], [place], [place] [address]  Resp panel by RT-PCR (RSV, Flu A&B, Covid) Anterior Nasal Swab     Status: None   Collection Time: 02/27/24 12:34 PM   Specimen: Anterior Nasal Swab  Result Value Ref Range Status   SARS Coronavirus 2 by RT PCR NEGATIVE NEGATIVE Final   Influenza A by PCR NEGATIVE NEGATIVE Final   Influenza B by PCR NEGATIVE NEGATIVE Final    Comment: (NOTE) The Xpert Xpress SARS-CoV-2/FLU/RSV plus assay is intended as an aid in the diagnosis of influenza from Nasopharyngeal swab specimens and should not be used as a sole basis for treatment. Nasal washings and aspirates are unacceptable for Xpert Xpress SARS-CoV-2/FLU/RSV testing.  Fact Sheet for Patients: BloggerCourse.com  Fact Sheet for Healthcare Providers: SeriousBroker.it  This test is not yet approved or cleared by the [place]  FDA and has been authorized for detection and/or diagnosis of SARS-CoV-2 by FDA under an Emergency Use Authorization (EUA). This EUA will remain in effect (meaning this test can be used) for the duration of the COVID-19 declaration under Section 564(b)(1) of the Act, 21 U.S.C. section 360bbb-3(b)(1), unless the authorization is terminated or revoked.     Resp Syncytial Virus by PCR NEGATIVE  NEGATIVE Final    Comment: (NOTE) Fact Sheet for Patients: BloggerCourse.com  Fact Sheet for Healthcare Providers: SeriousBroker.it  This test is not yet approved or cleared by the [place]  FDA and has been authorized for detection and/or diagnosis of SARS-CoV-2 by FDA under an Emergency Use Authorization (EUA). This EUA will remain in effect (meaning this test can be used) for the duration of the COVID-19 declaration under Section 564(b)(1) of the Act, 21 U.S.C. section 360bbb-3(b)(1), unless the authorization is terminated or revoked.  Performed at [facility], [address] [place], [place] [address]   Expectorated Sputum Assessment w Gram Stain, Rflx to Resp Cult     Status: None   Collection Time: 02/27/24  1:34 PM   Specimen: Expectorated Sputum  Result Value Ref Range Status   Specimen Description EXPECTORATED SPUTUM  Final   Special Requests NONE  Final   Sputum evaluation   Final    THIS SPECIMEN IS ACCEPTABLE FOR SPUTUM CULTURE Performed at [facility], [address], [place], [place] [address]    Report Status 02/27/2024 FINAL  Final  Culture, Respiratory w Gram Stain     Status: None (Preliminary result)   Collection Time: 02/27/24  1:34 PM  Result Value Ref Range Status   Specimen Description EXPECTORATED SPUTUM  Final   Special Requests NONE Reflexed from [id number]  Final   Gram Stain   Final  FEW WBC PRESENT, PREDOMINANTLY PMN FEW GRAM POSITIVE COCCI    Culture   Final    ABUNDANT STAPHYLOCOCCUS AUREUS SUSCEPTIBILITIES TO FOLLOW Performed at [facility], [address] [place], [place] [address]    Report Status PENDING  Incomplete  Culture, blood (Routine X 2) w Reflex to ID Panel     Status: None (Preliminary result)   Collection Time: 02/27/24  1:44 PM   Specimen: BLOOD RIGHT HAND  Result Value Ref Range Status   Specimen Description BLOOD RIGHT HAND  Final   Special  Requests   Final    BOTTLES DRAWN AEROBIC AND ANAEROBIC Blood Culture results may not be optimal due to an inadequate volume of blood received in culture bottles   Culture   Final    NO GROWTH 3 DAYS Performed at [facility], [address], [place], [place] [address]    Report Status PENDING  Incomplete  Culture, blood (Routine X 2) w Reflex to ID Panel     Status: None (Preliminary result)   Collection Time: 02/27/24  1:47 PM   Specimen: BLOOD  Result Value Ref Range Status   Specimen Description BLOOD RIGHT ANTECUBITAL  Final   Special Requests   Final    BOTTLES DRAWN AEROBIC AND ANAEROBIC Blood Culture adequate volume   Culture   Final    NO GROWTH 3 DAYS Performed at [facility], [address] [place], [place] [address]    Report Status PENDING  Incomplete    RADIOLOGY STUDIES/RESULTS: NM Hepato W/EF Result Date: 02/29/2024 CLINICAL DATA:  RIGHT upper quadrant pain.  Cholelithiasis. EXAM: NUCLEAR MEDICINE HEPATOBILIARY IMAGING WITH GALLBLADDER EF TECHNIQUE: Sequential images of the abdomen were obtained out to 60 minutes following intravenous administration of radiopharmaceutical. After oral ingestion of Ensure, gallbladder ejection fraction was determined. At 60 min, normal ejection fraction is greater than 33%. RADIOPHARMACEUTICALS:  5.5 mCi Tc-31m  Choletec  IV COMPARISON:  Ultrasound 02/28/2024 FINDINGS: Prompt clearance radiotracer from blood pool and homogeneous uptake in liver. Counts are evident in the common bile duct and small bowel by 15 minutes. Gallbladder fails to fill at 60 minutes. IV morphine  was administered to augment filling of the gallbladder. Gallbladder fills post morphine  augmentation. Ejection fraction cannot be calculated as gallbladder did not fill spontaneously by 60 minutes. IMPRESSION: 1. Patent cystic duct and common bile duct. 2. No evidence of cholecystitis. Electronically Signed   By: Jackquline Boxer M.D.   On: 02/29/2024 16:27      LOS: 3 days   Donalda Applebaum, MD  [facility]    To contact the attending provider between 7A-7P or the covering provider during after hours 7P-7A, please log into the web site [email or web address] and access using universal [place] password for that web site. If you do not have the password, please call the hospital operator.  03/01/2024, 10:30 AM

## 2024-03-01 NOTE — Plan of Care (Signed)
  Problem: Education: Goal: Knowledge of General Education information will improve Description: Including pain rating scale, medication(s)/side effects and non-pharmacologic comfort measures Outcome: Progressing   Problem: Health Behavior/Discharge Planning: Goal: Ability to manage health-related needs will improve Outcome: Progressing   Problem: Clinical Measurements: Goal: Ability to maintain clinical measurements within normal limits will improve Outcome: Progressing Goal: Will remain free from infection Outcome: Progressing Goal: Diagnostic test results will improve Outcome: Progressing Goal: Respiratory complications will improve Outcome: Progressing Goal: Cardiovascular complication will be avoided Outcome: Progressing   Problem: Activity: Goal: Risk for activity intolerance will decrease Outcome: Progressing   Problem: Respiratory: Goal: Ability to maintain adequate ventilation will improve Outcome: Progressing Goal: Ability to maintain a clear airway will improve Outcome: Progressing   Problem: Respiratory: Goal: Ability to maintain a clear airway will improve Outcome: Progressing

## 2024-03-01 NOTE — Progress Notes (Signed)
 Physical Therapy Evaluation Patient Details Name: Jamie Patterson MRN: [id number] DOB: 1960-09-09 Today's Date: 03/01/2024  History of Present Illness  Pt is a 63 y/o female admitted secondary to RUQ abdominal pain with poor P.O. intake. Pt found to have acute hypoxic respiratory failure secondary to right lobar pneumonia. PMH including but not limited to hypothyroidism, chronic cough, hypertension, hyperlipidemia, GERD, depression, benign essential tremor.  Clinical Impression  Pt presented supine in bed with HOB elevated, awake and willing to participate in therapy session. Prior to admission, pt reported that she ambulated with use of a cane and was independent with ADLs. Pt lives with her husband in a two level home with a ramped entrance. Pt stated that she does not go upstairs ever. At the time of evaluation, pt able to complete bed mobility and transfers at a modified independent level. She also ambulated within her room without use of an AD or need for physical assistance; however, she frequently reached for support surfaces with 1UE. PT suggested OPPT to work on pt's overall balance, strength and endurance; however, pt declining f/u PT services at this time. Pt would continue to benefit from skilled physical therapy services at this time while admitted and after d/c to address the below listed limitations in order to improve overall safety and independence with functional mobility.         If plan is discharge home, recommend the following: A little help with walking and/or transfers;Help with stairs or ramp for entrance;Assist for transportation   Can travel by private vehicle        Equipment Recommendations None recommended by PT  Recommendations for Other Services       Functional Status Assessment Patient has had a recent decline in their functional status and demonstrates the ability to make significant improvements in function in a reasonable and predictable amount of time.      Precautions / Restrictions Precautions Precautions: Fall Recall of Precautions/Restrictions: Intact Restrictions Weight Bearing Restrictions Per Provider Order: No      Mobility  Bed Mobility Overal bed mobility: Modified Independent                  Transfers Overall transfer level: Modified independent Equipment used: None                    Ambulation/Gait Ambulation/Gait assistance: Contact guard assist Gait Distance (Feet): 20 Feet Assistive device: None Gait Pattern/deviations: Step-through pattern, Decreased stride length Gait velocity: decreased     General Gait Details: pt agreeable to ambulate in her room. pt stating she just ambulated to the bathroom and back to bed by herself. pt able to navigate around her room with a slow, cautious gait with mild instability, frequently reaching for stable surfaces for support.  Stairs            Wheelchair Mobility     Tilt Bed    Modified Rankin (Stroke Patients Only)       Balance Overall balance assessment: Needs assistance Sitting-balance support: Feet supported Sitting balance-Leahy Scale: Good     Standing balance support: During functional activity, No upper extremity supported Standing balance-Leahy Scale: Fair                               Pertinent Vitals/Pain Pain Assessment Pain Assessment: Faces Faces Pain Scale: Hurts little more Pain Location: RUQ abdominal pain Pain Descriptors / Indicators: Guarding, Sore Pain Intervention(s): Monitored  during session    Home Living Family/patient expects to be discharged to:: Private residence Living Arrangements: Spouse/significant other Available Help at Discharge: Family;Available 24 hours/day Type of Home: House Home Access: Ramped entrance       Home Layout: Able to live on main level with bedroom/bathroom Home Equipment: Rolling Walker (2 wheels);Cane - single point      Prior Function Prior Level of  Function : Independent/Modified Independent             Mobility Comments: ambulates with use of a cane; frequent falls due to hx of R ankle fusion; refusing RW at this time       Extremity/Trunk Assessment   Upper Extremity Assessment Upper Extremity Assessment: Overall WFL for tasks assessed    Lower Extremity Assessment Lower Extremity Assessment: Overall WFL for tasks assessed;RLE deficits/detail RLE Deficits / Details: hx of R ankle fusion    Cervical / Trunk Assessment Cervical / Trunk Assessment: Normal  Communication   Communication Communication: No apparent difficulties    Cognition Arousal: Alert Behavior During Therapy: WFL for tasks assessed/performed   PT - Cognitive impairments: No apparent impairments                         Following commands: Intact       Cueing Cueing Techniques: Verbal cues     General Comments      Exercises     Assessment/Plan    PT Assessment Patient needs continued PT services  PT Problem List Decreased range of motion;Decreased activity tolerance;Decreased balance;Decreased strength;Decreased mobility;Decreased coordination;Decreased knowledge of use of DME;Decreased safety awareness;Decreased knowledge of precautions;Pain       PT Treatment Interventions DME instruction;Gait training;Stair training;Functional mobility training;Therapeutic activities;Therapeutic exercise;Balance training;Neuromuscular re-education;Patient/family education    PT Goals (Current goals can be found in the Care Plan section)  Acute Rehab PT Goals Patient Stated Goal: decreased pain PT Goal Formulation: With patient Time For Goal Achievement: 03/15/24 Potential to Achieve Goals: Good    Frequency Min 2X/week     Co-evaluation               AM-PAC PT 6 Clicks Mobility  Outcome Measure Help needed turning from your back to your side while in a flat bed without using bedrails?: None Help needed moving from lying  on your back to sitting on the side of a flat bed without using bedrails?: None Help needed moving to and from a bed to a chair (including a wheelchair)?: None Help needed standing up from a chair using your arms (e.g., wheelchair or bedside chair)?: None Help needed to walk in hospital room?: A Little Help needed climbing 3-5 steps with a railing? : A Lot 6 Click Score: 21    End of Session   Activity Tolerance: Patient tolerated treatment well Patient left: in bed;with call bell/phone within reach;Other (comment) (NT present to assess vitals) Nurse Communication: Mobility status PT Visit Diagnosis: Other abnormalities of gait and mobility (R26.89)    Time: 9194-9179 PT Time Calculation (min) (ACUTE ONLY): 15 min   Charges:   PT Evaluation $PT Eval Low Complexity: 1 Low   PT General Charges $$ ACUTE PT VISIT: 1 Visit         Delon DELENA KLEIN, DPT  Acute Rehabilitation Services Office [phone]   Delon HERO Royalty Domagala 03/01/2024, 8:54 AM

## 2024-03-02 ENCOUNTER — Other Ambulatory Visit (HOSPITAL_COMMUNITY): Payer: Self-pay

## 2024-03-02 ENCOUNTER — Inpatient Hospital Stay (HOSPITAL_COMMUNITY)

## 2024-03-02 DIAGNOSIS — K8020 Calculus of gallbladder without cholecystitis without obstruction: Secondary | ICD-10-CM | POA: Diagnosis not present

## 2024-03-02 DIAGNOSIS — J189 Pneumonia, unspecified organism: Secondary | ICD-10-CM | POA: Diagnosis not present

## 2024-03-02 DIAGNOSIS — J9601 Acute respiratory failure with hypoxia: Secondary | ICD-10-CM | POA: Diagnosis not present

## 2024-03-02 MED ORDER — DOXYCYCLINE HYCLATE 100 MG PO TABS
100.0000 mg | ORAL_TABLET | Freq: Two times a day (BID) | ORAL | 0 refills | Status: AC
  Filled 2024-03-02: qty 14, 7d supply, fill #0

## 2024-03-02 MED ORDER — CARVEDILOL 25 MG PO TABS
25.0000 mg | ORAL_TABLET | Freq: Two times a day (BID) | ORAL | Status: DC
  Administered 2024-03-02: 25 mg via ORAL
  Filled 2024-03-02: qty 1

## 2024-03-02 MED ORDER — SODIUM CHLORIDE 0.9% FLUSH: 10.0000 mL | INTRAVENOUS | Status: DC | PRN

## 2024-03-02 MED ORDER — IRBESARTAN 300 MG PO TABS
150.0000 mg | ORAL_TABLET | Freq: Every day | ORAL | Status: DC
  Administered 2024-03-02: 150 mg via ORAL
  Filled 2024-03-02: qty 1

## 2024-03-02 NOTE — Discharge Summary (Signed)
 PATIENT DETAILS Name: Jamie Patterson Age: 63 y.o. Sex: female Date of Birth: [birth year] MRN: [id number]. Admitting Physician: Jamie JONELLE Skeeter, MD ERE:Rojmx, Jamie POUR, NP  Admit Date: 02/26/2024 Discharge date: 03/02/2024  Recommendations for Outpatient Follow-up:  Follow up with PCP in 1-2 weeks Please obtain CMP/CBC in one week Repeat two-view chest x-ray in 4 to 6 weeks.  Admitted From:  Home  Disposition: Home   Discharge Condition: good  CODE STATUS:   Code Status: Full Code   Diet recommendation:  Diet Order             Diet - low sodium heart healthy           Diet Heart Room service appropriate? Yes; Fluid consistency: Thin  Diet effective now                   Brief Summary: Patient is a 63 y.o.  female with history of hypothyroidism, HTN, HLD, essential tremor, chronic neuropathic pain, depression/anxiety-who presented with 3-4-day history of RUQ pain/worsening cough.  She was thought to have pneumonia and subsequently admitted to the hospitalist service.   Significant events: 7/18>> admit to [facility]   Significant studies: 7/18>> CT chest: Patchy opacity-right middle lobe. 7/18>> CT abdomen/pelvis: Layering gallstones-without inflammatory changes.  No acute findings.   Significant microbiology data: 7/19>> blood culture: No growth 7/19>> COVID/RSV/respiratory virus panel: Negative 7/19>> respiratory virus panel: Negative 7/19>> sputum culture:MRSA   Procedures: None   Consults: [facility]  Brief Hospital Course: Acute hypoxemic respiratory failure secondary to right lobar pneumonia Clinically improved-afebrile-leukocytosis has resolved Sputum cultures positive for MRSA Initially on Rocephin /Zithromax -then vancomycin  added based on cultures on 7/21, discussed with ID pharmacist-Jamie Patterson-chart reviewed-as patient is on Lexapro -avoid Zyvox-as patient on already ARB-safe to avoid Bactrim-recommends doxycycline  on discharge. Needs repeat two-view CXR  or CT chest in 4 to 6 weeks to document resolution of pneumonia Per patient-she has had a MRSA sinus infection sometime in 2024, and also a MRSA pneumonia/H. influenzae pneumonia in 2023 (BAL cultures positive-hospitalized for hemoptysis).  Unclear why-she has these recurrent infections-autoimmune panel was negative for Wegener's in 2023. Continue outpatient follow-up with pulmonology-sees Jamie Patterson.  Reviewed outpatient notes-plans were for HRCT-which probably needs to be done after she has recovered from this acute illness. If further workup is negative-she may need outpatient follow-up with immunology.   RUQ pain Mostly pleuritic-secondary to PNA  Initially very severe-given history of cholelithiasis-biliary colic/cholecystitis was of concern, however RUQ ultrasound/CT abdomen did not show any signs of gallbladder inflammation-no wall thickening.  General surgery was consulted-Flagyl  was added-she underwent a HIDA scan which was negative. With ongoing treatment of pneumonia-her pleuritic chest pain has essentially resolved-only minimal at this point.  Marked improvement from how she first presented.  Known history of cholelithiasis See above Will need follow-up with general surgery for elective cholecystectomy.   HLD Statin   HTN BP stable-creeping up-resume antihypertensives on discharge.   Hypothyroidism Continue Synthroid    Neuropathic iron/chronic diarrhea Resume prior dosage of Neurontin  on discharge.   Anxiety/depression Somewhat worse due to pain/hospitalization Continue Lexapro /Cymbalta .   History of intention tremor Primidone    Class 1 Obesity: Estimated body mass index is 34.95 kg/m as calculated from the following:   Height as of this encounter: 5' 5 (1.651 m).   Weight as of this encounter: 95.3 kg.    Discharge Diagnoses:  Principal Problem:   Acute hypoxic respiratory failure (HCC) Active Problems:   Acute hypoxemic respiratory failure  (HCC)  Discharge Instructions:  Activity:  As tolerated   Discharge Instructions     Call MD for:  difficulty breathing, headache or visual disturbances   Complete by: As directed    Call MD for:  severe uncontrolled pain   Complete by: As directed    Diet - low sodium heart healthy   Complete by: As directed    Discharge instructions   Complete by: As directed    Follow with Primary MD  Jamie Jamie POUR, NP in 1-2 weeks  Please ask your primary care practitioner to repeat a two-view chest x-ray or a CT chest in 4 to 6 weeks to document resolution of pneumonia  Please get a complete blood count and chemistry panel checked by your Primary MD at your next visit, and again as instructed by your Primary MD.  Get Medicines reviewed and adjusted: Please take all your medications with you for your next visit with your Primary MD  Laboratory/radiological data: Please request your Primary MD to go over all hospital tests and procedure/radiological results at the follow up, please ask your Primary MD to get all Hospital records sent to his/her office.  In some cases, they will be blood work, cultures and biopsy results pending at the time of your discharge. Please request that your primary care M.D. follows up on these results.  Also Note the following: If you experience worsening of your admission symptoms, develop shortness of breath, life threatening emergency, suicidal or homicidal thoughts you must seek medical attention immediately by calling 911 or calling your MD immediately  if symptoms less severe.  You must read complete instructions/literature along with all the possible adverse reactions/side effects for all the Medicines you take and that have been prescribed to you. Take any new Medicines after you have completely understood and accpet all the possible adverse reactions/side effects.   Do not drive when taking Pain medications or sleeping medications  (Benzodaizepines)  Do not take more than prescribed Pain, Sleep and Anxiety Medications. It is not advisable to combine anxiety,sleep and pain medications without talking with your primary care practitioner  Special Instructions: If you have smoked or chewed Tobacco  in the last 2 yrs please stop smoking, stop any regular Alcohol  and or any Recreational drug use.  Wear Seat belts while driving.  Please note: You were cared for by a hospitalist during your hospital stay. Once you are discharged, your primary care physician will handle any further medical issues. Please note that NO REFILLS for any discharge medications will be authorized once you are discharged, as it is imperative that you return to your primary care physician (or establish a relationship with a primary care physician if you do not have one) for your post hospital discharge needs so that they can reassess your need for medications and monitor your lab values.   Increase activity slowly   Complete by: As directed       Allergies as of 03/02/2024       Reactions   Amlodipine  Swelling   Ankle edema        Medication List     TAKE these medications    acetaminophen  325 MG tablet Commonly known as: TYLENOL  Take 2 tablets (650 mg total) by mouth every 6 (six) hours as needed for mild pain (or Fever >/= 101).   albuterol  108 (90 Base) MCG/ACT inhaler Commonly known as: VENTOLIN  HFA INHALE TWO PUFFS INTO THE LUNGS EVERY SIX HOURS AS NEEDED FOR WHEEZING OR SHORTNESS OF  BREATH.   carvedilol  25 MG tablet Commonly known as: COREG  Take 1 tablet (25 mg total) by mouth 2 (two) times daily with a meal.   cholecalciferol  25 MCG (1000 UNIT) tablet Commonly known as: VITAMIN D3 Take 1,000 Units by mouth daily.   cyanocobalamin  1000 MCG tablet Commonly known as: VITAMIN B12 Take 1,000 mcg by mouth daily.   cycloSPORINE  0.05 % ophthalmic emulsion Commonly known as: RESTASIS  Place 1 drop into both eyes 2 (two) times  daily.   doxycycline  100 MG tablet Commonly known as: VIBRA -TABS Take 1 tablet (100 mg total) by mouth 2 (two) times daily for 7 days.   DULoxetine  60 MG capsule Commonly known as: CYMBALTA  Take 60 mg by mouth 2 (two) times daily.   escitalopram  10 MG tablet Commonly known as: LEXAPRO  Take 10 mg by mouth daily.   ferrous sulfate  325 (65 FE) MG tablet Take 325 mg by mouth daily with breakfast.   gabapentin  300 MG capsule Commonly known as: NEURONTIN  Take 300-1,200 mg by mouth See admin instructions. Take 1 capsule four times a day, then take 4 capsules at bedtime   ibuprofen 200 MG tablet Commonly known as: ADVIL Take 200-800 mg by mouth every 6 (six) hours as needed for moderate pain (pain score 4-6).   lamoTRIgine  150 MG tablet Commonly known as: LAMICTAL  TAKE 1 TABLET BY MOUTH TWICE A DAY   levothyroxine  50 MCG tablet Commonly known as: SYNTHROID  TAKE ONE TABLET BY MOUTH ONCE A DAY. TAKE ON AN EMPTY STOMACH WITH A GLASS OF WATER ATLEAST 30-60 MIN BEFORE BREAKFAST   MAGNESIUM PO Take 400 mg by mouth 3 (three) times daily.   Melatonin 10 MG Tabs Take 2 tablets by mouth at bedtime.   multivitamin tablet Take 1 tablet by mouth daily.   olmesartan  40 MG tablet Commonly known as: BENICAR  Take 1 tablet (40 mg total) by mouth daily. for blood pressure.   primidone  50 MG tablet Commonly known as: MYSOLINE  Take 1 tablet (50 mg total) by mouth 2 (two) times daily.   rosuvastatin  40 MG tablet Commonly known as: CRESTOR  Take 1 tablet (40 mg total) by mouth daily.   tiZANidine  4 MG tablet Commonly known as: ZANAFLEX  Take 8 mg by mouth at bedtime.   vitamin C 1000 MG tablet Take 1,000 mg by mouth daily.        Follow-up Information     Paola Dreama SAILOR, MD. Schedule an appointment as soon as possible for a visit in 2 week(s).   Specialty: Surgery Contact information: [address] [place] [address] [facility] [place]  [address] [phone]         Jamie Jamie POUR, NP. Schedule an appointment as soon as possible for a visit in 1 week(s).   Specialty: Internal Medicine Contact information: [address] [facility] [place] [address] [phone]         Patterson Verdon RAMAN, MD. Schedule an appointment as soon as possible for a visit in 2 week(s).   Specialty: Pulmonary Disease Contact information: [address] [place] [address] [phone]                Allergies  Allergen Reactions   Amlodipine  Swelling    Ankle edema     Other Procedures/Studies: DG Chest Port 1 View Result Date: 03/02/2024 CLINICAL DATA:  Pneumonia.  Shortness of breath and chest pain. EXAM: PORTABLE CHEST 1 VIEW COMPARISON:  CT chest dated 02/26/2024. FINDINGS: The heart size and mediastinal  contours are within normal limits. Redemonstrated airspace opacity in the right mid to lower lung zone, compatible with pneumonia. Streaky left basilar linear opacities favored to reflect atelectasis/scarring. No pneumothorax. No acute osseous abnormality. IMPRESSION: 1. Redemonstrated airspace opacity in the right mid to lower lung zone, compatible with pneumonia. Follow-up chest radiographs are recommended in 4-6 weeks to monitor for resolution. 2. Streaky left basilar linear opacities favored to reflect atelectasis/scarring. Electronically Signed   By: Harrietta Sherry M.D.   On: 03/02/2024 10:59   NM Hepato W/EF Result Date: 02/29/2024 CLINICAL DATA:  RIGHT upper quadrant pain.  Cholelithiasis. EXAM: NUCLEAR MEDICINE HEPATOBILIARY IMAGING WITH GALLBLADDER EF TECHNIQUE: Sequential images of the abdomen were obtained out to 60 minutes following intravenous administration of radiopharmaceutical. After oral ingestion of Ensure, gallbladder ejection fraction was determined. At 60 min, normal ejection fraction is greater than 33%. RADIOPHARMACEUTICALS:  5.5 mCi Tc-31m  Choletec  IV COMPARISON:  Ultrasound 02/28/2024 FINDINGS:  Prompt clearance radiotracer from blood pool and homogeneous uptake in liver. Counts are evident in the common bile duct and small bowel by 15 minutes. Gallbladder fails to fill at 60 minutes. IV morphine  was administered to augment filling of the gallbladder. Gallbladder fills post morphine  augmentation. Ejection fraction cannot be calculated as gallbladder did not fill spontaneously by 60 minutes. IMPRESSION: 1. Patent cystic duct and common bile duct. 2. No evidence of cholecystitis. Electronically Signed   By: Jackquline Boxer M.D.   On: 02/29/2024 16:27   US  Abdomen Limited RUQ (LIVER/GB) Result Date: 02/28/2024 CLINICAL DATA:  Right upper quadrant pain EXAM: ULTRASOUND ABDOMEN LIMITED RIGHT UPPER QUADRANT COMPARISON:  CT 02/26/2024. FINDINGS: Gallbladder: Distended gallbladder. No wall thickening or adjacent fluid. Dependent stones. The sonographer does report pain when scanning the gallbladder. Common bile duct: Diameter: 3 mm Liver: No focal lesion identified. Within normal limits in parenchymal echogenicity. Portal vein is patent on color Doppler imaging with normal direction of blood flow towards the liver. Other: None. IMPRESSION: Distended gallbladder with stones. The sonographer does report pain when scanning the gallbladder, Murphy's sign. No wall thickening or adjacent fluid or ductal dilatation. If there is further clinical concern of acute cholecystitis confirmatory HIDA scan could be considered. Electronically Signed   By: Ranell Bring M.D.   On: 02/28/2024 10:23   CT Chest Wo Contrast Result Date: 02/26/2024 EXAM: CT CHEST WITHOUT CONTRAST 02/26/2024 11:18:18 PM TECHNIQUE: CT of the chest was performed without the administration of intravenous contrast. Multiplanar reformatted images are provided for review. Automated exposure control, iterative reconstruction, and/or weight based adjustment of the mA/kV was utilized to reduce the radiation dose to as low as reasonably achievable.  COMPARISON: Cardiac CT chest dated 10/16/2023. CLINICAL HISTORY: ILD. FINDINGS: MEDIASTINUM: Heart and pericardium are unremarkable. The central airways are clear. LYMPH NODES: No mediastinal, hilar or axillary lymphadenopathy. LUNGS AND PLEURA: Trace right pleural effusion. Linear/patchy opacities in the posterior right upper lobe and bilateral lower lobes, favoring scarring. Additional focal/patchy opacity in the posterior right middle lobe favoring pneumonia with areas of central low density/necrosis (image 77), new from prior. Mild centrilobular and paracentral emphysematous changes, upper lung predominant. Biapical pleural parenchymal scarring. SOFT TISSUES/BONES: No acute abnormality of the bones or soft tissues. UPPER ABDOMEN: Evaluated on dedicated CT abdomen/pelvis. IMPRESSION: 1. New focal/patchy opacity in the posterior right middle lobe, favoring pneumonia. Follow-up chest radiographs are suggested in 4-6 weeks to document resolution. 2. Trace right pleural effusion. Electronically signed by: Pinkie Pebbles MD 02/26/2024 11:33 PM EDT RP Workstation: [id number]  CT ABDOMEN PELVIS W CONTRAST Result Date: 02/26/2024 EXAM: CT ABDOMEN AND PELVIS WITH CONTRAST 02/26/2024 11:17:41 PM TECHNIQUE: CT of the abdomen and pelvis was performed with the administration of intravenous contrast. Multiplanar reformatted images are provided for review. Automated exposure control, iterative reconstruction, and/or weight based adjustment of the mA/kV was utilized to reduce the radiation dose to as low as reasonably achievable. COMPARISON: 07/08/2023 CLINICAL HISTORY: Abdominal pain, acute, nonlocalized. FINDINGS: LOWER CHEST: Trace right pleural effusion. Dedicated CT chest reported separately. LIVER: Subcentimeter probable central right hepatic cyst (image 29). GALLBLADDER AND BILE DUCTS: Layering small gallstones (image 30), without associated inflammatory changes. SPLEEN: No acute abnormality. PANCREAS: No acute  abnormality. ADRENAL GLANDS: No acute abnormality. 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 No follow up is recommended. No stones in the kidneys or ureters. No hydronephrosis. No perinephric or periureteral stranding. Urinary bladder is unremarkable. GI AND BOWEL: Normal appendix (image 57). Stomach demonstrates no acute abnormality. There is no bowel obstruction. No bowel wall thickening. PERITONEUM AND RETROPERITONEUM: No ascites. No free air. VASCULATURE: Atherosclerotic calcifications of the abdominal aorta and branch vessels, although patent. LYMPH NODES: No lymphadenopathy. REPRODUCTIVE ORGANS: No acute abnormality. BONES AND SOFT TISSUES: Mild degenerative changes of the visualized thoracolumbar spine. No focal soft tissue abnormality. IMPRESSION: 1. No acute findings in the abdomen or pelvis. 2. Normal appendix. 3. Trace right pleural effusion. Dedicated CT chest reported separately. Electronically signed by: Pinkie Pebbles MD 02/26/2024 11:24 PM EDT RP Workstation: [id number]     TODAY-DAY OF DISCHARGE:  Subjective:   Rayneisha Sime today has no headache,no chest abdominal pain,no new weakness tingling or numbness, feels much better wants to go home today.   Objective:   Blood pressure (!) 165/89, pulse 89, temperature (!) 97.4 F (36.3 C), temperature source Oral, resp. rate 16, height 5' 5 (1.651 m), weight 95.3 kg, last menstrual period 06/01/2013, SpO2 94%.  Intake/Output Summary (Last 24 hours) at 03/02/2024 1158 Last data filed at 03/02/2024 0040 Gross per 24 hour  Intake 640 ml  Output --  Net 640 ml   Filed Weights   02/26/24 2147  Weight: 95.3 kg    Exam: Awake Alert, Oriented *3, No new F.N deficits, Normal affect [place].AT,PERRAL Supple Neck,No JVD, No cervical lymphadenopathy appriciated.  Symmetrical Chest wall movement, Good air movement bilaterally, CTAB RRR,No  Gallops,Rubs or new Murmurs, No Parasternal Heave +ve B.Sounds, Abd Soft, Non tender, No organomegaly appriciated, No rebound -guarding or rigidity. No Cyanosis, Clubbing or edema, No new Rash or bruise   PERTINENT RADIOLOGIC STUDIES: DG Chest Port 1 View Result Date: 03/02/2024 CLINICAL DATA:  Pneumonia.  Shortness of breath and chest pain. EXAM: PORTABLE CHEST 1 VIEW COMPARISON:  CT chest dated 02/26/2024. FINDINGS: The heart size and mediastinal contours are within normal limits. Redemonstrated airspace opacity in the right mid to lower lung zone, compatible with pneumonia. Streaky left basilar linear opacities favored to reflect atelectasis/scarring. No pneumothorax. No acute osseous abnormality. IMPRESSION: 1. Redemonstrated airspace opacity in the right mid to lower lung zone, compatible with pneumonia. Follow-up chest radiographs are recommended in 4-6 weeks to monitor for resolution. 2. Streaky left basilar linear opacities favored to reflect atelectasis/scarring. Electronically Signed   By: Harrietta Sherry M.D.   On: 03/02/2024 10:59   NM Hepato W/EF Result Date: 02/29/2024 CLINICAL DATA:  RIGHT upper quadrant pain.  Cholelithiasis. EXAM: NUCLEAR MEDICINE HEPATOBILIARY IMAGING WITH GALLBLADDER EF TECHNIQUE: Sequential images of  the abdomen were obtained out to 60 minutes following intravenous administration of radiopharmaceutical. After oral ingestion of Ensure, gallbladder ejection fraction was determined. At 60 min, normal ejection fraction is greater than 33%. RADIOPHARMACEUTICALS:  5.5 mCi Tc-65m  Choletec  IV COMPARISON:  Ultrasound 02/28/2024 FINDINGS: Prompt clearance radiotracer from blood pool and homogeneous uptake in liver. Counts are evident in the common bile duct and small bowel by 15 minutes. Gallbladder fails to fill at 60 minutes. IV morphine  was administered to augment filling of the gallbladder. Gallbladder fills post morphine  augmentation. Ejection fraction cannot be calculated  as gallbladder did not fill spontaneously by 60 minutes. IMPRESSION: 1. Patent cystic duct and common bile duct. 2. No evidence of cholecystitis. Electronically Signed   By: Jackquline Boxer M.D.   On: 02/29/2024 16:27     PERTINENT LAB RESULTS: CBC: Recent Labs    02/29/24 0430 03/01/24 0526  WBC 5.2 4.9  HGB 9.8* 11.4*  HCT 30.3* 33.3*  PLT 210 299   CMET CMP     Component Value Date/Time   NA 135 03/01/2024 0526   K 3.7 03/01/2024 0526   CL 98 03/01/2024 0526   CO2 26 03/01/2024 0526   GLUCOSE 76 03/01/2024 0526   BUN 5 (L) 03/01/2024 0526   CREATININE 0.77 03/01/2024 0526   CREATININE 0.67 02/16/2018 1332   CALCIUM  9.5 03/01/2024 0526   PROT 6.5 03/01/2024 0526   PROT 7.0 10/22/2023 0758   ALBUMIN  3.2 (L) 03/01/2024 0526   ALBUMIN  4.5 10/22/2023 0758   AST 31 03/01/2024 0526   ALT 21 03/01/2024 0526   ALKPHOS 108 03/01/2024 0526   BILITOT 0.8 03/01/2024 0526   BILITOT 0.2 10/22/2023 0758   GFR 88.78 05/06/2023 1431   GFRNONAA >60 03/01/2024 0526    GFR Estimated Creatinine Clearance: 83.2 mL/min (by C-G formula based on SCr of 0.77 mg/dL). Recent Labs    02/28/24 1243  LIPASE 22   No results for input(s): CKTOTAL, CKMB, CKMBINDEX, TROPONINI in the last 72 hours. Invalid input(s): POCBNP No results for input(s): DDIMER in the last 72 hours. No results for input(s): HGBA1C in the last 72 hours. No results for input(s): CHOL, HDL, LDLCALC, TRIG, CHOLHDL, LDLDIRECT in the last 72 hours. No results for input(s): TSH, T4TOTAL, T3FREE, THYROIDAB in the last 72 hours.  Invalid input(s): FREET3 No results for input(s): VITAMINB12, FOLATE, FERRITIN, TIBC, IRON, RETICCTPCT in the last 72 hours. Coags: Recent Labs    02/28/24 1243  INR 1.2   Microbiology: Recent Results (from the past 240 hours)  Respiratory (~20 pathogens) panel by PCR     Status: None   Collection Time: 02/27/24  4:26 AM   Specimen:  Nasopharyngeal Swab; Respiratory  Result Value Ref Range Status   Adenovirus NOT DETECTED NOT DETECTED Final   Coronavirus 229E NOT DETECTED NOT DETECTED Final    Comment: (NOTE) The Coronavirus on the Respiratory Panel, DOES NOT test for the novel  Coronavirus (2019 nCoV)    Coronavirus HKU1 NOT DETECTED NOT DETECTED Final   Coronavirus NL63 NOT DETECTED NOT DETECTED Final   Coronavirus OC43 NOT DETECTED NOT DETECTED Final   Metapneumovirus NOT DETECTED NOT DETECTED Final   Rhinovirus / Enterovirus NOT DETECTED NOT DETECTED Final   Influenza A NOT DETECTED NOT DETECTED Final   Influenza B NOT DETECTED NOT DETECTED Final   Parainfluenza Virus 1 NOT DETECTED NOT DETECTED Final   Parainfluenza Virus 2 NOT DETECTED NOT DETECTED Final   Parainfluenza Virus 3 NOT DETECTED NOT  DETECTED Final   Parainfluenza Virus 4 NOT DETECTED NOT DETECTED Final   Respiratory Syncytial Virus NOT DETECTED NOT DETECTED Final   Bordetella pertussis NOT DETECTED NOT DETECTED Final   Bordetella Parapertussis NOT DETECTED NOT DETECTED Final   Chlamydophila pneumoniae NOT DETECTED NOT DETECTED Final   Mycoplasma pneumoniae NOT DETECTED NOT DETECTED Final    Comment: Performed at [facility], [address], [place], [place] [address]  Resp panel by RT-PCR (RSV, Flu A&B, Covid) Anterior Nasal Swab     Status: None   Collection Time: 02/27/24 12:34 PM   Specimen: Anterior Nasal Swab  Result Value Ref Range Status   SARS Coronavirus 2 by RT PCR NEGATIVE NEGATIVE Final   Influenza A by PCR NEGATIVE NEGATIVE Final   Influenza B by PCR NEGATIVE NEGATIVE Final    Comment: (NOTE) The Xpert Xpress SARS-CoV-2/FLU/RSV plus assay is intended as an aid in the diagnosis of influenza from Nasopharyngeal swab specimens and should not be used as a sole basis for treatment. Nasal washings and aspirates are unacceptable for Xpert Xpress SARS-CoV-2/FLU/RSV testing.  Fact Sheet for  Patients: BloggerCourse.com  Fact Sheet for Healthcare Providers: SeriousBroker.it  This test is not yet approved or cleared by the [place]  FDA and has been authorized for detection and/or diagnosis of SARS-CoV-2 by FDA under an Emergency Use Authorization (EUA). This EUA will remain in effect (meaning this test can be used) for the duration of the COVID-19 declaration under Section 564(b)(1) of the Act, 21 U.S.C. section 360bbb-3(b)(1), unless the authorization is terminated or revoked.     Resp Syncytial Virus by PCR NEGATIVE NEGATIVE Final    Comment: (NOTE) Fact Sheet for Patients: BloggerCourse.com  Fact Sheet for Healthcare Providers: SeriousBroker.it  This test is not yet approved or cleared by the [place]  FDA and has been authorized for detection and/or diagnosis of SARS-CoV-2 by FDA under an Emergency Use Authorization (EUA). This EUA will remain in effect (meaning this test can be used) for the duration of the COVID-19 declaration under Section 564(b)(1) of the Act, 21 U.S.C. section 360bbb-3(b)(1), unless the authorization is terminated or revoked.  Performed at [facility], [address], [place], [place] [address]   Expectorated Sputum Assessment w Gram Stain, Rflx to Resp Cult     Status: None   Collection Time: 02/27/24  1:34 PM   Specimen: Expectorated Sputum  Result Value Ref Range Status   Specimen Description EXPECTORATED SPUTUM  Final   Special Requests NONE  Final   Sputum evaluation   Final    THIS SPECIMEN IS ACCEPTABLE FOR SPUTUM CULTURE Performed at [facility], [address], [place], [place] [address]    Report Status 02/27/2024 FINAL  Final  Culture, Respiratory w Gram Stain     Status: None   Collection Time: 02/27/24  1:34 PM  Result Value Ref Range Status   Specimen Description EXPECTORATED SPUTUM  Final   Special  Requests NONE Reflexed from [id number]  Final   Gram Stain   Final    FEW WBC PRESENT, PREDOMINANTLY PMN FEW GRAM POSITIVE COCCI Performed at [facility], [address], [place], [place] [address]    Culture   Final    ABUNDANT METHICILLIN RESISTANT STAPHYLOCOCCUS AUREUS   Report Status 03/01/2024 FINAL  Final   Organism ID, Bacteria METHICILLIN RESISTANT STAPHYLOCOCCUS AUREUS  Final      Susceptibility   Methicillin resistant staphylococcus aureus - MIC*  CIPROFLOXACIN  >=8 RESISTANT Resistant     ERYTHROMYCIN >=8 RESISTANT Resistant     GENTAMICIN <=0.5 SENSITIVE Sensitive     OXACILLIN >=4 RESISTANT Resistant     TETRACYCLINE <=1 SENSITIVE Sensitive     VANCOMYCIN  1 SENSITIVE Sensitive     TRIMETH/SULFA <=10 SENSITIVE Sensitive     CLINDAMYCIN >=8 RESISTANT Resistant     RIFAMPIN <=0.5 SENSITIVE Sensitive     Inducible Clindamycin NEGATIVE Sensitive     LINEZOLID 2 SENSITIVE Sensitive     * ABUNDANT METHICILLIN RESISTANT STAPHYLOCOCCUS AUREUS  Culture, blood (Routine X 2) w Reflex to ID Panel     Status: None (Preliminary result)   Collection Time: 02/27/24  1:44 PM   Specimen: BLOOD RIGHT HAND  Result Value Ref Range Status   Specimen Description BLOOD RIGHT HAND  Final   Special Requests   Final    BOTTLES DRAWN AEROBIC AND ANAEROBIC Blood Culture results may not be optimal due to an inadequate volume of blood received in culture bottles   Culture   Final    NO GROWTH 4 DAYS Performed at [facility], [address] [place], [place] [address]    Report Status PENDING  Incomplete  Culture, blood (Routine X 2) w Reflex to ID Panel     Status: None (Preliminary result)   Collection Time: 02/27/24  1:47 PM   Specimen: BLOOD  Result Value Ref Range Status   Specimen Description BLOOD RIGHT ANTECUBITAL  Final   Special Requests   Final    BOTTLES DRAWN AEROBIC AND ANAEROBIC Blood Culture adequate volume   Culture   Final    NO GROWTH 4 DAYS Performed at [facility], [address] [place], [place] [address]    Report Status PENDING  Incomplete    FURTHER DISCHARGE INSTRUCTIONS:  Get Medicines reviewed and adjusted: Please take all your medications with you for your next visit with your Primary MD  Laboratory/radiological data: Please request your Primary MD to go over all hospital tests and procedure/radiological results at the follow up, please ask your Primary MD to get all Hospital records sent to his/her office.  In some cases, they will be blood work, cultures and biopsy results pending at the time of your discharge. Please request that your primary care M.D. goes through all the records of your hospital data and follows up on these results.  Also Note the following: If you experience worsening of your admission symptoms, develop shortness of breath, life threatening emergency, suicidal or homicidal thoughts you must seek medical attention immediately by calling 911 or calling your MD immediately  if symptoms less severe.  You must read complete instructions/literature along with all the possible adverse reactions/side effects for all the Medicines you take and that have been prescribed to you. Take any new Medicines after you have completely understood and accpet all the possible adverse reactions/side effects.   Do not drive when taking Pain medications or sleeping medications (Benzodaizepines)  Do not take more than prescribed Pain, Sleep and Anxiety Medications. It is not advisable to combine anxiety,sleep and pain medications without talking with your primary care practitioner  Special Instructions: If you have smoked or chewed Tobacco  in the last 2 yrs please stop smoking, stop any regular Alcohol  and or any Recreational drug use.  Wear Seat belts while driving.  Please note: You were cared for by a hospitalist during your hospital stay. Once you are discharged, your primary care physician  will handle any further medical  issues. Please note that NO REFILLS for any discharge medications will be authorized once you are discharged, as it is imperative that you return to your primary care physician (or establish a relationship with a primary care physician if you do not have one) for your post hospital discharge needs so that they can reassess your need for medications and monitor your lab values.  Total Time spent coordinating discharge including counseling, education and face to face time equals greater than 30 minutes.  SignedBETHA Donalda Applebaum 03/02/2024 11:58 AM

## 2024-03-02 NOTE — Plan of Care (Signed)

## 2024-03-03 ENCOUNTER — Telehealth: Payer: Self-pay

## 2024-03-03 LAB — CULTURE, BLOOD (ROUTINE X 2)
Culture: NO GROWTH
Culture: NO GROWTH
Special Requests: ADEQUATE

## 2024-03-03 LAB — MRSA NEXT GEN BY PCR, NASAL: MRSA by PCR Next Gen: DETECTED — AB

## 2024-03-03 NOTE — Transitions of Care (Post Inpatient/ED Visit) (Signed)
 03/03/2024  Name: Jamie Patterson MRN: [id number] DOB: May 09, 1961  Today's TOC FU Call Status: Today's TOC FU Call Status:: Successful TOC FU Call Completed TOC FU Call Complete Date: 03/03/24 Patient's Name and Date of Birth confirmed.  Transition Care Management Follow-up Telephone Call Date of Discharge: 03/02/24 Discharge Facility: [facility]) Type of Discharge: Inpatient Admission How have you been since you were released from the hospital?: Better Any questions or concerns?: No  Items Reviewed: Did you receive and understand the discharge instructions provided?: Yes Medications obtained,verified, and reconciled?: Yes (Medications Reviewed) Any new allergies since your discharge?: No Dietary orders reviewed?: Yes Do you have support at home?: Yes People in Home [RPT]: spouse Name of Support/Comfort Primary Source: pneumonia  Medications Reviewed Today: Medications Reviewed Today     Reviewed by Emmitt Pan, LPN (Licensed Practical Nurse) on 03/03/24 at 1300  Med List Status: <None>   Medication Order Taking? Sig Documenting Provider Last Dose Status Informant  acetaminophen  (TYLENOL ) 325 MG tablet [id number] Yes Take 2 tablets (650 mg total) by mouth every 6 (six) hours as needed for mild pain (or Fever >/= 101). Sherrill Cable [place], DO  Active Self, Pharmacy Records  albuterol  (VENTOLIN  HFA) 108 [phone] Base) MCG/ACT inhaler [id number] Yes INHALE TWO PUFFS INTO THE LUNGS EVERY SIX HOURS AS NEEDED FOR WHEEZING OR SHORTNESS OF BREATH. Gretta Comer POUR, NP  Active Self, Pharmacy Records  Ascorbic Acid (VITAMIN C) 1000 MG tablet [id number] Yes Take 1,000 mg by mouth daily. [provider]  Active Self, Pharmacy Records  carvedilol  (COREG ) 25 MG tablet [id number] Yes Take 1 tablet (25 mg total) by mouth 2 (two) times daily with a meal. Delford Maude JAYSON, MD  Active Self, Pharmacy Records  cholecalciferol  (VITAMIN D3) 25 MCG (1000 UNIT) tablet [id number] Yes Take 1,000 Units  by mouth daily. [provider]  Active Self, Pharmacy Records  cycloSPORINE  (RESTASIS ) 0.05 % ophthalmic emulsion [id number] Yes Place 1 drop into both eyes 2 (two) times daily. [provider]  Active Self, Pharmacy Records  doxycycline  (VIBRA -TABS) 100 MG tablet [id number] Yes Take 1 tablet (100 mg total) by mouth 2 (two) times daily for 7 days. Raenelle Donalda HERO, MD  Active   DULoxetine  (CYMBALTA ) 60 MG capsule [id number] Yes Take 60 mg by mouth 2 (two) times daily.  [provider]  Active Self, Pharmacy Records  escitalopram  (LEXAPRO ) 10 MG tablet [id number] Yes Take 10 mg by mouth daily.  [provider]  Active Self, Pharmacy Records           Med Note [facility], [facility]   Thu Apr 07, 2019  9:31 AM)    ferrous sulfate  325 (65 FE) MG tablet [id number] Yes Take 325 mg by mouth daily with breakfast. [provider]  Active Self, Pharmacy Records  gabapentin  (NEURONTIN ) 300 MG capsule [id number] Yes Take 300-1,200 mg by mouth See admin instructions. Take 1 capsule four times a day, then take 4 capsules at bedtime [provider]  Active Self, Pharmacy Records           Med Note (WHITE, TONIA S   Tue Mar 04, 2022  3:30 AM)    ibuprofen (ADVIL) 200 MG tablet [id number] Yes Take 200-800 mg by mouth every 6 (six) hours as needed for moderate pain (pain score 4-6). [provider]  Active Self, Pharmacy Records  lamoTRIgine  (LAMICTAL ) 150 MG tablet [id number] Yes TAKE 1 TABLET BY MOUTH TWICE A DAY Tat, Asberry RAMAN, DO  Active Self,  Pharmacy Records  levothyroxine  (SYNTHROID ) 50 MCG tablet [id number] Yes TAKE ONE TABLET BY MOUTH ONCE A DAY. TAKE ON AN EMPTY STOMACH WITH A GLASS OF WATER ATLEAST 30-60 MIN BEFORE BREAKFAST Clark, Katherine K, NP  Active Self, Pharmacy Records  MAGNESIUM PO [id number] Yes Take 400 mg by mouth 3 (three) times daily.  [provider]  Active Self, Pharmacy Records  Melatonin 10 MG TABS [id number] Yes Take 2  tablets by mouth at bedtime.  [provider]  Active Self, Pharmacy Records  Multiple Vitamin (MULTIVITAMIN) tablet [id number] Yes Take 1 tablet by mouth daily. [provider]  Active Self, Pharmacy Records  olmesartan  (BENICAR ) 40 MG tablet [id number] Yes Take 1 tablet (40 mg total) by mouth daily. for blood pressure. Gretta Comer POUR, NP  Active Self, Pharmacy Records  primidone  (MYSOLINE ) 50 MG tablet [id number] Yes Take 1 tablet (50 mg total) by mouth 2 (two) times daily. Evonnie Asberry RAMAN, DO  Active Self, Pharmacy Records  rosuvastatin  (CRESTOR ) 40 MG tablet [id number] Yes Take 1 tablet (40 mg total) by mouth daily. Nishan, Peter C, MD  Active Self, Pharmacy Records  tiZANidine  (ZANAFLEX ) 4 MG tablet [id number] Yes Take 8 mg by mouth at bedtime. [provider]  Active Self, Pharmacy Records           Med Note [facility], DONETA RAMAN   Tue Mar 04, 2022  3:35 AM)    vitamin B-12 (CYANOCOBALAMIN ) 1000 MCG tablet [id number] Yes Take 1,000 mcg by mouth daily. [provider]  Active Self, Pharmacy Records            Home Care and Equipment/Supplies: Were Home Health Services Ordered?: NA Any new equipment or medical supplies ordered?: NA  Functional Questionnaire: Do you need assistance with bathing/showering or dressing?: No Do you need assistance with meal preparation?: No Do you need assistance with eating?: No Do you have difficulty maintaining continence: No Do you need assistance with getting out of bed/getting out of a chair/moving?: No Do you have difficulty managing or taking your medications?: No  Follow up appointments reviewed: PCP Follow-up appointment confirmed?: No (declined, will call back to schedule) MD Provider Line Number:[phone] Given: No Specialist Hospital Follow-up appointment confirmed?: Yes Date of Specialist follow-up appointment?: 03/17/24 Follow-Up Specialty Provider:: pulmo Do you need transportation to your follow-up  appointment?: No Do you understand care options if your condition(s) worsen?: Yes-patient verbalized understanding    SIGNATURE Julian Lemmings, LPN [facility] Nurse Health Advisor Direct Dial [phone]

## 2024-03-10 ENCOUNTER — Ambulatory Visit: Admitting: Primary Care

## 2024-03-10 VITALS — BP 120/64 | HR 84 | Temp 98.7°F | Ht 65.0 in | Wt 224.0 lb

## 2024-03-10 DIAGNOSIS — J189 Pneumonia, unspecified organism: Secondary | ICD-10-CM | POA: Insufficient documentation

## 2024-03-10 DIAGNOSIS — K8020 Calculus of gallbladder without cholecystitis without obstruction: Secondary | ICD-10-CM | POA: Diagnosis not present

## 2024-03-10 MED ORDER — ACETAMINOPHEN-CODEINE 300-30 MG PO TABS: 1.0000 | ORAL_TABLET | Freq: Three times a day (TID) | ORAL | 0 refills | Status: AC | PRN

## 2024-03-10 MED ORDER — TRAMADOL HCL 50 MG PO TABS: 50.0000 mg | ORAL_TABLET | Freq: Two times a day (BID) | ORAL | 0 refills | Status: DC | PRN

## 2024-03-10 NOTE — Assessment & Plan Note (Signed)
 With recent hospitalization.  Hospital notes, labs, imaging reviewed. Respiratory exam today reassuring.  She will follow-up with pulmonology next week as scheduled. We discussed the need for repeat CT chest 4 to 6 weeks after her initial diagnosis.  She will discuss this with her pulmonologist.

## 2024-03-10 NOTE — Patient Instructions (Signed)
 Follow-up with pulmonology as scheduled.  You may take the tramadol  twice daily as needed for severe pain.  Please notify me if your stomach symptoms persist.  It was a pleasure to see you today!

## 2024-03-10 NOTE — Progress Notes (Signed)
 Subjective:    Patient ID: Jamie Patterson, female    DOB: 1961-06-03, 63 y.o.   MRN: [id number]  HPI  Jamie Patterson is a very pleasant 63 y.o. female with a history of hypertension, acute respiratory failure, hypothyroidism, seizure disorder, hyperlipidemia, chronic cough, right adrenal mass who presents today for hospital follow-up.  She initially presented to [facility] on 02/26/24 for a 3 to 4-day history right upper quadrant abdominal pain and worsening cough.  She was admitted to [facility] for right middle lobe pneumonia and what was thought to be severe Coley lithiasis with cholecystitis.  During her hospital stay she was treated with IV antibiotics.  She did have a positive MRSA sputum culture.  She was noted to have cholelithiasis without cholecystitis, confirmed on CT abdomen pelvis and HIDA scan. She was discharged home on 03/02/24 with oral antibiotics.  She will need repeat CT chest in 4 to 6 weeks.  Since her hospital stay she's feeling better from a respiratory standpoint.  Her chronic cough is about the same.  Last night her RUQ abdominal pain returned and has been constant since. This began shortly before dinner time. She denies nausea, diarrhea, vomiting, fevers. Her RUQ abdominal pain is the cause for her ED visit, not her cough. She wasn't told that she could see a Careers adviser. She has an appointment scheduled with pulmonology next week. She is requesting some additional pain medication for her RUQ pain.      Review of Systems  Constitutional:  Negative for fever.  Respiratory:  Positive for cough.   Gastrointestinal:  Positive for abdominal pain. Negative for diarrhea, nausea and vomiting.         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   Trimalleolar fracture on right s/p fusion     Benign essential tremor    worsened by anxiety   Chronic pain due to injury    at R ankle due to ankle fracture   Depression     followed by psych   GERD (gastroesophageal reflux disease)    Hemoptysis 03/03/2022   History of hypertension    HLD (hyperlipidemia)    Imbalance    likely multifactorial (s/p balance training at [facility] 08/2010) ?CMT   Influenza A 09/15/2018   Iron deficiency anemia    LLL pneumonia 10/05/2018   Right ankle pain 2003   trimalleolar fx s/p fusion   Seizure (HCC)    focal, ex vacuo ventriculomegaly?, pending neuro w/u with sleep deprived EEG   Syncope and collapse 08/19/2011    Social History   Socioeconomic History   Marital status: Married    Spouse name: Not on file   Number of children: 1   Years of education: Not on file   Highest education level: Not on file  Occupational History   Occupation: Disabled    Comment: depression   Occupation: DISABILITY    Employer: UNEMPLOYED  Tobacco Use   Smoking status: Former    Current packs/day: 0.00    Average packs/day: 1 pack/day for 20.0 years (20.0 ttl pk-yrs)    Types: Cigarettes    Start date: 08/12/1975    Quit date: 08/12/1995    Years since quitting: 28.5   Smokeless tobacco: Never  Vaping Use   Vaping status: Never Used  Substance and Sexual Activity   Alcohol use: No    Alcohol/week: 0.0 standard drinks of alcohol    Comment: occassinal  Drug use: No   Sexual activity: Not on file  Other Topics Concern   Not on file  Social History Narrative   No caffeine. Divorced. 1 child. 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, although endorses that she falls a lot    Social Drivers of Health   Financial Resource Strain: Low Risk  (03/25/2023)   Overall Financial Resource Strain (CARDIA)    Difficulty of Paying Living Expenses: Not hard at all  Food Insecurity: No Food Insecurity (02/28/2024)   Hunger Vital Sign    Worried About Running Out of Food in the Last Year: Never true    Ran Out of Food in the Last Year: Never true  Transportation Needs: No  Transportation Needs (02/28/2024)   PRAPARE - Administrator, Civil Service (Medical): No    Lack of Transportation (Non-Medical): No  Physical Activity: Inactive (03/25/2023)   Exercise Vital Sign    Days of Exercise per Week: 0 days    Minutes of Exercise per Session: 0 min  Stress: No Stress Concern Present (03/25/2023)   [employer] of Occupational Health - Occupational Stress Questionnaire    Feeling of Stress : Not at all  Social Connections: Moderately Isolated (02/28/2024)   Social Connection and Isolation Panel    Frequency of Communication with Friends and Family: More than three times a week    Frequency of Social Gatherings with Friends and Family: More than three times a week    Attends Religious Services: Never    Database administrator or Organizations: No    Attends Banker Meetings: Never    Marital Status: Married  Catering manager Violence: Not At Risk (02/28/2024)   Humiliation, Afraid, Rape, and Kick questionnaire    Fear of Current or Ex-Partner: No    Emotionally Abused: No    Physically Abused: No    Sexually Abused: No    Past Surgical History:  Procedure Laterality Date   ANKLE FUSION  02/2004   Right-50% disability    BRONCHIAL WASHINGS  03/04/2022   Procedure: BRONCHIAL WASHINGS;  Surgeon: Geronimo Amel, MD;  Location: [facility];  Service: Endoscopy;;   CARPAL TUNNEL RELEASE  12/2017   L CTR repair, R thumb A1 pulley release (Gramig)   CESAREAN SECTION     HAMMER TOE SURGERY  09/12/2019   hospitalization  08/2011   syncope thought 2/2 seizure, MRI - Hyperintensity in the cerebral white matter bilaterally is nonspecific, started on Keppra    ORIF ANKLE FRACTURE  06/2003   Right   VIDEO BRONCHOSCOPY Bilateral 03/04/2022   Procedure: VIDEO BRONCHOSCOPY WITHOUT FLUORO;  Surgeon: Geronimo Amel, MD;  Location: [facility];  Service: Endoscopy;  Laterality: Bilateral;    Family History  Problem Relation Age of Onset    Depression Mother    Arrhythmia Mother 72       Dx with VTach after having sudden collapse, did NOT have h/o AMI's    Atrial fibrillation Father    Hypertension Father    Osteoarthritis Father    Aortic aneurysm Father    Hepatitis Brother        Hep. C   Anemia Sister     Allergies  Allergen Reactions   Amlodipine  Swelling    Ankle edema    Current Outpatient Medications on File Prior to Visit  Medication Sig Dispense Refill   albuterol  (VENTOLIN  HFA) 108 (90 Base) MCG/ACT inhaler INHALE TWO  PUFFS INTO THE LUNGS EVERY SIX HOURS AS NEEDED FOR WHEEZING OR SHORTNESS OF BREATH. 8.5 g 0   Ascorbic Acid (VITAMIN C) 1000 MG tablet Take 1,000 mg by mouth daily.     carvedilol  (COREG ) 25 MG tablet Take 1 tablet (25 mg total) by mouth 2 (two) times daily with a meal. 180 tablet 3   cholecalciferol  (VITAMIN D3) 25 MCG (1000 UNIT) tablet Take 1,000 Units by mouth daily.     cycloSPORINE  (RESTASIS ) 0.05 % ophthalmic emulsion Place 1 drop into both eyes 2 (two) times daily.     DULoxetine  (CYMBALTA ) 60 MG capsule Take 60 mg by mouth 2 (two) times daily.      escitalopram  (LEXAPRO ) 10 MG tablet Take 10 mg by mouth daily.      ferrous sulfate  325 (65 FE) MG tablet Take 325 mg by mouth daily with breakfast.     gabapentin  (NEURONTIN ) 300 MG capsule Take 300-1,200 mg by mouth See admin instructions. Take 1 capsule four times a day, then take 4 capsules at bedtime     ibuprofen (ADVIL) 200 MG tablet Take 200-800 mg by mouth every 6 (six) hours as needed for moderate pain (pain score 4-6).     lamoTRIgine  (LAMICTAL ) 150 MG tablet TAKE 1 TABLET BY MOUTH TWICE A DAY 180 tablet 1   levothyroxine  (SYNTHROID ) 50 MCG tablet TAKE ONE TABLET BY MOUTH ONCE A DAY. TAKE ON AN EMPTY STOMACH WITH A GLASS OF WATER ATLEAST 30-60 MIN BEFORE BREAKFAST 90 tablet 3   MAGNESIUM PO Take 400 mg by mouth 3 (three) times daily.      Melatonin 10 MG TABS Take 2 tablets by mouth at bedtime.      Multiple Vitamin (MULTIVITAMIN)  tablet Take 1 tablet by mouth daily.     olmesartan  (BENICAR ) 40 MG tablet Take 1 tablet (40 mg total) by mouth daily. for blood pressure. 90 tablet 2   primidone  (MYSOLINE ) 50 MG tablet Take 1 tablet (50 mg total) by mouth 2 (two) times daily. 180 tablet 3   rosuvastatin  (CRESTOR ) 40 MG tablet Take 1 tablet (40 mg total) by mouth daily. 90 tablet 3   tiZANidine  (ZANAFLEX ) 4 MG tablet Take 8 mg by mouth at bedtime.     vitamin B-12 (CYANOCOBALAMIN ) 1000 MCG tablet Take 1,000 mcg by mouth daily.     No current facility-administered medications on file prior to visit.    BP 120/64   Pulse 84   Temp 98.7 F (37.1 C) (Temporal)   Ht 5' 5 (1.651 m)   Wt 224 lb (101.6 kg)   LMP 06/01/2013   SpO2 98%   BMI 37.28 kg/m  Objective:   Physical Exam Cardiovascular:     Rate and Rhythm: Normal rate and regular rhythm.  Pulmonary:     Effort: Pulmonary effort is normal.     Breath sounds: Normal breath sounds.  Abdominal:     Tenderness: There is abdominal tenderness in the right upper quadrant. There is no guarding.  Musculoskeletal:     Cervical back: Neck supple.  Skin:    General: Skin is warm and dry.  Neurological:     Mental Status: She is alert and oriented to person, place, and time.  Psychiatric:        Mood and Affect: Mood normal.           Assessment & Plan:  Calculus of gallbladder without cholecystitis without obstruction Assessment & Plan: With recent hospitalization. Hospital notes, labs, imaging reviewed.  We discussed the option for general surgery consultation for cholecystectomy.  She kindly declines for now but will update if her symptoms persist. We discussed common food triggers for cholecystitis.  Rx for Tylenol  with codeine  #3 sent to pharmacy to use as needed.  Exam today overall stable.  Orders: -     Acetaminophen -Codeine ; Take 1 tablet by mouth every 8 (eight) hours as needed for up to 5 days for moderate pain (pain score 4-6) or severe pain  (pain score 7-10).  Dispense: 15 tablet; Refill: 0  Community acquired pneumonia of right middle lobe of lung Assessment & Plan: With recent hospitalization.  Hospital notes, labs, imaging reviewed. Respiratory exam today reassuring.  She will follow-up with pulmonology next week as scheduled. We discussed the need for repeat CT chest 4 to 6 weeks after her initial diagnosis.  She will discuss this with her pulmonologist.         Comer MARLA Gaskins, NP

## 2024-03-10 NOTE — Assessment & Plan Note (Addendum)
 With recent hospitalization. Hospital notes, labs, imaging reviewed.  We discussed the option for general surgery consultation for cholecystectomy.  She kindly declines for now but will update if her symptoms persist. We discussed common food triggers for cholecystitis.  Rx for Tylenol  with codeine  #3 sent to pharmacy to use as needed.  Exam today overall stable.

## 2024-03-17 ENCOUNTER — Encounter: Payer: Self-pay | Admitting: Internal Medicine

## 2024-03-17 ENCOUNTER — Telehealth: Payer: Self-pay | Admitting: Primary Care

## 2024-03-17 ENCOUNTER — Ambulatory Visit: Admitting: Internal Medicine

## 2024-03-17 VITALS — BP 120/82 | HR 92 | Temp 98.3°F | Ht 65.0 in | Wt 224.0 lb

## 2024-03-17 DIAGNOSIS — Z22322 Carrier or suspected carrier of Methicillin resistant Staphylococcus aureus: Secondary | ICD-10-CM | POA: Diagnosis not present

## 2024-03-17 DIAGNOSIS — J15212 Pneumonia due to Methicillin resistant Staphylococcus aureus: Secondary | ICD-10-CM | POA: Diagnosis not present

## 2024-03-17 MED ORDER — MUPIROCIN 2 % EX OINT: 1.0000 | TOPICAL_OINTMENT | Freq: Two times a day (BID) | CUTANEOUS | 0 refills | Status: DC

## 2024-03-17 MED ORDER — STIOLTO RESPIMAT 2.5-2.5 MCG/ACT IN AERS: 2.0000 | INHALATION_SPRAY | Freq: Every day | RESPIRATORY_TRACT | 5 refills | Status: DC

## 2024-03-17 NOTE — Addendum Note (Signed)
 Addended by: MOODY RANCHER on: 03/17/2024 03:30 PM   Modules accepted: Orders

## 2024-03-17 NOTE — Telephone Encounter (Signed)
 Copied from CRM [id number]. Topic: Clinical - Prescription Issue >> Mar 17, 2024  3:28 PM Nathanel DEL wrote: Reason for CRM: the pharmacist chris w/ [facility] pharmacy states the abx ointment, mupirocin  ointment (BACTROBAN ) 2%  sent in today by Dr Meade was a 10 gram tube.  They only carry the 22 gm tube.  Cost is only $5 so Medford said he is going to make the decision to dispense the 22 oz tube, just so the pt does not have to go all weekend w/out this medication.                            [facility] - [place], [place] - [address]

## 2024-03-17 NOTE — Progress Notes (Signed)
 Jamie Patterson    [id number]    29-Jan-1961  Primary Care Physician:Clark, Comer POUR, NP Date of Appointment: 03/17/2024 Established Patient Visit  Chief complaint:   Chief Complaint  Patient presents with   Follow-up   Cough   Shortness of Breath    Hospitalized  for pneumonia 02/26/2024     HPI: Jamie Patterson is a 63 y.o. woman with former tobacco use disorder and recurrent bronchitis and pneumonia.  Hospitalized for hemoptysis in 2023 and had a bronchoscopy negative for DAH. Cultures grew few haemophilus influenza and MRSA, AFB negative. Hospitalized again in July 2025 for MRSA pneumonia. She also has history of MRSA sinus infections and has seen an ENT in [place] in the past.   Interval Updates: Here for hospital follow up. Diagnosed with RML pneumonia. MRSA cultures positive. MRSA cultures nares positive in the hospital.   Takes albuterol  about once a week.   Mobility limited due to ankle.   She continues to have chronic cough and shortness of breath. Cough is productive especially in the mornings with brown mucus. Also wakes her up at night.     I have reviewed the patient's family social and past medical history and updated as appropriate.   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   Trimalleolar fracture on right s/p fusion     Benign essential tremor    worsened by anxiety   Chronic pain due to injury    at R ankle due to ankle fracture   Depression    followed by psych   GERD (gastroesophageal reflux disease)    Hemoptysis 03/03/2022   History of hypertension    HLD (hyperlipidemia)    Imbalance    likely multifactorial (s/p balance training at [facility] 08/2010) ?CMT   Influenza A 09/15/2018   Iron deficiency anemia    LLL pneumonia 10/05/2018   Right ankle pain 2003   trimalleolar fx s/p fusion   Seizure (HCC)    focal, ex vacuo ventriculomegaly?, pending neuro w/u with sleep deprived EEG    Syncope and collapse 08/19/2011    Past Surgical History:  Procedure Laterality Date   ANKLE FUSION  02/2004   Right-50% disability    BRONCHIAL WASHINGS  03/04/2022   Procedure: BRONCHIAL WASHINGS;  Surgeon: Geronimo Amel, MD;  Location: [facility];  Service: Endoscopy;;   CARPAL TUNNEL RELEASE  12/2017   L CTR repair, R thumb A1 pulley release (Gramig)   CESAREAN SECTION     HAMMER TOE SURGERY  09/12/2019   hospitalization  08/2011   syncope thought 2/2 seizure, MRI - Hyperintensity in the cerebral white matter bilaterally is nonspecific, started on Keppra    ORIF ANKLE FRACTURE  06/2003   Right   VIDEO BRONCHOSCOPY Bilateral 03/04/2022   Procedure: VIDEO BRONCHOSCOPY WITHOUT FLUORO;  Surgeon: Geronimo Amel, MD;  Location: [facility];  Service: Endoscopy;  Laterality: Bilateral;    Family History  Problem Relation Age of Onset   Depression Mother    Arrhythmia Mother 75       Dx with VTach after having sudden collapse, did NOT have h/o AMI's    Atrial fibrillation Father    Hypertension Father    Osteoarthritis Father    Aortic aneurysm Father    Hepatitis Brother        Hep. C   Anemia Sister     Social History   Occupational History  Occupation: Disabled    Comment: depression   Occupation: DISABILITY    Employer: UNEMPLOYED  Tobacco Use   Smoking status: Former    Current packs/day: 0.00    Average packs/day: 1 pack/day for 20.0 years (20.0 ttl pk-yrs)    Types: Cigarettes    Start date: 08/12/1975    Quit date: 08/12/1995    Years since quitting: 28.6   Smokeless tobacco: Never  Vaping Use   Vaping status: Never Used  Substance and Sexual Activity   Alcohol use: No    Alcohol/week: 0.0 standard drinks of alcohol    Comment: occassinal   Drug use: No   Sexual activity: Not on file     Physical Exam: Blood pressure 120/82, pulse 92, temperature 98.3 F (36.8 C), temperature source Oral, height 5' 5 (1.651 m), weight 224 lb (101.6 kg), last  menstrual period 06/01/2013, SpO2 98%.  Gen:      No acute distress, frequent coughing Lungs:  ctab no wheezes or crackles CV:        RRR no mrg   Data Reviewed: Imaging: CT Chest July 2023 reviewed - multifocal tree in bud opacities consistent with infection.  CT sinuses July 2023 - severe pansinusitis PFTs:      Latest Ref Rng & Units 01/14/2021    9:56 AM  PFT Results  FVC-Pre L 2.90   FVC-Predicted Pre % 78   FVC-Post L 2.72   FVC-Predicted Post % 73   Pre FEV1/FVC % % 72   Post FEV1/FCV % % 71   FEV1-Pre L 2.09   FEV1-Predicted Pre % 72   FEV1-Post L 1.94   DLCO uncorrected ml/min/mmHg 20.62   DLCO UNC% % 91   DLCO corrected ml/min/mmHg 20.62   DLCO COR %Predicted % 91   DLVA Predicted % 98   TLC L 5.80   TLC % Predicted % 105   RV % Predicted % 123    I have personally reviewed the patient's PFTs and they show normal pulmonary function.   Labs: Lab Results  Component Value Date   WBC 4.9 03/01/2024   HGB 11.4 (L) 03/01/2024   HCT 33.3 (L) 03/01/2024   MCV 97.7 03/01/2024   PLT 299 03/01/2024   Lab Results  Component Value Date   NA 135 03/01/2024   K 3.7 03/01/2024   CO2 26 03/01/2024   GLUCOSE 76 03/01/2024   BUN 5 (L) 03/01/2024   CREATININE 0.77 03/01/2024   CALCIUM  9.5 03/01/2024   GFR 88.78 05/06/2023   GFRNONAA >60 03/01/2024   Micro cultures reviewed from bronchoscopy 2023 - afb negative, bronch cultures showed h flu and mrsa.   Immunization status: Immunization History  Administered Date(s) Administered   Influenza Split 08/19/2012   Influenza, Seasonal, Injecte, Preservative Fre 05/06/2023   Influenza,inj,Quad PF,6+ Mos 05/04/2015, 09/04/2018, 04/07/2019, 08/14/2020, 05/01/2022   PFIZER(Purple Top)SARS-COV-2 Vaccination 10/31/2019, 11/21/2019   Pneumococcal Conjugate-13 05/04/2015   Tdap 08/28/2011   Zoster Recombinant(Shingrix) 04/13/2020    Assessment:  Recurrent MRSA Pneumonia Severe pansinusitis Recurrent hemoptysis (last in  2024) Hypogammaglobulinemia MRSA colonization History of smoking , normal pulmonary function  Plan/Recommendations:  Sorry to hear you were in the hospital again.   I am going to recommend trying an inhaler called stiolto to see if it helps your breathing.   You need a repeat chest xray in 4 weeks to ensure resolution of the pneumonia.   Continue albuterol  inhaler as needed.   I am going to order some antibody levels  to see if low antibody levels are to blame for your recurrent infections. If antibody levels are low, replacing them with infusions can improve your infection symptoms.   In the mean time we will prescribe mupirocin  ointment to clear the mrsa from your nasal passages.   Return to Care: Return in about 4 weeks (around 04/14/2024) for APP visit. And chest xray   Verdon Gore, MD Pulmonary and Critical Care Medicine [facility] Office:[phone]

## 2024-03-17 NOTE — Patient Instructions (Addendum)
 It was a pleasure to see you today!  Please schedule follow up with myself in 4 weeks.  If my schedule is not open yet, we will contact you with a reminder closer to that time. Please call [phone] if you haven't heard from us  a month before, and always call us  sooner if issues or concerns arise. You can also send us  a message through MyChart, but but aware that this is not to be used for urgent issues and it may take up to 5-7 days to receive a reply. Please be aware that you will likely be able to view your results before I have a chance to respond to them. Please give us  5 business days to respond to any non-urgent results.    Before your next visit I would like you to have: Chest xray in 4 weeks before next visit  Sorry to hear you were in the hospital again.   I am going to recommend trying an inhaler called stiolto to see if it helps your breathing.   You need a repeat chest xray in 4 weeks to ensure resolution of the pneumonia.   Continue albuterol  inhaler as needed.   I am going to order some antibody levels to see if low antibody levels are to blame for your recurrent infections. If antibody levels are low, replacing them with infusions can improve your infection symptoms.   In the mean time we will prescribe mupirocin  ointment to clear the mrsa from your nasal passages.

## 2024-03-18 ENCOUNTER — Ambulatory Visit: Payer: Self-pay | Admitting: Internal Medicine

## 2024-03-18 DIAGNOSIS — K8020 Calculus of gallbladder without cholecystitis without obstruction: Secondary | ICD-10-CM

## 2024-03-18 LAB — IGG, IGA, IGM
IgG (Immunoglobin G), Serum: 848 mg/dL (ref 600–1540)
IgM, Serum: 72 mg/dL (ref 50–300)
Immunoglobulin A: 379 mg/dL — ABNORMAL HIGH (ref 70–320)

## 2024-03-18 NOTE — Telephone Encounter (Signed)
 FYI

## 2024-03-22 ENCOUNTER — Ambulatory Visit: Payer: Self-pay | Admitting: General Surgery

## 2024-03-22 ENCOUNTER — Telehealth: Payer: Self-pay | Admitting: *Deleted

## 2024-03-22 ENCOUNTER — Encounter: Payer: Self-pay | Admitting: General Surgery

## 2024-03-22 VITALS — BP 135/79 | HR 88 | Ht 65.0 in | Wt 225.6 lb

## 2024-03-22 DIAGNOSIS — K8050 Calculus of bile duct without cholangitis or cholecystitis without obstruction: Secondary | ICD-10-CM

## 2024-03-22 NOTE — Telephone Encounter (Signed)
 Faxed Pulmonary Clearance to Dr Verdon Gore,  Pulmonary at [phone]

## 2024-03-22 NOTE — Patient Instructions (Signed)
 You have requested to have your gallbladder removed. This will be done at [facility] with Dr. Maurine Minister.  If you are on any injectable weight loss medication, you will need to stop taking your GLP-1 injectable (weight loss) medications 8 days before your surgery to avoid any complications with anesthesia.   You will most likely be out of work 1-2 weeks for this surgery.  If you have FMLA or disability paperwork that needs filled out you may drop this off at our office or this can be faxed to [phone].  You will return after your post-op appointment with a lifting restriction for approximately 4 more weeks.  You will be able to eat anything you would like to following surgery. But, start by eating a bland diet and advance this as tolerated. The Gallbladder diet is below, please go as closely by this diet as possible prior to surgery to avoid any further attacks.  Please see the (blue)pre-care form that you have been given today. Our surgery scheduler will call you to verify surgery date and to go over information.   If you have any questions, please call our office.  Laparoscopic Cholecystectomy Laparoscopic cholecystectomy is surgery to remove the gallbladder. The gallbladder is located in the upper right part of the abdomen, behind the liver. It is a storage sac for bile, which is produced in the liver. Bile aids in the digestion and absorption of fats. Cholecystectomy is often done for inflammation of the gallbladder (cholecystitis). This condition is usually caused by a buildup of gallstones (cholelithiasis) in the gallbladder. Gallstones can block the flow of bile, and that can result in inflammation and pain. In severe cases, emergency surgery may be required. If emergency surgery is not required, you will have time to prepare for the procedure. Laparoscopic surgery is an alternative to open surgery. Laparoscopic surgery has a shorter recovery time. Your common bile duct may also need  to be examined during the procedure. If stones are found in the common bile duct, they may be removed. LET [facility] CARE PROVIDER KNOW ABOUT: Any allergies you have. All medicines you are taking, including vitamins, herbs, eye drops, creams, and over-the-counter medicines. Previous problems you or members of your family have had with the use of anesthetics. Any blood disorders you have. Previous surgeries you have had.  Any medical conditions you have. RISKS AND COMPLICATIONS Generally, this is a safe procedure. However, problems may occur, including: Infection. Bleeding. Allergic reactions to medicines. Damage to other structures or organs. A stone remaining in the common bile duct. A bile leak from the cyst duct that is clipped when your gallbladder is removed. The need to convert to open surgery, which requires a larger incision in the abdomen. This may be necessary if your surgeon thinks that it is not safe to continue with a laparoscopic procedure. BEFORE THE PROCEDURE Ask your health care provider about: Changing or stopping your regular medicines. This is especially important if you are taking diabetes medicines or blood thinners. Taking medicines such as aspirin and ibuprofen. These medicines can thin your blood. Do not take these medicines before your procedure if your health care provider instructs you not to. Follow instructions from your health care provider about eating or drinking restrictions. Let your health care provider know if you develop a cold or an infection before surgery. Plan to have someone take you home after the procedure. Ask your health care provider how your surgical site will be marked or identified. You may  be given antibiotic medicine to help prevent infection. PROCEDURE To reduce your risk of infection: Your health care team will wash or sanitize their hands. Your skin will be washed with soap. An IV tube may be inserted into one of your  veins. You will be given a medicine to make you fall asleep (general anesthetic). A breathing tube will be placed in your mouth. The surgeon will make several small cuts (incisions) in your abdomen. A thin, lighted tube (laparoscope) that has a tiny camera on the end will be inserted through one of the small incisions. The camera on the laparoscope will send a picture to a TV screen (monitor) in the operating room. This will give the surgeon a good view inside your abdomen. A gas will be pumped into your abdomen. This will expand your abdomen to give the surgeon more room to perform the surgery. Other tools that are needed for the procedure will be inserted through the other incisions. The gallbladder will be removed through one of the incisions. After your gallbladder has been removed, the incisions will be closed with stitches (sutures), staples, or skin glue. Your incisions may be covered with a bandage (dressing). The procedure may vary among health care providers and hospitals. AFTER THE PROCEDURE Your blood pressure, heart rate, breathing rate, and blood oxygen level will be monitored often until the medicines you were given have worn off. You will be given medicines as needed to control your pain.   This information is not intended to replace advice given to you by your health care provider. Make sure you discuss any questions you have with your health care provider.   Document Released: 07/28/2005 Document Revised: 04/18/2015 Document Reviewed: 03/09/2013 Elsevier Interactive Patient Education 2016 Elsevier Inc.   Low-Fat Diet for Gallbladder Conditions A low-fat diet can be helpful if you have pancreatitis or a gallbladder condition. With these conditions, your pancreas and gallbladder have trouble digesting fats. A healthy eating plan with less fat will help rest your pancreas and gallbladder and reduce your symptoms. WHAT DO I NEED TO KNOW ABOUT THIS DIET? Eat a low-fat  diet. Reduce your fat intake to less than 20-30% of your total daily calories. This is less than 50-60 g of fat per day. Remember that you need some fat in your diet. Ask your dietician what your daily goal should be. Choose nonfat and low-fat healthy foods. Look for the words "nonfat," "low fat," or "fat free." As a guide, look on the label and choose foods with less than 3 g of fat per serving. Eat only one serving. Avoid alcohol. Do not smoke. If you need help quitting, talk with your health care provider. Eat small frequent meals instead of three large heavy meals. WHAT FOODS CAN I EAT? Grains Include healthy grains and starches such as potatoes, wheat bread, fiber-rich cereal, and brown rice. Choose whole grain options whenever possible. In adults, whole grains should account for 45-65% of your daily calories.  Fruits and Vegetables Eat plenty of fruits and vegetables. Fresh fruits and vegetables add fiber to your diet. Meats and Other Protein Sources Eat lean meat such as chicken and pork. Trim any fat off of meat before cooking it. Eggs, fish, and beans are other sources of protein. In adults, these foods should account for 10-35% of your daily calories. Dairy Choose low-fat milk and dairy options. Dairy includes fat and protein, as well as calcium.  Fats and Oils Limit high-fat foods such as fried foods, sweets, baked  goods, sugary drinks.  Other Creamy sauces and condiments, such as mayonnaise, can add extra fat. Think about whether or not you need to use them, or use smaller amounts or low fat options. WHAT FOODS ARE NOT RECOMMENDED? High fat foods, such as: [employer]. Ice cream. [place] toast. Sweet rolls. Pizza. Cheese bread. Foods covered with batter, butter, creamy sauces, or cheese. Fried foods. Sugary drinks and desserts. Foods that cause gas or bloating   This information is not intended to replace advice given to you by your health care provider. Make sure you  discuss any questions you have with your health care provider.   Document Released: 08/02/2013 Document Reviewed: 08/02/2013 Elsevier Interactive Patient Education [employer]

## 2024-03-23 ENCOUNTER — Telehealth: Payer: Self-pay | Admitting: General Surgery

## 2024-03-23 NOTE — Telephone Encounter (Signed)
 Patient has been advised of Pre-Admission date/time, and Surgery date at [facility].  Surgery Date: 04/22/24 Preadmission Testing Date: 04/15/24 (phone 8a-1p)  Patient informed of the scheduling process and surgery information given at time of office visit.  Patient has been made aware to call [phone], between 1-3:00pm the day before surgery, to find out what time to arrive for surgery.

## 2024-03-24 ENCOUNTER — Telehealth: Payer: Self-pay | Admitting: Primary Care

## 2024-03-24 ENCOUNTER — Telehealth: Payer: Self-pay | Admitting: Internal Medicine

## 2024-03-24 DIAGNOSIS — J189 Pneumonia, unspecified organism: Secondary | ICD-10-CM

## 2024-03-24 NOTE — Telephone Encounter (Signed)
 Unable to reach patient. Left voicemail to return call to our office.

## 2024-03-24 NOTE — Telephone Encounter (Signed)
 Copied from CRM [id number]. Topic: General - Other >> Mar 24, 2024 12:41 PM Ismael A wrote: Reason for CRM: patient states she needs a chest xray as per requested by her pulmonologist   Spoke to pt, let her know we only do xrays/labs for orders placed by [employer] providers. Can another order for xray be placed for pt to be able to have xray done in our office? Pt requested a cb if order can be placed or not?

## 2024-03-24 NOTE — Telephone Encounter (Signed)
 Please notify patient that I ordered a chest x-ray for her to complete.  I do recommend she wait 1 more week from today before she gets the chest x-ray unless she was told differently from her pulmonologist.  Recommendation is for 4 weeks past pneumonia infection.

## 2024-03-24 NOTE — Telephone Encounter (Signed)
 Fax received from Dr. Jayson Endow with [facility] to perform a robot assisted laparoscopic cholecystectomy on patient.  Patient needs surgery clearance. Surgery is 04/22/24. Patient was seen on 03/17/24 . Office protocol is a risk assessment can be sent to surgeon if patient has been seen in 60 days or less.   Pt had PNA last visit and has f/u with Dr Meade to ensure resolution of this on 04/18/24. Will hold in clearance pool until appt is complete with risk assessment.

## 2024-03-24 NOTE — Addendum Note (Signed)
 Addended by: Deshone Lyssy K on: 03/24/2024 03:17 PM   Modules accepted: Orders

## 2024-03-28 ENCOUNTER — Ambulatory Visit: Admitting: Pharmacist

## 2024-03-28 ENCOUNTER — Ambulatory Visit: Admitting: Nurse Practitioner

## 2024-03-28 NOTE — Telephone Encounter (Signed)
 Called patient and reviewed all information. Patient verbalized understanding. Will call if any further questions.

## 2024-03-31 NOTE — Progress Notes (Signed)
 Patient ID: Jamie Patterson, female   DOB: 05/26/1961, 63 y.o.   MRN: [id number] CC: Biliary Colic History of Present Illness Jamie Patterson is a 63 y.o. female with past medical history as below who presents in consultation for biliary colic.  The patient was recently admitted to the hospital for pneumonia.  At that time she also underwent a CT scan that showed gallstones.  She was treated for her pneumonia because she continued to have right upper quadrant pain.  She says that she has postprandial right upper quadrant pain associated with some nausea.  She has an ultrasound that I personally reviewed that shows stones within the gallbladder lumen.  Past Medical History 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   Trimalleolar fracture on right s/p fusion     Benign essential tremor    worsened by anxiety   Chronic pain due to injury    at R ankle due to ankle fracture   Depression    followed by psych   GERD (gastroesophageal reflux disease)    Hemoptysis 03/03/2022   History of hypertension    HLD (hyperlipidemia)    Imbalance    likely multifactorial (s/p balance training at [facility] 08/2010) ?CMT   Influenza A 09/15/2018   Iron deficiency anemia    LLL pneumonia 10/05/2018   Right ankle pain 2003   trimalleolar fx s/p fusion   Seizure (HCC)    focal, ex vacuo ventriculomegaly?, pending neuro w/u with sleep deprived EEG   Syncope and collapse 08/19/2011       Past Surgical History:  Procedure Laterality Date   ANKLE FUSION  02/2004   Right-50% disability    BRONCHIAL WASHINGS  03/04/2022   Procedure: BRONCHIAL WASHINGS;  Surgeon: Geronimo Amel, MD;  Location: [facility];  Service: Endoscopy;;   CARPAL TUNNEL RELEASE  12/2017   L CTR repair, R thumb A1 pulley release (Gramig)   CESAREAN SECTION     HAMMER TOE SURGERY  09/12/2019   hospitalization  08/2011   syncope thought 2/2 seizure, MRI - Hyperintensity in the cerebral white matter  bilaterally is nonspecific, started on Keppra    ORIF ANKLE FRACTURE  06/2003   Right   VIDEO BRONCHOSCOPY Bilateral 03/04/2022   Procedure: VIDEO BRONCHOSCOPY WITHOUT FLUORO;  Surgeon: Geronimo Amel, MD;  Location: [facility];  Service: Endoscopy;  Laterality: Bilateral;    Allergies  Allergen Reactions   Amlodipine  Swelling    Ankle edema    Current Outpatient Medications  Medication Sig Dispense Refill   albuterol  (VENTOLIN  HFA) 108 (90 Base) MCG/ACT inhaler INHALE TWO PUFFS INTO THE LUNGS EVERY SIX HOURS AS NEEDED FOR WHEEZING OR SHORTNESS OF BREATH. 8.5 g 0   Ascorbic Acid (VITAMIN C) 1000 MG tablet Take 1,000 mg by mouth daily.     carvedilol  (COREG ) 25 MG tablet Take 1 tablet (25 mg total) by mouth 2 (two) times daily with a meal. 180 tablet 3   cholecalciferol  (VITAMIN D3) 25 MCG (1000 UNIT) tablet Take 1,000 Units by mouth daily.     cycloSPORINE  (RESTASIS ) 0.05 % ophthalmic emulsion Place 1 drop into both eyes 2 (two) times daily.     DULoxetine  (CYMBALTA ) 60 MG capsule Take 60 mg by mouth 2 (two) times daily.      escitalopram  (LEXAPRO ) 10 MG tablet Take 10 mg by mouth daily.      gabapentin  (NEURONTIN ) 300 MG capsule Take 300-1,200 mg by mouth See admin instructions.  Take 1 capsule four times a day, then take 4 capsules at bedtime     ibuprofen (ADVIL) 200 MG tablet Take 200-800 mg by mouth every 6 (six) hours as needed for moderate pain (pain score 4-6).     lamoTRIgine  (LAMICTAL ) 150 MG tablet TAKE 1 TABLET BY MOUTH TWICE A DAY 180 tablet 1   levothyroxine  (SYNTHROID ) 50 MCG tablet TAKE ONE TABLET BY MOUTH ONCE A DAY. TAKE ON AN EMPTY STOMACH WITH A GLASS OF WATER ATLEAST 30-60 MIN BEFORE BREAKFAST 90 tablet 3   MAGNESIUM PO Take 400 mg by mouth 3 (three) times daily.      Melatonin 10 MG TABS Take 2 tablets by mouth at bedtime.      Multiple Vitamin (MULTIVITAMIN) tablet Take 1 tablet by mouth daily.     mupirocin  ointment (BACTROBAN ) 2 % Apply 1 Application topically  2 (two) times daily. Apply pea sized amount to each nare with qtip twice daily for 5 days. 10 g 0   olmesartan  (BENICAR ) 40 MG tablet Take 1 tablet (40 mg total) by mouth daily. for blood pressure. 90 tablet 2   primidone  (MYSOLINE ) 50 MG tablet Take 1 tablet (50 mg total) by mouth 2 (two) times daily. 180 tablet 3   rosuvastatin  (CRESTOR ) 40 MG tablet Take 1 tablet (40 mg total) by mouth daily. 90 tablet 3   Tiotropium Bromide-Olodaterol (STIOLTO RESPIMAT ) 2.5-2.5 MCG/ACT AERS Inhale 2 puffs into the lungs daily. 1 each 5   tiZANidine  (ZANAFLEX ) 4 MG tablet Take 8 mg by mouth at bedtime.     vitamin B-12 (CYANOCOBALAMIN ) 1000 MCG tablet Take 1,000 mcg by mouth daily.     No current facility-administered medications for this visit.    Family History Family History  Problem Relation Age of Onset   Depression Mother    Arrhythmia Mother 83       Dx with VTach after having sudden collapse, did NOT have h/o AMI's    Atrial fibrillation Father    Hypertension Father    Osteoarthritis Father    Aortic aneurysm Father    Hepatitis Brother        Hep. C   Anemia Sister        Social History Social History   Tobacco Use   Smoking status: Former    Current packs/day: 0.00    Average packs/day: 1 pack/day for 20.0 years (20.0 ttl pk-yrs)    Types: Cigarettes    Start date: 08/12/1975    Quit date: 08/12/1995    Years since quitting: 28.6   Smokeless tobacco: Never  Vaping Use   Vaping status: Never Used  Substance Use Topics   Alcohol use: No    Alcohol/week: 0.0 standard drinks of alcohol    Comment: occassinal   Drug use: No        ROS Full ROS of systems performed and is otherwise negative there than what is stated in the HPI  Physical Exam Blood pressure 135/79, pulse 88, height 5' 5 (1.651 m), weight 225 lb 9.6 oz (102.3 kg), last menstrual period 06/01/2013, SpO2 96%.  Alert and oriented x 3, normal breathing on room air, regular rate and rhythm, abdomen soft, non  distended, mild tenderness in the right upper quadrant without a Murphy sign.  Data Reviewed Reviewed her ultrasound that showed evidence of gallstones.  I have personally reviewed the patient's imaging and medical records.    Assessment/Plan    Patient with biliary colic.  I discussed the recommendation  of robotic assisted cholecystectomy.  Given that she just had pneumonia we will get a repeat chest x-ray on 8 September and she is seeing her pulmonologist.  We will have them perform pulmonology restratification if she is at acceptable risk for surgery we will proceed.  I discussed the risk, benefits alternatives of robotic assisted cholecystectomy including risk of infection, bleeding, retained stone, bile leak and injury to the common bile duct.  She understands these risks and wishes to proceed with surgery   Jayson MALVA Endow

## 2024-04-12 NOTE — Progress Notes (Unsigned)
 Assessment/Plan:   1.  Essential Tremor, exacerbated by GAD             -continue primidone , 50 mg bid.  Discussed that we could increase it prn but she wanted to hold for now.             -her propranolol  was d/c by primary nurse practitioner back in December in exchange for carvidilol for her blood pressure. 2.  Chronic migraine/chronic daily headache  -okay right now 3.  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 Vincente 4.  Hypertension             -Patient on 2 antihypertensives.   5.  Cough  -Following with pulmonary.  They feel that some is due to reflux.  They are also treating her gabapentin  for neurogenic cough but she has seen ENT for cough as well  6.  Falls  -due to chronic ankle issue on the R per patient.    -needs to use walker and I reiterated this today.  She has had some serious falls. 7.  Myoclonus  -improved on lower dose of gabapentin , 300 mg, 2 po bid.  8.  Follow-up yearly Subjective:   Jamie Patterson was seen today in follow up for essential tremor.  My previous records were reviewed prior to todays visit.  She remains on primidone  and propranolol .  Tremor is stable, with better and worse days.  She remains on primidone  but her propranolol  was d/c back in december.  She was unsure why that was.  I took a dive back into her chart and it was noted by her primary care nurse practitioner that the patient stated that it was not helping the tremor and it was discontinued and Coreg  was started for her blood pressure.  The patient denies that she ever noted it was not helping for the tremor.  Overall, she is not sure the tremor has necessarily changed much.  She was in the hospital in July with pneumonia.  She is getting ready to have a cholecystectomy September 12.  Notes are reviewed.  She has had falls that are related to her ankle.  She  had a big fall in May where she fell and hit her head on sidewalk.  She fell yesterday going up the ramp to her house.  She hasn't used the cane/walker with a fall.  Myoclonus is better, but not resolved.  She is taking gabapentin , 300 mg, 2 po bid  Current prescribed movement disorder medications: Primidone , 50 mg twice daily  Propranolol , 40 mg daily    ALLERGIES:   Allergies  Allergen Reactions   Amlodipine  Swelling    Ankle edema    CURRENT MEDICATIONS:  Outpatient Encounter Medications as of 04/14/2024  Medication Sig   albuterol  (VENTOLIN  HFA) 108 (90 Base) MCG/ACT inhaler INHALE TWO PUFFS INTO THE LUNGS EVERY SIX HOURS AS NEEDED FOR WHEEZING OR SHORTNESS OF BREATH.   Ascorbic Acid (VITAMIN C) 1000 MG tablet Take 1,000 mg by mouth daily.   carvedilol  (COREG ) 25 MG tablet Take 1 tablet (25 mg total) by mouth 2 (two) times daily with a meal.   cholecalciferol  (VITAMIN D3) 25 MCG (1000 UNIT) tablet Take 1,000 Units by mouth daily.   cycloSPORINE  (RESTASIS ) 0.05 % ophthalmic emulsion  Place 1 drop into both eyes 2 (two) times daily.   DULoxetine  (CYMBALTA ) 60 MG capsule Take 60 mg by mouth 2 (two) times daily.    escitalopram  (LEXAPRO ) 10 MG tablet Take 10 mg by mouth daily.    gabapentin  (NEURONTIN ) 300 MG capsule Take 300-600 mg by mouth See admin instructions. Take 300 mg by mouth in the morning and afternoon and take 600 mg at night   ibuprofen (ADVIL) 200 MG tablet Take 200-800 mg by mouth every 6 (six) hours as needed for moderate pain (pain score 4-6).   lamoTRIgine  (LAMICTAL ) 150 MG tablet TAKE 1 TABLET BY MOUTH TWICE A DAY   levothyroxine  (SYNTHROID ) 50 MCG tablet TAKE ONE TABLET BY MOUTH ONCE A DAY. TAKE ON AN EMPTY STOMACH WITH A GLASS OF WATER ATLEAST 30-60 MIN BEFORE BREAKFAST   MAGNESIUM PO Take 400 mg by mouth 3 (three) times daily.    Melatonin 10 MG TABS Take 20 mg by mouth at bedtime.   Multiple Vitamin (MULTIVITAMIN) tablet Take 1 tablet by mouth daily.   olmesartan   (BENICAR ) 40 MG tablet Take 1 tablet (40 mg total) by mouth daily. for blood pressure.   primidone  (MYSOLINE ) 50 MG tablet Take 1 tablet (50 mg total) by mouth 2 (two) times daily.   rosuvastatin  (CRESTOR ) 40 MG tablet Take 1 tablet (40 mg total) by mouth daily.   Tiotropium Bromide-Olodaterol (STIOLTO RESPIMAT ) 2.5-2.5 MCG/ACT AERS Inhale 2 puffs into the lungs daily.   tiZANidine  (ZANAFLEX ) 4 MG tablet Take 8 mg by mouth at bedtime.   vitamin B-12 (CYANOCOBALAMIN ) 1000 MCG tablet Take 1,000 mcg by mouth daily.   [DISCONTINUED] mupirocin  ointment (BACTROBAN ) 2 % Apply 1 Application topically 2 (two) times daily. Apply pea sized amount to each nare with qtip twice daily for 5 days. (Patient not taking: Reported on 04/12/2024)   No facility-administered encounter medications on file as of 04/14/2024.     Objective:    PHYSICAL EXAMINATION:    VITALS:   Vitals:   04/14/24 1302  BP: (!) 142/88  Pulse: (!) 103  SpO2: 96%  Weight: 226 lb 12.8 oz (102.9 kg)    GEN:  The patient appears stated age and is in NAD. HEENT:  Normocephalic, atraumatic CV:  tachy.  Regular Lungs: CTAB  Neurological examination:  Orientation: The patient is alert and oriented x3. Cranial nerves: There is good facial symmetry. The speech is fluent and clear. Soft palate rises symmetrically and there is no tongue deviation. Hearing is intact to conversational tone. Sensation: Sensation is intact to light touch throughout Motor: Strength is at least antigravity x4.  Movement examination: Tone: There is normal tone in the UE/LE Abnormal movements: no rest tremor.  Very mild postural tremor.  No myoclonus. Coordination:  There is no decremation with RAM's, with any form of RAMS, including alternating supination and pronation of the forearm, hand opening and closing, finger taps, heel taps and toe taps. Gait and Station: The patient has no difficulty arising.  Gait is antalgic and a bit wide-based.  This is stable  from prior. I have reviewed and interpreted the following labs independently   Chemistry      Component Value Date/Time   NA 135 03/01/2024 0526   K 3.7 03/01/2024 0526   CL 98 03/01/2024 0526   CO2 26 03/01/2024 0526   BUN 5 (L) 03/01/2024 0526   CREATININE 0.77 03/01/2024 0526   CREATININE 0.67 02/16/2018 1332      Component Value Date/Time  CALCIUM  9.5 03/01/2024 0526   ALKPHOS 108 03/01/2024 0526   AST 31 03/01/2024 0526   ALT 21 03/01/2024 0526   BILITOT 0.8 03/01/2024 0526   BILITOT 0.2 10/22/2023 0758      Lab Results  Component Value Date   WBC 4.9 03/01/2024   HGB 11.4 (L) 03/01/2024   HCT 33.3 (L) 03/01/2024   MCV 97.7 03/01/2024   PLT 299 03/01/2024   Lab Results  Component Value Date   TSH 7.879 (H) 02/27/2024     Chemistry      Component Value Date/Time   NA 135 03/01/2024 0526   K 3.7 03/01/2024 0526   CL 98 03/01/2024 0526   CO2 26 03/01/2024 0526   BUN 5 (L) 03/01/2024 0526   CREATININE 0.77 03/01/2024 0526   CREATININE 0.67 02/16/2018 1332      Component Value Date/Time   CALCIUM  9.5 03/01/2024 0526   ALKPHOS 108 03/01/2024 0526   AST 31 03/01/2024 0526   ALT 21 03/01/2024 0526   BILITOT 0.8 03/01/2024 0526   BILITOT 0.2 10/22/2023 0758      Total time spent on today's visit was 31 minutes, including both face-to-face time and nonface-to-face time.  Time included that spent on review of records (prior notes available to me/labs/imaging if pertinent), discussing treatment and goals, answering patient's questions and coordinating care.   Cc:  Gretta Comer POUR, NP

## 2024-04-14 ENCOUNTER — Ambulatory Visit (INDEPENDENT_AMBULATORY_CARE_PROVIDER_SITE_OTHER)
Admission: RE | Admit: 2024-04-14 | Discharge: 2024-04-14 | Disposition: A | Discharge status: Home / Self Care | Source: Ambulatory Visit | Attending: Primary Care | Admitting: Primary Care

## 2024-04-14 ENCOUNTER — Other Ambulatory Visit: Payer: Self-pay | Admitting: Radiology

## 2024-04-14 ENCOUNTER — Ambulatory Visit: Payer: Medicare PPO | Admitting: Neurology

## 2024-04-14 VITALS — BP 142/88 | HR 103 | Wt 226.8 lb

## 2024-04-14 DIAGNOSIS — J189 Pneumonia, unspecified organism: Secondary | ICD-10-CM

## 2024-04-14 DIAGNOSIS — G250 Essential tremor: Secondary | ICD-10-CM | POA: Diagnosis not present

## 2024-04-14 DIAGNOSIS — R296 Repeated falls: Secondary | ICD-10-CM

## 2024-04-14 DIAGNOSIS — R251 Tremor, unspecified: Secondary | ICD-10-CM

## 2024-04-14 DIAGNOSIS — J984 Other disorders of lung: Secondary | ICD-10-CM | POA: Diagnosis not present

## 2024-04-14 MED ORDER — PRIMIDONE 50 MG PO TABS: 50.0000 mg | ORAL_TABLET | Freq: Two times a day (BID) | ORAL | 3 refills | Status: AC

## 2024-04-14 NOTE — Patient Instructions (Addendum)
 Use your walker!  The physicians and staff at [facility] are committed to providing excellent care. You may receive a survey requesting feedback about your experience at our office. We strive to receive very good responses to the survey questions. If you feel that your experience would prevent you from giving the office a very good  response, please contact our office to try to remedy the situation. We may be reached at [phone]. Thank you for taking the time out of your busy day to complete the survey.

## 2024-04-15 ENCOUNTER — Other Ambulatory Visit: Payer: Self-pay

## 2024-04-15 ENCOUNTER — Ambulatory Visit: Payer: Self-pay | Admitting: General Surgery

## 2024-04-15 ENCOUNTER — Encounter
Admission: RE | Admit: 2024-04-15 | Discharge: 2024-04-15 | Disposition: A | Discharge status: Home / Self Care | Source: Ambulatory Visit | Attending: General Surgery | Admitting: General Surgery

## 2024-04-15 DIAGNOSIS — K8050 Calculus of bile duct without cholangitis or cholecystitis without obstruction: Secondary | ICD-10-CM

## 2024-04-15 HISTORY — DX: Prediabetes: R73.03

## 2024-04-15 NOTE — Patient Instructions (Addendum)
 Your procedure is scheduled on: Friday 04/22/24 Report to the Registration Desk on the 1st floor of the [facility]. To find out your arrival time, please call [phone] between 1PM - 3PM on: Thursday 04/21/24 If your arrival time is 6:00 am, do not arrive before that time as the [facility] entrance doors do not open until 6:00 am.  REMEMBER: Instructions that are not followed completely may result in serious medical risk, up to and including death; or upon the discretion of your surgeon and anesthesiologist your surgery may need to be rescheduled.  Do not eat food or drink any liquids after midnight the night before surgery.  No gum chewing or hard candies.  One week prior to surgery: Stop Anti-inflammatories (NSAIDS) such as Advil, Aleve , Ibuprofen, Motrin, Naproxen , Naprosyn  and Aspirin  based products such as Excedrin, Goody's Powder, BC Powder.  You may however, continue to take Tylenol  if needed for pain up until the day of surgery.  Stop ANY OVER THE COUNTER supplements and vitamins until after surgery.  Continue taking all of your other prescription medications up until the day of surgery.  ON THE DAY OF SURGERY ONLY TAKE THESE MEDICATIONS WITH SIPS OF WATER:  carvedilol  (COREG ) 25 MG tablet  DULoxetine  (CYMBALTA ) 60 MG capsule  escitalopram  (LEXAPRO ) 10 MG tablet  gabapentin  (NEURONTIN ) 300 MG capsule  lamoTRIgine  (LAMICTAL ) 150 MG tablet  levothyroxine  (SYNTHROID ) 50 MCG tablet  rosuvastatin  (CRESTOR ) 40 MG tablet   Use inhalers on the day of surgery and bring your Albuterol  to the hospital.  No Alcohol for 24 hours before or after surgery.  No Smoking including e-cigarettes for 24 hours before surgery.  No chewable tobacco products for at least 6 hours before surgery.  No nicotine patches on the day of surgery.  Do not use any recreational drugs for at least a week (preferably 2 weeks) before your surgery.  Please be advised that the combination of cocaine and  anesthesia may have negative outcomes, up to and including death. If you test positive for cocaine, your surgery will be cancelled.  On the morning of surgery brush your teeth with toothpaste and water, you may rinse your mouth with mouthwash if you wish. Do not swallow any toothpaste or mouthwash.  Use CHG Soap or wipes as directed on instruction sheet.  Do not wear lotions, powders, or perfumes.  Do not shave body hair from the neck down 48 hours before surgery.  Wear comfortable clothing (specific to your surgery type) to the hospital.  Do not wear jewelry, make-up, hairpins, clips or nail polish.  For welded (permanent) jewelry: bracelets, anklets, waist bands, etc.  Please have this removed prior to surgery.  If it is not removed, there is a chance that hospital personnel will need to cut it off on the day of surgery.  Contact lenses, hearing aids and dentures may not be worn into surgery. Bring a case for your glasses  Do not bring valuables to the hospital. [facility] is not responsible for any missing/lost belongings or valuables.   Notify your doctor if there is any change in your medical condition (cold, fever, infection).  After surgery, you can help prevent lung complications by doing breathing exercises.  Take deep breaths and cough every 1-2 hours. Your doctor may order a device called an Incentive Spirometer to help you take deep breaths. When coughing or sneezing, hold a pillow firmly against your incision with both hands. This is called "splinting." Doing this helps protect your incision.  It also decreases belly discomfort.  If you are being discharged the day of surgery, you will not be allowed to drive home. You will need a responsible individual to drive you home and stay with you for 24 hours after surgery.   Please call the Pre-admissions Testing Dept. at [phone] if you have any questions about these instructions.  Surgery Visitation Policy:  Patients  having surgery or a procedure may have two visitors.  Children under the age of 41 must have an adult with them who is not the patient.   Merchandiser, retail to address health-related social needs:  [email or web address]                                                                                                              Preparing for Surgery with CHLORHEXIDINE GLUCONATE (CHG) Soap  Chlorhexidine Gluconate (CHG) Soap  o An antiseptic cleaner that kills germs and bonds with the skin to continue killing germs even after washing  o Used for showering the night before surgery and morning of surgery  Before surgery, you can play an important role by reducing the number of germs on your skin.  CHG (Chlorhexidine gluconate) soap is an antiseptic cleanser which kills germs and bonds with the skin to continue killing germs even after washing.  Please do not use if you have an allergy  to CHG or antibacterial soaps. If your skin becomes reddened/irritated stop using the CHG.  1. Shower the NIGHT BEFORE SURGERY and the MORNING OF SURGERY with CHG soap.  2. If you choose to wash your hair, wash your hair first as usual with your normal shampoo.  3. After shampooing, rinse your hair and body thoroughly to remove the shampoo.  4. Use CHG as you would any other liquid soap. You can apply CHG directly to the skin and wash gently with a scrungie or a clean washcloth.  5. Apply the CHG soap to your body only from the neck down. Do not use on open wounds or open sores. Avoid contact with your eyes, ears, mouth, and genitals (private parts). Wash face and genitals (private parts) with your normal soap.  6. Wash thoroughly, paying special attention to the area where your surgery will be performed.  7. Thoroughly rinse your body with warm water.  8. Do not shower/wash with your normal soap after using and rinsing off the CHG soap.  9. Pat yourself dry with a clean  towel.  10. Wear clean pajamas to bed the night before surgery.  12. Place clean sheets on your bed the night of your first shower and do not sleep with pets.  13. Shower again with the CHG soap on the day of surgery prior to arriving at the hospital.  14. Do not apply any deodorants/lotions/powders.  15. Please wear clean clothes to the hospital.

## 2024-04-18 ENCOUNTER — Ambulatory Visit: Admitting: Internal Medicine

## 2024-04-18 ENCOUNTER — Encounter: Payer: Self-pay | Admitting: Internal Medicine

## 2024-04-18 VITALS — BP 120/70 | HR 98 | Temp 97.8°F | Ht 65.0 in | Wt 227.0 lb

## 2024-04-18 DIAGNOSIS — J15212 Pneumonia due to Methicillin resistant Staphylococcus aureus: Secondary | ICD-10-CM

## 2024-04-18 DIAGNOSIS — Z87891 Personal history of nicotine dependence: Secondary | ICD-10-CM

## 2024-04-18 DIAGNOSIS — Z01811 Encounter for preprocedural respiratory examination: Secondary | ICD-10-CM

## 2024-04-18 NOTE — Patient Instructions (Signed)
 It was a pleasure to see you today!  Please schedule follow up in 6 months.  If my schedule is not open yet, we will contact you with a reminder closer to that time. Please call [phone] if you haven't heard from us  a month before, and always call us  sooner if issues or concerns arise. You can also send us  a message through MyChart, but but aware that this is not to be used for urgent issues and it may take up to 5-7 days to receive a reply. Please be aware that you will likely be able to view your results before I have a chance to respond to them. Please give us  5 business days to respond to any non-urgent results.    Glad you are feeling better.  Chest xray shows resolving pneumonia.  You can stop the stiolto if not helping.  Continue albuterol  inhaler as needed.  Good luck with the gall bladder surgery! Call and see us  sooner if breathing issues.

## 2024-04-18 NOTE — Telephone Encounter (Signed)
 Copy of risk assessment 04/18/24 was faxed to [facility]

## 2024-04-18 NOTE — Progress Notes (Signed)
 Jamie Patterson    [id number]    1960-08-15  Primary Care Physician:Clark, Comer POUR, NP Date of Appointment: 04/18/2024 Established Patient Visit  Chief complaint:   Chief Complaint  Patient presents with   Medical Management of Chronic Issues    PNA Clearance      HPI: Jamie Patterson is a 63 y.o. woman with former tobacco use disorder and recurrent bronchitis and pneumonia.  Hospitalized for hemoptysis in 2023 and had a bronchoscopy negative for DAH. Cultures grew few haemophilus influenza and MRSA, AFB negative. Hospitalized again in July 2025 for MRSA pneumonia. She also has history of MRSA sinus infections and has seen an ENT in [place] in the past.   Interval Updates: Here for RML MRSA pneumonia follow up. Chest xray shows improving RML consolidation.   She took mupirocin  for mrsa colonization eradication.  She had IgG/IgM/IgA checked. Wnl.   Started stiolto inhaler 2 puffs once a day doesn't feel like it's helping.  Taking albuterol  once a week.   Having outpatient cholecystectomy soon and needs preop evaluation.    I have reviewed the patient's family social and past medical history and updated as appropriate.   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   Trimalleolar fracture on right s/p fusion     Benign essential tremor    worsened by anxiety   Chronic pain due to injury    at R ankle due to ankle fracture   Depression    followed by psych   GERD (gastroesophageal reflux disease)    Hemoptysis 03/03/2022   History of hypertension    HLD (hyperlipidemia)    Hypertension    Imbalance    likely multifactorial (s/p balance training at [facility] 08/2010) ?CMT   Influenza A 09/15/2018   Iron deficiency anemia    LLL pneumonia 10/05/2018   MRSA pneumonia (HCC) 02/2024   Pre-diabetes    Right ankle pain 2003   trimalleolar fx s/p fusion   Seizure (HCC)    focal, ex vacuo ventriculomegaly?, pending neuro  w/u with sleep deprived EEG   Syncope and collapse 08/19/2011    Past Surgical History:  Procedure Laterality Date   ANKLE FUSION  02/2004   Right-50% disability    BRONCHIAL WASHINGS  03/04/2022   Procedure: BRONCHIAL WASHINGS;  Surgeon: Geronimo Amel, MD;  Location: [facility];  Service: Endoscopy;;   CARPAL TUNNEL RELEASE  12/2017   L CTR repair, R thumb A1 pulley release (Gramig)   CESAREAN SECTION     HAMMER TOE SURGERY  09/12/2019   hospitalization  08/2011   syncope thought 2/2 seizure, MRI - Hyperintensity in the cerebral white matter bilaterally is nonspecific, started on Keppra    ORIF ANKLE FRACTURE  06/2003   Right   VIDEO BRONCHOSCOPY Bilateral 03/04/2022   Procedure: VIDEO BRONCHOSCOPY WITHOUT FLUORO;  Surgeon: Geronimo Amel, MD;  Location: [facility];  Service: Endoscopy;  Laterality: Bilateral;    Family History  Problem Relation Age of Onset   Depression Mother    Arrhythmia Mother 90       Dx with VTach after having sudden collapse, did NOT have h/o AMI's    Atrial fibrillation Father    Hypertension Father    Osteoarthritis Father    Aortic aneurysm Father    Hepatitis Brother        Hep. C   Anemia Sister     Social History  Occupational History   Occupation: Disabled    Comment: depression   Occupation: DISABILITY    Employer: UNEMPLOYED  Tobacco Use   Smoking status: Former    Current packs/day: 0.00    Average packs/day: 1 pack/day for 20.0 years (20.0 ttl pk-yrs)    Types: Cigarettes    Start date: 08/12/1975    Quit date: 08/12/1995    Years since quitting: 28.7   Smokeless tobacco: Never  Vaping Use   Vaping status: Never Used  Substance and Sexual Activity   Alcohol use: No    Alcohol/week: 0.0 standard drinks of alcohol    Comment: occassinal   Drug use: No   Sexual activity: Not on file     Physical Exam: Blood pressure 120/70, pulse 98, temperature 97.8 F (36.6 C), temperature source Temporal, height 5' 5 (1.651 m),  weight 227 lb (103 kg), last menstrual period 06/01/2013, SpO2 98%.  Gen:      No distress Lungs:  breathing non labored on room air, ctab no wheeze CV:        RRR no mrg   Data Reviewed: Imaging: Chest xray July 2025 compared to September 2025. Improving RML consolidation.   PFTs:      Latest Ref Rng & Units 01/14/2021    9:56 AM  PFT Results  FVC-Pre L 2.90   FVC-Predicted Pre % 78   FVC-Post L 2.72   FVC-Predicted Post % 73   Pre FEV1/FVC % % 72   Post FEV1/FCV % % 71   FEV1-Pre L 2.09   FEV1-Predicted Pre % 72   FEV1-Post L 1.94   DLCO uncorrected ml/min/mmHg 20.62   DLCO UNC% % 91   DLCO corrected ml/min/mmHg 20.62   DLCO COR %Predicted % 91   DLVA Predicted % 98   TLC L 5.80   TLC % Predicted % 105   RV % Predicted % 123    I have personally reviewed the patient's PFTs and they show normal pulmonary function.   Labs: Lab Results  Component Value Date   WBC 4.9 03/01/2024   HGB 11.4 (L) 03/01/2024   HCT 33.3 (L) 03/01/2024   MCV 97.7 03/01/2024   PLT 299 03/01/2024   Lab Results  Component Value Date   NA 135 03/01/2024   K 3.7 03/01/2024   CO2 26 03/01/2024   GLUCOSE 76 03/01/2024   BUN 5 (L) 03/01/2024   CREATININE 0.77 03/01/2024   CALCIUM  9.5 03/01/2024   GFR 88.78 05/06/2023   GFRNONAA >60 03/01/2024  Antibodoies reviewed - IgG/IgM/IgA wnl in 2025  Micro cultures reviewed from bronchoscopy 2023 - afb negative, bronch cultures showed h flu and mrsa.   Immunization status: Immunization History  Administered Date(s) Administered   Influenza Split 08/19/2012   Influenza, Seasonal, Injecte, Preservative Fre 05/06/2023   Influenza,inj,Quad PF,6+ Mos 05/04/2015, 09/04/2018, 04/07/2019, 08/14/2020, 05/01/2022   PFIZER(Purple Top)SARS-COV-2 Vaccination 10/31/2019, 11/21/2019   Pneumococcal Conjugate-13 05/04/2015   Tdap 08/28/2011   Zoster Recombinant(Shingrix) 04/13/2020    Assessment:  RML pneumonia - improved with symptoms and  imaging Chronic rhinitis Hypogammaglobulinemia - resolved with normal IgG/M/A in 2025 MRSA colonization s/p mupirocin  intranasal treatment in 2025 History of smoking , normal pulmonary function Preoperative pulmonary evaluation for cholecystectomy   Plan/Recommendations:  Glad you are feeling better.  Chest xray shows resolving pneumonia.  You can stop the stiolto if not helping.  Continue albuterol  inhaler as needed.  Good luck with the gall bladder surgery! Call and see us  sooner if  breathing issues.   Shaylan C Brimley has a low risk of post-operative pulmonary complications by ARISCAT Index.  The absolute assessment of risk/benefit of the procedure is deferred to the primary team's evaluation.  RECOMMENDATIONS:  In order to minimize the risk of complications and optimize pulmonary status, we recommend the following: - Encourage aggressive incentive spirometry hourly both peri-operatively and post-operatively as tolerated  - Early ambulation and physical therapy as tolerated post-operatively - Adequate pain control especially in the setting of abdominal and thoracic surgery - Bronchodilators as needed for wheezing or shortness of breath - Intraoperatively keep OR time to the shortest as possible  Preoperative Risk Calculation: Postoperative respiratory failure (PRF) is considered as failure to wean from mechanical ventilation within 48 hours of surgery or unplanned intubation/reintubation postoperatively. The validated risk calculator provides a risk estimate of PRF and is anticipated to aid in surgical decision-making and informed patient consent.  However risk can be accepted given the potential benefit of this intervention and it is not prohibitive.  The features of this patient's history that contribute to the pulmonary risk assessment include: Age, General anesthesia  0 to 25 points: Low risk: 1.6% pulmonary complication rate  26 to 44 points: Intermediate risk: 13.3% pulmonary  complication rate  45 to 123 points: High risk: 42.1% pulmonary complication rate  ARISCAT: Mazo et al. Anesthesiology 2014; [phone]   Return to Care: No follow-ups on file.    Verdon Gore, MD Pulmonary and Critical Care Medicine [facility] Office:[phone]

## 2024-04-20 ENCOUNTER — Telehealth: Payer: Self-pay | Admitting: *Deleted

## 2024-04-20 NOTE — Telephone Encounter (Signed)
 Seri C Dunsmore has a low risk of post-operative pulmonary complications by ARISCAT Index.  The absolute assessment of risk/benefit of the procedure is deferred to the primary team's evaluation.   RECOMMENDATIONS:  In order to minimize the risk of complications and optimize pulmonary status, we recommend the following: - Encourage aggressive incentive spirometry hourly both peri-operatively and post-operatively as tolerated  - Early ambulation and physical therapy as tolerated post-operatively - Adequate pain control especially in the setting of abdominal and thoracic surgery - Bronchodilators as needed for wheezing or shortness of breath - Intraoperatively keep OR time to the shortest as possible

## 2024-04-21 MED ORDER — CHLORHEXIDINE GLUCONATE CLOTH 2 % EX PADS: 6.0000 | MEDICATED_PAD | Freq: Once | CUTANEOUS | Status: DC

## 2024-04-21 MED ORDER — LACTATED RINGERS IV SOLN
INTRAVENOUS | Status: DC
Start: 2024-04-21 — End: 2024-04-22

## 2024-04-21 MED ORDER — SODIUM CHLORIDE 0.9 % IV SOLN
2.0000 g | INTRAVENOUS | Status: AC
  Administered 2024-04-22: 2 g via INTRAVENOUS

## 2024-04-21 MED ORDER — INDOCYANINE GREEN 25 MG IV SOLR
1.2500 mg | Freq: Once | INTRAVENOUS | Status: AC
  Administered 2024-04-22: 1.25 mg via INTRAVENOUS

## 2024-04-21 MED ORDER — ORAL CARE MOUTH RINSE
15.0000 mL | Freq: Once | OROMUCOSAL | Status: AC
Start: 2024-04-21 — End: 2024-04-22

## 2024-04-21 MED ORDER — CHLORHEXIDINE GLUCONATE 0.12 % MT SOLN
15.0000 mL | Freq: Once | OROMUCOSAL | Status: AC
  Administered 2024-04-22: 15 mL via OROMUCOSAL

## 2024-04-22 ENCOUNTER — Other Ambulatory Visit: Payer: Self-pay

## 2024-04-22 ENCOUNTER — Ambulatory Visit: Payer: Self-pay | Admitting: Urgent Care

## 2024-04-22 ENCOUNTER — Ambulatory Visit
Admission: RE | Admit: 2024-04-22 | Discharge: 2024-04-22 | Disposition: A | Discharge status: Home / Self Care | Attending: General Surgery | Admitting: General Surgery

## 2024-04-22 ENCOUNTER — Ambulatory Visit: Admitting: Anesthesiology

## 2024-04-22 ENCOUNTER — Encounter
Admission: RE | Disposition: A | Discharge status: Home / Self Care | Payer: Self-pay | Source: Home / Self Care | Attending: General Surgery

## 2024-04-22 ENCOUNTER — Encounter: Payer: Self-pay | Admitting: General Surgery

## 2024-04-22 DIAGNOSIS — F32A Depression, unspecified: Secondary | ICD-10-CM | POA: Diagnosis not present

## 2024-04-22 DIAGNOSIS — I10 Essential (primary) hypertension: Secondary | ICD-10-CM | POA: Diagnosis not present

## 2024-04-22 DIAGNOSIS — K8020 Calculus of gallbladder without cholecystitis without obstruction: Secondary | ICD-10-CM | POA: Diagnosis not present

## 2024-04-22 DIAGNOSIS — K8050 Calculus of bile duct without cholangitis or cholecystitis without obstruction: Secondary | ICD-10-CM | POA: Diagnosis not present

## 2024-04-22 DIAGNOSIS — K219 Gastro-esophageal reflux disease without esophagitis: Secondary | ICD-10-CM | POA: Diagnosis not present

## 2024-04-22 DIAGNOSIS — J189 Pneumonia, unspecified organism: Secondary | ICD-10-CM | POA: Insufficient documentation

## 2024-04-22 DIAGNOSIS — K8064 Calculus of gallbladder and bile duct with chronic cholecystitis without obstruction: Secondary | ICD-10-CM | POA: Diagnosis not present

## 2024-04-22 DIAGNOSIS — Z87891 Personal history of nicotine dependence: Secondary | ICD-10-CM | POA: Insufficient documentation

## 2024-04-22 DIAGNOSIS — K8010 Calculus of gallbladder with chronic cholecystitis without obstruction: Secondary | ICD-10-CM | POA: Diagnosis not present

## 2024-04-22 SURGERY — CHOLECYSTECTOMY, ROBOT-ASSISTED, LAPAROSCOPIC
Anesthesia: General | Site: Abdomen

## 2024-04-22 MED ORDER — ROCURONIUM BROMIDE 10 MG/ML (PF) SYRINGE
PREFILLED_SYRINGE | INTRAVENOUS | Status: AC
  Filled 2024-04-22: qty 10

## 2024-04-22 MED ORDER — PROPOFOL 10 MG/ML IV BOLUS
INTRAVENOUS | Status: AC
  Filled 2024-04-22: qty 40

## 2024-04-22 MED ORDER — FENTANYL CITRATE (PF) 100 MCG/2ML IJ SOLN
25.0000 ug | INTRAMUSCULAR | Status: DC | PRN
  Administered 2024-04-22 (×2): 50 ug via INTRAVENOUS

## 2024-04-22 MED ORDER — PHENYLEPHRINE 80 MCG/ML (10ML) SYRINGE FOR IV PUSH (FOR BLOOD PRESSURE SUPPORT)
PREFILLED_SYRINGE | INTRAVENOUS | Status: AC
  Filled 2024-04-22: qty 10

## 2024-04-22 MED ORDER — FENTANYL CITRATE (PF) 100 MCG/2ML IJ SOLN
INTRAMUSCULAR | Status: AC
  Filled 2024-04-22: qty 2

## 2024-04-22 MED ORDER — SEVOFLURANE IN SOLN
RESPIRATORY_TRACT | Status: AC
  Filled 2024-04-22: qty 250

## 2024-04-22 MED ORDER — ACETAMINOPHEN 10 MG/ML IV SOLN: 1000.0000 mg | Freq: Once | INTRAVENOUS | Status: DC | PRN

## 2024-04-22 MED ORDER — ONDANSETRON HCL 4 MG/2ML IJ SOLN
INTRAMUSCULAR | Status: AC
  Filled 2024-04-22: qty 2

## 2024-04-22 MED ORDER — ONDANSETRON HCL 4 MG/2ML IJ SOLN
INTRAMUSCULAR | Status: DC | PRN
  Administered 2024-04-22: 4 mg via INTRAVENOUS

## 2024-04-22 MED ORDER — LIDOCAINE HCL (PF) 2 % IJ SOLN
INTRAMUSCULAR | Status: AC
  Filled 2024-04-22: qty 5

## 2024-04-22 MED ORDER — ROCURONIUM BROMIDE 10 MG/ML (PF) SYRINGE
PREFILLED_SYRINGE | INTRAVENOUS | Status: DC | PRN
  Administered 2024-04-22: 70 mg via INTRAVENOUS

## 2024-04-22 MED ORDER — OXYCODONE HCL 5 MG PO TABS: 5.0000 mg | ORAL_TABLET | Freq: Four times a day (QID) | ORAL | 0 refills | Status: DC | PRN

## 2024-04-22 MED ORDER — CHLORHEXIDINE GLUCONATE 0.12 % MT SOLN
OROMUCOSAL | Status: AC
Start: 2024-04-22 — End: 2024-04-22
  Filled 2024-04-22: qty 15

## 2024-04-22 MED ORDER — LIDOCAINE HCL (PF) 2 % IJ SOLN
INTRAMUSCULAR | Status: DC | PRN
  Administered 2024-04-22: 100 mg via INTRADERMAL

## 2024-04-22 MED ORDER — SUGAMMADEX SODIUM 200 MG/2ML IV SOLN
INTRAVENOUS | Status: DC | PRN
  Administered 2024-04-22 (×3): 50 mg via INTRAVENOUS

## 2024-04-22 MED ORDER — BUPIVACAINE-EPINEPHRINE (PF) 0.5% -1:200000 IJ SOLN
INTRAMUSCULAR | Status: DC | PRN
  Administered 2024-04-22: 10 mL
  Administered 2024-04-22: 20 mL

## 2024-04-22 MED ORDER — FENTANYL CITRATE (PF) 100 MCG/2ML IJ SOLN
INTRAMUSCULAR | Status: DC | PRN
  Administered 2024-04-22 (×3): 50 ug via INTRAVENOUS

## 2024-04-22 MED ORDER — ACETAMINOPHEN 10 MG/ML IV SOLN
INTRAVENOUS | Status: AC
  Filled 2024-04-22: qty 100

## 2024-04-22 MED ORDER — DEXMEDETOMIDINE HCL IN NACL 80 MCG/20ML IV SOLN
INTRAVENOUS | Status: DC | PRN
  Administered 2024-04-22: 8 ug via INTRAVENOUS
  Administered 2024-04-22: 4 ug via INTRAVENOUS
  Administered 2024-04-22: 8 ug via INTRAVENOUS

## 2024-04-22 MED ORDER — BUPIVACAINE-EPINEPHRINE (PF) 0.5% -1:200000 IJ SOLN
INTRAMUSCULAR | Status: AC
  Filled 2024-04-22: qty 20

## 2024-04-22 MED ORDER — PHENYLEPHRINE 80 MCG/ML (10ML) SYRINGE FOR IV PUSH (FOR BLOOD PRESSURE SUPPORT)
PREFILLED_SYRINGE | INTRAVENOUS | Status: DC | PRN
  Administered 2024-04-22: 80 ug via INTRAVENOUS

## 2024-04-22 MED ORDER — DEXAMETHASONE SODIUM PHOSPHATE 10 MG/ML IJ SOLN
INTRAMUSCULAR | Status: DC | PRN
  Administered 2024-04-22: 10 mg via INTRAVENOUS

## 2024-04-22 MED ORDER — OXYCODONE HCL 5 MG/5ML PO SOLN: 5.0000 mg | Freq: Once | ORAL | Status: AC | PRN

## 2024-04-22 MED ORDER — INDOCYANINE GREEN 25 MG IV SOLR
INTRAVENOUS | Status: AC
  Filled 2024-04-22: qty 10

## 2024-04-22 MED ORDER — GLYCOPYRROLATE 0.2 MG/ML IJ SOLN
INTRAMUSCULAR | Status: AC
  Filled 2024-04-22: qty 1

## 2024-04-22 MED ORDER — GLYCOPYRROLATE 0.2 MG/ML IJ SOLN
INTRAMUSCULAR | Status: DC | PRN
  Administered 2024-04-22: .2 mg via INTRAVENOUS

## 2024-04-22 MED ORDER — OXYCODONE HCL 5 MG PO TABS
5.0000 mg | ORAL_TABLET | Freq: Once | ORAL | Status: AC | PRN
  Administered 2024-04-22: 5 mg via ORAL

## 2024-04-22 MED ORDER — BUPIVACAINE-EPINEPHRINE (PF) 0.5% -1:200000 IJ SOLN
INTRAMUSCULAR | Status: AC
  Filled 2024-04-22: qty 10

## 2024-04-22 MED ORDER — ONDANSETRON HCL 4 MG/2ML IJ SOLN: 4.0000 mg | Freq: Once | INTRAMUSCULAR | Status: DC | PRN

## 2024-04-22 MED ORDER — ACETAMINOPHEN 10 MG/ML IV SOLN
INTRAVENOUS | Status: DC | PRN
  Administered 2024-04-22: 1000 mg via INTRAVENOUS

## 2024-04-22 MED ORDER — SODIUM CHLORIDE 0.9 % IV SOLN
INTRAVENOUS | Status: AC
  Filled 2024-04-22: qty 2

## 2024-04-22 MED ORDER — PROPOFOL 10 MG/ML IV BOLUS
INTRAVENOUS | Status: DC | PRN
Start: 2024-04-22 — End: 2024-04-22
  Administered 2024-04-22: 100 mg via INTRAVENOUS
  Administered 2024-04-22: 50 mg via INTRAVENOUS
  Administered 2024-04-22: 30 mg via INTRAVENOUS

## 2024-04-22 MED ORDER — IPRATROPIUM-ALBUTEROL 0.5-2.5 (3) MG/3ML IN SOLN
RESPIRATORY_TRACT | Status: AC
  Filled 2024-04-22: qty 3

## 2024-04-22 MED ORDER — IPRATROPIUM-ALBUTEROL 0.5-2.5 (3) MG/3ML IN SOLN
3.0000 mL | Freq: Once | RESPIRATORY_TRACT | Status: AC | PRN
  Administered 2024-04-22: 3 mL via RESPIRATORY_TRACT

## 2024-04-22 MED ORDER — OXYCODONE HCL 5 MG PO TABS
ORAL_TABLET | ORAL | Status: AC
  Filled 2024-04-22: qty 1

## 2024-04-22 MED ORDER — 0.9 % SODIUM CHLORIDE (POUR BTL) OPTIME
TOPICAL | Status: DC | PRN
  Administered 2024-04-22: 500 mL

## 2024-04-22 MED ORDER — DEXAMETHASONE SODIUM PHOSPHATE 10 MG/ML IJ SOLN
INTRAMUSCULAR | Status: AC
  Filled 2024-04-22: qty 1

## 2024-04-22 MED ORDER — LACTATED RINGERS IV SOLN: INTRAVENOUS | Status: DC

## 2024-04-22 MED ORDER — ALBUTEROL SULFATE HFA 108 (90 BASE) MCG/ACT IN AERS
INHALATION_SPRAY | RESPIRATORY_TRACT | Status: AC
  Filled 2024-04-22: qty 6.7

## 2024-04-22 SURGICAL SUPPLY — 40 items
BAG PRESSURE INF REUSE 1000 (BAG) IMPLANT
CANNULA REDUCER 12-8 DVNC XI (CANNULA) ×1 IMPLANT
CAUTERY HOOK MNPLR 1.6 DVNC XI (INSTRUMENTS) ×1 IMPLANT
CLIP LIGATING HEMO O LOK GREEN (MISCELLANEOUS) ×1 IMPLANT
COVER TIP SHEARS 8 DVNC (MISCELLANEOUS) IMPLANT
DEFOGGER SCOPE WARM SEASHARP (MISCELLANEOUS) ×1 IMPLANT
DERMABOND ADVANCED .7 DNX12 (GAUZE/BANDAGES/DRESSINGS) ×1 IMPLANT
DRAPE ARM DVNC X/XI (DISPOSABLE) ×4 IMPLANT
DRAPE COLUMN DVNC XI (DISPOSABLE) ×1 IMPLANT
ELECTRODE REM PT RTRN 9FT ADLT (ELECTROSURGICAL) ×1 IMPLANT
FORCEPS BPLR 8 MD DVNC XI (FORCEP) IMPLANT
FORCEPS BPLR R/ABLATION 8 DVNC (INSTRUMENTS) ×1 IMPLANT
FORCEPS PROGRASP DVNC XI (FORCEP) ×1 IMPLANT
GLOVE BIOGEL PI IND STRL 7.5 (GLOVE) ×2 IMPLANT
GLOVE SURG SYN 7.0 PF PI (GLOVE) ×2 IMPLANT
GOWN STRL REUS W/ TWL LRG LVL3 (GOWN DISPOSABLE) ×3 IMPLANT
GRASPER SUT TROCAR 14GX15 (MISCELLANEOUS) ×1 IMPLANT
IRRIGATOR SUCT 8 DISP DVNC XI (IRRIGATION / IRRIGATOR) IMPLANT
IV NS 1000ML BAXH (IV SOLUTION) IMPLANT
IV NS 250ML BAXH (IV SOLUTION) IMPLANT
KIT PINK PAD W/HEAD ARM REST (MISCELLANEOUS) ×1 IMPLANT
LABEL OR SOLS (LABEL) ×1 IMPLANT
MANIFOLD NEPTUNE II (INSTRUMENTS) ×1 IMPLANT
NDL HYPO 22X1.5 SAFETY MO (MISCELLANEOUS) ×1 IMPLANT
NDL INSUFFLATION 14GA 120MM (NEEDLE) ×1 IMPLANT
NEEDLE HYPO 22X1.5 SAFETY MO (MISCELLANEOUS) ×1 IMPLANT
NEEDLE INSUFFLATION 14GA 120MM (NEEDLE) ×1 IMPLANT
NS IRRIG 500ML POUR BTL (IV SOLUTION) ×1 IMPLANT
OBTURATOR OPTICALSTD 8 DVNC (TROCAR) ×1 IMPLANT
PACK LAP CHOLECYSTECTOMY (MISCELLANEOUS) ×1 IMPLANT
PremierPro 500 ml Disposable Pressure Infusion Bag IMPLANT
SEAL UNIV 5-12 XI (MISCELLANEOUS) ×4 IMPLANT
SET TUBE SMOKE EVAC HIGH FLOW (TUBING) ×1 IMPLANT
SOLUTION ELECTROSURG ANTI STCK (MISCELLANEOUS) ×1 IMPLANT
SPIKE FLUID TRANSFER (MISCELLANEOUS) ×2 IMPLANT
SPONGE T-LAP 4X18 ~~LOC~~+RFID (SPONGE) IMPLANT
SUT VICRYL 0 UR6 27IN ABS (SUTURE) ×1 IMPLANT
SUTURE MNCRL 4-0 27XMF (SUTURE) ×1 IMPLANT
SYSTEM BAG RETRIEVAL 10MM (BASKET) ×1 IMPLANT
WATER STERILE IRR 500ML POUR (IV SOLUTION) ×1 IMPLANT

## 2024-04-22 NOTE — Anesthesia Postprocedure Evaluation (Signed)
 Anesthesia Post Note  Patient: Jamie Patterson  Procedure(s) Performed: CHOLECYSTECTOMY, ROBOT-ASSISTED, LAPAROSCOPIC (Abdomen)  Patient location during evaluation: PACU Anesthesia Type: General Level of consciousness: awake and alert, oriented and patient cooperative Pain management: pain level controlled Vital Signs Assessment: post-procedure vital signs reviewed and stable Respiratory status: spontaneous breathing, nonlabored ventilation and respiratory function stable Cardiovascular status: blood pressure returned to baseline and stable Postop Assessment: adequate PO intake Anesthetic complications: no   No notable events documented.   Last Vitals:  Vitals:   04/22/24 0935 04/22/24 1000  BP: (!) 152/98 137/89  Pulse: 89 87  Resp: 20 14  Temp: (!) 36.3 C   SpO2: 100% 100%    Last Pain:  Vitals:   04/22/24 1024  TempSrc:   PainSc: 6                  Tiersa Dayley

## 2024-04-22 NOTE — Discharge Instructions (Addendum)
AMBULATORY SURGERY  DISCHARGE INSTRUCTIONS   The drugs that you were given will stay in your system until tomorrow so for the next 24 hours you should not:  Drive an automobile Make any legal decisions Drink any alcoholic beverage   You may resume regular meals tomorrow.  Today it is better to start with liquids and gradually work up to solid foods.  You may eat anything you prefer, but it is better to start with liquids, then soup and crackers, and gradually work up to solid foods.   Please notify your doctor immediately if you have any unusual bleeding, trouble breathing, redness and pain at the surgery site, drainage, fever, or pain not relieved by medication.       Please contact your physician with any problems or Same Day Surgery at [phone], Monday through Friday 6 am to 4 pm, or [place] at [facility] number at [phone].

## 2024-04-22 NOTE — H&P (Signed)
 No changes to below H and P, patient has seen pulmonologist and had risk stratification. Proceed below as planned  CC: Biliary Colic History of Present Illness Jamie Patterson is a 63 y.o. female with past medical history as below who presents in consultation for biliary colic.  The patient was recently admitted to the hospital for pneumonia.  At that time she also underwent a CT scan that showed gallstones.  She was treated for her pneumonia because she continued to have right upper quadrant pain.  She says that she has postprandial right upper quadrant pain associated with some nausea.  She has an ultrasound that I personally reviewed that shows stones within the gallbladder lumen.   Past Medical History     Past Medical History:  Diagnosis Date   Amblyopia     ARTHRITIS, TRAUMATIC, ANKLE 08/07/2009    Trimalleolar fracture on right s/p fusion     Benign essential tremor      worsened by anxiety   Chronic pain due to injury      at R ankle due to ankle fracture   Depression      followed by psych   GERD (gastroesophageal reflux disease)     Hemoptysis 03/03/2022   History of hypertension     HLD (hyperlipidemia)     Imbalance      likely multifactorial (s/p balance training at [facility] 08/2010) ?CMT   Influenza A 09/15/2018   Iron deficiency anemia     LLL pneumonia 10/05/2018   Right ankle pain 2003    trimalleolar fx s/p fusion   Seizure (HCC)      focal, ex vacuo ventriculomegaly?, pending neuro w/u with sleep deprived EEG   Syncope and collapse 08/19/2011                 Past Surgical History:  Procedure Laterality Date   ANKLE FUSION   02/2004    Right-50% disability    BRONCHIAL WASHINGS   03/04/2022    Procedure: BRONCHIAL WASHINGS;  Surgeon: Geronimo Amel, MD;  Location: [facility];  Service: Endoscopy;;   CARPAL TUNNEL RELEASE   12/2017    L CTR repair, R thumb A1 pulley release (Gramig)   CESAREAN SECTION       HAMMER TOE SURGERY   09/12/2019   hospitalization    08/2011    syncope thought 2/2 seizure, MRI - Hyperintensity in the cerebral white matter bilaterally is nonspecific, started on Keppra    ORIF ANKLE FRACTURE   06/2003    Right   VIDEO BRONCHOSCOPY Bilateral 03/04/2022    Procedure: VIDEO BRONCHOSCOPY WITHOUT FLUORO;  Surgeon: Geronimo Amel, MD;  Location: [facility];  Service: Endoscopy;  Laterality: Bilateral;          Allergies       Allergies  Allergen Reactions   Amlodipine  Swelling      Ankle edema              Current Outpatient Medications  Medication Sig Dispense Refill   albuterol  (VENTOLIN  HFA) 108 (90 Base) MCG/ACT inhaler INHALE TWO PUFFS INTO THE LUNGS EVERY SIX HOURS AS NEEDED FOR WHEEZING OR SHORTNESS OF BREATH. 8.5 g 0   Ascorbic Acid (VITAMIN C) 1000 MG tablet Take 1,000 mg by mouth daily.       carvedilol  (COREG ) 25 MG tablet Take 1 tablet (25 mg total) by mouth 2 (two) times daily with a meal. 180 tablet 3   cholecalciferol  (VITAMIN D3) 25 MCG (1000 UNIT) tablet Take  1,000 Units by mouth daily.       cycloSPORINE  (RESTASIS ) 0.05 % ophthalmic emulsion Place 1 drop into both eyes 2 (two) times daily.       DULoxetine  (CYMBALTA ) 60 MG capsule Take 60 mg by mouth 2 (two) times daily.        escitalopram  (LEXAPRO ) 10 MG tablet Take 10 mg by mouth daily.        gabapentin  (NEURONTIN ) 300 MG capsule Take 300-1,200 mg by mouth See admin instructions. Take 1 capsule four times a day, then take 4 capsules at bedtime       ibuprofen (ADVIL) 200 MG tablet Take 200-800 mg by mouth every 6 (six) hours as needed for moderate pain (pain score 4-6).       lamoTRIgine  (LAMICTAL ) 150 MG tablet TAKE 1 TABLET BY MOUTH TWICE A DAY 180 tablet 1   levothyroxine  (SYNTHROID ) 50 MCG tablet TAKE ONE TABLET BY MOUTH ONCE A DAY. TAKE ON AN EMPTY STOMACH WITH A GLASS OF WATER ATLEAST 30-60 MIN BEFORE BREAKFAST 90 tablet 3   MAGNESIUM PO Take 400 mg by mouth 3 (three) times daily.        Melatonin 10 MG TABS Take 2 tablets by mouth at  bedtime.        Multiple Vitamin (MULTIVITAMIN) tablet Take 1 tablet by mouth daily.       mupirocin  ointment (BACTROBAN ) 2 % Apply 1 Application topically 2 (two) times daily. Apply pea sized amount to each nare with qtip twice daily for 5 days. 10 g 0   olmesartan  (BENICAR ) 40 MG tablet Take 1 tablet (40 mg total) by mouth daily. for blood pressure. 90 tablet 2   primidone  (MYSOLINE ) 50 MG tablet Take 1 tablet (50 mg total) by mouth 2 (two) times daily. 180 tablet 3   rosuvastatin  (CRESTOR ) 40 MG tablet Take 1 tablet (40 mg total) by mouth daily. 90 tablet 3   Tiotropium Bromide-Olodaterol (STIOLTO RESPIMAT ) 2.5-2.5 MCG/ACT AERS Inhale 2 puffs into the lungs daily. 1 each 5   tiZANidine  (ZANAFLEX ) 4 MG tablet Take 8 mg by mouth at bedtime.       vitamin B-12 (CYANOCOBALAMIN ) 1000 MCG tablet Take 1,000 mcg by mouth daily.          No current facility-administered medications for this visit.        Family History      Family History  Problem Relation Age of Onset   Depression Mother     Arrhythmia Mother 23        Dx with VTach after having sudden collapse, did NOT have h/o AMI's    Atrial fibrillation Father     Hypertension Father     Osteoarthritis Father     Aortic aneurysm Father     Hepatitis Brother          Hep. C   Anemia Sister              Social History Social History  Social History         Tobacco Use   Smoking status: Former      Current packs/day: 0.00      Average packs/day: 1 pack/day for 20.0 years (20.0 ttl pk-yrs)      Types: Cigarettes      Start date: 08/12/1975      Quit date: 08/12/1995      Years since quitting: 28.6   Smokeless tobacco: Never  Vaping Use   Vaping status: Never Used  Substance  Use Topics   Alcohol use: No      Alcohol/week: 0.0 standard drinks of alcohol      Comment: occassinal   Drug use: No            ROS Full ROS of systems performed and is otherwise negative there than what is stated in the HPI   Physical  Exam Blood pressure 135/79, pulse 88, height 5' 5 (1.651 m), weight 225 lb 9.6 oz (102.3 kg), last menstrual period 06/01/2013, SpO2 96%.   Alert and oriented x 3, normal breathing on room air, regular rate and rhythm, abdomen soft, non distended, mild tenderness in the right upper quadrant without a Murphy sign.   Data Reviewed Reviewed her ultrasound that showed evidence of gallstones.   I have personally reviewed the patient's imaging and medical records.     Assessment/Plan Assessment Patient with biliary colic.  I discussed the recommendation of robotic assisted cholecystectomy.  Given that she just had pneumonia we will get a repeat chest x-ray on 8 September and she is seeing her pulmonologist.  We will have them perform pulmonology restratification if she is at acceptable risk for surgery we will proceed.  I discussed the risk, benefits alternatives of robotic assisted cholecystectomy including risk of infection, bleeding, retained stone, bile leak and injury to the common bile duct.  She understands these risks and wishes to proceed with surgery     Jayson MALVA Endow

## 2024-04-22 NOTE — Anesthesia Procedure Notes (Signed)
 Procedure Name: Intubation Date/Time: 04/22/2024 7:42 AM  Performed by: Antonetta Ronnald Caldron, CRNAPre-anesthesia Checklist: Patient identified, Emergency Drugs available, Suction available and Patient being monitored Patient Re-evaluated:Patient Re-evaluated prior to induction Oxygen Delivery Method: Circle system utilized Preoxygenation: Pre-oxygenation with 100% oxygen Induction Type: IV induction Ventilation: Mask ventilation without difficulty and Oral airway inserted - appropriate to patient size Laryngoscope Size: Mac and 3 Grade View: Grade I Tube type: Oral Tube size: 7.0 mm Number of attempts: 1 Placement Confirmation: ETT inserted through vocal cords under direct vision, positive ETCO2 and breath sounds checked- equal and bilateral Secured at: 23 cm Tube secured with: Tape Dental Injury: Teeth and Oropharynx as per pre-operative assessment

## 2024-04-22 NOTE — Op Note (Signed)
 Robotic assisted laparoscopic Cholecystectomy  Pre-operative Diagnosis: Biliary Colic  Post-operative Diagnosis: Same  Procedure:  Robotic assisted laparoscopic Cholecystectomy  Surgeon: Jayson Endow, MD  Anesthesia: Gen. with endotracheal tube  Findings: Mildkly inflamed but well vascularized gallbladder, critical view of safety obtained  Estimated Blood Loss: cc       Specimens: Gallbladder           Complications: none   Procedure Details  The patient was seen again in the Holding Room. The benefits, complications, treatment options, and expected outcomes were discussed with the patient. The risks of bleeding, infection, recurrence of symptoms, failure to resolve symptoms, bile duct damage, bile duct leak, retained common bile duct stone, bowel injury, any of which could require further surgery and/or ERCP, stent, or papillotomy were reviewed with the patient. The likelihood of improving the patient's symptoms with return to their baseline status is good.  The patient and/or family concurred with the proposed plan, giving informed consent.  The patient was taken to Operating Room, identified  and the procedure verified as robotic Cholecystectomy.  A Time Out was held and the above information confirmed.  Prior to the induction of general anesthesia, antibiotic prophylaxis was administered. VTE prophylaxis was in place. General endotracheal anesthesia was then administered and tolerated well. After the induction, the abdomen was prepped with Chloraprep and draped in the sterile fashion. The patient was positioned in the supine position.  A veress needle was inserted into the abdomen using standard drop technique. An 8mm infra-umbilical robotic port was then placed under direct visualization. There was no injury noted at the site of veress needle insertion. Two right sided abdominal 8mm ports followed by an 8mm left abdominal robotic ports were placed under direct visualization. The  left sided abdominal port was then upsized to a 12mm robotic port.  The patient was positioned  in reverse Trendelenburg, robot was brought to the surgical field and docked in the standard fashion.  We made sure all the instrumentation was kept indirect view at all times and that there were no collision between the arms. I scrubbed out and went to the console.  The gallbladder was identified, the fundus grasped and retracted cephalad. Adhesions were lysed bluntly. The infundibulum was grasped and retracted laterally, exposing the peritoneum overlying the triangle of Calot. This was then divided and exposed in a blunt fashion. An extended critical view of the cystic duct and cystic artery was obtained.  The cystic duct was clearly identified and bluntly dissected.   Artery and duct were double clipped and divided. Using ICG cholangiography we visualized the cystic duct. The gallbladder was taken from the gallbladder fossa in a retrograde fashion with the electrocautery.  Hemostasis was achieved with the electrocautery. nspection of the right upper quadrant was performed. No bleeding, bile duct injury or leak, or bowel injury was noted. Robotic instruments and robotic arms were undocked in the standard fashion.  I scrubbed back in.  The gallbladder was removed and placed in an Endocatch bag.   The left lower quadrant fascia was then closed with a 0 vicryl using a suture needle passer. The pre-peritoneal space was then infiltrated with liposomal bupivicaine and marcaine  solution. Pneumoperitoneum was released.  4-0 subcuticular Monocryl was used to close the skin. Dermabond was  applied.  The patient was then extubated and brought to the recovery room in stable condition. Sponge, lap, and needle counts were correct at closure and at the conclusion of the case.  Jayson Endow, M.D. [facility]

## 2024-04-22 NOTE — Transfer of Care (Signed)
 Immediate Anesthesia Transfer of Care Note  Patient: Jamie Patterson  Procedure(s) Performed: CHOLECYSTECTOMY, ROBOT-ASSISTED, LAPAROSCOPIC (Abdomen)  Patient Location: PACU  Anesthesia Type:General  Level of Consciousness: alert , drowsy, patient cooperative, and responds to stimulation  Airway & Oxygen Therapy: Patient Spontanous Breathing and Patient connected to face mask oxygen  Post-op Assessment: Report given to RN and Post -op Vital signs reviewed and stable  Post vital signs: Reviewed and stable  Last Vitals:  Vitals Value Taken Time  BP 152/98 04/22/24 09:34  Temp    Pulse 88 04/22/24 09:38  Resp 22 04/22/24 09:38  SpO2 99 % 04/22/24 09:38  Vitals shown include unfiled device data.  Last Pain:  Vitals:   04/22/24 0626  TempSrc: Temporal  PainSc: 0-No pain         Complications: No notable events documented.

## 2024-04-22 NOTE — Anesthesia Preprocedure Evaluation (Addendum)
 Anesthesia Evaluation  Patient identified by MRN, date of birth, ID band Patient awake    Reviewed: Allergy  & Precautions, NPO status , Patient's Chart, lab work & pertinent test results  History of Anesthesia Complications Negative for: history of anesthetic complications  Airway Mallampati: I   Neck ROM: Full    Dental   Crowns :   Pulmonary pneumonia (recurrent; most recently MRSA PNA 02/2024), former smoker (quit 1997)   Pulmonary exam normal breath sounds clear to auscultation       Cardiovascular hypertension, Normal cardiovascular exam Rhythm:Regular Rate:Normal  ECG 03/02/24:  Normal sinus rhythm Nonspecific T wave abnormality Abnormal ECG When compared with ECG of 02-Mar-2024 11:42, No significant change was found   Neuro/Psych Seizures - (last sz 2013),  PSYCHIATRIC DISORDERS  Depression       GI/Hepatic ,GERD  ,,  Endo/Other  Hypothyroidism  Obesity   Renal/GU      Musculoskeletal   Abdominal   Peds  Hematology  (+) Blood dyscrasia, anemia   Anesthesia Other Findings Pulmonology note 04/18/24:  Plan/Recommendations:   Glad you are feeling better.  Chest xray shows resolving pneumonia.  You can stop the stiolto if not helping.  Continue albuterol  inhaler as needed.  Good luck with the gall bladder surgery! Call and see us  sooner if breathing issues.    Jamie Patterson has a low risk of post-operative pulmonary complications by ARISCAT Index.  The absolute assessment of risk/benefit of the procedure is deferred to the primary team's evaluation.   RECOMMENDATIONS:  In order to minimize the risk of complications and optimize pulmonary status, we recommend the following: - Encourage aggressive incentive spirometry hourly both peri-operatively and post-operatively as tolerated  - Early ambulation and physical therapy as tolerated post-operatively - Adequate pain control especially in the setting of abdominal  and thoracic surgery - Bronchodilators as needed for wheezing or shortness of breath - Intraoperatively keep OR time to the shortest as possible   Preoperative Risk Calculation: Postoperative respiratory failure (PRF) is considered as failure to wean from mechanical ventilation within 48 hours of surgery or unplanned intubation/reintubation postoperatively. The validated risk calculator provides a risk estimate of PRF and is anticipated to aid in surgical decision-making and informed patient consent.  However risk can be accepted given the potential benefit of this intervention and it is not prohibitive.   The features of this patient's history that contribute to the pulmonary risk assessment include: Age, General anesthesia   0 to 25 points: Low risk: 1.6% pulmonary complication rate  26 to 44 points: Intermediate risk: 13.3% pulmonary complication rate  45 to 123 points: High risk: 42.1% pulmonary complication rate   ARISCAT: Mazo et al. Anesthesiology 2014; [phone]     Return to Care: No follow-ups on file.    Reproductive/Obstetrics                              Anesthesia Physical Anesthesia Plan  ASA: 3  Anesthesia Plan: General   Post-op Pain Management:    Induction: Intravenous  PONV Risk Score and Plan: 3 and Ondansetron , Dexamethasone  and Treatment may vary due to age or medical condition  Airway Management Planned: Oral ETT  Additional Equipment:   Intra-op Plan:   Post-operative Plan: Possible Post-op intubation/ventilation and Extubation in OR  Informed Consent: I have reviewed the patients History and Physical, chart, labs and discussed the procedure including the risks, benefits and alternatives for the proposed  anesthesia with the patient or authorized representative who has indicated his/her understanding and acceptance.     Dental advisory given  Plan Discussed with: CRNA  Anesthesia Plan Comments: (Patient consented for  risks of anesthesia including but not limited to:  - adverse reactions to medications - damage to eyes, teeth, lips or other oral mucosa - nerve damage due to positioning  - sore throat or hoarseness - damage to heart, brain, nerves, lungs, other parts of body or loss of life  Informed patient about role of CRNA in peri- and intra-operative care.  Patient voiced understanding.)         Anesthesia Quick Evaluation

## 2024-04-24 ENCOUNTER — Other Ambulatory Visit: Payer: Self-pay | Admitting: Primary Care

## 2024-04-24 ENCOUNTER — Ambulatory Visit: Payer: Self-pay | Admitting: Primary Care

## 2024-04-24 DIAGNOSIS — J189 Pneumonia, unspecified organism: Secondary | ICD-10-CM

## 2024-04-25 LAB — SURGICAL PATHOLOGY

## 2024-04-26 ENCOUNTER — Ambulatory Visit: Admission: RE | Admit: 2024-04-26 | Source: Ambulatory Visit

## 2024-04-26 ENCOUNTER — Other Ambulatory Visit: Payer: Self-pay | Admitting: Primary Care

## 2024-04-26 DIAGNOSIS — J189 Pneumonia, unspecified organism: Secondary | ICD-10-CM

## 2024-04-30 ENCOUNTER — Other Ambulatory Visit: Payer: Self-pay | Admitting: Primary Care

## 2024-04-30 DIAGNOSIS — I10 Essential (primary) hypertension: Secondary | ICD-10-CM

## 2024-04-30 DIAGNOSIS — E039 Hypothyroidism, unspecified: Secondary | ICD-10-CM

## 2024-04-30 NOTE — Telephone Encounter (Signed)
 Can we get her scheduled for CPE in October?  Can we schedule her at the end of a morning or afternoon session?  Thanks!

## 2024-05-03 NOTE — Telephone Encounter (Signed)
Lvm and sent MyChart

## 2024-05-04 ENCOUNTER — Encounter: Payer: Self-pay | Admitting: Physician Assistant

## 2024-05-04 ENCOUNTER — Ambulatory Visit (INDEPENDENT_AMBULATORY_CARE_PROVIDER_SITE_OTHER): Admitting: Physician Assistant

## 2024-05-04 VITALS — BP 159/107 | HR 87 | Temp 97.9°F | Ht 65.0 in | Wt 223.0 lb

## 2024-05-04 DIAGNOSIS — K8050 Calculus of bile duct without cholangitis or cholecystitis without obstruction: Secondary | ICD-10-CM

## 2024-05-04 DIAGNOSIS — Z09 Encounter for follow-up examination after completed treatment for conditions other than malignant neoplasm: Secondary | ICD-10-CM

## 2024-05-04 NOTE — Progress Notes (Signed)
 [facility] POST-OP OFFICE VISIT  05/04/2024  HPI: Jamie Patterson is a 63 y.o. female 12 days s/p robotic assisted laparoscopic cholecystectomy for biliary colic with Dr Marinda  She is doing well She reports her biggest issues remain centered around her pneumonia history; due to follow up with pulmonology soon She denied any incisional soreness No fever, chills, nausea, emesis She is tolerating PO; no diarrhea No other complaints   Vital signs: BP (!) 159/107   Pulse 87   Temp 97.9 F (36.6 C) (Oral)   Ht 5' 5 (1.651 m)   Wt 223 lb (101.2 kg)   LMP 06/01/2013   SpO2 96%   BMI 37.11 kg/m    Physical Exam: Constitutional: Well appearing female, NAD Abdomen: Soft, non-tender, non-distended, no rebound/guarding Skin: Laparoscopic incisions are healing well, no erythema or drainage   Assessment/Plan: This is a 63 y.o. female 12 days s/p robotic assisted laparoscopic cholecystectomy for biliary colic with Dr Marinda   - Pain control prn  - Reviewed wound care recommendation  - Reviewed lifting restrictions; 4 weeks total  - Reviewed surgical pathology; CCC  - She can follow up on as needed basis; She understands to call with questions/concerns  -- [name], PA-C [facility] 05/04/2024, 2:55 PM M-F: 7am - 4pm

## 2024-05-04 NOTE — Patient Instructions (Signed)

## 2024-05-05 ENCOUNTER — Other Ambulatory Visit (HOSPITAL_COMMUNITY): Payer: Self-pay

## 2024-05-05 NOTE — Telephone Encounter (Signed)
 Lvm to schedule

## 2024-06-08 ENCOUNTER — Ambulatory Visit (INDEPENDENT_AMBULATORY_CARE_PROVIDER_SITE_OTHER)

## 2024-06-08 VITALS — BP 130/84 | Ht 65.0 in | Wt 223.4 lb

## 2024-06-08 DIAGNOSIS — Z0000 Encounter for general adult medical examination without abnormal findings: Secondary | ICD-10-CM

## 2024-06-08 DIAGNOSIS — Z23 Encounter for immunization: Secondary | ICD-10-CM

## 2024-06-08 NOTE — Patient Instructions (Addendum)
 Ms. Jamie Patterson,  Thank you for taking the time for your Medicare Wellness Visit. I appreciate your continued commitment to your health goals. Please review the care plan we discussed, and feel free to reach out if I can assist you further.  Medicare recommends these wellness visits once per year to help you and your care team stay ahead of potential health issues. These visits are designed to focus on prevention, allowing your provider to concentrate on managing your acute and chronic conditions during your regular appointments.  Please note that Annual Wellness Visits do not include a physical exam. Some assessments may be limited, especially if the visit was conducted virtually. If needed, we may recommend a separate in-person follow-up with your provider.  Ongoing Care Seeing your primary care provider every 3 to 6 months helps us  monitor your health and provide consistent, personalized care.   Referrals If a referral was made during today's visit and you haven't received any updates within two weeks, please contact the referred provider directly to check on the status.  Recommended Screenings:  Health Maintenance  Topic Date Due   Pneumococcal Vaccine for age over 7 (2 of 2 - PCV20 or PCV21) 05/03/2016   Zoster (Shingles) Vaccine (2 of 2) 06/08/2020   Pap with HPV screening  04/04/2022   Cologuard (Stool DNA test)  03/15/2023   Medicare Annual Wellness Visit  06/08/2025   Breast Cancer Screening  08/12/2025   Flu Shot  Completed   Hepatitis C Screening  Completed   HIV Screening  Completed   Hepatitis B Vaccine  Aged Out   HPV Vaccine  Aged Out   Meningitis B Vaccine  Aged Out   DTaP/Tdap/Td vaccine  Discontinued   COVID-19 Vaccine  Discontinued       06/08/2024   11:15 AM  Advanced Directives  Does Patient Have a Medical Advance Directive? Yes  Type of Estate Agent of [place];Living will  Copy of Healthcare Power of Attorney in Chart? Yes - validated  most recent copy scanned in chart (See row information)   Advance Care Planning is important because it: Ensures you receive medical care that aligns with your values, goals, and preferences. Provides guidance to your family and loved ones, reducing the emotional burden of decision-making during critical moments.  Vision: Annual vision screenings are recommended for early detection of glaucoma, cataracts, and diabetic retinopathy. These exams can also reveal signs of chronic conditions such as diabetes and high blood pressure.  Dental: Annual dental screenings help detect early signs of oral cancer, gum disease, and other conditions linked to overall health, including heart disease and diabetes.

## 2024-06-08 NOTE — Progress Notes (Signed)
 Please attest and cosign this visit due to patients primary care provider not being in the office at the time the visit was completed.     Subjective:   Jamie Patterson is a 63 y.o. who presents for a Medicare Wellness preventive visit.  As a reminder, Annual Wellness Visits don't include a physical exam, and some assessments may be limited, especially if this visit is performed virtually. We may recommend an in-person follow-up visit with your provider if needed.  Visit Complete: In person  Persons Participating in Visit: Patient.  AWV Questionnaire: Yes: Patient Medicare AWV questionnaire was completed by the patient on 06/08/24; I have confirmed that all information answered by patient is correct and no changes since this date.  Cardiac Risk Factors include: advanced age (>41men, >74 women);dyslipidemia;hypertension;obesity (BMI >30kg/m2);sedentary lifestyle     Objective:    Today's Vitals   06/08/24 1057  BP: 130/84  Weight: 223 lb 6.4 oz (101.3 kg)  Height: 5' 5 (1.651 m)   Body mass index is 37.18 kg/m.     06/08/2024   11:15 AM 04/22/2024    6:30 AM 04/15/2024    8:20 AM 04/14/2024    1:04 PM 02/28/2024    1:00 AM 02/26/2024    9:48 PM 04/14/2023    2:47 PM  Advanced Directives  Does Patient Have a Medical Advance Directive? Yes No No Yes No No No  Type of Estate Agent of [place];Living will   Living will     Copy of Healthcare Power of Attorney in Chart? Yes - validated most recent copy scanned in chart (See row information)        Would patient like information on creating a medical advance directive?  No - Patient declined No - Patient declined  No - Patient declined      Current Medications (verified) Outpatient Encounter Medications as of 06/08/2024  Medication Sig   albuterol  (VENTOLIN  HFA) 108 (90 Base) MCG/ACT inhaler INHALE TWO PUFFS INTO THE LUNGS EVERY SIX HOURS AS NEEDED FOR WHEEZING OR SHORTNESS OF BREATH.   Ascorbic Acid (VITAMIN C)  1000 MG tablet Take 1,000 mg by mouth daily.   carvedilol  (COREG ) 25 MG tablet Take 1 tablet (25 mg total) by mouth 2 (two) times daily with a meal.   cholecalciferol  (VITAMIN D3) 25 MCG (1000 UNIT) tablet Take 1,000 Units by mouth daily.   cycloSPORINE  (RESTASIS ) 0.05 % ophthalmic emulsion Place 1 drop into both eyes 2 (two) times daily.   DULoxetine  (CYMBALTA ) 60 MG capsule Take 60 mg by mouth 2 (two) times daily.    escitalopram  (LEXAPRO ) 10 MG tablet Take 10 mg by mouth daily.    gabapentin  (NEURONTIN ) 300 MG capsule Take 300-600 mg by mouth See admin instructions. Take 300 mg by mouth in the morning and afternoon and take 600 mg at night   ibuprofen (ADVIL) 200 MG tablet Take 200-800 mg by mouth every 6 (six) hours as needed for moderate pain (pain score 4-6).   lamoTRIgine  (LAMICTAL ) 150 MG tablet TAKE 1 TABLET BY MOUTH TWICE A DAY   levothyroxine  (SYNTHROID ) 50 MCG tablet TAKE ONE TABLET BY MOUTH ONCE A DAY. TAKE ON AN EMPTY STOMACH WITH A GLASS OF WATER ATLEAST 30-60 MIN BEFORE BREAKFAST   MAGNESIUM PO Take 400 mg by mouth 3 (three) times daily.    Melatonin 10 MG TABS Take 20 mg by mouth at bedtime.   Multiple Vitamin (MULTIVITAMIN) tablet Take 1 tablet by mouth daily.   olmesartan  (  BENICAR ) 40 MG tablet TAKE ONE TABLET (40 MG TOTAL) BY MOUTH DAILY. FOR BLOOD PRESSURE.   primidone  (MYSOLINE ) 50 MG tablet Take 1 tablet (50 mg total) by mouth 2 (two) times daily.   rosuvastatin  (CRESTOR ) 40 MG tablet Take 1 tablet (40 mg total) by mouth daily.   tiZANidine  (ZANAFLEX ) 4 MG tablet Take 8 mg by mouth at bedtime.   vitamin B-12 (CYANOCOBALAMIN ) 1000 MCG tablet Take 1,000 mcg by mouth daily.   oxyCODONE  (OXY IR/ROXICODONE ) 5 MG immediate release tablet Take 1 tablet (5 mg total) by mouth every 6 (six) hours as needed for severe pain (pain score 7-10).   Tiotropium Bromide-Olodaterol (STIOLTO RESPIMAT ) 2.5-2.5 MCG/ACT AERS Inhale 2 puffs into the lungs daily.   No facility-administered  encounter medications on file as of 06/08/2024.    Allergies (verified) Amlodipine    History: 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   Trimalleolar fracture on right s/p fusion     Benign essential tremor    worsened by anxiety   Chronic pain due to injury    at R ankle due to ankle fracture   Depression    followed by psych   GERD (gastroesophageal reflux disease)    Hemoptysis 03/03/2022   History of hypertension    HLD (hyperlipidemia)    Hypertension    Imbalance    likely multifactorial (s/p balance training at [facility] 08/2010) ?CMT   Influenza A 09/15/2018   Iron deficiency anemia    LLL pneumonia 10/05/2018   MRSA pneumonia (HCC) 02/2024   Pre-diabetes    Right ankle pain 2003   trimalleolar fx s/p fusion   Seizure (HCC)    focal, ex vacuo ventriculomegaly?, pending neuro w/u with sleep deprived EEG   Syncope and collapse 08/19/2011   Past Surgical History:  Procedure Laterality Date   ANKLE FUSION  02/2004   Right-50% disability    BRONCHIAL WASHINGS  03/04/2022   Procedure: BRONCHIAL WASHINGS;  Surgeon: Geronimo Amel, MD;  Location: [facility];  Service: Endoscopy;;   CARPAL TUNNEL RELEASE  12/2017   L CTR repair, R thumb A1 pulley release (Gramig)   CESAREAN SECTION     HAMMER TOE SURGERY  09/12/2019   hospitalization  08/2011   syncope thought 2/2 seizure, MRI - Hyperintensity in the cerebral white matter bilaterally is nonspecific, started on Keppra    ORIF ANKLE FRACTURE  06/2003   Right   VIDEO BRONCHOSCOPY Bilateral 03/04/2022   Procedure: VIDEO BRONCHOSCOPY WITHOUT FLUORO;  Surgeon: Geronimo Amel, MD;  Location: [facility];  Service: Endoscopy;  Laterality: Bilateral;   Family History  Problem Relation Age of Onset   Depression Mother    Arrhythmia Mother 32       Dx with VTach after having sudden collapse, did NOT have h/o AMI's    Atrial fibrillation Father    Hypertension Father     Osteoarthritis Father    Aortic aneurysm Father    Hepatitis Brother        Hep. C   Anemia Sister    Social History   Socioeconomic History   Marital status: Married    Spouse name: Not on file   Number of children: 1   Years of education: Not on file   Highest education level: Not on file  Occupational History   Occupation: Disabled    Comment: depression   Occupation: DISABILITY    Employer: UNEMPLOYED  Tobacco Use   Smoking status:  Former    Current packs/day: 0.00    Average packs/day: 1 pack/day for 20.0 years (20.0 ttl pk-yrs)    Types: Cigarettes    Start date: 08/12/1975    Quit date: 08/12/1995    Years since quitting: 28.8   Smokeless tobacco: Never  Vaping Use   Vaping status: Never Used  Substance and Sexual Activity   Alcohol use: No    Alcohol/week: 0.0 standard drinks of alcohol    Comment: occassinal   Drug use: No   Sexual activity: Not on file  Other Topics Concern   Not on file  Social History Narrative   No caffeine. Divorced. 1 child. 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 Has a cane but hasn't needed it recently, although endorses that she falls a lot    Social Drivers of Corporate Investment Banker Strain: Low Risk  (06/08/2024)   Overall Financial Resource Strain (CARDIA)    Difficulty of Paying Living Expenses: Not hard at all  Food Insecurity: No Food Insecurity (06/08/2024)   Hunger Vital Sign    Worried About Running Out of Food in the Last Year: Never true    Ran Out of Food in the Last Year: Never true  Transportation Needs: No Transportation Needs (06/08/2024)   PRAPARE - Administrator, Civil Service (Medical): No    Lack of Transportation (Non-Medical): No  Physical Activity: Inactive (06/08/2024)   Exercise Vital Sign    Days of Exercise per Week: 0 days    Minutes of Exercise per Session: 0 min  Stress: No Stress Concern Present (06/08/2024)   [employer] of Occupational Health -  Occupational Stress Questionnaire    Feeling of Stress: Only a little  Social Connections: Moderately Isolated (06/08/2024)   Social Connection and Isolation Panel    Frequency of Communication with Friends and Family: More than three times a week    Frequency of Social Gatherings with Friends and Family: More than three times a week    Attends Religious Services: Never    Database Administrator or Organizations: No    Attends Engineer, Structural: Never    Marital Status: Married    Tobacco Counseling Counseling given: Not Answered   Clinical Intake:  Pre-visit preparation completed: Yes  Pain : No/denies pain    BMI - recorded: 37.18 Nutritional Status: BMI > 30  Obese Nutritional Risks: None Diabetes: No  Lab Results  Component Value Date   HGBA1C 5.8 05/06/2023   HGBA1C 5.8 05/01/2022   HGBA1C 5.8 03/14/2021     How often do you need to have someone help you when you read instructions, pamphlets, or other written materials from your doctor or pharmacy?: 1 - Never  Interpreter Needed?: No  Comments: lives with husband Information entered by :: B.Ryshawn Sanzone,LPN   Activities of Daily Living     06/08/2024    8:14 AM 04/15/2024    8:22 AM  In your present state of health, do you have any difficulty performing the following activities:  Hearing? 0   Vision? 0   Difficulty concentrating or making decisions? 1   Walking or climbing stairs? 1   Dressing or bathing? 0   Doing errands, shopping? 0 0  Preparing Food and eating ? N   Using the Toilet? N   In the past six months, have you accidently leaked urine? N   Do you have problems with loss of bowel control? N   Managing  your Medications? N   Managing your Finances? N   Housekeeping or managing your Housekeeping? N     Patient Care Team: Gretta Comer POUR, NP as PCP - General (Internal Medicine) Delford Maude BROCKS, MD as PCP - Cardiology (Cardiology) Rilla Baller, MD Tat, [place] RAMAN, DO as  Consulting Physician (Neurology) Meade Verdon RAMAN, MD as Consulting Physician (Pulmonary Disease) Vincente Grip, MD as Consulting Physician (Psychiatry) [facility], [facility], [place], [place] (Optometry)  I have updated your Care Teams any recent Medical Services you may have received from other providers in the past year.     Assessment:   This is a routine wellness examination for Cherre.  Hearing/Vision screen Hearing Screening - Comments:: Patient denies any hearing difficulties.   Vision Screening - Comments:: Pt says their vision is good with glasses Dr  Portia   Goals Addressed             This Visit's Progress    Patient Stated   On track    06/08/24- I will maintain and continue medications as prescribed.      Patient Stated   Not on track    06/08/24-Stay healthy and  exercise more.       Depression Screen     06/08/2024   11:08 AM 03/10/2024    2:52 PM 07/02/2023    9:01 AM 03/25/2023    2:03 PM 05/01/2022   10:34 AM 10/29/2021   10:37 AM 03/14/2021   12:33 PM  PHQ 2/9 Scores  PHQ - 2 Score 2 0  0 5 2 3   PHQ- 9 Score 5 11   16 10 14   Exception Documentation   Patient refusal        Fall Risk     06/08/2024    8:14 AM 04/14/2024    1:03 PM 07/02/2023    9:01 AM 05/06/2023    2:03 PM 03/25/2023    2:12 PM  Fall Risk   Falls in the past year? 1 1 1 1 1   Number falls in past yr: 1 1 0 1 1  Injury with Fall? 1 0 1 1 0  Risk for fall due to : History of fall(s);Orthopedic patient;Impaired balance/gait  History of fall(s) History of fall(s) Impaired mobility;Impaired balance/gait  Risk for fall due to: Comment ankle surgery;uses cane sometimes      Follow up Education provided;Falls prevention discussed Falls evaluation completed Falls evaluation completed Falls evaluation completed Falls prevention discussed;Falls evaluation completed;Education provided    MEDICARE RISK AT HOME:  Medicare Risk at Home Any stairs in or around the home?:  (Patient-Rptd) Yes If so, are there any without handrails?: (Patient-Rptd) No Home free of loose throw rugs in walkways, pet beds, electrical cords, etc?: (Patient-Rptd) Yes Adequate lighting in your home to reduce risk of falls?: (Patient-Rptd) Yes Life alert?: (Patient-Rptd) No Use of a cane, walker or w/c?: (Patient-Rptd) Yes Grab bars in the bathroom?: (Patient-Rptd) No Shower chair or bench in shower?: (Patient-Rptd) No Elevated toilet seat or a handicapped toilet?: (Patient-Rptd) Yes  TIMED UP AND GO:  Was the test performed?  Yes  Length of time to ambulate 10 feet: 12 sec Gait steady and fast without use of assistive device  Cognitive Function: 6CIT completed    12/01/2019   10:40 AM  MMSE - Mini Mental State Exam  Orientation to time 5  Orientation to Place 5  Registration 3  Attention/ Calculation 5  Recall 3  Language- repeat 1  06/08/2024   11:17 AM 03/25/2023    2:14 PM  6CIT Screen  What Year? 0 points 0 points  What month? 0 points 0 points  What time? 0 points 0 points  Count back from 20 0 points 0 points  Months in reverse 0 points 0 points  Repeat phrase 0 points 0 points  Total Score 0 points 0 points    Immunizations Immunization History  Administered Date(s) Administered   Influenza Split 08/19/2012   Influenza, Seasonal, Injecte, Preservative Fre 05/06/2023, 06/08/2024   Influenza,inj,Quad PF,6+ Mos 05/04/2015, 09/04/2018, 04/07/2019, 08/14/2020, 05/01/2022   PFIZER(Purple Top)SARS-COV-2 Vaccination 10/31/2019, 11/21/2019   Pneumococcal Conjugate-13 05/04/2015   Tdap 08/28/2011   Zoster Recombinant(Shingrix) 04/13/2020    Screening Tests Health Maintenance  Topic Date Due   Pneumococcal Vaccine: 50+ Years (2 of 2 - PCV20 or PCV21) 05/03/2016   Zoster Vaccines- Shingrix (2 of 2) 06/08/2020   Cervical Cancer Screening (HPV/Pap Cotest)  04/04/2022   Fecal DNA (Cologuard)  03/15/2023   Medicare Annual Wellness (AWV)  06/08/2025    Mammogram  08/12/2025   Influenza Vaccine  Completed   Hepatitis C Screening  Completed   HIV Screening  Completed   Hepatitis B Vaccines 19-59 Average Risk  Aged Out   HPV VACCINES  Aged Out   Meningococcal B Vaccine  Aged Out   DTaP/Tdap/Td  Discontinued   COVID-19 Vaccine  Discontinued    Health Maintenance Items Addressed: Vaccines Given today: Influenza  Additional Screening:  Vision Screening: Recommended annual ophthalmology exams for early detection of glaucoma and other disorders of the eye. Is the patient up to date with their annual eye exam?  Yes  Who is the provider or what is the name of the office in which the patient attends annual eye exams? Dr Portia  Dental Screening: Recommended annual dental exams for proper oral hygiene  Community Resource Referral / Chronic Care Management: CRR required this visit?  No   CCM required this visit?  Appt scheduled with PCP   Plan:    I have personally reviewed and noted the following in the patient's chart:   Medical and social history Use of alcohol, tobacco or illicit drugs  Current medications and supplements including opioid prescriptions. Patient is not currently taking opioid prescriptions. Functional ability and status Nutritional status Physical activity Advanced directives List of other physicians Hospitalizations, surgeries, and ER visits in previous 12 months Vitals Screenings to include cognitive, depression, and falls Referrals and appointments  In addition, I have reviewed and discussed with patient certain preventive protocols, quality metrics, and best practice recommendations. A written personalized care plan for preventive services as well as general preventive health recommendations were provided to patient.   Erminio LITTIE Saris, LPN   [id number]   After Visit Summary: (MyChart) Due to this being a telephonic visit, the after visit summary with patients personalized plan was offered to  patient via MyChart   Notes: Nothing significant to report at this time.

## 2024-06-29 ENCOUNTER — Encounter: Admitting: Primary Care

## 2024-07-19 ENCOUNTER — Ambulatory Visit
Admission: RE | Admit: 2024-07-19 | Discharge: 2024-07-19 | Disposition: A | Source: Ambulatory Visit | Attending: Primary Care

## 2024-07-19 DIAGNOSIS — J189 Pneumonia, unspecified organism: Secondary | ICD-10-CM

## 2024-07-20 ENCOUNTER — Encounter: Payer: Self-pay | Admitting: Primary Care

## 2024-07-20 ENCOUNTER — Ambulatory Visit: Admitting: Primary Care

## 2024-07-20 VITALS — BP 124/76 | HR 83 | Temp 98.4°F | Ht 64.25 in | Wt 224.1 lb

## 2024-07-20 DIAGNOSIS — R7303 Prediabetes: Secondary | ICD-10-CM

## 2024-07-20 DIAGNOSIS — Z0000 Encounter for general adult medical examination without abnormal findings: Secondary | ICD-10-CM

## 2024-07-20 DIAGNOSIS — F331 Major depressive disorder, recurrent, moderate: Secondary | ICD-10-CM

## 2024-07-20 DIAGNOSIS — E7800 Pure hypercholesterolemia, unspecified: Secondary | ICD-10-CM

## 2024-07-20 DIAGNOSIS — R251 Tremor, unspecified: Secondary | ICD-10-CM

## 2024-07-20 DIAGNOSIS — I10 Essential (primary) hypertension: Secondary | ICD-10-CM

## 2024-07-20 DIAGNOSIS — E039 Hypothyroidism, unspecified: Secondary | ICD-10-CM

## 2024-07-20 DIAGNOSIS — Z23 Encounter for immunization: Secondary | ICD-10-CM

## 2024-07-20 DIAGNOSIS — R053 Chronic cough: Secondary | ICD-10-CM

## 2024-07-20 DIAGNOSIS — Z1211 Encounter for screening for malignant neoplasm of colon: Secondary | ICD-10-CM

## 2024-07-20 LAB — HEMOGLOBIN A1C: Hgb A1c MFr Bld: 6 % (ref 4.6–6.5)

## 2024-07-20 LAB — LIPID PANEL
Cholesterol: 156 mg/dL (ref 0–200)
HDL: 71.1 mg/dL (ref >39.00)
LDL Cholesterol: 67 mg/dL (ref 0–99)
NonHDL: 84.75
Total CHOL/HDL Ratio: 2
Triglycerides: 87 mg/dL (ref 0.0–149.0)
VLDL: 17.4 mg/dL (ref 0.0–40.0)

## 2024-07-20 LAB — TSH: TSH: 4.34 u[IU]/mL (ref 0.35–5.50)

## 2024-07-20 NOTE — Assessment & Plan Note (Signed)
 Controlled.  Continue carvedilol  25 mg BID, olmesartan  40 mg daily.  Reviewed BMP from July 2025

## 2024-07-20 NOTE — Assessment & Plan Note (Signed)
 Prevnar 20 provided today. Discussed second Shingrix vaccine Pap smear UTD. Follows with GYN Mammogram UTD Colon cancer screening due, she opts for cologuard.   Discussed the importance of a healthy diet and regular exercise in order for weight loss, and to reduce the risk of further co-morbidity.  Exam stable. Labs pending.  Follow up in 1 year for repeat physical.

## 2024-07-20 NOTE — Assessment & Plan Note (Signed)
 Repeat lipid panel pending.  Continue rosuvastatin 40 mg daily.

## 2024-07-20 NOTE — Progress Notes (Signed)
 Subjective:    Patient ID: Jamie Patterson, female    DOB: 12/09/1960, 63 y.o.   MRN: [id number]  Jamie Patterson is a very pleasant 63 y.o. female who presents today for complete physical and follow up of chronic conditions.  Immunizations: -Tetanus: Completed in 2013 -Influenza: Completed this season -Shingles: Completed Shingrix dose x 1 -Pneumonia: Completed 2016   Diet: Fair diet.  Exercise: No regular exercise.  Eye exam: Completes annually  Dental exam: Completes semi-annually    Pap Smear: Completes per GYN Mammogram: Completed in January 2025 Bone Density Scan: Completed in 2024  Colonoscopy: Completed in 2021   BP Readings from Last 3 Encounters:  07/20/24 124/76  06/08/24 130/84  05/04/24 (!) 159/107      Review of Systems  Constitutional:  Negative for unexpected weight change.  HENT:  Negative for rhinorrhea.   Respiratory:  Positive for shortness of breath. Negative for cough.   Cardiovascular:  Negative for chest pain.  Gastrointestinal:  Negative for constipation and diarrhea.  Genitourinary:  Negative for difficulty urinating.  Musculoskeletal:  Positive for arthralgias.  Skin:  Negative for rash.  Allergic/Immunologic: Negative for environmental allergies.  Neurological:  Negative for dizziness and headaches.  Psychiatric/Behavioral:  The patient is not nervous/anxious.          Past Medical History:  Diagnosis Date   Acute hypoxemic respiratory failure (HCC) 02/27/2024   Acute hypoxic respiratory failure (HCC) 02/27/2024   Amblyopia    ARTHRITIS, TRAUMATIC, ANKLE 08/07/2009   Trimalleolar fracture on right s/p fusion     Benign essential tremor    worsened by anxiety   CAP (community acquired pneumonia) 03/10/2024   Cholelithiasis 03/10/2024   Chronic pain due to injury    at R ankle due to ankle fracture   Depression    followed by psych   GERD (gastroesophageal reflux disease)    Hemoptysis 03/03/2022   History of hypertension    HLD  (hyperlipidemia)    Hypertension    Imbalance    likely multifactorial (s/p balance training at [facility] 08/2010) ?CMT   Influenza A 09/15/2018   Iron deficiency anemia    LLL pneumonia 10/05/2018   MRSA pneumonia (HCC) 02/2024   Pre-diabetes    Right ankle pain 2003   trimalleolar fx s/p fusion   Seizure (HCC)    focal, ex vacuo ventriculomegaly?, pending neuro w/u with sleep deprived EEG   Syncope and collapse 08/19/2011    Social History   Socioeconomic History   Marital status: Married    Spouse name: Not on file   Number of children: 1   Years of education: Not on file   Highest education level: Not on file  Occupational History   Occupation: Disabled    Comment: depression   Occupation: DISABILITY    Employer: UNEMPLOYED  Tobacco Use   Smoking status: Former    Current packs/day: 0.00    Average packs/day: 1 pack/day for 20.0 years (20.0 ttl pk-yrs)    Types: Cigarettes    Start date: 08/12/1975    Quit date: 08/12/1995    Years since quitting: 28.9   Smokeless tobacco: Never  Vaping Use   Vaping status: Never Used  Substance and Sexual Activity   Alcohol use: No    Alcohol/week: 0.0 standard drinks of alcohol    Comment: occassinal   Drug use: No   Sexual activity: Not on file  Other Topics Concern   Not on file  Social History Narrative  No caffeine. Divorced. 1 child. 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 Has a cane but hasn't needed it recently, although endorses that she falls a lot    Social Drivers of Corporate Investment Banker Strain: Low Risk  (06/08/2024)   Overall Financial Resource Strain (CARDIA)    Difficulty of Paying Living Expenses: Not hard at all  Food Insecurity: No Food Insecurity (06/08/2024)   Hunger Vital Sign    Worried About Running Out of Food in the Last Year: Never true    Ran Out of Food in the Last Year: Never true  Transportation Needs: No Transportation Needs (06/08/2024)   PRAPARE - Therapist, Art (Medical): No    Lack of Transportation (Non-Medical): No  Physical Activity: Inactive (06/08/2024)   Exercise Vital Sign    Days of Exercise per Week: 0 days    Minutes of Exercise per Session: 0 min  Stress: No Stress Concern Present (06/08/2024)   [employer] of Occupational Health - Occupational Stress Questionnaire    Feeling of Stress: Only a little  Social Connections: Moderately Isolated (06/08/2024)   Social Connection and Isolation Panel    Frequency of Communication with Friends and Family: More than three times a week    Frequency of Social Gatherings with Friends and Family: More than three times a week    Attends Religious Services: Never    Database Administrator or Organizations: No    Attends Banker Meetings: Never    Marital Status: Married  Catering Manager Violence: Not At Risk (06/08/2024)   Humiliation, Afraid, Rape, and Kick questionnaire    Fear of Current or Ex-Partner: No    Emotionally Abused: No    Physically Abused: No    Sexually Abused: No    Past Surgical History:  Procedure Laterality Date   ANKLE FUSION  02/2004   Right-50% disability    BRONCHIAL WASHINGS  03/04/2022   Procedure: BRONCHIAL WASHINGS;  Surgeon: Geronimo Amel, MD;  Location: [facility];  Service: Endoscopy;;   CARPAL TUNNEL RELEASE  12/2017   L CTR repair, R thumb A1 pulley release (Gramig)   CESAREAN SECTION     HAMMER TOE SURGERY  09/12/2019   hospitalization  08/2011   syncope thought 2/2 seizure, MRI - Hyperintensity in the cerebral white matter bilaterally is nonspecific, started on Keppra    ORIF ANKLE FRACTURE  06/2003   Right   VIDEO BRONCHOSCOPY Bilateral 03/04/2022   Procedure: VIDEO BRONCHOSCOPY WITHOUT FLUORO;  Surgeon: Geronimo Amel, MD;  Location: [facility];  Service: Endoscopy;  Laterality: Bilateral;    Family History  Problem Relation Age of Onset   Depression Mother    Arrhythmia Mother 17       Dx  with VTach after having sudden collapse, did NOT have h/o AMI's    Atrial fibrillation Father    Hypertension Father    Osteoarthritis Father    Aortic aneurysm Father    Hepatitis Brother        Hep. C   Anemia Sister     Allergies  Allergen Reactions   Amlodipine  Swelling    Ankle edema    Current Outpatient Medications on File Prior to Visit  Medication Sig Dispense Refill   albuterol  (VENTOLIN  HFA) 108 (90 Base) MCG/ACT inhaler INHALE TWO PUFFS INTO THE LUNGS EVERY SIX HOURS AS NEEDED FOR WHEEZING OR SHORTNESS OF BREATH. 8.5 g 0   Ascorbic Acid (VITAMIN C) 1000 MG  tablet Take 1,000 mg by mouth daily.     carvedilol  (COREG ) 25 MG tablet Take 1 tablet (25 mg total) by mouth 2 (two) times daily with a meal. 180 tablet 3   cholecalciferol  (VITAMIN D3) 25 MCG (1000 UNIT) tablet Take 1,000 Units by mouth daily.     cycloSPORINE  (RESTASIS ) 0.05 % ophthalmic emulsion Place 1 drop into both eyes 2 (two) times daily.     DULoxetine  (CYMBALTA ) 60 MG capsule Take 60 mg by mouth 2 (two) times daily.      escitalopram  (LEXAPRO ) 10 MG tablet Take 10 mg by mouth daily.      gabapentin  (NEURONTIN ) 300 MG capsule Take 300-600 mg by mouth See admin instructions. Take 300 mg by mouth in the morning and afternoon and take 600 mg at night     ibuprofen (ADVIL) 200 MG tablet Take 200-800 mg by mouth every 6 (six) hours as needed for moderate pain (pain score 4-6).     lamoTRIgine  (LAMICTAL ) 150 MG tablet TAKE 1 TABLET BY MOUTH TWICE A DAY 180 tablet 1   levothyroxine  (SYNTHROID ) 50 MCG tablet TAKE ONE TABLET BY MOUTH ONCE A DAY. TAKE ON AN EMPTY STOMACH WITH A GLASS OF WATER ATLEAST 30-60 MIN BEFORE BREAKFAST 90 tablet 0   MAGNESIUM PO Take 400 mg by mouth 3 (three) times daily.      Melatonin 10 MG TABS Take 20 mg by mouth at bedtime.     Multiple Vitamin (MULTIVITAMIN) tablet Take 1 tablet by mouth daily.     olmesartan  (BENICAR ) 40 MG tablet TAKE ONE TABLET (40 MG TOTAL) BY MOUTH DAILY. FOR BLOOD  PRESSURE. 90 tablet 0   primidone  (MYSOLINE ) 50 MG tablet Take 1 tablet (50 mg total) by mouth 2 (two) times daily. 180 tablet 3   rosuvastatin  (CRESTOR ) 40 MG tablet Take 1 tablet (40 mg total) by mouth daily. 90 tablet 3   tiZANidine  (ZANAFLEX ) 4 MG tablet Take 8 mg by mouth at bedtime.     vitamin B-12 (CYANOCOBALAMIN ) 1000 MCG tablet Take 1,000 mcg by mouth daily.     No current facility-administered medications on file prior to visit.    BP 124/76   Pulse 83   Temp 98.4 F (36.9 C) (Oral)   Ht 5' 4.25 (1.632 m)   Wt 224 lb 2 oz (101.7 kg)   LMP 06/01/2013   SpO2 100%   BMI 38.17 kg/m  Objective:   Physical Exam HENT:     Right Ear: Tympanic membrane and ear canal normal.     Left Ear: Tympanic membrane and ear canal normal.  Eyes:     Pupils: Pupils are equal, round, and reactive to light.  Cardiovascular:     Rate and Rhythm: Normal rate and regular rhythm.  Pulmonary:     Effort: Pulmonary effort is normal.     Breath sounds: Normal breath sounds.  Abdominal:     General: Bowel sounds are normal.     Palpations: Abdomen is soft.     Tenderness: There is no abdominal tenderness.  Musculoskeletal:        General: Normal range of motion.     Cervical back: Neck supple.  Skin:    General: Skin is warm and dry.  Neurological:     Mental Status: She is alert and oriented to person, place, and time.     Cranial Nerves: No cranial nerve deficit.     Deep Tendon Reflexes:     Reflex Scores:  Patellar reflexes are 2+ on the right side and 2+ on the left side. Psychiatric:        Mood and Affect: Mood normal.     Physical Exam        Assessment & Plan:  Hypothyroidism, unspecified type Assessment & Plan: She is taking levothyroxine  correctly.   Repeat TSH pending Continue levothyroixine 50 mcg daily  Orders: -     TSH  Prediabetes Assessment & Plan: Repeat A1C pending.  Work on a healthy diet and regular exercise in order for weight loss, and  to reduce the risk of further co-morbidity.   Orders: -     Hemoglobin A1c  Pure hypercholesterolemia Assessment & Plan: Repeat lipid panel pending.  Continue rosuvastatin  40 mg daily.  Orders: -     Lipid panel  Screening for colon cancer -     Cologuard  Essential hypertension Assessment & Plan: Controlled.  Continue carvedilol  25 mg BID, olmesartan  40 mg daily.  Reviewed BMP from July 2025   Tremor Assessment & Plan: Stable. Following with neurology.  Continue Primidone  50 mg BID   Preventative health care Assessment & Plan: Prevnar 20 provided today. Discussed second Shingrix vaccine Pap smear UTD. Follows with GYN Mammogram UTD Colon cancer screening due, she opts for cologuard.   Discussed the importance of a healthy diet and regular exercise in order for weight loss, and to reduce the risk of further co-morbidity.  Exam stable. Labs pending.  Follow up in 1 year for repeat physical.    MDD (major depressive disorder), recurrent episode, moderate (HCC) Assessment & Plan: Stable.  Following with psychiatry.  Continue Cymbalta  60 mg twice daily, Lexapro  10 mg daily, Lamictal  150 mg twice daily   Chronic cough Assessment & Plan: Ongoing.  Following pulmonology, office notes reviewed from September 2025. Continue albuterol  inhaler as needed     Assessment and Plan Assessment & Plan         Comer MARLA Gaskins, NP

## 2024-07-20 NOTE — Assessment & Plan Note (Signed)
 Ongoing.  Following pulmonology, office notes reviewed from September 2025. Continue albuterol  inhaler as needed

## 2024-07-20 NOTE — Patient Instructions (Signed)
 Stop by the lab prior to leaving today. I will notify you of your results once received.   Complete the Cologuard as discussed.  It was a pleasure to see you today!

## 2024-07-20 NOTE — Assessment & Plan Note (Signed)
 Stable. Following with neurology.  Continue Primidone  50 mg BID

## 2024-07-20 NOTE — Addendum Note (Signed)
 Addended by: Locklyn Henriquez on: 07/20/2024 10:51 AM   Modules accepted: Orders

## 2024-07-20 NOTE — Assessment & Plan Note (Signed)
 She is taking levothyroxine  correctly.   Repeat TSH pending Continue levothyroixine 50 mcg daily

## 2024-07-20 NOTE — Assessment & Plan Note (Signed)
 Stable.  Following with psychiatry.  Continue Cymbalta  60 mg twice daily, Lexapro  10 mg daily, Lamictal  150 mg twice daily

## 2024-07-20 NOTE — Assessment & Plan Note (Signed)
 Repeat A1C pending.  Work on a healthy diet and regular exercise in order for weight loss, and to reduce the risk of further co-morbidity.

## 2024-07-21 ENCOUNTER — Ambulatory Visit: Payer: Self-pay | Admitting: Primary Care

## 2024-07-25 ENCOUNTER — Ambulatory Visit: Payer: Self-pay | Admitting: Primary Care

## 2024-07-27 ENCOUNTER — Other Ambulatory Visit: Payer: Self-pay | Admitting: Cardiovascular Disease

## 2024-07-27 ENCOUNTER — Other Ambulatory Visit: Payer: Self-pay | Admitting: Primary Care

## 2024-07-27 DIAGNOSIS — I10 Essential (primary) hypertension: Secondary | ICD-10-CM

## 2024-07-27 DIAGNOSIS — R000 Tachycardia, unspecified: Secondary | ICD-10-CM

## 2024-07-27 DIAGNOSIS — E039 Hypothyroidism, unspecified: Secondary | ICD-10-CM

## 2024-07-27 NOTE — Telephone Encounter (Signed)
 Agree this needs a CT Chest and follow up.

## 2024-07-28 ENCOUNTER — Other Ambulatory Visit: Payer: Self-pay | Admitting: Internal Medicine

## 2024-07-28 DIAGNOSIS — J189 Pneumonia, unspecified organism: Secondary | ICD-10-CM

## 2024-08-03 LAB — COLOGUARD: COLOGUARD: NEGATIVE

## 2024-08-09 ENCOUNTER — Ambulatory Visit
Admission: RE | Admit: 2024-08-09 | Discharge: 2024-08-09 | Disposition: A | Discharge status: Home / Self Care | Source: Ambulatory Visit | Attending: Internal Medicine | Admitting: Internal Medicine

## 2024-08-09 DIAGNOSIS — J189 Pneumonia, unspecified organism: Secondary | ICD-10-CM

## 2024-08-25 ENCOUNTER — Telehealth: Payer: Self-pay

## 2024-08-25 ENCOUNTER — Telehealth (HOSPITAL_BASED_OUTPATIENT_CLINIC_OR_DEPARTMENT_OTHER): Payer: Self-pay

## 2024-08-25 ENCOUNTER — Encounter (HOSPITAL_BASED_OUTPATIENT_CLINIC_OR_DEPARTMENT_OTHER): Payer: Self-pay

## 2024-08-25 ENCOUNTER — Ambulatory Visit

## 2024-08-25 DIAGNOSIS — R062 Wheezing: Secondary | ICD-10-CM

## 2024-08-25 DIAGNOSIS — J479 Bronchiectasis, uncomplicated: Secondary | ICD-10-CM | POA: Diagnosis not present

## 2024-08-25 DIAGNOSIS — Z87891 Personal history of nicotine dependence: Secondary | ICD-10-CM | POA: Diagnosis not present

## 2024-08-25 DIAGNOSIS — R911 Solitary pulmonary nodule: Secondary | ICD-10-CM | POA: Diagnosis not present

## 2024-08-25 DIAGNOSIS — J410 Simple chronic bronchitis: Secondary | ICD-10-CM | POA: Insufficient documentation

## 2024-08-25 DIAGNOSIS — Z6839 Body mass index (BMI) 39.0-39.9, adult: Secondary | ICD-10-CM

## 2024-08-25 DIAGNOSIS — R918 Other nonspecific abnormal finding of lung field: Secondary | ICD-10-CM | POA: Insufficient documentation

## 2024-08-25 DIAGNOSIS — J15212 Pneumonia due to Methicillin resistant Staphylococcus aureus: Secondary | ICD-10-CM | POA: Diagnosis not present

## 2024-08-25 MED ORDER — PREDNISONE 10 MG PO TABS: ORAL_TABLET | ORAL | 0 refills | Status: DC

## 2024-08-25 MED ORDER — IPRATROPIUM-ALBUTEROL 0.5-2.5 (3) MG/3ML IN SOLN: 3.0000 mL | Freq: Four times a day (QID) | RESPIRATORY_TRACT | 1 refills | Status: AC | PRN

## 2024-08-25 MED ORDER — DOXYCYCLINE HYCLATE 100 MG PO TABS: 100.0000 mg | ORAL_TABLET | Freq: Two times a day (BID) | ORAL | 0 refills | Status: DC

## 2024-08-25 NOTE — Telephone Encounter (Signed)
 Please schedule the following:  Provider performing procedure:Irving Bloor Diagnosis: right and left lung nodules, airways obstruction Which side if for nodule / mass? both Procedure: nav bronchoscopy Has patient been spoken to by Provider and given informed consent? yes Anesthesia: GA Do you need Fluro? YES Duration of procedure: 80 min Date: Feb 12/2023 Alternate Date:   Time: AM/ PM ( 11 am or later as third case of the day so Dr Zaida can be there) Location: [facility] Does patient have OSA?  DM?  Or Latex allergy ?  Medication Restriction/ Anticoagulate/Antiplatelet: none Pre-op Labs Ordered:determined by Anesthesia Imaging request: super D ordered  (If, SuperDimension CT Chest, please have STAT courier sent to ENDO)

## 2024-08-25 NOTE — Assessment & Plan Note (Addendum)
 Recommend weight loss with exercise and diet control

## 2024-08-25 NOTE — Assessment & Plan Note (Deleted)
 Jamie Patterson

## 2024-08-25 NOTE — Assessment & Plan Note (Addendum)
 This is new/worsening compared to last CT chest.  Could be inflammatory Will plan to obtain some biopsies and blood biopsies from this nodule as well Need to rule out fungal infection, atypical infections likely MAC/o  Orders:   Procedural/ Surgical Case Request: VIDEO BRONCHOSCOPY WITH ENDOBRONCHIAL NAVIGATION; Future   CT SUPER D CHEST WO CONTRAST; Future

## 2024-08-25 NOTE — Assessment & Plan Note (Deleted)
 [name]

## 2024-08-25 NOTE — Patient Instructions (Addendum)
" °  VISIT SUMMARY: During your visit, we discussed your chronic cough and shortness of breath, which have been ongoing for several years. We reviewed your history of recurrent pneumonia and other infections, and we have planned further diagnostic procedures to better understand your condition.  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 We suspect an atypical infection , need to rule out cancer as some of the nodules are persistent over 6 months duration and there is an airways narrowing -We have prescribed a course of steroids to reduce airway inflammation. -We have prescribed a regular antibiotic to address potential infection. -Use the albuterol -ipratropium solution for your nebulizer two to three times a day. -We have scheduled a pre-procedure CT scan closer to the bronchoscopy date.  PLANNED BRONCHOSCOPY WITH BIOPSY AND PRE-PROCEDURE CT: We need to perform a bronchoscopy with biopsy to evaluate suspicious areas in your right lung and rule out cancer. This will help us  determine if your symptoms are due to an infection or cancer or both -Your bronchoscopy with biopsy is scheduled for February 5th. -Ensure you have someone to drive you home after the procedure. -Fast after midnight before the procedure. -We have scheduled a follow-up appointment for February 23rd to discuss the results and further management.      Contains text generated by [employer].   "

## 2024-08-25 NOTE — Assessment & Plan Note (Addendum)
 Reviewed CT chest from January 06/2025 and compared with CT chest July/2025 It shows improving right middle lobe consolidation, however lateral segment of right middle lobe is still completely obstructed with distal consolidation.  This remains concerning for central lesion causing postobstructive changes versus chronic consolidation/scarring. Will plan for bronchoscopy and biopsy of the right middle lobe nodule I explained the bronchoscopic biopsy procedure at depth.  We discussed about other potential options including continued follow up, CT guided biopsy, surgical biopsy. Risks and benefits of bronchoscopy +/- biopsy discussed with the patient and his family in details and all the questions were answered. They understand the risk of bleeding, pneumothorax, injury to blood vessels and would like to proceed for the bronchoscopy with biopsy.  Will set up bronchoscopy with biopsy Orders:   Procedural/ Surgical Case Request: VIDEO BRONCHOSCOPY WITH ENDOBRONCHIAL NAVIGATION; Future   CT SUPER D CHEST WO CONTRAST; Future

## 2024-08-25 NOTE — Assessment & Plan Note (Addendum)
 Will need updated PFT and next visit Needs

## 2024-08-25 NOTE — Telephone Encounter (Signed)
 Letter for Bronch was sent and given to patient by Loras Porter. NFN  I could not addend the other phone note which is why I'm sending this one.

## 2024-08-25 NOTE — Progress Notes (Signed)
 "  New Patient Pulmonology Office Visit   Subjective:  Patient ID: Jamie Patterson, female    DOB: [birth year]  MRN: [id number]  Referred by: Gretta Comer POUR, NP  CC:  Chief Complaint  Patient presents with   Shortness of Breath    Ct chest result. Patient states no improvement in cough.    HPI Jamie Patterson is a 64 y.o. female who used to follow Dr Meade and is here to establish care with me. She is a former tobacco use disorder (quit 1997) and recurrent bronchitis and pneumonia.  Hospitalized for hemoptysis in 2023 and had a bronchoscopy negative for DAH. Cultures grew few haemophilus influenza and MRSA, AFB negative. Hospitalized again in July 2025 for MRSA pneumonia. She also has history of MRSA sinus infections and has seen an ENT in [place] in the past.  Normal IgG/IgM, IgA  Discussed the use of AI scribe software for clinical note transcription with the patient, who gave verbal consent to proceed.  History of Present Illness Jamie Patterson is a 64 year old female with recurrent pneumonia who presents with chronic cough and shortness of breath.  She has had a chronic cough for approximately seven years, characterized by persistent coughing spells, particularly at night. The sputum is brownish-green, and she produces a significant amount, necessitating the use of paper cups for disposal. Previous treatment with Protonix  did not yield improvement.  She experiences shortness of breath, sometimes occurring without exertion. She uses albuterol  as needed, though its effectiveness is uncertain. She has also tried [employer], which she reports did not make a difference, and has a nebulizer machine at home.  Her medical history includes multiple episodes of pneumonia (she reports 5) since 2020. She has undergone bronchoscopy in the past due to hemoptysis. Additionally, she has a history of  MRSA infections, requiring prolonged antibiotic courses.   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 She lives with her husband and frequently attends her three grandchildren's sports activities. No known heart conditions per patient report. She reports sinus drainage.    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   Trimalleolar fracture on right s/p fusion     Benign essential tremor    worsened by anxiety   CAP (community acquired pneumonia) 03/10/2024   Cholelithiasis 03/10/2024   Chronic pain due to injury    at R ankle due to ankle fracture   Depression    followed by psych   GERD (gastroesophageal reflux disease)    Hemoptysis 03/03/2022   History of hypertension    HLD (hyperlipidemia)    Hypertension    Imbalance    likely multifactorial (s/p balance training at [facility] 08/2010) ?CMT   Influenza A 09/15/2018   Iron deficiency anemia    LLL pneumonia 10/05/2018   MRSA pneumonia (HCC) 02/2024   Pre-diabetes    Right ankle pain 2003   trimalleolar fx s/p fusion   Seizure (HCC)    focal, ex vacuo ventriculomegaly?, pending neuro w/u with sleep deprived EEG   Syncope and collapse 08/19/2011   Past Surgical History:  Procedure Laterality Date   ANKLE FUSION  02/2004   Right-50% disability    BRONCHIAL WASHINGS  03/04/2022   Procedure: BRONCHIAL  WASHINGS;  Surgeon: Geronimo Amel, MD;  Location: [facility];  Service: Endoscopy;;   CARPAL TUNNEL RELEASE  12/2017   L CTR repair, R thumb A1 pulley release (Gramig)   CESAREAN SECTION     HAMMER TOE SURGERY  09/12/2019   hospitalization  08/2011   syncope thought 2/2 seizure, MRI - Hyperintensity in the cerebral white matter bilaterally is nonspecific, started on Keppra    ORIF ANKLE FRACTURE  06/2003   Right   VIDEO BRONCHOSCOPY  Bilateral 03/04/2022   Procedure: VIDEO BRONCHOSCOPY WITHOUT FLUORO;  Surgeon: Geronimo Amel, MD;  Location: [facility];  Service: Endoscopy;  Laterality: Bilateral;   Family History  Problem Relation Age of Onset   Depression Mother    Arrhythmia Mother 67       Dx with VTach after having sudden collapse, did NOT have h/o AMI's    Atrial fibrillation Father    Hypertension Father    Osteoarthritis Father    Aortic aneurysm Father    Hepatitis Brother        Hep. C   Anemia Sister    Social History   Socioeconomic History   Marital status: Married    Spouse name: Not on file   Number of children: 1   Years of education: Not on file   Highest education level: Not on file  Occupational History   Occupation: Disabled    Comment: depression   Occupation: DISABILITY    Employer: UNEMPLOYED  Tobacco Use   Smoking status: Former    Current packs/day: 0.00    Average packs/day: 1 pack/day for 20.0 years (20.0 ttl pk-yrs)    Types: Cigarettes    Start date: 08/12/1975    Quit date: 08/12/1995    Years since quitting: 29.0   Smokeless tobacco: Never  Vaping Use   Vaping status: Never Used  Substance and Sexual Activity   Alcohol use: No    Alcohol/week: 0.0 standard drinks of alcohol    Comment: occassinal   Drug use: No   Sexual activity: Not on file  Other Topics Concern   Not on file  Social History Narrative   No caffeine. Divorced. 1 child. 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 Has a cane but hasn't needed it recently, although endorses that she falls a lot    Social Drivers of Health   Tobacco Use: Medium Risk (08/25/2024)   Patient History    Smoking Tobacco Use: Former    Smokeless Tobacco Use: Never    Passive Exposure: Not on file  Financial Resource Strain: Low Risk (06/08/2024)   Overall Financial Resource Strain (CARDIA)    Difficulty of Paying Living Expenses: Not hard at all  Food Insecurity: No Food Insecurity (06/08/2024)    Epic    Worried About Programme Researcher, Broadcasting/film/video in the Last Year: Never true    Ran Out of Food in the Last Year: Never true  Transportation Needs: No Transportation Needs (06/08/2024)   Epic    Lack of Transportation (Medical): No    Lack of Transportation (Non-Medical): No  Physical Activity: Inactive (06/08/2024)   Exercise Vital Sign    Days of Exercise per Week: 0 days    Minutes of Exercise per Session: 0 min  Stress: No Stress Concern Present (06/08/2024)   [employer] of Occupational Health - Occupational Stress Questionnaire    Feeling of Stress: Only a little  Social Connections: Moderately Isolated (06/08/2024)   Social Connection and Isolation Panel  Frequency of Communication with Friends and Family: More than three times a week    Frequency of Social Gatherings with Friends and Family: More than three times a week    Attends Religious Services: Never    Database Administrator or Organizations: No    Attends Banker Meetings: Never    Marital Status: Married  Catering Manager Violence: Not At Risk (06/08/2024)   Epic    Fear of Current or Ex-Partner: No    Emotionally Abused: No    Physically Abused: No    Sexually Abused: No  Depression (PHQ2-9): Low Risk (07/20/2024)   Depression (PHQ2-9)    PHQ-2 Score: 0  Recent Concern: Depression (PHQ2-9) - Medium Risk (06/08/2024)   Depression (PHQ2-9)    PHQ-2 Score: 5  Alcohol Screen: Low Risk (06/08/2024)   Alcohol Screen    Last Alcohol Screening Score (AUDIT): 0  Housing: Low Risk (06/08/2024)   Epic    Unable to Pay for Housing in the Last Year: No    Number of Times Moved in the Last Year: 0    Homeless in the Last Year: No  Utilities: Not At Risk (06/08/2024)   Epic    Threatened with loss of utilities: No  Health Literacy: Adequate Health Literacy (06/08/2024)   B1300 Health Literacy    Frequency of need for help with medical instructions: Never         Objective:  BP 134/73   Pulse  82   Temp 98.1 F (36.7 C) (Oral)   Ht 5' 4 (1.626 m)   Wt 229 lb (103.9 kg)   LMP 06/01/2013   SpO2 96%   BMI 39.31 kg/m    Physical Exam Constitutional:      General: She is not in acute distress.    Appearance: Normal appearance.     Comments: Constant coughing.  HENT:     Mouth/Throat:     Mouth: Mucous membranes are moist.  Cardiovascular:     Rate and Rhythm: Normal rate.  Pulmonary:     Effort: No respiratory distress.     Breath sounds: No wheezing or rales.  Musculoskeletal:     Right lower leg: No edema.     Left lower leg: No edema.  Skin:    General: Skin is warm.  Neurological:     Mental Status: She is alert and oriented to person, place, and time.  Psychiatric:        Mood and Affect: Mood normal.     Diagnostic Review:    Pft    Latest Ref Rng & Units 01/14/2021    9:56 AM  PFT Results  FVC-Pre L 2.90   FVC-Predicted Pre % 78   FVC-Post L 2.72   FVC-Predicted Post % 73   Pre FEV1/FVC % % 72   Post FEV1/FCV % % 71   FEV1-Pre L 2.09   FEV1-Predicted Pre % 72   FEV1-Post L 1.94   DLCO uncorrected ml/min/mmHg 20.62   DLCO UNC% % 91   DLCO corrected ml/min/mmHg 20.62   DLCO COR %Predicted % 91   DLVA Predicted % 98   TLC L 5.80   TLC % Predicted % 105   RV % Predicted % 123        Reviewed CT chest from January 06/2025 and compared with CT chest July/2025 It shows improving right middle lobe consolidation, however lateral segment of right middle lobe is still completely obstructed with distal consolidation.  This remains  concerning for central lesion causing postobstructive changes versus chronic consolidation/scarring. CT also shows persistent mild symmetric bronchiectasis and treating but nodularity Left lower lobe shows distal nodule vs airway impaction and tree-in-bud nodularity Shows mild peripheral pulmonary fibrotic changes  The above concerning findings were not present on CT chest July/2023.   Reviewed prior pulmonary notes  from this clinic  Reviewed prior pulmonary function test  ECHO 10/2023  1. Left ventricular ejection fraction, by estimation, is 55 to 60%. Left  ventricular ejection fraction by 3D volume is 55 %. The left ventricle has  normal function. The left ventricle has no regional wall motion  abnormalities. Indeterminate diastolic  filling due to E-A fusion. The average left ventricular global  longitudinal strain is -18.9 %. The global longitudinal strain is normal.   2. Right ventricular systolic function is normal. The right ventricular  size is normal. Tricuspid regurgitation signal is inadequate for assessing  PA pressure.   3. The mitral valve is normal in structure. Trivial mitral valve  regurgitation. No evidence of mitral stenosis.   4. The aortic valve is tricuspid. Aortic valve regurgitation is trivial.  No aortic stenosis is present.   5. The inferior vena cava is normal in size with greater than 50%  respiratory variability, suggesting right atrial pressure of 3 mmHg.   Results        Assessment & Plan:   Assessment & Plan Nodule of middle lobe of right lung Reviewed CT chest from January 06/2025 and compared with CT chest July/2025 It shows improving right middle lobe consolidation, however lateral segment of right middle lobe is still completely obstructed with distal consolidation.  This remains concerning for central lesion causing postobstructive changes versus chronic consolidation/scarring. Will plan for bronchoscopy and biopsy of the right middle lobe nodule I explained the bronchoscopic biopsy procedure at depth.  We discussed about other potential options including continued follow up, CT guided biopsy, surgical biopsy. Risks and benefits of bronchoscopy +/- biopsy discussed with the patient and his family in details and all the questions were answered. They understand the risk of bleeding, pneumothorax, injury to blood vessels and would like to proceed for the  bronchoscopy with biopsy.  Will set up bronchoscopy with biopsy Orders:   Procedural/ Surgical Case Request: VIDEO BRONCHOSCOPY WITH ENDOBRONCHIAL NAVIGATION; Future   CT SUPER D CHEST WO CONTRAST; Future  Nodule of lower lobe of left lung This is new/worsening compared to last CT chest.  Could be inflammatory Will plan to obtain some biopsies and blood biopsies from this nodule as well Need to rule out fungal infection, atypical infections likely MAC/o  Orders:   Procedural/ Surgical Case Request: VIDEO BRONCHOSCOPY WITH ENDOBRONCHIAL NAVIGATION; Future   CT SUPER D CHEST WO CONTRAST; Future  Wheezing Patient has ongoing coughing and wheezing Will treat with a course of steroids and antibiotics Orders:   predniSONE  (DELTASONE ) 10 MG tablet; Take 4 tablets a day for 2 days, 3 tab a day for 2 days, 2 tab a day for 2 days and 1 tab a day for 1 day and stop.   doxycycline  (VIBRA -TABS) 100 MG tablet; Take 1 tablet (100 mg total) by mouth 2 (two) times daily.  Simple chronic bronchitis (HCC) Will need updated PFT and next visit Needs     Obesity, morbid (HCC) Recommend weight loss with exercise and diet control     Pneumonia of right lung due to methicillin resistant Staphylococcus aureus (MRSA), unspecified part of lung (HCC) History of MRSA pneumonia.  Treated appropriately in the past    Bronchiectasis without complication (HCC) Ig panel normal Continue albuterol  for now Didn't respond to stiolto in past       Thank you for the opportunity to take part in the care of Jamie Patterson   Return in about 6 weeks (around 10/03/2024).   Lezlie Ritchey Pleas, MD [facility] Office: [phone]  I personally spent a total of 40 minutes in the care of the patient today including getting/reviewing separately obtained history, performing a medically appropriate exam/evaluation, counseling and educating, referring and communicating with other health care professionals,  documenting clinical information in the EHR, independently interpreting results, and communicating results.     [1]  Current Outpatient Medications:    albuterol  (VENTOLIN  HFA) 108 (90 Base) MCG/ACT inhaler, INHALE TWO PUFFS INTO THE LUNGS EVERY SIX HOURS AS NEEDED FOR WHEEZING OR SHORTNESS OF BREATH., Disp: 8.5 g, Rfl: 0   Ascorbic Acid (VITAMIN C) 1000 MG tablet, Take 1,000 mg by mouth daily., Disp: , Rfl:    carvedilol  (COREG ) 25 MG tablet, TAKE 1 TABLET (25 MG TOTAL) BY MOUTH 2 TIMES DAILY WITH A MEAL., Disp: 180 tablet, Rfl: 1   cholecalciferol  (VITAMIN D3) 25 MCG (1000 UNIT) tablet, Take 1,000 Units by mouth daily., Disp: , Rfl:    cycloSPORINE  (RESTASIS ) 0.05 % ophthalmic emulsion, Place 1 drop into both eyes 2 (two) times daily., Disp: , Rfl:    doxycycline  (VIBRA -TABS) 100 MG tablet, Take 1 tablet (100 mg total) by mouth 2 (two) times daily., Disp: 14 tablet, Rfl: 0   DULoxetine  (CYMBALTA ) 60 MG capsule, Take 60 mg by mouth 2 (two) times daily. , Disp: , Rfl:    escitalopram  (LEXAPRO ) 10 MG tablet, Take 10 mg by mouth daily. , Disp: , Rfl:    gabapentin  (NEURONTIN ) 300 MG capsule, Take 300-600 mg by mouth See admin instructions. Take 300 mg by mouth in the morning and afternoon and take 600 mg at night, Disp: , Rfl:    ibuprofen (ADVIL) 200 MG tablet, Take 200-800 mg by mouth every 6 (six) hours as needed for moderate pain (pain score 4-6)., Disp: , Rfl:    ipratropium-albuterol  (DUONEB) 0.5-2.5 (3) MG/3ML SOLN, Take 3 mLs by nebulization every 6 (six) hours as needed., Disp: 360 mL, Rfl: 1   lamoTRIgine  (LAMICTAL ) 150 MG tablet, TAKE 1 TABLET BY MOUTH TWICE A DAY, Disp: 180 tablet, Rfl: 1   levothyroxine  (SYNTHROID ) 50 MCG tablet, TAKE ONE TABLET BY MOUTH ONCE A DAY. TAKE ON AN EMPTY STOMACH WITH A GLASS OF WATER ATLEAST 30-60 MIN BEFORE BREAKFAST, Disp: 90 tablet, Rfl: 2   MAGNESIUM PO, Take 400 mg by mouth 3 (three) times daily. , Disp: , Rfl:    Melatonin 10 MG TABS, Take 20 mg by  mouth at bedtime., Disp: , Rfl:    Multiple Vitamin (MULTIVITAMIN) tablet, Take 1 tablet by mouth daily., Disp: , Rfl:    olmesartan  (BENICAR ) 40 MG tablet, TAKE ONE TABLET (40 MG TOTAL) BY MOUTH DAILY. FOR BLOOD PRESSURE., Disp: 90 tablet, Rfl: 2   predniSONE  (DELTASONE ) 10 MG tablet, Take 4 tablets a day for 2 days, 3 tab a day for 2 days, 2 tab a day for 2 days and 1 tab a day for 1 day and stop., Disp: 19 tablet, Rfl: 0   primidone  (MYSOLINE ) 50 MG tablet, Take 1 tablet (50 mg total) by mouth 2 (two) times daily., Disp: 180 tablet, Rfl: 3   rosuvastatin  (CRESTOR ) 40  MG tablet, Take 1 tablet (40 mg total) by mouth daily., Disp: 90 tablet, Rfl: 3   tiZANidine  (ZANAFLEX ) 4 MG tablet, Take 8 mg by mouth at bedtime., Disp: , Rfl:    vitamin B-12 (CYANOCOBALAMIN ) 1000 MCG tablet, Take 1,000 mcg by mouth daily., Disp: , Rfl:   "

## 2024-08-25 NOTE — Telephone Encounter (Signed)
 Letter for Bronch was sent and given to patient by Loras Porter. NFN   I could not addend the other phone note which is why I'm sending this one.    I will send to [facility] to check auth

## 2024-08-25 NOTE — H&P (View-Only) (Signed)
 "  New Patient Pulmonology Office Visit   Subjective:  Patient ID: Jamie Patterson, female    DOB: [birth year]  MRN: [id number]  Referred by: Gretta Comer POUR, NP  CC:  Chief Complaint  Patient presents with   Shortness of Breath    Ct chest result. Patient states no improvement in cough.    HPI Jamie Patterson is a 64 y.o. female who used to follow Dr Meade and is here to establish care with me. She is a former tobacco use disorder (quit 1997) and recurrent bronchitis and pneumonia.  Hospitalized for hemoptysis in 2023 and had a bronchoscopy negative for DAH. Cultures grew few haemophilus influenza and MRSA, AFB negative. Hospitalized again in July 2025 for MRSA pneumonia. She also has history of MRSA sinus infections and has seen an ENT in [place] in the past.  Normal IgG/IgM, IgA  Discussed the use of AI scribe software for clinical note transcription with the patient, who gave verbal consent to proceed.  History of Present Illness [name] is a 63 year old female with recurrent pneumonia who presents with chronic cough and shortness of breath.  She has had a chronic cough for approximately seven years, characterized by persistent coughing spells, particularly at night. The sputum is brownish-green, and she produces a significant amount, necessitating the use of paper cups for disposal. Previous treatment with Protonix  did not yield improvement.  She experiences shortness of breath, sometimes occurring without exertion. She uses albuterol  as needed, though its effectiveness is uncertain. She has also tried [employer], which she reports did not make a difference, and has a nebulizer machine at home.  Her medical history includes multiple episodes of pneumonia (she reports 5) since 2020. She has undergone bronchoscopy in the past due to hemoptysis. Additionally, she has a history of  MRSA infections, requiring prolonged antibiotic courses.   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 She lives with her husband and frequently attends her three grandchildren's sports activities. No known heart conditions per patient report. She reports sinus drainage.    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   Trimalleolar fracture on right s/p fusion     Benign essential tremor    worsened by anxiety   CAP (community acquired pneumonia) 03/10/2024   Cholelithiasis 03/10/2024   Chronic pain due to injury    at R ankle due to ankle fracture   Depression    followed by psych   GERD (gastroesophageal reflux disease)    Hemoptysis 03/03/2022   History of hypertension    HLD (hyperlipidemia)    Hypertension    Imbalance    likely multifactorial (s/p balance training at [facility] 08/2010) ?CMT   Influenza A 09/15/2018   Iron deficiency anemia    LLL pneumonia 10/05/2018   MRSA pneumonia (HCC) 02/2024   Pre-diabetes    Right ankle pain 2003   trimalleolar fx s/p fusion   Seizure (HCC)    focal, ex vacuo ventriculomegaly?, pending neuro w/u with sleep deprived EEG   Syncope and collapse 08/19/2011   Past Surgical History:  Procedure Laterality Date   ANKLE FUSION  02/2004   Right-50% disability    BRONCHIAL WASHINGS  03/04/2022   Procedure: BRONCHIAL  WASHINGS;  Surgeon: Geronimo Amel, MD;  Location: [facility];  Service: Endoscopy;;   CARPAL TUNNEL RELEASE  12/2017   L CTR repair, R thumb A1 pulley release (Gramig)   CESAREAN SECTION     HAMMER TOE SURGERY  09/12/2019   hospitalization  08/2011   syncope thought 2/2 seizure, MRI - Hyperintensity in the cerebral white matter bilaterally is nonspecific, started on Keppra    ORIF ANKLE FRACTURE  06/2003   Right   VIDEO BRONCHOSCOPY  Bilateral 03/04/2022   Procedure: VIDEO BRONCHOSCOPY WITHOUT FLUORO;  Surgeon: Geronimo Amel, MD;  Location: [facility];  Service: Endoscopy;  Laterality: Bilateral;   Family History  Problem Relation Age of Onset   Depression Mother    Arrhythmia Mother 67       Dx with VTach after having sudden collapse, did NOT have h/o AMI's    Atrial fibrillation Father    Hypertension Father    Osteoarthritis Father    Aortic aneurysm Father    Hepatitis Brother        Hep. C   Anemia Sister    Social History   Socioeconomic History   Marital status: Married    Spouse name: Not on file   Number of children: 1   Years of education: Not on file   Highest education level: Not on file  Occupational History   Occupation: Disabled    Comment: depression   Occupation: DISABILITY    Employer: UNEMPLOYED  Tobacco Use   Smoking status: Former    Current packs/day: 0.00    Average packs/day: 1 pack/day for 20.0 years (20.0 ttl pk-yrs)    Types: Cigarettes    Start date: 08/12/1975    Quit date: 08/12/1995    Years since quitting: 29.0   Smokeless tobacco: Never  Vaping Use   Vaping status: Never Used  Substance and Sexual Activity   Alcohol use: No    Alcohol/week: 0.0 standard drinks of alcohol    Comment: occassinal   Drug use: No   Sexual activity: Not on file  Other Topics Concern   Not on file  Social History Narrative   No caffeine. Divorced. 1 child. 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 Has a cane but hasn't needed it recently, although endorses that she falls a lot    Social Drivers of Health   Tobacco Use: Medium Risk (08/25/2024)   Patient History    Smoking Tobacco Use: Former    Smokeless Tobacco Use: Never    Passive Exposure: Not on file  Financial Resource Strain: Low Risk (06/08/2024)   Overall Financial Resource Strain (CARDIA)    Difficulty of Paying Living Expenses: Not hard at all  Food Insecurity: No Food Insecurity (06/08/2024)    Epic    Worried About Programme Researcher, Broadcasting/film/video in the Last Year: Never true    Ran Out of Food in the Last Year: Never true  Transportation Needs: No Transportation Needs (06/08/2024)   Epic    Lack of Transportation (Medical): No    Lack of Transportation (Non-Medical): No  Physical Activity: Inactive (06/08/2024)   Exercise Vital Sign    Days of Exercise per Week: 0 days    Minutes of Exercise per Session: 0 min  Stress: No Stress Concern Present (06/08/2024)   [employer] of Occupational Health - Occupational Stress Questionnaire    Feeling of Stress: Only a little  Social Connections: Moderately Isolated (06/08/2024)   Social Connection and Isolation Panel  Frequency of Communication with Friends and Family: More than three times a week    Frequency of Social Gatherings with Friends and Family: More than three times a week    Attends Religious Services: Never    Database Administrator or Organizations: No    Attends Banker Meetings: Never    Marital Status: Married  Catering Manager Violence: Not At Risk (06/08/2024)   Epic    Fear of Current or Ex-Partner: No    Emotionally Abused: No    Physically Abused: No    Sexually Abused: No  Depression (PHQ2-9): Low Risk (07/20/2024)   Depression (PHQ2-9)    PHQ-2 Score: 0  Recent Concern: Depression (PHQ2-9) - Medium Risk (06/08/2024)   Depression (PHQ2-9)    PHQ-2 Score: 5  Alcohol Screen: Low Risk (06/08/2024)   Alcohol Screen    Last Alcohol Screening Score (AUDIT): 0  Housing: Low Risk (06/08/2024)   Epic    Unable to Pay for Housing in the Last Year: No    Number of Times Moved in the Last Year: 0    Homeless in the Last Year: No  Utilities: Not At Risk (06/08/2024)   Epic    Threatened with loss of utilities: No  Health Literacy: Adequate Health Literacy (06/08/2024)   B1300 Health Literacy    Frequency of need for help with medical instructions: Never         Objective:  BP 134/73   Pulse  82   Temp 98.1 F (36.7 C) (Oral)   Ht 5' 4 (1.626 m)   Wt 229 lb (103.9 kg)   LMP 06/01/2013   SpO2 96%   BMI 39.31 kg/m    Physical Exam Constitutional:      General: She is not in acute distress.    Appearance: Normal appearance.     Comments: Constant coughing.  HENT:     Mouth/Throat:     Mouth: Mucous membranes are moist.  Cardiovascular:     Rate and Rhythm: Normal rate.  Pulmonary:     Effort: No respiratory distress.     Breath sounds: No wheezing or rales.  Musculoskeletal:     Right lower leg: No edema.     Left lower leg: No edema.  Skin:    General: Skin is warm.  Neurological:     Mental Status: She is alert and oriented to person, place, and time.  Psychiatric:        Mood and Affect: Mood normal.     Diagnostic Review:    Pft    Latest Ref Rng & Units 01/14/2021    9:56 AM  PFT Results  FVC-Pre L 2.90   FVC-Predicted Pre % 78   FVC-Post L 2.72   FVC-Predicted Post % 73   Pre FEV1/FVC % % 72   Post FEV1/FCV % % 71   FEV1-Pre L 2.09   FEV1-Predicted Pre % 72   FEV1-Post L 1.94   DLCO uncorrected ml/min/mmHg 20.62   DLCO UNC% % 91   DLCO corrected ml/min/mmHg 20.62   DLCO COR %Predicted % 91   DLVA Predicted % 98   TLC L 5.80   TLC % Predicted % 105   RV % Predicted % 123        Reviewed CT chest from January 06/2025 and compared with CT chest July/2025 It shows improving right middle lobe consolidation, however lateral segment of right middle lobe is still completely obstructed with distal consolidation.  This remains  concerning for central lesion causing postobstructive changes versus chronic consolidation/scarring. CT also shows persistent mild symmetric bronchiectasis and treating but nodularity Left lower lobe shows distal nodule vs airway impaction and tree-in-bud nodularity Shows mild peripheral pulmonary fibrotic changes  The above concerning findings were not present on CT chest July/2023.   Reviewed prior pulmonary notes  from this clinic  Reviewed prior pulmonary function test  ECHO 10/2023  1. Left ventricular ejection fraction, by estimation, is 55 to 60%. Left  ventricular ejection fraction by 3D volume is 55 %. The left ventricle has  normal function. The left ventricle has no regional wall motion  abnormalities. Indeterminate diastolic  filling due to E-A fusion. The average left ventricular global  longitudinal strain is -18.9 %. The global longitudinal strain is normal.   2. Right ventricular systolic function is normal. The right ventricular  size is normal. Tricuspid regurgitation signal is inadequate for assessing  PA pressure.   3. The mitral valve is normal in structure. Trivial mitral valve  regurgitation. No evidence of mitral stenosis.   4. The aortic valve is tricuspid. Aortic valve regurgitation is trivial.  No aortic stenosis is present.   5. The inferior vena cava is normal in size with greater than 50%  respiratory variability, suggesting right atrial pressure of 3 mmHg.   Results        Assessment & Plan:   Assessment & Plan Nodule of middle lobe of right lung Reviewed CT chest from January 06/2025 and compared with CT chest July/2025 It shows improving right middle lobe consolidation, however lateral segment of right middle lobe is still completely obstructed with distal consolidation.  This remains concerning for central lesion causing postobstructive changes versus chronic consolidation/scarring. Will plan for bronchoscopy and biopsy of the right middle lobe nodule I explained the bronchoscopic biopsy procedure at depth.  We discussed about other potential options including continued follow up, CT guided biopsy, surgical biopsy. Risks and benefits of bronchoscopy +/- biopsy discussed with the patient and his family in details and all the questions were answered. They understand the risk of bleeding, pneumothorax, injury to blood vessels and would like to proceed for the  bronchoscopy with biopsy.  Will set up bronchoscopy with biopsy Orders:   Procedural/ Surgical Case Request: VIDEO BRONCHOSCOPY WITH ENDOBRONCHIAL NAVIGATION; Future   CT SUPER D CHEST WO CONTRAST; Future  Nodule of lower lobe of left lung This is new/worsening compared to last CT chest.  Could be inflammatory Will plan to obtain some biopsies and blood biopsies from this nodule as well Need to rule out fungal infection, atypical infections likely MAC/o  Orders:   Procedural/ Surgical Case Request: VIDEO BRONCHOSCOPY WITH ENDOBRONCHIAL NAVIGATION; Future   CT SUPER D CHEST WO CONTRAST; Future  Wheezing Patient has ongoing coughing and wheezing Will treat with a course of steroids and antibiotics Orders:   predniSONE  (DELTASONE ) 10 MG tablet; Take 4 tablets a day for 2 days, 3 tab a day for 2 days, 2 tab a day for 2 days and 1 tab a day for 1 day and stop.   doxycycline  (VIBRA -TABS) 100 MG tablet; Take 1 tablet (100 mg total) by mouth 2 (two) times daily.  Simple chronic bronchitis (HCC) Will need updated PFT and next visit Needs     Obesity, morbid (HCC) Recommend weight loss with exercise and diet control     Pneumonia of right lung due to methicillin resistant Staphylococcus aureus (MRSA), unspecified part of lung (HCC) History of MRSA pneumonia.  Treated appropriately in the past    Bronchiectasis without complication (HCC) Ig panel normal Continue albuterol  for now Didn't respond to stiolto in past       Thank you for the opportunity to take part in the care of Ioana C Tow   Return in about 6 weeks (around 10/03/2024).   Lezlie Ritchey Pleas, MD [facility] Office: [phone]  I personally spent a total of 40 minutes in the care of the patient today including getting/reviewing separately obtained history, performing a medically appropriate exam/evaluation, counseling and educating, referring and communicating with other health care professionals,  documenting clinical information in the EHR, independently interpreting results, and communicating results.     [1]  Current Outpatient Medications:    albuterol  (VENTOLIN  HFA) 108 (90 Base) MCG/ACT inhaler, INHALE TWO PUFFS INTO THE LUNGS EVERY SIX HOURS AS NEEDED FOR WHEEZING OR SHORTNESS OF BREATH., Disp: 8.5 g, Rfl: 0   Ascorbic Acid (VITAMIN C) 1000 MG tablet, Take 1,000 mg by mouth daily., Disp: , Rfl:    carvedilol  (COREG ) 25 MG tablet, TAKE 1 TABLET (25 MG TOTAL) BY MOUTH 2 TIMES DAILY WITH A MEAL., Disp: 180 tablet, Rfl: 1   cholecalciferol  (VITAMIN D3) 25 MCG (1000 UNIT) tablet, Take 1,000 Units by mouth daily., Disp: , Rfl:    cycloSPORINE  (RESTASIS ) 0.05 % ophthalmic emulsion, Place 1 drop into both eyes 2 (two) times daily., Disp: , Rfl:    doxycycline  (VIBRA -TABS) 100 MG tablet, Take 1 tablet (100 mg total) by mouth 2 (two) times daily., Disp: 14 tablet, Rfl: 0   DULoxetine  (CYMBALTA ) 60 MG capsule, Take 60 mg by mouth 2 (two) times daily. , Disp: , Rfl:    escitalopram  (LEXAPRO ) 10 MG tablet, Take 10 mg by mouth daily. , Disp: , Rfl:    gabapentin  (NEURONTIN ) 300 MG capsule, Take 300-600 mg by mouth See admin instructions. Take 300 mg by mouth in the morning and afternoon and take 600 mg at night, Disp: , Rfl:    ibuprofen (ADVIL) 200 MG tablet, Take 200-800 mg by mouth every 6 (six) hours as needed for moderate pain (pain score 4-6)., Disp: , Rfl:    ipratropium-albuterol  (DUONEB) 0.5-2.5 (3) MG/3ML SOLN, Take 3 mLs by nebulization every 6 (six) hours as needed., Disp: 360 mL, Rfl: 1   lamoTRIgine  (LAMICTAL ) 150 MG tablet, TAKE 1 TABLET BY MOUTH TWICE A DAY, Disp: 180 tablet, Rfl: 1   levothyroxine  (SYNTHROID ) 50 MCG tablet, TAKE ONE TABLET BY MOUTH ONCE A DAY. TAKE ON AN EMPTY STOMACH WITH A GLASS OF WATER ATLEAST 30-60 MIN BEFORE BREAKFAST, Disp: 90 tablet, Rfl: 2   MAGNESIUM PO, Take 400 mg by mouth 3 (three) times daily. , Disp: , Rfl:    Melatonin 10 MG TABS, Take 20 mg by  mouth at bedtime., Disp: , Rfl:    Multiple Vitamin (MULTIVITAMIN) tablet, Take 1 tablet by mouth daily., Disp: , Rfl:    olmesartan  (BENICAR ) 40 MG tablet, TAKE ONE TABLET (40 MG TOTAL) BY MOUTH DAILY. FOR BLOOD PRESSURE., Disp: 90 tablet, Rfl: 2   predniSONE  (DELTASONE ) 10 MG tablet, Take 4 tablets a day for 2 days, 3 tab a day for 2 days, 2 tab a day for 2 days and 1 tab a day for 1 day and stop., Disp: 19 tablet, Rfl: 0   primidone  (MYSOLINE ) 50 MG tablet, Take 1 tablet (50 mg total) by mouth 2 (two) times daily., Disp: 180 tablet, Rfl: 3   rosuvastatin  (CRESTOR ) 40  MG tablet, Take 1 tablet (40 mg total) by mouth daily., Disp: 90 tablet, Rfl: 3   tiZANidine  (ZANAFLEX ) 4 MG tablet, Take 8 mg by mouth at bedtime., Disp: , Rfl:    vitamin B-12 (CYANOCOBALAMIN ) 1000 MCG tablet, Take 1,000 mcg by mouth daily., Disp: , Rfl:   "

## 2024-08-30 NOTE — Telephone Encounter (Signed)
 We don't need any new CT chest. I have confirmed with endo, we can use the December one. If you need to, please cancel the new orders. She doesn't have to get it done anymore.

## 2024-08-30 NOTE — Telephone Encounter (Signed)
 Copied from CRM [id number]. Topic: Clinical - Request for Lab/Test Order >> Aug 29, 2024 12:11 PM [name] wrote: Reason for CRM: Patient states when she was seen by Dr. Pleas on 1/15 she was under the impression that she wouldn't need another CT scan prior to her bronchoscopy as she just had a CT completed on 12/30 and was advised that one would suffice. Please call patient back to advise as she os concerned that her insurance will not cover another CT so close together. But she did received a call from the imaging department to schedule this, which has left patient confused.   Pt already notified via MyChart msg, NFN.

## 2024-09-14 ENCOUNTER — Encounter (HOSPITAL_COMMUNITY): Payer: Self-pay

## 2024-09-14 ENCOUNTER — Other Ambulatory Visit: Payer: Self-pay

## 2024-09-14 NOTE — Progress Notes (Signed)
 SDW CALL  Patient was given pre-op instructions over the phone. The opportunity was given for the patient to ask questions. No further questions asked. Patient verbalized understanding of instructions given.   PCP - Comer Gretta PIETY Cardiologist - Peter,Nishan,MD-referred to Dr. Nishan for BP management and risk for CAD. LOV with APP was 12/22/23. 3 month follow up was recommended,but patient says ,PCP is now managing her BP.  PPM/ICD - denies Device Orders -  Rep Notified -   Chest x-ray - 07/24/24 EKG - 03/02/24 Stress Test - denies ECHO - 10/29/23 Cardiac Cath - denies  Sleep Study - denies CPAP -   Fasting Blood Sugar - na Checks Blood Sugar _____ times a day  Blood Thinner Instructions:Na Aspirin  Instructions:na  ERAS Protcol -NPO PRE-SURGERY Ensure or G2-   COVID TEST- na   Anesthesia review: yes- pt treated with Prednisone  and Doxycycline  for coughing and wheezing after visit with Dr. Pleas on 08/25/24. Patient completed meds . She is still coughing and is hoarse but she says she has been coughing for years. She denies fevers, body aches,chills.   Patient denies shortness of breath, fever, and chest pain over the phone call   As of today, STOP taking any Aspirin  (unless otherwise instructed by your surgeon) Aleve , Naproxen , Ibuprofen, Motrin, Advil, Goody's, BC's, all herbal medications, fish oil, and all vitamins.  Oral Hygiene is also important to reduce your risk of infection.  Remember - BRUSH YOUR TEETH THE MORNING OF SURGERY WITH YOUR REGULAR TOOTHPASTE

## 2024-09-15 ENCOUNTER — Other Ambulatory Visit: Payer: Self-pay

## 2024-09-15 ENCOUNTER — Ambulatory Visit (HOSPITAL_COMMUNITY): Admission: RE | Admit: 2024-09-15 | Discharge: 2024-09-15 | Disposition: A | Source: Home / Self Care

## 2024-09-15 ENCOUNTER — Ambulatory Visit (HOSPITAL_COMMUNITY)

## 2024-09-15 ENCOUNTER — Ambulatory Visit (HOSPITAL_COMMUNITY): Admitting: Anesthesiology

## 2024-09-15 ENCOUNTER — Encounter (HOSPITAL_COMMUNITY): Admission: RE | Disposition: A | Payer: Self-pay | Source: Home / Self Care

## 2024-09-15 ENCOUNTER — Telehealth: Payer: Self-pay

## 2024-09-15 ENCOUNTER — Encounter (HOSPITAL_COMMUNITY): Payer: Self-pay

## 2024-09-15 DIAGNOSIS — R911 Solitary pulmonary nodule: Secondary | ICD-10-CM | POA: Diagnosis present

## 2024-09-15 DIAGNOSIS — R918 Other nonspecific abnormal finding of lung field: Secondary | ICD-10-CM | POA: Diagnosis not present

## 2024-09-15 DIAGNOSIS — J479 Bronchiectasis, uncomplicated: Secondary | ICD-10-CM

## 2024-09-15 HISTORY — DX: Dyspnea, unspecified: R06.00

## 2024-09-15 HISTORY — DX: Anxiety disorder, unspecified: F41.9

## 2024-09-15 HISTORY — DX: Hypothyroidism, unspecified: E03.9

## 2024-09-15 LAB — CBC
HCT: 38.8 % (ref 36.0–46.0)
Hemoglobin: 12.8 g/dL (ref 12.0–15.0)
MCH: 32.4 pg (ref 26.0–34.0)
MCHC: 33 g/dL (ref 30.0–36.0)
MCV: 98.2 fL (ref 80.0–100.0)
Platelets: 268 10*3/uL (ref 150–400)
RBC: 3.95 MIL/uL (ref 3.87–5.11)
RDW: 12.8 % (ref 11.5–15.5)
WBC: 5.9 10*3/uL (ref 4.0–10.5)
nRBC: 0 % (ref 0.0–0.2)

## 2024-09-15 LAB — PROTIME-INR
INR: 1 (ref 0.8–1.2)
Prothrombin Time: 13.6 s (ref 11.4–15.2)

## 2024-09-15 LAB — BODY FLUID CELL COUNT WITH DIFFERENTIAL
Eos, Fluid: 1 %
Eos, Fluid: 2 %
Lymphs, Fluid: 3 %
Lymphs, Fluid: 7 %
Monocyte-Macrophage-Serous Fluid: 0 % — ABNORMAL LOW (ref 50–90)
Monocyte-Macrophage-Serous Fluid: 1 % — ABNORMAL LOW (ref 50–90)
Neutrophil Count, Fluid: 90 % — ABNORMAL HIGH (ref 0–25)
Neutrophil Count, Fluid: 96 % — ABNORMAL HIGH (ref 0–25)
Total Nucleated Cell Count, Fluid: 1695 uL — ABNORMAL HIGH (ref 0–1000)
Total Nucleated Cell Count, Fluid: 3400 uL — ABNORMAL HIGH (ref 0–1000)

## 2024-09-15 LAB — APTT: aPTT: 34 s (ref 24–36)

## 2024-09-15 MED ORDER — CHLORHEXIDINE GLUCONATE 0.12 % MT SOLN
OROMUCOSAL | Status: AC
  Administered 2024-09-15: 15 mL via OROMUCOSAL
  Filled 2024-09-15: qty 15

## 2024-09-15 MED ORDER — LACTATED RINGERS IV SOLN: INTRAVENOUS | Status: DC

## 2024-09-15 MED ORDER — OXYCODONE HCL 5 MG PO TABS: 5.0000 mg | ORAL_TABLET | Freq: Once | ORAL | Status: DC | PRN

## 2024-09-15 MED ORDER — ROCURONIUM BROMIDE 10 MG/ML (PF) SYRINGE
PREFILLED_SYRINGE | INTRAVENOUS | Status: DC | PRN
  Administered 2024-09-15: 20 mg via INTRAVENOUS
  Administered 2024-09-15: 50 mg via INTRAVENOUS

## 2024-09-15 MED ORDER — OXYCODONE HCL 5 MG/5ML PO SOLN: 5.0000 mg | Freq: Once | ORAL | Status: DC | PRN

## 2024-09-15 MED ORDER — ONDANSETRON HCL 4 MG/2ML IJ SOLN
INTRAMUSCULAR | Status: DC | PRN
  Administered 2024-09-15: 4 mg via INTRAVENOUS

## 2024-09-15 MED ORDER — SODIUM CHLORIDE 0.9 % IV SOLN
12.5000 mg | INTRAVENOUS | Status: DC | PRN
  Filled 2024-09-15: qty 0.5

## 2024-09-15 MED ORDER — LEVOFLOXACIN 750 MG PO TABS: 750.0000 mg | ORAL_TABLET | Freq: Every day | ORAL | 0 refills | Status: AC

## 2024-09-15 MED ORDER — AMISULPRIDE (ANTIEMETIC) 5 MG/2ML IV SOLN: 10.0000 mg | Freq: Once | INTRAVENOUS | Status: DC | PRN

## 2024-09-15 MED ORDER — LEVOFLOXACIN 750 MG PO TABS: 750.0000 mg | ORAL_TABLET | Freq: Every day | ORAL | Status: DC

## 2024-09-15 MED ORDER — IPRATROPIUM-ALBUTEROL 0.5-2.5 (3) MG/3ML IN SOLN: 3.0000 mL | Freq: Once | RESPIRATORY_TRACT | Status: DC

## 2024-09-15 MED ORDER — ACETAMINOPHEN 500 MG PO TABS
1000.0000 mg | ORAL_TABLET | Freq: Once | ORAL | Status: AC
  Administered 2024-09-15: 1000 mg via ORAL
  Filled 2024-09-15: qty 2

## 2024-09-15 MED ORDER — PROPOFOL 500 MG/50ML IV EMUL
INTRAVENOUS | Status: DC | PRN
  Administered 2024-09-15: 125 ug/kg/min via INTRAVENOUS

## 2024-09-15 MED ORDER — LIDOCAINE 2% (20 MG/ML) 5 ML SYRINGE
INTRAMUSCULAR | Status: DC | PRN
  Administered 2024-09-15: 60 mg via INTRAVENOUS

## 2024-09-15 MED ORDER — FENTANYL CITRATE (PF) 100 MCG/2ML IJ SOLN: 25.0000 ug | INTRAMUSCULAR | Status: DC | PRN

## 2024-09-15 MED ORDER — IPRATROPIUM-ALBUTEROL 0.5-2.5 (3) MG/3ML IN SOLN
RESPIRATORY_TRACT | Status: AC
  Filled 2024-09-15: qty 3

## 2024-09-15 MED ORDER — CHLORHEXIDINE GLUCONATE 0.12 % MT SOLN: 15.0000 mL | Freq: Once | OROMUCOSAL | Status: AC

## 2024-09-15 MED ORDER — STIOLTO RESPIMAT 2.5-2.5 MCG/ACT IN AERS: 2.0000 | INHALATION_SPRAY | Freq: Every day | RESPIRATORY_TRACT | Status: DC

## 2024-09-15 MED ORDER — SUGAMMADEX SODIUM 200 MG/2ML IV SOLN
INTRAVENOUS | Status: DC | PRN
  Administered 2024-09-15: 200 mg via INTRAVENOUS

## 2024-09-15 MED ORDER — LABETALOL HCL 5 MG/ML IV SOLN
INTRAVENOUS | Status: DC | PRN
  Administered 2024-09-15: 10 mg via INTRAVENOUS

## 2024-09-15 MED ORDER — TIOTROPIUM BROMIDE-OLODATEROL 2.5-2.5 MCG/ACT IN AERS: 2.0000 | INHALATION_SPRAY | Freq: Every day | RESPIRATORY_TRACT | 0 refills | Status: AC

## 2024-09-15 MED ORDER — IPRATROPIUM-ALBUTEROL 0.5-2.5 (3) MG/3ML IN SOLN
3.0000 mL | Freq: Once | RESPIRATORY_TRACT | Status: AC
  Administered 2024-09-15: 3 mL via RESPIRATORY_TRACT

## 2024-09-15 MED ORDER — PROPOFOL 10 MG/ML IV BOLUS
INTRAVENOUS | Status: DC | PRN
  Administered 2024-09-15: 200 mg via INTRAVENOUS

## 2024-09-15 MED ORDER — DEXAMETHASONE SOD PHOSPHATE PF 10 MG/ML IJ SOLN
INTRAMUSCULAR | Status: DC | PRN
  Administered 2024-09-15: 10 mg via INTRAVENOUS

## 2024-09-15 NOTE — Telephone Encounter (Signed)
 Copious mucous and inflamed looking airways on bronchoscopic visualization. Will prescribe 14 days levoquin (husband reports no allergies/A/E) and resume stiolto. Will hold off on PO steroids. Use duonebs TID. Will prescribe flutter device to be used 2-3 times a day after nebulizer use.   Please advise pt to use flutter device after nebulizer use- twice a day to help cough out sputum. And to call us  if he has issues obtaining stiolto, levofloxacin  or flutter device.

## 2024-09-15 NOTE — Anesthesia Postprocedure Evaluation (Signed)
"   Anesthesia Post Note  Patient: Jamie Patterson  Procedure(s) Performed: VIDEO BRONCHOSCOPY WITH ENDOBRONCHIAL NAVIGATION (Bilateral)     Patient location during evaluation: PACU Anesthesia Type: General Level of consciousness: awake and alert Pain management: pain level controlled Vital Signs Assessment: post-procedure vital signs reviewed and stable Respiratory status: spontaneous breathing, nonlabored ventilation, respiratory function stable and patient connected to nasal cannula oxygen Cardiovascular status: blood pressure returned to baseline and stable Postop Assessment: no apparent nausea or vomiting Anesthetic complications: no   There were no known notable events for this encounter.  Last Vitals:  Vitals:   09/15/24 1500 09/15/24 1515  BP: 127/89 130/89  Pulse: 86 88  Resp: 15 17  Temp:  37 C  SpO2: 96% 93%    Last Pain:  Vitals:   09/15/24 1515  TempSrc:   PainSc: 0-No pain                 Garnette LABOR [name]      "

## 2024-09-15 NOTE — Transfer of Care (Signed)
 Immediate Anesthesia Transfer of Care Note  Patient: Jamie Patterson  Procedure(s) Performed: VIDEO BRONCHOSCOPY WITH ENDOBRONCHIAL NAVIGATION (Bilateral)  Patient Location: PACU  Anesthesia Type:General  Level of Consciousness: awake, alert , and oriented  Airway & Oxygen Therapy: Patient Spontanous Breathing and Patient connected to face mask oxygen  Post-op Assessment: Report given to RN and Post -op Vital signs reviewed and stable  Post vital signs: Reviewed and stable  Last Vitals:  Vitals Value Taken Time  BP 131/76 09/15/24 13:39  Temp 37.1 C 09/15/24 13:39  Pulse 90 09/15/24 13:41  Resp 12 09/15/24 13:41  SpO2 97 % 09/15/24 13:41  Vitals shown include unfiled device data.  Last Pain:  Vitals:   09/15/24 1132  TempSrc:   PainSc: 0-No pain      Patients Stated Pain Goal: 0 (09/15/24 1049)  Complications: There were no known notable events for this encounter.

## 2024-09-15 NOTE — Addendum Note (Signed)
 Addended by: Kyrah Schiro on: 09/15/2024 04:09 PM   Modules accepted: Orders

## 2024-09-15 NOTE — Anesthesia Procedure Notes (Signed)
 Procedure Name: Intubation Date/Time: 09/15/2024 12:09 PM  Performed by: Alen Motto D, CRNAPre-anesthesia Checklist: Patient identified, Emergency Drugs available, Suction available and Patient being monitored Patient Re-evaluated:Patient Re-evaluated prior to induction Oxygen Delivery Method: Circle System Utilized Preoxygenation: Pre-oxygenation with 100% oxygen Induction Type: IV induction Ventilation: Mask ventilation without difficulty Laryngoscope Size: Mac and 3 Grade View: Grade I Tube type: Oral Tube size: 8.0 mm Number of attempts: 1 Airway Equipment and Method: Stylet and Oral airway Placement Confirmation: ETT inserted through vocal cords under direct vision, positive ETCO2 and breath sounds checked- equal and bilateral Secured at: 22 cm Tube secured with: Tape Dental Injury: Teeth and Oropharynx as per pre-operative assessment

## 2024-09-15 NOTE — Discharge Instructions (Addendum)

## 2024-09-15 NOTE — Op Note (Addendum)
 Video Bronchoscopy with Endobronchial Ultrasound and Electromagnetic Navigation Procedure Note  Date of Operation: 09/15/2024  Pre-op Diagnosis: right and left lung nodule, chronic cough  Post-op Diagnosis: right and left lung nodule  Surgeon:Carleton Vanvalkenburgh Pleas, MD  Assistants: Zola Herter  Anesthesia: General endotracheal anesthesia  Operation: Flexible video fiberoptic bronchoscopy with endobronchial ultrasound, robotic assisted navigation and biopsies.  Estimated Blood Loss: Minimal  Complications: none  Indications and History: Jamie Patterson is a 64 y.o. female with history of MRSA pneumonia, persistent cough, persistent right middle lobe nodule and left lung nodule.  Recommendation was made to achieve a tissue diagnosis using endobronchial ultrasound and robotic assisted navigational bronchoscopy. The risks, benefits, complications, treatment options and expected outcomes were discussed with the patient.  The possibilities of pneumothorax, pneumonia, reaction to medication, pulmonary aspiration, perforation of a viscus, bleeding, failure to diagnose a condition and creating a complication requiring transfusion or operation were discussed with the patient who freely signed the consent.    Description of Procedure: The patient was seen in the Preoperative Area, was examined and was deemed appropriate to proceed.  The patient was taken to [facility] room 3, identified as Jamie Patterson and the procedure verified as Flexible Video Fiberoptic Bronchoscopy with robotic assisted navigation and endobronchial ultrasound.  A Time Out was held and the above information confirmed.   Robotic assisted navigation: Prior to the date of the procedure a high-resolution CT scan of the chest was performed. Utilizing ION software program a virtual tracheobronchial tree was generated to allow the creation of distinct navigation pathways to the patient's parenchymal abnormalities. After being taken to the operating  room general anesthesia was initiated and the patient  was orally intubated. The video fiberoptic bronchoscope was introduced via the endotracheal tube and a general inspection was performed which showed normal right and left lung anatomy. Copious secretions and inflamed looking airways were visualized. The right middle lobe airways was narrow with swollen. Aspiration of the bilateral mainstems was completed to remove any remaining secretions. Robotic catheter inserted into patient's endotracheal tube.   Target #1 right middle lobe mass: The distinct navigation pathways prepared prior to this procedure were then utilized to navigate to patient's lesion identified on CT scan. The robotic catheter was secured into place and the vision probe was withdrawn.  Lesion location was approximated using fluoroscopy.  Local registration and targeting was performed using [employer] Cios mobile C-arm three-dimensional imaging. Radial EBUS was used to confirm the location and concentric view of the mass was obtained. Needle-in-lesion was confirmed using Cios mobile C-arm. Under fluoroscopic guidance transbronchial needle brushings, transbronchial needle biopsies, and transbronchial forceps biopsies were performed with reconfirmation with radial EBUS inbetween, to be sent for cytology and pathology.   A bronchioalveolar lavage was performed in the right middle lobe  and sent for cytology and microbiology.  Under fluoroscopic guidance a single fiducial marker was placed adjacent to the nodule.  Following this robotic bronchoscope was removed and BAL was performed in right middle lobe and left lower lobe with 90 cc saline and return of about 20 cc in each.  Samples Target #1: 1. Transbronchial needle brushings from right middle lobe mass- 1 pass 2. Transbronchial Wang needle biopsies from right middle lobe mass- 3 passes 3. Transbronchial forceps biopsies from right middle lobe mass- 6 passes 4. Bronchoalveolar  lavage from right middle lobe and left lower lobe     [name], MD 09/15/2024, 2:41 PM [facility]

## 2024-09-15 NOTE — Anesthesia Preprocedure Evaluation (Addendum)
"                                    Anesthesia Evaluation  Patient identified by MRN, date of birth, ID band Patient awake    Reviewed: Allergy  & Precautions, NPO status , Patient's Chart, lab work & pertinent test results, reviewed documented beta blocker date and time   History of Anesthesia Complications Negative for: history of anesthetic complications  Airway Mallampati: II  TM Distance: >3 FB Neck ROM: Full    Dental  (+) Dental Advisory Given, Teeth Intact   Pulmonary former smoker  Lung nodule    Pulmonary exam normal        Cardiovascular hypertension, Pt. on home beta blockers and Pt. on medications Normal cardiovascular exam   '25 TTE - EF 55 to 60%. Indeterminate diastolic filling due to E-A fusion. Trivial mitral valve regurgitation. Aortic valve regurgitation is trivial.     Neuro/Psych Seizures -, Well Controlled,  PSYCHIATRIC DISORDERS Anxiety Depression       GI/Hepatic Neg liver ROS,GERD  Controlled,,  Endo/Other  Hypothyroidism   Obesity Pre-DM   Renal/GU negative Renal ROS     Musculoskeletal negative musculoskeletal ROS (+)    Abdominal   Peds  Hematology negative hematology ROS (+)   Anesthesia Other Findings Chronic pain   Reproductive/Obstetrics                              Anesthesia Physical Anesthesia Plan  ASA: 3  Anesthesia Plan: General   Post-op Pain Management: Tylenol  PO (pre-op)* and Minimal or no pain anticipated   Induction: Intravenous  PONV Risk Score and Plan: 3 and Treatment may vary due to age or medical condition, Ondansetron  and Dexamethasone   Airway Management Planned: Oral ETT  Additional Equipment: None  Intra-op Plan:   Post-operative Plan: Extubation in OR  Informed Consent: I have reviewed the patients History and Physical, chart, labs and discussed the procedure including the risks, benefits and alternatives for the proposed anesthesia with the patient  or authorized representative who has indicated his/her understanding and acceptance.     Dental advisory given  Plan Discussed with: CRNA and Anesthesiologist  Anesthesia Plan Comments:          Anesthesia Quick Evaluation  "

## 2024-09-15 NOTE — Interval H&P Note (Signed)
 History and Physical Interval Note:  09/15/2024 11:49 AM  Jamie Patterson  has presented today for surgery, with the diagnosis of lung nodules.  The various methods of treatment have been discussed with the patient and family. After consideration of risks, benefits and other options for treatment, the patient has consented to  Procedures: VIDEO BRONCHOSCOPY WITH ENDOBRONCHIAL NAVIGATION (Bilateral) as a surgical intervention.  The patient's history has been reviewed, patient examined, no change in status, stable for surgery.  I have reviewed the patient's chart and labs.  Questions were answered to the patient's satisfaction.     Duha Abair Pleas

## 2024-09-15 NOTE — Op Note (Signed)
 Procedure Note  Patient: Jamie Patterson  [employer] Cios mobile C-arm was utilized to identify and biopsy of right middle lobe mass Needle-in-lesion was confirmed using real-time Cios imaging, and images were uploaded to PACS.      Jamie Mccrae Pleas, MD 09/15/2024, 2:38 PM [facility] EPIC CHAT or if no answer before 7:00PM call [phone] For any issues after 7:00PM please call eLink [phone]

## 2024-09-16 LAB — ACID FAST SMEAR (AFB, MYCOBACTERIA)
Acid Fast Smear: NEGATIVE
Acid Fast Smear: NEGATIVE

## 2024-09-16 LAB — CULTURE, BAL-QUANTITATIVE W GRAM STAIN

## 2024-09-16 NOTE — Addendum Note (Signed)
 Addended by: Kashvi Prevette T on: 09/16/2024 01:12 PM   Modules accepted: Orders

## 2024-09-16 NOTE — Telephone Encounter (Signed)
 I called and spoke to pt. Pt informed of Dr Enriqueta note and verbalized understanding. I will have a flutter device placed at the front desk for pt to pick up on Monday. Pt verbalized understanding. NFN

## 2024-09-23 ENCOUNTER — Ambulatory Visit: Admitting: Acute Care

## 2024-10-03 ENCOUNTER — Ambulatory Visit

## 2025-04-18 ENCOUNTER — Ambulatory Visit: Admitting: Neurology

## 2025-06-13 ENCOUNTER — Ambulatory Visit
